# Patient Record
Sex: Male | Born: 1965 | Race: Black or African American | Hispanic: No | Marital: Married | Smoking: Never smoker
Health system: Southern US, Community
[De-identification: ages and names within clinical notes are randomized; demographics above are authoritative.]

## PROBLEM LIST (undated history)

## (undated) DIAGNOSIS — I10 Essential (primary) hypertension: Secondary | ICD-10-CM

## (undated) DIAGNOSIS — E119 Type 2 diabetes mellitus without complications: Secondary | ICD-10-CM

## (undated) DIAGNOSIS — E785 Hyperlipidemia, unspecified: Secondary | ICD-10-CM

## (undated) HISTORY — DX: Hyperlipidemia, unspecified: E78.5

## (undated) HISTORY — DX: Type 2 diabetes mellitus without complications: E11.9

## (undated) HISTORY — DX: Essential (primary) hypertension: I10

---

## 2002-06-03 ENCOUNTER — Emergency Department (HOSPITAL_COMMUNITY): Admission: EM | Admit: 2002-06-03 | Discharge: 2002-06-03 | Payer: Self-pay | Admitting: Emergency Medicine

## 2002-06-14 ENCOUNTER — Emergency Department (HOSPITAL_COMMUNITY): Admission: EM | Admit: 2002-06-14 | Discharge: 2002-06-14 | Payer: Self-pay | Admitting: Emergency Medicine

## 2002-06-14 ENCOUNTER — Encounter: Payer: Self-pay | Admitting: Emergency Medicine

## 2006-06-14 ENCOUNTER — Encounter: Admission: RE | Admit: 2006-06-14 | Discharge: 2006-09-12 | Payer: Self-pay | Admitting: Internal Medicine

## 2006-12-02 ENCOUNTER — Emergency Department (HOSPITAL_COMMUNITY): Admission: EM | Admit: 2006-12-02 | Discharge: 2006-12-02 | Payer: Self-pay | Admitting: Emergency Medicine

## 2009-10-17 ENCOUNTER — Emergency Department (HOSPITAL_COMMUNITY): Admission: EM | Admit: 2009-10-17 | Discharge: 2009-10-17 | Payer: Self-pay | Admitting: Emergency Medicine

## 2011-03-24 LAB — CBC
HCT: 46.5 % (ref 39.0–52.0)
Hemoglobin: 16.3 g/dL (ref 13.0–17.0)
MCHC: 35 g/dL (ref 30.0–36.0)
MCV: 89 fL (ref 78.0–100.0)
Platelets: 223 10*3/uL (ref 150–400)
RBC: 5.23 MIL/uL (ref 4.22–5.81)
RDW: 13.2 % (ref 11.5–15.5)
WBC: 7.9 10*3/uL (ref 4.0–10.5)

## 2011-03-24 LAB — COMPREHENSIVE METABOLIC PANEL
ALT: 24 U/L (ref 0–53)
AST: 25 U/L (ref 0–37)
Albumin: 4.3 g/dL (ref 3.5–5.2)
Alkaline Phosphatase: 66 U/L (ref 39–117)
BUN: 14 mg/dL (ref 6–23)
CO2: 32 mEq/L (ref 19–32)
Calcium: 9.9 mg/dL (ref 8.4–10.5)
Chloride: 91 mEq/L — ABNORMAL LOW (ref 96–112)
Creatinine, Ser: 1.16 mg/dL (ref 0.4–1.5)
GFR calc Af Amer: >60 mL/min (ref >60)
GFR calc non Af Amer: >60 mL/min (ref >60)
Glucose, Bld: 390 mg/dL — ABNORMAL HIGH (ref 70–99)
Potassium: 3.1 mEq/L — ABNORMAL LOW (ref 3.5–5.1)
Sodium: 135 mEq/L (ref 135–145)
Total Bilirubin: 1.1 mg/dL (ref 0.3–1.2)
Total Protein: 8.2 g/dL (ref 6.0–8.3)

## 2011-03-24 LAB — CK TOTAL AND CKMB (NOT AT ARMC)
CK, MB: 1.1 ng/mL (ref 0.3–4.0)
Relative Index: 0.7 (ref 0.0–2.5)
Total CK: 163 U/L (ref 7–232)

## 2011-03-24 LAB — URINALYSIS, ROUTINE W REFLEX MICROSCOPIC
Bilirubin Urine: NEGATIVE
Glucose, UA: >1000 mg/dL — AB
Hgb urine dipstick: NEGATIVE
Ketones, ur: 40 mg/dL — AB
Leukocytes, UA: NEGATIVE
Nitrite: NEGATIVE
Protein, ur: 30 mg/dL — AB
Specific Gravity, Urine: 1.042 — ABNORMAL HIGH (ref 1.005–1.030)
Urobilinogen, UA: 0.2 mg/dL (ref 0.0–1.0)
pH: 5.5 (ref 5.0–8.0)

## 2011-03-24 LAB — HEMOGLOBIN A1C
Hgb A1c MFr Bld: 11.7 % — ABNORMAL HIGH (ref 4.6–6.1)
Mean Plasma Glucose: 289 mg/dL

## 2011-03-24 LAB — LIPID PANEL
Cholesterol: 215 mg/dL — ABNORMAL HIGH (ref 0–200)
HDL: 45 mg/dL (ref >39)
LDL Cholesterol: 127 mg/dL — ABNORMAL HIGH (ref 0–99)
Total CHOL/HDL Ratio: 4.8 RATIO
Triglycerides: 214 mg/dL — ABNORMAL HIGH (ref <150)
VLDL: 43 mg/dL — ABNORMAL HIGH (ref 0–40)

## 2011-03-24 LAB — GLUCOSE, CAPILLARY
Glucose-Capillary: 258 mg/dL — ABNORMAL HIGH (ref 70–99)
Glucose-Capillary: 382 mg/dL — ABNORMAL HIGH (ref 70–99)

## 2011-03-24 LAB — TROPONIN I: Troponin I: 0.04 ng/mL (ref 0.00–0.06)

## 2011-03-24 LAB — URINE MICROSCOPIC-ADD ON

## 2011-03-24 LAB — PROTIME-INR
INR: 0.98 (ref 0.00–1.49)
Prothrombin Time: 12.9 seconds (ref 11.6–15.2)

## 2011-03-24 LAB — APTT: aPTT: 24 seconds (ref 24–37)

## 2015-05-20 ENCOUNTER — Ambulatory Visit (INDEPENDENT_AMBULATORY_CARE_PROVIDER_SITE_OTHER): Payer: Self-pay | Admitting: Family Medicine

## 2015-05-20 VITALS — BP 128/80 | HR 95 | Temp 98.7°F | Resp 17 | Ht 72.5 in | Wt 233.0 lb

## 2015-05-20 DIAGNOSIS — Z0000 Encounter for general adult medical examination without abnormal findings: Secondary | ICD-10-CM

## 2015-05-20 DIAGNOSIS — Z021 Encounter for pre-employment examination: Secondary | ICD-10-CM

## 2015-05-20 NOTE — Progress Notes (Signed)
[facility] [address], [place] [id number] [phone]  Date:  05/20/2015   Name:  Dylan Abbott   DOB:  August 11, 1966   MRN:  [id number]  PCP:  No primary care provider on file.    Chief Complaint: Annual Exam   History of Present Illness:  Dylan Abbott is a 49 y.o. 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  He [place] in [place]- he was last evaluated a couple of months ago per his report.    [facility] [place]. We were not able to find the phone number to call regarding his medications We think he is on metformin and lisinopril but cannot know for sure He does not use insulin He is OW healthy as far as he knows    There are no active problems to display for this patient.   History reviewed. No pertinent past medical history.  History reviewed. No pertinent past surgical history.  History  Substance Use Topics  . Smoking status: Never Smoker   . Smokeless tobacco: Not on file  . Alcohol Use: Not on file    History reviewed. No pertinent family history.  Not on File  Medication list has been reviewed and updated.  No current outpatient prescriptions on file prior to visit.   No current facility-administered medications on file prior to visit.    Review of Systems:  As per HPI- otherwise negative.   Physical Examination: Filed Vitals:   05/20/15 1341  BP: 146/92  Pulse:   Temp:   Resp:    Filed Vitals:   05/20/15 1339  Height: 6\' 8"  (2.032 m)  Weight: 233 lb 12.8 oz (106.051 kg)   Body mass index is 25.68 kg/(m^2). Ideal Body Weight: Weight in (lb) to have BMI = 25: 227.1  GEN: WDWN, NAD, Non-toxic, A & O x 3, looks well HEENT: Atraumatic, Normocephalic. Neck supple. No masses, No LAD. Ears and Nose: No external deformity. CV: RRR, No M/G/R. No JVD. No thrill. No extra  heart sounds. PULM: CTA B, no wheezes, crackles, rhonchi. No retractions. No resp. distress. No accessory muscle use. ABD: S, NT, ND, +BS. No rebound. No HSM. EXTR: No c/c/e NEURO Normal gait. Normal strength and DTR all extremities.   No inguina; hernia PSYCH: Normally interactive. Conversant. Not depressed or anxious appearing.  Calm demeanor.    Assessment and Plan: Physical exam  1 year DOT card due to controlled DM and HTN Encouraged him to find a new PCP [place]  Signed Abbe AmsterdamJessica Aurore Redinger, MD

## 2016-07-15 ENCOUNTER — Encounter: Payer: Self-pay | Admitting: Urgent Care

## 2016-07-15 ENCOUNTER — Ambulatory Visit (INDEPENDENT_AMBULATORY_CARE_PROVIDER_SITE_OTHER): Payer: Self-pay | Admitting: Urgent Care

## 2016-07-15 VITALS — BP 138/88 | HR 84 | Temp 98.3°F | Resp 17 | Ht 72.5 in | Wt 223.0 lb

## 2016-07-15 DIAGNOSIS — Z024 Encounter for examination for driving license: Secondary | ICD-10-CM

## 2016-07-15 DIAGNOSIS — E785 Hyperlipidemia, unspecified: Secondary | ICD-10-CM

## 2016-07-15 DIAGNOSIS — Z021 Encounter for pre-employment examination: Secondary | ICD-10-CM

## 2016-07-15 DIAGNOSIS — E119 Type 2 diabetes mellitus without complications: Secondary | ICD-10-CM

## 2016-07-15 DIAGNOSIS — I10 Essential (primary) hypertension: Secondary | ICD-10-CM

## 2016-07-15 LAB — POCT GLYCOSYLATED HEMOGLOBIN (HGB A1C): Hemoglobin A1C: 6.5

## 2016-07-15 NOTE — Progress Notes (Signed)
Commercial Driver Medical Examination   Dylan Abbott is a 50 y.o. male who presents today for a DOT physical exam. The patient reports HTN managed with enalipril, HL managed with Simvastatin, Diabetes managed with pioglitazone, glipizide. Denies dizziness, chronic headache, blurred vision, chest pain, shortness of breath, heart racing, palpitations, nausea, vomiting, abdominal pain, hematuria, lower leg swelling. Denies smoking cigarettes or drinking alcohol.   The following portions of the patient's history were reviewed and updated as appropriate: allergies, current medications, past family history, past medical history, past social history and past surgical history.  Objective:   BP 138/88 (BP Location: Right Arm, Patient Position: Sitting, Cuff Size: Normal)   Pulse 84   Temp 98.3 F (36.8 C) (Oral)   Resp 17   Ht 6' 0.5" (1.842 m)   Wt 223 lb (101.2 kg)   SpO2 99%   BMI 29.83 kg/m   Vision/hearing:  Visual Acuity Screening   Right eye Left eye Both eyes  Without correction: 20/20 20/20 20/20   With correction:     Hearing Screening Comments: Peripheral Vision: Right eye 85 degrees. Left eye 85 degrees. The patient can distinguish the colors red, amber and green. The patient was able to hear a forced whisper from L=10 R=10 feet.  Patient can recognize and distinguish among traffic control signals and devices showing standard red, green, and amber colors.  Corrective lenses required: No  Monocular Vision?: No  Hearing aid requirement: No  Physical Exam  Constitutional: He is oriented to person, place, and time. He appears well-developed and well-nourished.  HENT:  TM's intact bilaterally, no effusions or erythema. Nasal turbinates pink and moist, nasal passages patent. No sinus tenderness. Oropharynx clear, mucous membranes moist, dentition in good repair.  Eyes: Conjunctivae and EOM are normal. Pupils are equal, round, and reactive to light. Right eye exhibits no  discharge. Left eye exhibits no discharge. No scleral icterus.  Neck: Normal range of motion. Neck supple. No thyromegaly present.  Cardiovascular: Normal rate, regular rhythm and intact distal pulses.  Exam reveals no gallop and no friction rub.   No murmur heard. Pulmonary/Chest: No stridor. No respiratory distress. He has no wheezes. He has no rales.  Abdominal: Soft. Bowel sounds are normal. He exhibits no distension and no mass. There is no tenderness.  Musculoskeletal: Normal range of motion. He exhibits no edema or tenderness.  Lymphadenopathy:    He has no cervical adenopathy.  Neurological: He is alert and oriented to person, place, and time. He has normal reflexes.  Skin: Skin is warm and dry. No rash noted. No erythema. No pallor.  Psychiatric: He has a normal mood and affect.   Labs: Comments: SPGR:1.025,GLU:neg,BLOOD:neg,PRO:neg  Results for orders placed or performed in visit on 07/15/16 (from the past 24 hour(s))  POCT glycosylated hemoglobin (Hb A1C)     Status: None   Collection Time: 07/15/16  6:13 PM  Result Value Ref Range   Hemoglobin A1C 6.5     Assessment:    Healthy male exam.  Meets standards, but periodic monitoring required due to HTN, diabetes, HL.  Driver qualified only for 1 year.    Plan:   Medical examiners certificate completed and printed. Return as needed.

## 2016-07-15 NOTE — Patient Instructions (Addendum)
Keeping you healthy  Get these tests  Blood pressure- Have your blood pressure checked once a year by your healthcare provider.  Normal blood pressure is 120/80  Weight- Have your body mass index (BMI) calculated to screen for obesity.  BMI is a measure of body fat based on height and weight. You can also calculate your own BMI at [email or web address].com/bmi/.  Cholesterol- Have your cholesterol checked every year.  Diabetes- Have your blood sugar checked regularly if you have high blood pressure, high cholesterol, have a family history of diabetes or if you are overweight.  Screening for Colon Cancer- Colonoscopy starting at age 50.  Screening may begin sooner depending on your family history and other health conditions. Follow up colonoscopy as directed by your Gastroenterologist.  Screening for Prostate Cancer- Both blood work (PSA) and a rectal exam help screen for Prostate Cancer.  Screening begins at age 40 with African-American men and at age 50 with Caucasian men.  Screening may begin sooner depending on your family history.  Take these medicines  Aspirin- One aspirin daily can help prevent Heart disease and Stroke.  Flu shot- Every fall.  Tetanus- Every 10 years.  Zostavax- Once after the age of 60 to prevent Shingles.  Pneumonia shot- Once after the age of 65; if you are younger than 65, ask your healthcare provider if you need a Pneumonia shot.  Take these steps  Don't smoke- If you do smoke, talk to your doctor about quitting.  For tips on how to quit, go to www.smokefree.gov or call 1-800-QUIT-NOW.  Be physically active- Exercise 5 days a week for at least 30 minutes.  If you are not already physically active start slow and gradually work up to 30 minutes of moderate physical activity.  Examples of moderate activity include walking briskly, mowing the yard, dancing, swimming, bicycling, etc.  Eat a healthy diet- Eat a variety of healthy food such as fruits, vegetables, low  fat milk, low fat cheese, yogurt, lean meant, poultry, fish, beans, tofu, etc. For more information go to www.thenutritionsource.org  Drink alcohol in moderation- Limit alcohol intake to less than two drinks a day. Never drink and drive.  Dentist- Brush and floss twice daily; visit your dentist twice a year.  Depression- Your emotional health is as important as your physical health. If you're feeling down, or losing interest in things you would normally enjoy please talk to your healthcare provider.  Eye exam- Visit your eye doctor every year.  Safe sex- If you may be exposed to a sexually transmitted infection, use a condom.  Seat belts- Seat belts can save your life; always wear one.  Smoke/Carbon Monoxide detectors- These detectors need to be installed on the appropriate level of your home.  Replace batteries at least once a year.  Skin cancer- When out in the sun, cover up and use sunscreen 15 SPF or higher.  Violence- If anyone is threatening you, please tell your healthcare provider.  Living Will/ Health care power of attorney- Speak with your healthcare provider and family.    IF you received an x-ray today, you will receive an invoice from [facility]. Please contact  Radiology at [phone] with questions or concerns regarding your invoice.   IF you received labwork today, you will receive an invoice from [facility]. Please contact [facility] at [phone] with questions or concerns regarding your invoice.   Our billing staff will not be able to assist you with questions regarding bills from these companies.    You will be contacted with the lab results as soon as they are available. The fastest way to get your results is to activate your My Chart account. Instructions are located on the last page of this paperwork. If you have not heard from us regarding the results in 2 weeks, please contact this office.      

## 2016-07-21 NOTE — Progress Notes (Signed)
Medical screening examination/treatment/procedure(s) were performed by a resident physician or non-physician practitioner and as the supervising physician I was immediately available for consultation/collaboration.  Mariette Cowley, MD  

## 2017-11-13 DIAGNOSIS — E119 Type 2 diabetes mellitus without complications: Secondary | ICD-10-CM | POA: Insufficient documentation

## 2017-11-13 DIAGNOSIS — E785 Hyperlipidemia, unspecified: Secondary | ICD-10-CM | POA: Insufficient documentation

## 2017-11-13 DIAGNOSIS — I10 Essential (primary) hypertension: Secondary | ICD-10-CM | POA: Insufficient documentation

## 2018-08-21 ENCOUNTER — Ambulatory Visit (INDEPENDENT_AMBULATORY_CARE_PROVIDER_SITE_OTHER): Payer: PRIVATE HEALTH INSURANCE | Admitting: Family Medicine

## 2018-08-21 ENCOUNTER — Encounter: Payer: Self-pay | Admitting: Family Medicine

## 2018-08-21 ENCOUNTER — Other Ambulatory Visit: Payer: Self-pay

## 2018-08-21 VITALS — BP 152/83 | HR 68 | Temp 98.0°F | Resp 16 | Ht 72.24 in | Wt 241.0 lb

## 2018-08-21 DIAGNOSIS — E119 Type 2 diabetes mellitus without complications: Secondary | ICD-10-CM

## 2018-08-21 DIAGNOSIS — I10 Essential (primary) hypertension: Secondary | ICD-10-CM

## 2018-08-21 DIAGNOSIS — R7989 Other specified abnormal findings of blood chemistry: Secondary | ICD-10-CM

## 2018-08-21 DIAGNOSIS — E785 Hyperlipidemia, unspecified: Secondary | ICD-10-CM

## 2018-08-21 DIAGNOSIS — R9431 Abnormal electrocardiogram [ECG] [EKG]: Secondary | ICD-10-CM

## 2018-08-21 LAB — POCT GLYCOSYLATED HEMOGLOBIN (HGB A1C): Hemoglobin A1C: 8.5 % — AB (ref 4.0–5.6)

## 2018-08-21 MED ORDER — PIOGLITAZONE HCL 15 MG PO TABS: 15.0000 mg | ORAL_TABLET | Freq: Every day | ORAL | 1 refills | Status: DC

## 2018-08-21 MED ORDER — GLIPIZIDE ER 10 MG PO TB24: 10.0000 mg | ORAL_TABLET | Freq: Every day | ORAL | 1 refills | Status: DC

## 2018-08-21 MED ORDER — METFORMIN HCL 1000 MG PO TABS: ORAL_TABLET | ORAL | 1 refills | Status: DC

## 2018-08-21 MED ORDER — NIFEDIPINE ER OSMOTIC RELEASE 90 MG PO TB24: 90.0000 mg | ORAL_TABLET | Freq: Every day | ORAL | 1 refills | Status: DC

## 2018-08-21 MED ORDER — SIMVASTATIN 40 MG PO TABS: 40.0000 mg | ORAL_TABLET | Freq: Every day | ORAL | 1 refills | Status: DC

## 2018-08-21 MED ORDER — METFORMIN HCL 500 MG PO TABS: ORAL_TABLET | ORAL | 1 refills | Status: DC

## 2018-08-21 MED ORDER — ENALAPRIL MALEATE 10 MG PO TABS: 10.0000 mg | ORAL_TABLET | Freq: Every day | ORAL | 0 refills | Status: DC

## 2018-08-21 NOTE — Progress Notes (Signed)
Subjective:  By signing my name below, I, Dylan Abbott, attest that this documentation has been prepared under the direction and in the presence of Shade Flood, MD Electronically Signed: Charline Bills, ED Scribe 08/21/2018 at 12:35 PM.   Patient ID: Dylan Abbott, male    DOB: 11-23-1966, 52 y.o.   MRN: [id number]  Chief Complaint  Patient presents with  . Establish Care    pt need a pcp to manage his DM  . Diabetes   HPI Dylan Abbott is a 52 y.o. male who presents to Primary Care at [facility] to establish care. H/o DM. Last A1C from [facility] in 2017 7.3, down to 6.5 in 06/2016. H/o hyperlipidemia and HTN. Pt is fasting at this visit. 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   DM Pt recently moved here from [place]. States he last had his blood glucose monitored ~3 months ago. Takes metformin 500 mg bid, glipizide 10 mg qd, Actos 15 mg qd. He is low on meds but has not missed any doses yet. Denies any side-effects. Last saw optho: 07/2017. Denies h/o retinopathy.  HTN Enalapril 10 mg qd and procardia 10 mg tid. - Denies any side-effects.  Hyperlipidemia Takes zocor 10 mg qd.  H/o Low Vit D Unknown reading. Pt has been on 50,000 units/wk x 3 months.  Abnormal EKG At DOT physical on 8/15. Sinus rhythm with first degree block. Elevation with non-specific ST changes V2-V5, appears to be slightly more prominent than in 2018. Denies cp on 8/15, at this time or with doing construction for work, chest tightness, sob, lightheadedness or dizziness.  There are no active problems to display for this patient.  Past Medical History:  Diagnosis Date  . Diabetes mellitus without complication (HCC)    History reviewed. No pertinent surgical history. No Known Allergies Prior to Admission medications   Medication Sig Start Date End Date Taking? Authorizing Provider  enalapril (VASOTEC) 10 MG tablet Take 10 mg by mouth daily.    [provider]  glipiZIDE (GLUCOTROL XL) 10 MG 24 hr tablet Take 10 mg by mouth daily with breakfast.    [provider]  NIFEdipine (PROCARDIA) 10 MG capsule Take 10 mg by mouth 3 (three) times daily.    [provider]  pioglitazone (ACTOS) 15 MG tablet Take 15 mg by mouth daily.    [provider]  simvastatin (ZOCOR) 10 MG tablet Take 10 mg by mouth daily.    [provider]   Social History   Socioeconomic History  . Marital status: Married    Spouse name: Not on file  . Number of children: Not on file  . Years of education: Not on file  . Highest education level: Not on file  Occupational History  . Not on file  Social Needs  . Financial resource strain: Not on file  . Food insecurity:    Worry: Not on file    Inability: Not on file  . Transportation needs:    Medical: Not on file    Non-medical: Not on file  Tobacco Use  . Smoking status: Never Smoker  . Smokeless tobacco: Never Used  Substance and Sexual Activity  . Alcohol use: Not on file  . Drug use: Not on file  . Sexual activity: Not on file  Lifestyle  . Physical activity:    Days per week: Not on file    Minutes per session: Not on file  .  Stress: Not on file  Relationships  . Social connections:    Talks on phone: Not on file    Gets together: Not on file    Attends religious service: Not on file    Active member of club or organization: Not on file    Attends meetings of clubs or organizations: Not on file    Relationship status: Not on file  . Intimate partner violence:    Fear of current or ex partner: Not on file    Emotionally abused: Not on file    Physically abused: Not on file    Forced sexual activity: Not on file  Other Topics Concern  . Not on file  Social History Narrative  . Not on file   Review of Systems  Constitutional: Negative for fatigue and unexpected weight change.  Eyes: Negative for visual disturbance.  Respiratory: Negative for cough, chest  tightness and shortness of breath.   Cardiovascular: Negative for chest pain, palpitations and leg swelling.  Gastrointestinal: Negative for abdominal pain and blood in stool.  Musculoskeletal: Negative for myalgias.  Neurological: Negative for dizziness, light-headedness and headaches.      Objective:   Physical Exam  Constitutional: He is oriented to person, place, and time. He appears well-developed and well-nourished.  HENT:  Head: Normocephalic and atraumatic.  Eyes: Pupils are equal, round, and reactive to light. EOM are normal.  Neck: No JVD present. Carotid bruit is not present.  Cardiovascular: Normal rate, regular rhythm and normal heart sounds.  No murmur heard. Pulmonary/Chest: Effort normal and breath sounds normal. He has no rales.  Musculoskeletal: He exhibits no edema.  Neurological: He is alert and oriented to person, place, and time.  Skin: Skin is warm and dry.  Psychiatric: He has a normal mood and affect.  Vitals reviewed.  Vitals:   08/21/18 1234  BP: (!) 152/83  Pulse: 68  Resp: 16  Temp: 98 F (36.7 C)  TempSrc: Oral  SpO2: 97%  Weight: 241 lb (109.3 kg)  Height: 6' 0.24" (1.835 m)   Results for orders placed or performed in visit on 08/21/18  POCT glycosylated hemoglobin (Hb A1C)  Result Value Ref Range   Hemoglobin A1C 8.5 (A) 4.0 - 5.6 %   HbA1c POC (<> result, manual entry)     HbA1c, POC (prediabetic range)     HbA1c, POC (controlled diabetic range)        Assessment & Plan:   Dylan Abbott is a 52 y.o. male Type 2 diabetes mellitus without complication, without long-term current use of insulin (HCC) - Plan: Comprehensive metabolic panel, Microalbumin/Creatinine Ratio, Urine, glipiZIDE (GLUCOTROL XL) 10 MG 24 hr tablet, Ambulatory referral to Ophthalmology, POCT glycosylated hemoglobin (Hb A1C), metFORMIN (GLUCOPHAGE) 1000 MG tablet, DISCONTINUED: metFORMIN (GLUCOPHAGE) 500 MG tablet  -Uncontrolled, will increase metformin to 1000 mg  twice daily as he reportedly had tolerated that dose previously.  Obtain records from previous provider and follow-up in 1 month.  No change glipizide or actos at this time.   Low vitamin D level - Plan: Vitamin D, 25-hydroxy  -Status post prescription dosing, check level to see if that is needed at this time.  2000 units/day may be sufficient.  Essential hypertension - Plan: enalapril (VASOTEC) 10 MG tablet, NIFEdipine (PROCARDIA XL) 90 MG 24 hr tablet  -Borderline elevated, plan for recheck at next visit.  Continue same regimen for now, previous records request  Hyperlipidemia, unspecified hyperlipidemia type - Plan: Lipid panel, Comprehensive metabolic panel, simvastatin (ZOCOR) 40  MG tablet  -Tolerating statin, check labs, continue same dose  Nonspecific abnormal electrocardiogram (ECG) (EKG) - Plan: Ambulatory referral to Cardiology  -Nonspecific ST changes precordial leads, denies chest pain or symptoms.  Appears similar but possibly more prominent compared to 2018 EKG.  Will refer to cardiology to discuss further work-up if needed given history of diabetes, hyperlipidemia, hypertension.  ER/911 precautions discussed if symptoms.   Meds ordered this encounter  Medications  . enalapril (VASOTEC) 10 MG tablet    Sig: Take 1 tablet (10 mg total) by mouth daily.    Dispense:  90 tablet    Refill:  0  . glipiZIDE (GLUCOTROL XL) 10 MG 24 hr tablet    Sig: Take 1 tablet (10 mg total) by mouth daily with breakfast.    Dispense:  90 tablet    Refill:  1  . DISCONTD: metFORMIN (GLUCOPHAGE) 500 MG tablet    Sig: 1 tablet twice a day by oral route    Dispense:  180 tablet    Refill:  1  . simvastatin (ZOCOR) 40 MG tablet    Sig: Take 1 tablet (40 mg total) by mouth daily at 6 PM.    Dispense:  90 tablet    Refill:  1  . NIFEdipine (PROCARDIA XL) 90 MG 24 hr tablet    Sig: Take 1 tablet (90 mg total) by mouth daily.    Dispense:  90 tablet    Refill:  1  . metFORMIN (GLUCOPHAGE) 1000 MG  tablet    Sig: 1 tablet twice a day by oral route    Dispense:  180 tablet    Refill:  1   Patient Instructions    Thank you for coming in today. We will try to obtain records from prior provider to review at next visit in 1 month. No med changes for now. A1c slightly high - can increase metformin back to 1000mg  twice per day for now   I referred you to eye specialist.   I will refer you to cardiologist to discuss the EKG. If any chest pains - call 911/go to emergency room.   I will check vitamin D level to see if prescription dose is still needed. Ok to take 2000 units vitamin D over the counter daily for now.   If you have lab work done today you will be contacted with your lab results within the next 2 weeks.  If you have not heard from [place] then please contact us. The fastest way to get your results is to register for My Chart.    IF you received an x-ray today, you will receive an invoice from [facility]. Please contact [facility] at [phone] with questions or concerns regarding your invoice.   IF you received labwork today, you will receive an invoice from [facility]. Please contact [facility] at [phone] with questions or concerns regarding your invoice.   Our billing staff will not be able to assist you with questions regarding bills from these companies.  You will be contacted with the lab results as soon as they are available. The fastest way to get your results is to activate your My Chart account. Instructions are located on the last page of this paperwork. If you have not heard from [place] regarding the results in 2 weeks, please contact this office.       I personally performed the services described in this documentation, which was scribed in my presence. The recorded information has been reviewed and considered  for accuracy and completeness, addended by me as needed, and agree with information above.  Signed,   Meredith Staggers, MD Primary Care at  [facility].  08/21/18 1:28 PM

## 2018-08-21 NOTE — Patient Instructions (Addendum)
  Thank you for coming in today. We will try to obtain records from prior provider to review at next visit in 1 month. No med changes for now. A1c slightly high - can increase metformin back to 1000mg  twice per day for now   I referred you to eye specialist.   I will refer you to cardiologist to discuss the EKG. If any chest pains - call 911/go to emergency room.   I will check vitamin D level to see if prescription dose is still needed. Ok to take 2000 units vitamin D over the counter daily for now.   If you have lab work done today you will be contacted with your lab results within the next 2 weeks.  If you have not heard from [place] then please contact us. The fastest way to get your results is to register for My Chart.    IF you received an x-ray today, you will receive an invoice from [facility]. Please contact [facility] at [phone] with questions or concerns regarding your invoice.   IF you received labwork today, you will receive an invoice from [place]. Please contact [facility] at [phone] with questions or concerns regarding your invoice.   Our billing staff will not be able to assist you with questions regarding bills from these companies.  You will be contacted with the lab results as soon as they are available. The fastest way to get your results is to activate your My Chart account. Instructions are located on the last page of this paperwork. If you have not heard from [place] regarding the results in 2 weeks, please contact this office.

## 2018-08-22 LAB — VITAMIN D 25 HYDROXY (VIT D DEFICIENCY, FRACTURES): Vit D, 25-Hydroxy: 44.5 ng/mL (ref 30.0–100.0)

## 2018-08-22 LAB — COMPREHENSIVE METABOLIC PANEL
ALT: 17 IU/L (ref 0–44)
AST: 21 IU/L (ref 0–40)
Albumin/Globulin Ratio: 1.6 (ref 1.2–2.2)
Albumin: 4.9 g/dL (ref 3.5–5.5)
Alkaline Phosphatase: 43 IU/L (ref 39–117)
BUN/Creatinine Ratio: 17 (ref 9–20)
BUN: 23 mg/dL (ref 6–24)
Bilirubin Total: 0.4 mg/dL (ref 0.0–1.2)
CO2: 25 mmol/L (ref 20–29)
Calcium: 10.2 mg/dL (ref 8.7–10.2)
Chloride: 98 mmol/L (ref 96–106)
Creatinine, Ser: 1.35 mg/dL — ABNORMAL HIGH (ref 0.76–1.27)
GFR calc Af Amer: 69 mL/min/{1.73_m2} (ref >59)
GFR calc non Af Amer: 60 mL/min/{1.73_m2} (ref >59)
Globulin, Total: 3.1 g/dL (ref 1.5–4.5)
Glucose: 89 mg/dL (ref 65–99)
Potassium: 3.3 mmol/L — ABNORMAL LOW (ref 3.5–5.2)
Sodium: 141 mmol/L (ref 134–144)
Total Protein: 8 g/dL (ref 6.0–8.5)

## 2018-08-22 LAB — LIPID PANEL
Chol/HDL Ratio: 2.2 ratio (ref 0.0–5.0)
Cholesterol, Total: 155 mg/dL (ref 100–199)
HDL: 72 mg/dL (ref >39)
LDL Calculated: 72 mg/dL (ref 0–99)
Triglycerides: 54 mg/dL (ref 0–149)
VLDL Cholesterol Cal: 11 mg/dL (ref 5–40)

## 2018-08-22 LAB — MICROALBUMIN / CREATININE URINE RATIO
Creatinine, Urine: 203.5 mg/dL
Microalb/Creat Ratio: 29.7 mg/g creat (ref 0.0–30.0)
Microalbumin, Urine: 60.5 ug/mL

## 2018-12-06 NOTE — Progress Notes (Deleted)
Referring-Dylan Neva SeatGreene, MD Reason for referral-abnormal electrocardiogram  HPI: 52 year old male for evaluation of abnormal electrocardiogram at request of Dylan KinnierJeffrey Green, MD. Electrocardiogram described as sinus with first-degree AV block and nonspecific changes in V2 through V5.  Tracing not available.  Current Outpatient Medications  Medication Sig Dispense Refill  . enalapril (VASOTEC) 10 MG tablet Take 1 tablet (10 mg total) by mouth daily. 90 tablet 0  . glipiZIDE (GLUCOTROL XL) 10 MG 24 hr tablet Take 1 tablet (10 mg total) by mouth daily with breakfast. 90 tablet 1  . metFORMIN (GLUCOPHAGE) 1000 MG tablet 1 tablet twice a day by oral route 180 tablet 1  . NIFEdipine (PROCARDIA XL) 90 MG 24 hr tablet Take 1 tablet (90 mg total) by mouth daily. 90 tablet 1  . pioglitazone (ACTOS) 15 MG tablet Take 1 tablet (15 mg total) by mouth daily. 90 tablet 1  . simvastatin (ZOCOR) 40 MG tablet Take 1 tablet (40 mg total) by mouth daily at 6 PM. 90 tablet 1   No current facility-administered medications for this visit.     No Known Allergies  Past Medical History:  Diagnosis Date  . Diabetes mellitus without complication (HCC)   . Hyperlipidemia   . Hypertension     No past surgical history on file.  Social History   Socioeconomic History  . Marital status: Married    Spouse name: Not on file  . Number of children: Not on file  . Years of education: Not on file  . Highest education level: Not on file  Occupational History  . Not on file  Social Needs  . Financial resource strain: Not on file  . Food insecurity:    Worry: Not on file    Inability: Not on file  . Transportation needs:    Medical: Not on file    Non-medical: Not on file  Tobacco Use  . Smoking status: Never Smoker  . Smokeless tobacco: Never Used  Substance and Sexual Activity  . Alcohol use: Not on file  . Drug use: Not on file  . Sexual activity: Not on file  Lifestyle  . Physical activity:   Days per week: Not on file    Minutes per session: Not on file  . Stress: Not on file  Relationships  . Social connections:    Talks on phone: Not on file    Gets together: Not on file    Attends religious service: Not on file    Active member of club or organization: Not on file    Attends meetings of clubs or organizations: Not on file    Relationship status: Not on file  . Intimate partner violence:    Fear of current or ex partner: Not on file    Emotionally abused: Not on file    Physically abused: Not on file    Forced sexual activity: Not on file  Other Topics Concern  . Not on file  Social History Narrative  . Not on file    No family history on file.  ROS: no fevers or chills, productive cough, hemoptysis, dysphasia, odynophagia, melena, hematochezia, dysuria, hematuria, rash, seizure activity, orthopnea, PND, pedal edema, claudication. Remaining systems are negative.  Physical Exam:   There were no vitals taken for this visit.  General:  Well developed/well nourished in NAD Skin warm/dry Patient not depressed No peripheral clubbing Back-normal HEENT-normal/normal eyelids Neck supple/normal carotid upstroke bilaterally; no bruits; no JVD; no thyromegaly chest - CTA/ normal expansion  CV - RRR/normal S1 and S2; no murmurs, rubs or gallops;  PMI nondisplaced Abdomen -NT/ND, no HSM, no mass, + bowel sounds, no bruit 2+ femoral pulses, no bruits Ext-no edema, chords, 2+ DP Neuro-grossly nonfocal  ECG - personally reviewed  A/P  1  Dylan MillersBrian Sherica Paternostro, MD

## 2018-12-18 ENCOUNTER — Ambulatory Visit: Payer: Self-pay | Admitting: Cardiology

## 2018-12-25 ENCOUNTER — Telehealth: Payer: Self-pay | Admitting: Family Medicine

## 2018-12-25 ENCOUNTER — Telehealth: Payer: Self-pay

## 2018-12-25 NOTE — Telephone Encounter (Signed)
Spoke to [place] at [facility] requested all EKG's to be faxed before patient's appointment with Dr.Jordan 12/26/18.

## 2018-12-25 NOTE — Progress Notes (Signed)
Cardiology Office Note   Date:  12/26/2018   ID:  Dylan Latlexander Mole, DOB Oct 16, 1966, MRN [id number]  PCP:  Shade FloodGreene, Jeffrey R, MD  Cardiologist:   Peter SwazilandJordan, MD   Chief Complaint  Patient presents with  . Follow-up    ABN EKG.      History of Present Illness: Dylan Abbott is a 53 y.o. male who is seen at the request of Dr. Neva SeatGreene for evaluation of abnormal Ecg. He has a history of DM type 2, HTN, and HLD. The patient reports he had an Ecg as part of a job physical 1-2 years ago. Recommended follow up with primary care. Saw Dr Neva SeatGreene recently and Ecg was noted to be abnormal with some T wave changes more prominent in the precordial leads. Unfortunately I do not have either of these tracings.  The patient denies any cardiac problems. He specifically denies any dyspnea, chest pain, palpitations, edema, PND, claudication. He has a history of DM, HTN, and hypercholesterolemia. Admits his diet is not good and eats pretty much anything.     Past Medical History:  Diagnosis Date  . Diabetes mellitus without complication (HCC)   . Hyperlipidemia   . Hypertension     History reviewed. No pertinent surgical history.   Current Outpatient Medications  Medication Sig Dispense Refill  . enalapril (VASOTEC) 10 MG tablet Take 1 tablet (10 mg total) by mouth daily. 90 tablet 0  . glipiZIDE (GLUCOTROL XL) 10 MG 24 hr tablet Take 1 tablet (10 mg total) by mouth daily with breakfast. 90 tablet 1  . metFORMIN (GLUCOPHAGE) 1000 MG tablet 1 tablet twice a day by oral route 180 tablet 1  . NIFEdipine (PROCARDIA XL) 90 MG 24 hr tablet Take 1 tablet (90 mg total) by mouth daily. 90 tablet 1  . pioglitazone (ACTOS) 15 MG tablet Take 1 tablet (15 mg total) by mouth daily. 90 tablet 1  . simvastatin (ZOCOR) 40 MG tablet Take 1 tablet (40 mg total) by mouth daily at 6 PM. 90 tablet 1   No current facility-administered medications for this visit.     Allergies:   Patient has no known allergies.     Social History:  The patient  reports that he has never smoked. He has never used smokeless tobacco.   Family History:  The patient's family history is negative for cardiovascular disease.    ROS:  Please see the history of present illness.   Otherwise, review of systems are positive for none.   All other systems are reviewed and negative.    PHYSICAL EXAM: VS:  BP 130/78 (BP Location: Left Arm, Patient Position: Sitting, Cuff Size: Large)   Pulse 68   Ht 6' (1.829 m)   Wt 245 lb (111.1 kg)   BMI 33.23 kg/m  , BMI Body mass index is 33.23 kg/m. GEN: Well nourished, well developed BM, in no acute distress  HEENT: normal  Neck: no JVD, carotid bruits, or masses Cardiac: RRR; no murmurs, rubs, or gallops,no edema  Respiratory:  clear to auscultation bilaterally, normal work of breathing GI: soft, nontender, nondistended, + BS MS: no deformity or atrophy  Skin: warm and dry, no rash Neuro:  Strength and sensation are intact Psych: euthymic mood, full affect   EKG:  EKG is ordered today. The ekg ordered today demonstrates NSR rate 68 with first degree AV block. There is mild ST elevation with some T wave abnormality particularly in leads V2-6. I have personally reviewed and interpreted  this study.    Recent Labs: 08/21/2018: ALT 17; BUN 23; Creatinine, Ser 1.35; Potassium 3.3; Sodium 141    Lipid Panel    Component Value Date/Time   CHOL 155 08/21/2018 1417   TRIG 54 08/21/2018 1417   HDL 72 08/21/2018 1417   CHOLHDL 2.2 08/21/2018 1417   CHOLHDL 4.8 10/17/2009 0639   VLDL 43 (H) 10/17/2009 0639   LDLCALC 72 08/21/2018 1417      Wt Readings from Last 3 Encounters:  12/26/18 245 lb (111.1 kg)  08/21/18 241 lb (109.3 kg)  07/15/16 223 lb (101.2 kg)      Other studies Reviewed: Additional studies/ records that were reviewed today include:none   ASSESSMENT AND PLAN:  1.  Abnormal Ecg. I suspect this is an early repolarization pattern and therefore a normal  variant. Although he has a number of CV risk factors he is completely asymptomatic. For this reason I do not feel that further evaluation is needed. Continue to work on risk factor modification. If he should develop cardiac symptoms such as chest pain or SOB in the future we will need to reevaluate.  2. HTN controlled 3. HLD under good control on statin 4. DM type 2. Last A1c 8.5. needs to do better with dietary modification. Discussed low carb diet. Follow up with primary care. 5. CKD last creatinine 1.35 probably related to DM and HTN. Follow up with primary care.    Current medicines are reviewed at length with the patient today.  The patient does not have concerns regarding medicines.  The following changes have been made:  no change  Labs/ tests ordered today include:  No orders of the defined types were placed in this encounter.    Disposition:   FU PRN  Signed, Peter Swaziland, MD  12/26/2018 5:21 PM    [facility] [address] [place], [place], [address] Phone [phone], Fax [phone]

## 2018-12-25 NOTE — Telephone Encounter (Signed)
Copied from CRM [id number]. Topic: Referral - Medical Records >> Dec 25, 2018  9:53 AM Maia Petties wrote: Reason for CRM: Please send copies of all EKGs as none are in Epic. Dr. Neva Seat referred pt to cardiology. Please fax to # [phone] >> Dec 25, 2018  4:13 PM Tamela Oddi wrote: Elnita Maxwell, from Dr. Elvis Coil office called again requesting copies of patient's EKG.  Please fax to [phone] before patient's appointment tomorrow at 3:30, if possible.

## 2018-12-25 NOTE — Telephone Encounter (Signed)
Pt is scheduled with [facility] for 12/26/2018 but they are requesting pt abnormal ekg which the only ekg that was seen was back in 2010. They may not see pt until they have an ekg to go by..   Please advise

## 2018-12-26 ENCOUNTER — Ambulatory Visit (INDEPENDENT_AMBULATORY_CARE_PROVIDER_SITE_OTHER): Payer: 59 | Admitting: Cardiology

## 2018-12-26 ENCOUNTER — Encounter: Payer: Self-pay | Admitting: Cardiology

## 2018-12-26 VITALS — BP 130/78 | HR 68 | Ht 72.0 in | Wt 245.0 lb

## 2018-12-26 DIAGNOSIS — R9431 Abnormal electrocardiogram [ECG] [EKG]: Secondary | ICD-10-CM | POA: Diagnosis not present

## 2018-12-26 DIAGNOSIS — I10 Essential (primary) hypertension: Secondary | ICD-10-CM | POA: Diagnosis not present

## 2018-12-26 DIAGNOSIS — N182 Chronic kidney disease, stage 2 (mild): Secondary | ICD-10-CM | POA: Diagnosis not present

## 2018-12-26 DIAGNOSIS — E118 Type 2 diabetes mellitus with unspecified complications: Secondary | ICD-10-CM | POA: Diagnosis not present

## 2018-12-26 NOTE — Telephone Encounter (Signed)
Dylan Abbott calling back to check status of EKG fax stating that office needs them faxed before 3:30 today. Cheryl disconnected call while on hold with office.

## 2018-12-26 NOTE — Telephone Encounter (Signed)
I do not see it when reviewing media tab, or care everywhere. Was seen by cardiology today with repeat EKG.

## 2018-12-26 NOTE — Telephone Encounter (Signed)
No EKG in Epic.  Reviewed appt r/t referral.  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  Possibly had paperwork with him showing previous EKG.  Attempted to call pt to clarify.  Number disconnected.  Spoke with Elnita Maxwell at cardiology.  If medical records brought in from previous exams, would have been sent to scan.

## 2019-03-09 ENCOUNTER — Ambulatory Visit: Payer: PRIVATE HEALTH INSURANCE | Admitting: Family Medicine

## 2019-03-09 ENCOUNTER — Other Ambulatory Visit: Payer: Self-pay

## 2019-03-09 ENCOUNTER — Encounter: Payer: Self-pay | Admitting: Family Medicine

## 2019-03-09 VITALS — BP 160/94 | HR 70 | Temp 98.0°F | Resp 18 | Ht 72.0 in | Wt 242.4 lb

## 2019-03-09 DIAGNOSIS — E785 Hyperlipidemia, unspecified: Secondary | ICD-10-CM

## 2019-03-09 DIAGNOSIS — I10 Essential (primary) hypertension: Secondary | ICD-10-CM | POA: Diagnosis not present

## 2019-03-09 DIAGNOSIS — E119 Type 2 diabetes mellitus without complications: Secondary | ICD-10-CM | POA: Diagnosis not present

## 2019-03-09 LAB — POCT GLYCOSYLATED HEMOGLOBIN (HGB A1C): Hemoglobin A1C: 9 % — AB (ref 4.0–5.6)

## 2019-03-09 MED ORDER — PIOGLITAZONE HCL 15 MG PO TABS: 15.0000 mg | ORAL_TABLET | Freq: Every day | ORAL | 1 refills | Status: DC

## 2019-03-09 MED ORDER — EXENATIDE ER 2 MG/0.85ML ~~LOC~~ AUIJ: 2.0000 mg | AUTO-INJECTOR | SUBCUTANEOUS | 3 refills | Status: DC

## 2019-03-09 MED ORDER — METFORMIN HCL 1000 MG PO TABS: ORAL_TABLET | ORAL | 1 refills | Status: DC

## 2019-03-09 MED ORDER — SIMVASTATIN 40 MG PO TABS: 40.0000 mg | ORAL_TABLET | Freq: Every day | ORAL | 1 refills | Status: DC

## 2019-03-09 MED ORDER — ENALAPRIL MALEATE 10 MG PO TABS: 10.0000 mg | ORAL_TABLET | Freq: Every day | ORAL | 1 refills | Status: DC

## 2019-03-09 MED ORDER — GLIPIZIDE ER 10 MG PO TB24: 10.0000 mg | ORAL_TABLET | Freq: Two times a day (BID) | ORAL | 1 refills | Status: DC

## 2019-03-09 MED ORDER — NIFEDIPINE ER OSMOTIC RELEASE 90 MG PO TB24: 90.0000 mg | ORAL_TABLET | Freq: Every day | ORAL | 1 refills | Status: DC

## 2019-03-09 NOTE — Patient Instructions (Addendum)
   Start bydureon once a week as discussed If you start having recurring symptoms of low sugars, then stop glipizide    If you have lab work done today you will be contacted with your lab results within the next 2 weeks.  If you have not heard from [place] then please contact us. The fastest way to get your results is to register for My Chart.   IF you received an x-ray today, you will receive an invoice from [facility]. Please contact [facility] at [phone] with questions or concerns regarding your invoice.   IF you received labwork today, you will receive an invoice from [facility]. Please contact [facility] at [phone] with questions or concerns regarding your invoice.   Our billing staff will not be able to assist you with questions regarding bills from these companies.  You will be contacted with the lab results as soon as they are available. The fastest way to get your results is to activate your My Chart account. Instructions are located on the last page of this paperwork. If you have not heard from [place] regarding the results in 2 weeks, please contact this office.

## 2019-03-09 NOTE — Progress Notes (Signed)
3/21/202011:15 AM  Dylan Abbott [birth year], 53 y.o., male [id number]  Chief Complaint  Patient presents with   Medication Refill    all meds     HPI:   Patient is a 53 y.o. male with past medical history significant for DM2, HLP, HTN who presents today for routine followup  Last OV in Sept with Dr Neva Seat  Does not check cbgs He ran out of all medications yesterday He denies any side effects from medications He denies any low cbgs Does not follow diet nor exercise Denies polydipsia, reports polyuria   Wt Readings from Last 3 Encounters:  03/09/19 242 lb 6.4 oz (110 kg)  12/26/18 245 lb (111.1 kg)  08/21/18 241 lb (109.3 kg)    Lab Results  Component Value Date   HGBA1C 8.5 (A) 08/21/2018   HGBA1C 6.5 07/15/2016   HGBA1C (H) 10/17/2009    11.7 (NOTE) The ADA recommends the following therapeutic goal for glycemic control related to Hgb A1c measurement: Goal of therapy: <6.5 Hgb A1c  Reference: American Diabetes Association: Clinical Practice Recommendations 2010, Diabetes Care, 2010, 33: (Suppl  1).   Lab Results  Component Value Date   LDLCALC 72 08/21/2018   CREATININE 1.35 (H) 08/21/2018  GFR 60  Fall Risk  03/09/2019 08/21/2018  Falls in the past year? 0 No  Follow up Falls evaluation completed -     Depression screen [facility] 2/9 03/09/2019 08/21/2018 05/20/2015  Decreased Interest 0 0 0  Down, Depressed, Hopeless 0 0 0  PHQ - 2 Score 0 0 0    No Known Allergies  Prior to Admission medications   Medication Sig Start Date End Date Taking? Authorizing Provider  enalapril (VASOTEC) 10 MG tablet Take 1 tablet (10 mg total) by mouth daily. 08/21/18  Yes Shade Flood, MD  glipiZIDE (GLUCOTROL XL) 10 MG 24 hr tablet Take 1 tablet (10 mg total) by mouth daily with breakfast. Patient taking differently: Take 10 mg by mouth 2 (two) times daily.  08/21/18  Yes Shade Flood, MD  metFORMIN (GLUCOPHAGE) 1000 MG tablet 1 tablet twice a day by oral route 08/21/18  Yes  Shade Flood, MD  NIFEdipine (PROCARDIA XL) 90 MG 24 hr tablet Take 1 tablet (90 mg total) by mouth daily. 08/21/18  Yes Shade Flood, MD  pioglitazone (ACTOS) 15 MG tablet Take 1 tablet (15 mg total) by mouth daily. 08/21/18  Yes Shade Flood, MD  simvastatin (ZOCOR) 40 MG tablet Take 1 tablet (40 mg total) by mouth daily at 6 PM. 08/21/18  Yes Shade Flood, MD    Past Medical History:  Diagnosis Date   Diabetes mellitus without complication (HCC)    Hyperlipidemia    Hypertension     No past surgical history on file.  Social History   Tobacco Use   Smoking status: Never Smoker   Smokeless tobacco: Never Used  Substance Use Topics   Alcohol use: Not on file    No family history on file.  Review of Systems  Constitutional: Negative for chills and fever.  Respiratory: Negative for cough and shortness of breath.   Cardiovascular: Negative for chest pain, palpitations and leg swelling.  Gastrointestinal: Negative for abdominal pain, nausea and vomiting.     OBJECTIVE:  Today's Vitals   03/09/19 1101  BP: (!) 150/98  Pulse: 70  Resp: 18  Temp: 98 F (36.7 C)  TempSrc: Oral  SpO2: 99%  Weight: 242 lb 6.4 oz (110 kg)  Height: 6' (1.829 m)   Body mass index is 32.88 kg/m.   Physical Exam Vitals signs and nursing note reviewed.  Constitutional:      Appearance: He is well-developed.  HENT:     Head: Normocephalic and atraumatic.  Eyes:     Conjunctiva/sclera: Conjunctivae normal.     Pupils: Pupils are equal, round, and reactive to light.  Neck:     Musculoskeletal: Neck supple.  Cardiovascular:     Rate and Rhythm: Normal rate and regular rhythm.     Heart sounds: No murmur. No friction rub. No gallop.   Pulmonary:     Effort: Pulmonary effort is normal.     Breath sounds: Normal breath sounds. No wheezing or rales.  Skin:    General: Skin is warm and dry.  Neurological:     Mental Status: He is alert and oriented to person, place,  and time.     Results for orders placed or performed in visit on 03/09/19 (from the past 24 hour(s))  POCT A1C     Status: Abnormal   Collection Time: 03/09/19 11:35 AM  Result Value Ref Range   Hemoglobin A1C 9.0 (A) 4.0 - 5.6 %   HbA1c POC (<> result, manual entry)     HbA1c, POC (prediabetic range)     HbA1c, POC (controlled diabetic range)      ASSESSMENT and PLAN  1. Type 2 diabetes mellitus without complication, without long-term current use of insulin (HCC) Uncontrolled. Starting GLP1, reviewed r/se/b. Cont metformin, actos and glipizide. Discussed dc glipizide if starts having hypoglycemia. Discussed importance of low carb diet, regular exercise and healthy weight.  - POCT A1C - TSH - pioglitazone (ACTOS) 15 MG tablet; Take 1 tablet (15 mg total) by mouth daily. - metFORMIN (GLUCOPHAGE) 1000 MG tablet; 1 tablet twice a day by oral route - glipiZIDE (GLUCOTROL XL) 10 MG 24 hr tablet; Take 1 tablet (10 mg total) by mouth 2 (two) times daily.  2. Essential hypertension Uncontrolled in setting of being wo meds. Refilled meds. Recheck at next OV - CBC - Care order/instruction: - NIFEdipine (PROCARDIA XL) 90 MG 24 hr tablet; Take 1 tablet (90 mg total) by mouth daily. - enalapril (VASOTEC) 10 MG tablet; Take 1 tablet (10 mg total) by mouth daily.  3. Hyperlipidemia, unspecified hyperlipidemia type Checking labs today, medications will be adjusted as needed.  - Comprehensive metabolic panel - Lipid panel - simvastatin (ZOCOR) 40 MG tablet; Take 1 tablet (40 mg total) by mouth daily at 6 PM.  Other orders - Exenatide ER (BYDUREON BCISE) 2 MG/0.85ML AUIJ; Inject 2 mg into the skin once a week.  Return in about 3 months (around 06/09/2019) for DM2/HTN.    Myles Lipps, MD Primary Care at [facility] [address] [place], [place] [address] Ph.  [phone] Fax [phone]

## 2019-03-10 LAB — CBC
Hematocrit: 40.5 % (ref 37.5–51.0)
Hemoglobin: 14.1 g/dL (ref 13.0–17.7)
MCH: 30.3 pg (ref 26.6–33.0)
MCHC: 34.8 g/dL (ref 31.5–35.7)
MCV: 87 fL (ref 79–97)
Platelets: 184 x10E3/uL (ref 150–450)
RBC: 4.65 x10E6/uL (ref 4.14–5.80)
RDW: 13.7 % (ref 11.6–15.4)
WBC: 6.7 x10E3/uL (ref 3.4–10.8)

## 2019-03-10 LAB — COMPREHENSIVE METABOLIC PANEL
ALT: 17 IU/L (ref 0–44)
AST: 25 IU/L (ref 0–40)
Albumin/Globulin Ratio: 1.8 (ref 1.2–2.2)
Albumin: 4.5 g/dL (ref 3.8–4.9)
Alkaline Phosphatase: 56 IU/L (ref 39–117)
BUN/Creatinine Ratio: 14 (ref 9–20)
BUN: 18 mg/dL (ref 6–24)
Bilirubin Total: 0.5 mg/dL (ref 0.0–1.2)
CO2: 27 mmol/L (ref 20–29)
Calcium: 9.9 mg/dL (ref 8.7–10.2)
Chloride: 95 mmol/L — ABNORMAL LOW (ref 96–106)
Creatinine, Ser: 1.32 mg/dL — ABNORMAL HIGH (ref 0.76–1.27)
GFR calc Af Amer: 71 mL/min/{1.73_m2} (ref >59)
GFR calc non Af Amer: 61 mL/min/{1.73_m2} (ref >59)
Globulin, Total: 2.5 g/dL (ref 1.5–4.5)
Glucose: 185 mg/dL — ABNORMAL HIGH (ref 65–99)
Potassium: 3.7 mmol/L (ref 3.5–5.2)
Sodium: 138 mmol/L (ref 134–144)
Total Protein: 7 g/dL (ref 6.0–8.5)

## 2019-03-10 LAB — LIPID PANEL
Chol/HDL Ratio: 2.8 ratio (ref 0.0–5.0)
Cholesterol, Total: 159 mg/dL (ref 100–199)
HDL: 57 mg/dL
LDL Calculated: 81 mg/dL (ref 0–99)
Triglycerides: 103 mg/dL (ref 0–149)
VLDL Cholesterol Cal: 21 mg/dL (ref 5–40)

## 2019-03-10 LAB — TSH: TSH: 1.33 u[IU]/mL (ref 0.450–4.500)

## 2019-04-02 ENCOUNTER — Encounter: Payer: Self-pay | Admitting: Radiology

## 2019-06-13 ENCOUNTER — Ambulatory Visit: Payer: PRIVATE HEALTH INSURANCE | Admitting: Family Medicine

## 2019-06-14 ENCOUNTER — Ambulatory Visit: Payer: PRIVATE HEALTH INSURANCE | Admitting: Family Medicine

## 2019-06-14 ENCOUNTER — Encounter: Payer: Self-pay | Admitting: Family Medicine

## 2019-07-25 ENCOUNTER — Other Ambulatory Visit: Payer: Self-pay

## 2019-07-25 ENCOUNTER — Ambulatory Visit (INDEPENDENT_AMBULATORY_CARE_PROVIDER_SITE_OTHER): Payer: PRIVATE HEALTH INSURANCE | Admitting: Family Medicine

## 2019-07-25 ENCOUNTER — Encounter: Payer: Self-pay | Admitting: Family Medicine

## 2019-07-25 VITALS — BP 132/77 | HR 76 | Temp 98.3°F | Resp 16 | Wt 241.2 lb

## 2019-07-25 DIAGNOSIS — E785 Hyperlipidemia, unspecified: Secondary | ICD-10-CM

## 2019-07-25 DIAGNOSIS — E1165 Type 2 diabetes mellitus with hyperglycemia: Secondary | ICD-10-CM | POA: Diagnosis not present

## 2019-07-25 DIAGNOSIS — M25511 Pain in right shoulder: Secondary | ICD-10-CM

## 2019-07-25 DIAGNOSIS — Z23 Encounter for immunization: Secondary | ICD-10-CM | POA: Diagnosis not present

## 2019-07-25 DIAGNOSIS — I10 Essential (primary) hypertension: Secondary | ICD-10-CM | POA: Diagnosis not present

## 2019-07-25 DIAGNOSIS — M62838 Other muscle spasm: Secondary | ICD-10-CM | POA: Diagnosis not present

## 2019-07-25 DIAGNOSIS — Z1211 Encounter for screening for malignant neoplasm of colon: Secondary | ICD-10-CM

## 2019-07-25 MED ORDER — BLOOD GLUCOSE METER KIT: PACK | 0 refills | Status: DC

## 2019-07-25 MED ORDER — CYCLOBENZAPRINE HCL 5 MG PO TABS: 5.0000 mg | ORAL_TABLET | Freq: Every evening | ORAL | 0 refills | Status: DC | PRN

## 2019-07-25 MED ORDER — SIMVASTATIN 40 MG PO TABS: 40.0000 mg | ORAL_TABLET | Freq: Every day | ORAL | 1 refills | Status: DC

## 2019-07-25 MED ORDER — PIOGLITAZONE HCL 15 MG PO TABS: 15.0000 mg | ORAL_TABLET | Freq: Every day | ORAL | 1 refills | Status: DC

## 2019-07-25 MED ORDER — BYDUREON BCISE 2 MG/0.85ML ~~LOC~~ AUIJ: 2.0000 mg | AUTO-INJECTOR | SUBCUTANEOUS | 3 refills | Status: DC

## 2019-07-25 MED ORDER — ENALAPRIL MALEATE 10 MG PO TABS: 10.0000 mg | ORAL_TABLET | Freq: Every day | ORAL | 1 refills | Status: DC

## 2019-07-25 MED ORDER — GLIPIZIDE ER 10 MG PO TB24: 10.0000 mg | ORAL_TABLET | Freq: Two times a day (BID) | ORAL | 1 refills | Status: DC

## 2019-07-25 MED ORDER — NIFEDIPINE ER OSMOTIC RELEASE 90 MG PO TB24: 90.0000 mg | ORAL_TABLET | Freq: Every day | ORAL | 1 refills | Status: DC

## 2019-07-25 MED ORDER — METFORMIN HCL 1000 MG PO TABS: ORAL_TABLET | ORAL | 1 refills | Status: DC

## 2019-07-25 NOTE — Patient Instructions (Addendum)
Bring bydureon into office at follow up to make sure it is being given correctly.   Check blood sugars once per day either fasting or 2 hours after eating and bring record to your next visit.  I referred you to gastroenterology for colonoscopy and eye doctor for diabetic eye screening.  No medication changes today, but can discuss lab work at your follow-up visit.  Neck/shoulder symptoms are likely due to a possible pinched nerve causing muscle spasm.  Can try the muscle relaxant Flexeril at bedtime, watch out for dizziness or sedation with that medication the following day.  Heat or ice to the sore muscles as needed, Tylenol over-the-counter if needed for pain, and recheck in 2 weeks.  Sooner if worse.   Muscle Cramps and Spasms Muscle cramps and spasms are when muscles tighten by themselves. They usually get better within minutes. Muscle cramps are painful. They are usually stronger and last longer than muscle spasms. Muscle spasms may or may not be painful. They can last a few seconds or much longer. Cramps and spasms can affect any muscle, but they occur most often in the calf muscles of the leg. They are usually not caused by a serious problem. In many cases, the cause is not known. Some common causes include:  Doing more physical work or exercise than your body is ready for.  Using the muscles too much (overuse) by repeating certain movements too many times.  Staying in a certain position for a long time.  Playing a sport or doing an activity without preparing properly.  Using bad form or technique while playing a sport or doing an activity.  Not having enough water in your body (dehydration).  Injury.  Side effects of some medicines.  Low levels of the salts and minerals in your blood (electrolytes), such as low potassium or calcium. Follow these instructions at home: Managing pain and stiffness      Massage, stretch, and relax the muscle. Do this for many minutes at a  time.  If told, put heat on tight or tense muscles as often as told by your doctor. Use the heat source that your doctor recommends, such as a moist heat pack or a heating pad. ? Place a towel between your skin and the heat source. ? Leave the heat on for 20-30 minutes. ? Remove the heat if your skin turns bright red. This is very important if you are not able to feel pain, heat, or cold. You may have a greater risk of getting burned.  If told, put ice on the affected area. This may help if you are sore or have pain after a cramp or spasm. ? Put ice in a plastic bag. ? Place a towel between your skin and the bag. ? Leave the ice on for 20 minutes, 2-3 times a day.  Try taking hot showers or baths to help relax tight muscles. Eating and drinking  Drink enough fluid to keep your pee (urine) pale yellow.  Eat a healthy diet to help ensure that your muscles work well. This should include: ? Fruits and vegetables. ? Lean protein. ? Whole grains. ? Low-fat or nonfat dairy products. General instructions  If you are having cramps often, avoid intense exercise for several days.  Take over-the-counter and prescription medicines only as told by your doctor.  Watch for any changes in your symptoms.  Keep all follow-up visits as told by your doctor. This is important. Contact a doctor if:  Your cramps or  spasms get worse or happen more often.  Your cramps or spasms do not get better with time. Summary  Muscle cramps and spasms are when muscles tighten by themselves. They usually get better within minutes.  Cramps and spasms occur most often in the calf muscles of the leg.  Massage, stretch, and relax the muscle. This may help the cramp or spasm go away.  Drink enough fluid to keep your pee (urine) pale yellow. This information is not intended to replace advice given to you by your health care provider. Make sure you discuss any questions you have with your health care  provider. Document Released: 11/17/2008 Document Revised: 04/30/2018 Document Reviewed: 04/30/2018 Elsevier Patient Education  [employer]2020 [employer]      If you have lab work done today you will be contacted with your lab results within the next 2 weeks.  If you have not heard from us then please contact us. The fastest way to get your results is to register for My Chart.   IF you received an x-ray today, you will receive an invoice from [facility]. Please contact [facility] at [phone] with questions or concerns regarding your invoice.   IF you received labwork today, you will receive an invoice from [facility]. Please contact [facility] at [phone] with questions or concerns regarding your invoice.   Our billing staff will not be able to assist you with questions regarding bills from these companies.  You will be contacted with the lab results as soon as they are available. The fastest way to get your results is to activate your My Chart account. Instructions are located on the last page of this paperwork. If you have not heard from us regarding the results in 2 weeks, please contact this office.    \

## 2019-07-25 NOTE — Progress Notes (Signed)
Subjective:    Patient ID: Dylan Abbott, male    DOB: 08-Jan-1966, 53 y.o.   MRN: [id number]  HPI Dylan Abbott is a 53 y.o. male Presents today for: Chief Complaint  Patient presents with   chronic medical condition    Patient is here for his diabetes check up and medication refill   Shoulder Pain    patient stated he is having shoulder pain and neck pain on the right side. Pain been going on for 2 month    Diabetes: I last saw him in September, was seen by Dr. Pamella Pert in March.  No specific diet or exercise at that time, had run out of medications day prior.  Was not checking home blood sugars. Uncontrolled with A1c of 9.0, started GLP-1 -Bydureon BCise 2 mg weekly, continue metformin, Actos, glipizide with hypoglycemic precautions.  Exercise, low-carb diet discussed. Advised 3 month follow up.  Had been using injection - feels like med gets stuck when trying to give injection, so stopped using it 2 months ago.  Still on metfromin, actos, glipizide.  Not checking blood sugars, no known lows.  No meter - rx today.   He is on ACE inhibitor, statin. Microalbumin: Normal ratio September 2019 Optho, foot exam, pneumovax: Due for pneumococcal vaccine, up-to-date on foot exam, due for ophthalmology exam.  Lab Results  Component Value Date   HGBA1C 9.0 (A) 03/09/2019   HGBA1C 8.5 (A) 08/21/2018   HGBA1C 6.5 07/15/2016   Lab Results  Component Value Date   LDLCALC 81 03/09/2019   CREATININE 1.32 (H) 03/09/2019    Hypertension: BP Readings from Last 3 Encounters:  07/25/19 132/77  03/09/19 (!) 160/94  12/26/18 130/78   Lab Results  Component Value Date   CREATININE 1.32 (H) 03/09/2019  Elevated off meds at March visit.  Restarted on Vasotec and Procardia. Taking both BP meds daily - no new side effects.   . Hyperlipidemia:  Lab Results  Component Value Date   CHOL 159 03/09/2019   HDL 57 03/09/2019   LDLCALC 81 03/09/2019   TRIG 103 03/09/2019   CHOLHDL 2.8  03/09/2019   Lab Results  Component Value Date   ALT 17 03/09/2019   AST 25 03/09/2019   ALKPHOS 56 03/09/2019   BILITOT 0.5 03/09/2019  zocor 20m qd.  No mew myalgias.   R shoulder pain: 5 months. NKI.  Notes some soreness into R neck after sleeping at times, and notes stiffness on R side of neck at sme times.  No weakness.  No meds.     Patient Active Problem List   Diagnosis Date Noted   Diabetes mellitus ([facility] 11/13/2017   Hyperlipidemia 11/13/2017   Hypertensive disorder 11/13/2017   Past Medical History:  Diagnosis Date   Diabetes mellitus without complication ([place]    Hyperlipidemia    Hypertension    No past surgical history on file. No Known Allergies Prior to Admission medications   Medication Sig Start Date End Date Taking? Authorizing Provider  enalapril (VASOTEC) 10 MG tablet Take 1 tablet (10 mg total) by mouth daily. 03/09/19  Yes SRutherford Guys MD  Exenatide ER (BYDUREON BCISE) 2 MG/0.85ML AUIJ Inject 2 mg into the skin once a week. 03/09/19  Yes SRutherford Guys MD  glipiZIDE (GLUCOTROL XL) 10 MG 24 hr tablet Take 1 tablet (10 mg total) by mouth 2 (two) times daily. 03/09/19  Yes SRutherford Guys MD  metFORMIN (GLUCOPHAGE) 1000 MG tablet 1 tablet twice a day by  oral route 03/09/19  Yes Rutherford Guys, MD  NIFEdipine (PROCARDIA XL) 90 MG 24 hr tablet Take 1 tablet (90 mg total) by mouth daily. 03/09/19  Yes Rutherford Guys, MD  pioglitazone (ACTOS) 15 MG tablet Take 1 tablet (15 mg total) by mouth daily. 03/09/19  Yes Rutherford Guys, MD  simvastatin (ZOCOR) 40 MG tablet Take 1 tablet (40 mg total) by mouth daily at 6 PM. 03/09/19  Yes Rutherford Guys, MD   Social History   Socioeconomic History   Marital status: Married    Spouse name: Not on file   Number of children: 2   Years of education: Not on file   Highest education level: Not on file  Occupational History   Occupation: environmental  Social Needs   Financial resource  strain: Not on file   Food insecurity    Worry: Not on file    Inability: Not on file   Transportation needs    Medical: Not on file    Non-medical: Not on file  Tobacco Use   Smoking status: Never Smoker   Smokeless tobacco: Never Used  Substance and Sexual Activity   Alcohol use: Not on file   Drug use: Not on file   Sexual activity: Not on file  Lifestyle   Physical activity    Days per week: Not on file    Minutes per session: Not on file   Stress: Not on file  Relationships   Social connections    Talks on phone: Not on file    Gets together: Not on file    Attends religious service: Not on file    Active member of club or organization: Not on file    Attends meetings of clubs or organizations: Not on file    Relationship status: Not on file   Intimate partner violence    Fear of current or ex partner: Not on file    Emotionally abused: Not on file    Physically abused: Not on file    Forced sexual activity: Not on file  Other Topics Concern   Not on file  Social History Narrative   Not on file    Review of Systems  Constitutional: Negative for fatigue and unexpected weight change.  Eyes: Negative for visual disturbance.  Respiratory: Negative for cough, chest tightness and shortness of breath.   Cardiovascular: Negative for chest pain, palpitations and leg swelling.  Gastrointestinal: Negative for abdominal pain and blood in stool.  Neurological: Negative for dizziness, light-headedness and headaches.       Objective:   Physical Exam Vitals signs reviewed.  Constitutional:      Appearance: He is well-developed.  HENT:     Head: Normocephalic and atraumatic.  Eyes:     Pupils: Pupils are equal, round, and reactive to light.  Neck:     Vascular: No carotid bruit or JVD.  Cardiovascular:     Rate and Rhythm: Normal rate and regular rhythm.     Heart sounds: Normal heart sounds. No murmur.  Pulmonary:     Effort: Pulmonary effort is  normal.     Breath sounds: Normal breath sounds. No rales.  Musculoskeletal:     Right shoulder: Normal. He exhibits normal range of motion, no tenderness, no bony tenderness, no swelling and normal strength.     Cervical back: He exhibits decreased range of motion (Slight decreased rotation/lateral flexion/extension, some tightness into the right neck towards shoulder with right lateral flexion.  No  midline bony tenderness) and spasm. He exhibits no bony tenderness.  Skin:    General: Skin is warm and dry.  Neurological:     Mental Status: He is alert and oriented to person, place, and time.     Comments: Difficulty with obtaining upper extremity reflexes, but  equal bilaterally.  Strength was intact throughout upper extremities bilaterally    Vitals:   07/25/19 1617  BP: 132/77  Pulse: 76  Resp: 16  Temp: 98.3 F (36.8 C)  TempSrc: Oral  SpO2: 98%  Weight: 241 lb 3.2 oz (109.4 kg)        Assessment & Plan:    Dylan Abbott is a 53 y.o. male Muscle spasms of neck - Plan: cyclobenzaprine (FLEXERIL) 5 MG tablet Pain in joint of right shoulder - Plan: cyclobenzaprine (FLEXERIL) 5 MG tablet  -Possible cervical radiculopathy symptoms, intermittent, with spasm.  Overall reassuring exam.  Trial of Flexeril, heat or ice and other symptomatic care with recheck in next few weeks.  Essential hypertension - Plan: enalapril (VASOTEC) 10 MG tablet, NIFEdipine (PROCARDIA XL) 90 MG 24 hr tablet, Comprehensive metabolic panel  -Stable, continue same regimen, labs pending.  Type 2 diabetes mellitus with hyperglycemia, without long-term current use of insulin (HCC) - Plan: glipiZIDE (GLUCOTROL XL) 10 MG 24 hr tablet, metFORMIN (GLUCOPHAGE) 1000 MG tablet, pioglitazone (ACTOS) 15 MG tablet, Hemoglobin A1c, Comprehensive metabolic panel, blood glucose meter kit and supplies, Ambulatory referral to Ophthalmology, Exenatide ER (BYDUREON BCISE) 2 MG/0.85ML AUIJ, Pneumococcal polysaccharide vaccine  23-valent greater than or equal to 2yo subcutaneous/IM  -Importance of every 3 month follow-up for now discussed until improved control.  Denies any barriers to care.  -Difficulty with use of Bydureon BCise, plans to bring into the office to review administration at next visit.  Refilled other meds same dose for now and hypoglycemia precautions discussed with any additional medication.  Meter prescribed.  -Refer to ophthalmology  Hyperlipidemia, unspecified hyperlipidemia type - Plan: simvastatin (ZOCOR) 40 MG tablet  -Tolerating simvastatin, continue same dose  Screen for colon cancer - Plan: Ambulatory referral to Gastroenterology  Need for prophylactic vaccination against Streptococcus pneumoniae (pneumococcus) - Plan: Pneumococcal polysaccharide vaccine 23-valent greater than or equal to 2yo subcutaneous/IM   Meds ordered this encounter  Medications   enalapril (VASOTEC) 10 MG tablet    Sig: Take 1 tablet (10 mg total) by mouth daily.    Dispense:  90 tablet    Refill:  1   glipiZIDE (GLUCOTROL XL) 10 MG 24 hr tablet    Sig: Take 1 tablet (10 mg total) by mouth 2 (two) times daily.    Dispense:  180 tablet    Refill:  1   metFORMIN (GLUCOPHAGE) 1000 MG tablet    Sig: 1 tablet twice a day by oral route    Dispense:  180 tablet    Refill:  1   NIFEdipine (PROCARDIA XL) 90 MG 24 hr tablet    Sig: Take 1 tablet (90 mg total) by mouth daily.    Dispense:  90 tablet    Refill:  1   pioglitazone (ACTOS) 15 MG tablet    Sig: Take 1 tablet (15 mg total) by mouth daily.    Dispense:  90 tablet    Refill:  1   simvastatin (ZOCOR) 40 MG tablet    Sig: Take 1 tablet (40 mg total) by mouth daily at 6 PM.    Dispense:  90 tablet    Refill:  1   blood  glucose meter kit and supplies    Sig: Dispense based on patient and insurance preference. Once per day - fasting or 2 hrs after meal  Dx: E11.65.    Dispense:  1 each    Refill:  0    Order Specific Question:   Number of strips      Answer:   100    Order Specific Question:   Number of lancets    Answer:   100   cyclobenzaprine (FLEXERIL) 5 MG tablet    Sig: Take 1 tablet (5 mg total) by mouth at bedtime as needed.    Dispense:  15 tablet    Refill:  0   Exenatide ER (BYDUREON BCISE) 2 MG/0.85ML AUIJ    Sig: Inject 2 mg into the skin once a week.    Dispense:  4 pen    Refill:  3   Patient Instructions    Bring bydureon into office at follow up to make sure it is being given correctly.   Check blood sugars once per day either fasting or 2 hours after eating and bring record to your next visit.  I referred you to gastroenterology for colonoscopy and eye doctor for diabetic eye screening.  No medication changes today, but can discuss lab work at your follow-up visit.  Neck/shoulder symptoms are likely due to a possible pinched nerve causing muscle spasm.  Can try the muscle relaxant Flexeril at bedtime, watch out for dizziness or sedation with that medication the following day.  Heat or ice to the sore muscles as needed, Tylenol over-the-counter if needed for pain, and recheck in 2 weeks.  Sooner if worse.   Muscle Cramps and Spasms Muscle cramps and spasms are when muscles tighten by themselves. They usually get better within minutes. Muscle cramps are painful. They are usually stronger and last longer than muscle spasms. Muscle spasms may or may not be painful. They can last a few seconds or much longer. Cramps and spasms can affect any muscle, but they occur most often in the calf muscles of the leg. They are usually not caused by a serious problem. In many cases, the cause is not known. Some common causes include:  Doing more physical work or exercise than your body is ready for.  Using the muscles too much (overuse) by repeating certain movements too many times.  Staying in a certain position for a long time.  Playing a sport or doing an activity without preparing properly.  Using bad form or  technique while playing a sport or doing an activity.  Not having enough water in your body (dehydration).  Injury.  Side effects of some medicines.  Low levels of the salts and minerals in your blood (electrolytes), such as low potassium or calcium. Follow these instructions at home: Managing pain and stiffness      Massage, stretch, and relax the muscle. Do this for many minutes at a time.  If told, put heat on tight or tense muscles as often as told by your doctor. Use the heat source that your doctor recommends, such as a moist heat pack or a heating pad. ? Place a towel between your skin and the heat source. ? Leave the heat on for 20-30 minutes. ? Remove the heat if your skin turns bright red. This is very important if you are not able to feel pain, heat, or cold. You may have a greater risk of getting burned.  If told, put ice on the affected area.  This may help if you are sore or have pain after a cramp or spasm. ? Put ice in a plastic bag. ? Place a towel between your skin and the bag. ? Leave the ice on for 20 minutes, 2-3 times a day.  Try taking hot showers or baths to help relax tight muscles. Eating and drinking  Drink enough fluid to keep your pee (urine) pale yellow.  Eat a healthy diet to help ensure that your muscles work well. This should include: ? Fruits and vegetables. ? Lean protein. ? Whole grains. ? Low-fat or nonfat dairy products. General instructions  If you are having cramps often, avoid intense exercise for several days.  Take over-the-counter and prescription medicines only as told by your doctor.  Watch for any changes in your symptoms.  Keep all follow-up visits as told by your doctor. This is important. Contact a doctor if:  Your cramps or spasms get worse or happen more often.  Your cramps or spasms do not get better with time. Summary  Muscle cramps and spasms are when muscles tighten by themselves. They usually get better  within minutes.  Cramps and spasms occur most often in the calf muscles of the leg.  Massage, stretch, and relax the muscle. This may help the cramp or spasm go away.  Drink enough fluid to keep your pee (urine) pale yellow. This information is not intended to replace advice given to you by your health care provider. Make sure you discuss any questions you have with your health care provider. Document Released: 11/17/2008 Document Revised: 04/30/2018 Document Reviewed: 04/30/2018 Elsevier Patient Education  [employer].      If you have lab work done today you will be contacted with your lab results within the next 2 weeks.  If you have not heard from [place] then please contact us. The fastest way to get your results is to register for My Chart.   IF you received an x-ray today, you will receive an invoice from [facility]. Please contact [facility] at [phone] with questions or concerns regarding your invoice.   IF you received labwork today, you will receive an invoice from [place]. Please contact [facility] at [phone] with questions or concerns regarding your invoice.   Our billing staff will not be able to assist you with questions regarding bills from these companies.  You will be contacted with the lab results as soon as they are available. The fastest way to get your results is to activate your My Chart account. Instructions are located on the last page of this paperwork. If you have not heard from [place] regarding the results in 2 weeks, please contact this office.    \  Signed,   Merri Ray, MD [facility] [place].  07/25/19 9:33 PM

## 2019-07-26 ENCOUNTER — Encounter: Payer: Self-pay | Admitting: Gastroenterology

## 2019-07-26 LAB — COMPREHENSIVE METABOLIC PANEL
ALT: 23 IU/L (ref 0–44)
AST: 32 IU/L (ref 0–40)
Albumin/Globulin Ratio: 1.9 (ref 1.2–2.2)
Albumin: 4.9 g/dL (ref 3.8–4.9)
Alkaline Phosphatase: 54 IU/L (ref 39–117)
BUN/Creatinine Ratio: 13 (ref 9–20)
BUN: 24 mg/dL (ref 6–24)
Bilirubin Total: 0.5 mg/dL (ref 0.0–1.2)
CO2: 25 mmol/L (ref 20–29)
Calcium: 9.7 mg/dL (ref 8.7–10.2)
Chloride: 97 mmol/L (ref 96–106)
Creatinine, Ser: 1.9 mg/dL — ABNORMAL HIGH (ref 0.76–1.27)
GFR calc Af Amer: 46 mL/min/{1.73_m2} — ABNORMAL LOW (ref >59)
GFR calc non Af Amer: 39 mL/min/{1.73_m2} — ABNORMAL LOW (ref >59)
Globulin, Total: 2.6 g/dL (ref 1.5–4.5)
Glucose: 100 mg/dL — ABNORMAL HIGH (ref 65–99)
Potassium: 3.5 mmol/L (ref 3.5–5.2)
Sodium: 141 mmol/L (ref 134–144)
Total Protein: 7.5 g/dL (ref 6.0–8.5)

## 2019-07-26 LAB — HEMOGLOBIN A1C
Est. average glucose Bld gHb Est-mCnc: 186 mg/dL
Hgb A1c MFr Bld: 8.1 % — ABNORMAL HIGH (ref 4.8–5.6)

## 2019-07-30 ENCOUNTER — Other Ambulatory Visit: Payer: Self-pay | Admitting: Family Medicine

## 2019-07-30 DIAGNOSIS — R7989 Other specified abnormal findings of blood chemistry: Secondary | ICD-10-CM

## 2019-07-30 NOTE — Progress Notes (Signed)
See lab notes

## 2019-07-31 ENCOUNTER — Ambulatory Visit (INDEPENDENT_AMBULATORY_CARE_PROVIDER_SITE_OTHER): Payer: PRIVATE HEALTH INSURANCE | Admitting: Family Medicine

## 2019-07-31 ENCOUNTER — Other Ambulatory Visit: Payer: Self-pay

## 2019-07-31 DIAGNOSIS — R7989 Other specified abnormal findings of blood chemistry: Secondary | ICD-10-CM

## 2019-07-31 NOTE — Progress Notes (Signed)
Lab only visit. Pt not seen/evaluated.

## 2019-08-01 LAB — BASIC METABOLIC PANEL
BUN/Creatinine Ratio: 13 (ref 9–20)
BUN: 31 mg/dL — ABNORMAL HIGH (ref 6–24)
CO2: 25 mmol/L (ref 20–29)
Calcium: 10.1 mg/dL (ref 8.7–10.2)
Chloride: 95 mmol/L — ABNORMAL LOW (ref 96–106)
Creatinine, Ser: 2.46 mg/dL — ABNORMAL HIGH (ref 0.76–1.27)
GFR calc Af Amer: 33 mL/min/{1.73_m2} — ABNORMAL LOW (ref >59)
GFR calc non Af Amer: 29 mL/min/{1.73_m2} — ABNORMAL LOW (ref >59)
Glucose: 80 mg/dL (ref 65–99)
Potassium: 3.4 mmol/L — ABNORMAL LOW (ref 3.5–5.2)
Sodium: 139 mmol/L (ref 134–144)

## 2019-08-05 ENCOUNTER — Ambulatory Visit: Payer: PRIVATE HEALTH INSURANCE | Admitting: Family Medicine

## 2019-08-05 ENCOUNTER — Other Ambulatory Visit: Payer: Self-pay | Admitting: Family Medicine

## 2019-08-05 ENCOUNTER — Other Ambulatory Visit: Payer: Self-pay

## 2019-08-05 DIAGNOSIS — R7989 Other specified abnormal findings of blood chemistry: Secondary | ICD-10-CM

## 2019-08-06 ENCOUNTER — Encounter: Payer: Self-pay | Admitting: Family Medicine

## 2019-08-06 LAB — BASIC METABOLIC PANEL
BUN/Creatinine Ratio: 13 (ref 9–20)
BUN: 35 mg/dL — ABNORMAL HIGH (ref 6–24)
CO2: 25 mmol/L (ref 20–29)
Calcium: 9.8 mg/dL (ref 8.7–10.2)
Chloride: 99 mmol/L (ref 96–106)
Creatinine, Ser: 2.73 mg/dL — ABNORMAL HIGH (ref 0.76–1.27)
GFR calc Af Amer: 29 mL/min/{1.73_m2} — ABNORMAL LOW (ref >59)
GFR calc non Af Amer: 25 mL/min/{1.73_m2} — ABNORMAL LOW (ref >59)
Glucose: 95 mg/dL (ref 65–99)
Potassium: 3.6 mmol/L (ref 3.5–5.2)
Sodium: 140 mmol/L (ref 134–144)

## 2019-08-06 NOTE — Progress Notes (Unsigned)
Called with results of elevated creatinine. Stop metformin and enalapril. Recheck in office next few days for repeat test and U/A  ER precautions given.  Also discussed with nephrology. They will get him in office in next week for eval. Will send referral.

## 2019-08-07 ENCOUNTER — Encounter: Payer: Self-pay | Admitting: Family Medicine

## 2019-08-08 ENCOUNTER — Ambulatory Visit: Payer: PRIVATE HEALTH INSURANCE | Admitting: Family Medicine

## 2019-08-09 ENCOUNTER — Ambulatory Visit (INDEPENDENT_AMBULATORY_CARE_PROVIDER_SITE_OTHER): Payer: PRIVATE HEALTH INSURANCE | Admitting: Family Medicine

## 2019-08-09 ENCOUNTER — Other Ambulatory Visit: Payer: Self-pay

## 2019-08-09 VITALS — BP 149/80 | HR 86 | Temp 98.9°F | Resp 12 | Wt 241.6 lb

## 2019-08-09 DIAGNOSIS — E1165 Type 2 diabetes mellitus with hyperglycemia: Secondary | ICD-10-CM | POA: Diagnosis not present

## 2019-08-09 DIAGNOSIS — R809 Proteinuria, unspecified: Secondary | ICD-10-CM | POA: Diagnosis not present

## 2019-08-09 DIAGNOSIS — R7989 Other specified abnormal findings of blood chemistry: Secondary | ICD-10-CM

## 2019-08-09 LAB — POCT URINALYSIS DIP (MANUAL ENTRY)
Bilirubin, UA: NEGATIVE
Glucose, UA: 100 mg/dL — AB
Ketones, POC UA: NEGATIVE mg/dL
Leukocytes, UA: NEGATIVE
Nitrite, UA: NEGATIVE
Protein Ur, POC: 30 mg/dL — AB
Spec Grav, UA: 1.02 (ref 1.010–1.025)
Urobilinogen, UA: 0.2 E.U./dL
pH, UA: 6 (ref 5.0–8.0)

## 2019-08-09 LAB — GLUCOSE, POCT (MANUAL RESULT ENTRY): POC Glucose: 91 mg/dl (ref 70–99)

## 2019-08-09 MED ORDER — ACCU-CHEK MULTICLIX LANCETS MISC: 12 refills | Status: DC

## 2019-08-09 NOTE — Patient Instructions (Addendum)
   Will hold on new meds for now until seen by kidney doctor - only glipizide and actos for now.   Due to decreased kidney function, take glipizide only 1 pill/day for now.  Monitor your blood pressure outside the office if possible and if readings remain over 140/90, let me know.  You should also be seen by nephrology early next week.  Let me know if you do not receive a phone call from them on Monday.  Return to the clinic or go to the nearest emergency room if any of your symptoms worsen or new symptoms occur.    If you have lab work done today you will be contacted with your lab results within the next 2 weeks.  If you have not heard from [place] then please contact us. The fastest way to get your results is to register for My Chart.   IF you received an x-ray today, you will receive an invoice from [facility]. Please contact [facility] at [phone] with questions or concerns regarding your invoice.   IF you received labwork today, you will receive an invoice from [place]. Please contact [facility] at [phone] with questions or concerns regarding your invoice.   Our billing staff will not be able to assist you with questions regarding bills from these companies.  You will be contacted with the lab results as soon as they are available. The fastest way to get your results is to activate your My Chart account. Instructions are located on the last page of this paperwork. If you have not heard from [place] regarding the results in 2 weeks, please contact this office.

## 2019-08-09 NOTE — Progress Notes (Signed)
Subjective:    Patient ID: Dylan Abbott, male    DOB: 20-Jun-1966, 53 y.o.   MRN: [id number]  HPI Dylan Abbott is a 53 y.o. male Presents today for: Chief Complaint  Patient presents with  . Diabetes    Here for a f/u on diabetes Blood sugar this am was 134. Patient wanted [place] to check our glucose compared to his glucose machine and they were about the same. patient need a new different order for plan lancets the one he have to go with machine will not work for him and did not work for me either.   Diabetes with hyperglycemia. Initially seen August 6. Uncontrolled diabetes at that time, was having difficulty with use of his Bydureon BCise.  Plan for him to bring that into the office to review dosing.  Continued on Glucophage, glipizide, Actos. Lab Results  Component Value Date   HGBA1C 8.1 (H) 07/25/2019   However after visit was noted to have elevated creatinine. Creatinine 1.35-1.32 from September 2019 to March 2020.  On August 6 creatinine was 1.90.  Repeat lab only test on August 12, creatinine 2.46, then again on the 17th 2.73. Discussed with nephrology 3 days ago.  Advised patient to stop his metformin and stop enalapril.  Plan for nephrology follow-up outpatient within 1 week.  Referral was placed.  No n/v abd pain.  No difficulty urinating. Still some foamy urine.  Nnot feeling fatigues at this time - better from few days ago.  Stopped enalapril. Stopped metformin 3 days ago.  Blood sugar 117, 141, 101, 198, 140, 134. 95 today in office. .  - only on glipizide 88m BID.  and actos.  Last ate 5 hrs ago.  Has meter with lancets - just wondered if machine working.  (glucose 95 in office vs 91 when we checked).     Patient Active Problem List   Diagnosis Date Noted  . Diabetes mellitus ([place] 11/13/2017  . Hyperlipidemia 11/13/2017  . Hypertensive disorder 11/13/2017   Past Medical History:  Diagnosis Date  . Diabetes mellitus without complication ([place]   .  Hyperlipidemia   . Hypertension    No past surgical history on file. No Known Allergies Prior to Admission medications   Medication Sig Start Date End Date Taking? Authorizing Provider  blood glucose meter kit and supplies Dispense based on patient and insurance preference. Once per day - fasting or 2 hrs after meal  Dx: E11.65. 07/25/19  Yes GWendie Agreste MD  cyclobenzaprine (FLEXERIL) 5 MG tablet Take 1 tablet (5 mg total) by mouth at bedtime as needed. 07/25/19  Yes GWendie Agreste MD  Exenatide ER (BYDUREON BCISE) 2 MG/0.85ML AUIJ Inject 2 mg into the skin once a week. 07/25/19  Yes GWendie Agreste MD  glipiZIDE (GLUCOTROL XL) 10 MG 24 hr tablet Take 1 tablet (10 mg total) by mouth 2 (two) times daily. 07/25/19  Yes GWendie Agreste MD  metFORMIN (GLUCOPHAGE) 1000 MG tablet 1 tablet twice a day by oral route 07/25/19  Yes GWendie Agreste MD  NIFEdipine (PROCARDIA XL) 90 MG 24 hr tablet Take 1 tablet (90 mg total) by mouth daily. 07/25/19  Yes GWendie Agreste MD  pioglitazone (ACTOS) 15 MG tablet Take 1 tablet (15 mg total) by mouth daily. 07/25/19  Yes GWendie Agreste MD  simvastatin (ZOCOR) 40 MG tablet Take 1 tablet (40 mg total) by mouth daily at 6 PM. 07/25/19  Yes GWendie Agreste MD  enalapril (VASOTEC)  10 MG tablet Take 1 tablet (10 mg total) by mouth daily. Patient not taking: Reported on 08/09/2019 07/25/19   [name], MD   Social History   Socioeconomic History  . Marital status: Married    Spouse name: Not on file  . Number of children: 2  . Years of education: Not on file  . Highest education level: Not on file  Occupational History  . Occupation: environmental  Social Needs  . Financial resource strain: Not on file  . Food insecurity    Worry: Not on file    Inability: Not on file  . Transportation needs    Medical: Not on file    Non-medical: Not on file  Tobacco Use  . Smoking status: Never Smoker  . Smokeless tobacco: Never Used  Substance and  Sexual Activity  . Alcohol use: Not on file  . Drug use: Not on file  . Sexual activity: Not on file  Lifestyle  . Physical activity    Days per week: Not on file    Minutes per session: Not on file  . Stress: Not on file  Relationships  . Social Herbalist on phone: Not on file    Gets together: Not on file    Attends religious service: Not on file    Active member of club or organization: Not on file    Attends meetings of clubs or organizations: Not on file    Relationship status: Not on file  . Intimate partner violence    Fear of current or ex partner: Not on file    Emotionally abused: Not on file    Physically abused: Not on file    Forced sexual activity: Not on file  Other Topics Concern  . Not on file  Social History Narrative  . Not on file    Review of Systems Per HPi.     Objective:   Physical Exam Vitals signs reviewed.  Constitutional:      Appearance: He is well-developed.  HENT:     Head: Normocephalic and atraumatic.  Eyes:     Pupils: Pupils are equal, round, and reactive to light.  Neck:     Vascular: No carotid bruit or JVD.  Cardiovascular:     Rate and Rhythm: Normal rate and regular rhythm.     Heart sounds: Normal heart sounds. No murmur.  Pulmonary:     Effort: Pulmonary effort is normal.     Breath sounds: Normal breath sounds. No rales.  Abdominal:     General: There is no distension.     Tenderness: There is no abdominal tenderness.     Comments: No bruit.   Musculoskeletal:     Right lower leg: No edema.     Left lower leg: No edema.  Skin:    General: Skin is warm and dry.  Neurological:     Mental Status: He is alert and oriented to person, place, and time.    Vitals:   08/09/19 1610  BP: (!) 149/80  Pulse: 86  Resp: 12  Temp: 98.9 F (37.2 C)  TempSrc: Oral  SpO2: 97%  Weight: 241 lb 9.6 oz (109.6 kg)   Initial BP 143 /80.   Results for orders placed or performed in visit on 08/09/19  POCT glucose  (manual entry)  Result Value Ref Range   POC Glucose 91 70 - 99 mg/dl  POCT urinalysis dipstick  Result Value Ref Range   Color, UA  yellow yellow   Clarity, UA clear clear   Glucose, UA =100 (A) negative mg/dL   Bilirubin, UA negative negative   Ketones, POC UA negative negative mg/dL   Spec Grav, UA 1.020 1.010 - 1.025   Blood, UA trace-lysed (A) negative   pH, UA 6.0 5.0 - 8.0   Protein Ur, POC =30 (A) negative mg/dL   Urobilinogen, UA 0.2 0.2 or 1.0 E.U./dL   Nitrite, UA Negative Negative   Leukocytes, UA Negative Negative        Assessment & Plan:   Dylan Abbott is a 53 y.o. male Elevated serum creatinine - Plan: Basic Metabolic Panel, POCT urinalysis dipstick  Type 2 diabetes mellitus with hyperglycemia, without long-term current use of insulin (HCC) - Plan: POCT glucose (manual entry), POCT urinalysis dipstick, Lancets (ACCU-CHEK MULTICLIX) lancets  Proteinuria, unspecified type  Increasing creatinine, repeat testing performed.  Proteinuria noted.  Nephrology eval pending.  Does not appear fluid overloaded, fatigue has improved, otherwise asymptomatic.   - Continue to hold metformin, enalapril.  Glipizide decreased to once per day given borderline low readings and potential need for decreased dosing if low GFR.  Continue to monitor home readings, rtc and ER precautions discussed.   Meds ordered this encounter  Medications  . Lancets (ACCU-CHEK MULTICLIX) lancets    Sig: Use as instructed    Dispense:  100 each    Refill:  12    Lancets as needed for patient's glucometer or new lancet system if needed.   Patient Instructions     Will hold on new meds for now until seen by kidney doctor - only glipizide and actos for now.   Due to decreased kidney function, take glipizide only 1 pill/day for now.  Monitor your blood pressure outside the office if possible and if readings remain over 140/90, let me know.  You should also be seen by nephrology early next week.   Let me know if you do not receive a phone call from them on Monday.  Return to the clinic or go to the nearest emergency room if any of your symptoms worsen or new symptoms occur.    If you have lab work done today you will be contacted with your lab results within the next 2 weeks.  If you have not heard from [place] then please contact us. The fastest way to get your results is to register for My Chart.   IF you received an x-ray today, you will receive an invoice from [facility]. Please contact [facility] at [phone] with questions or concerns regarding your invoice.   IF you received labwork today, you will receive an invoice from [facility]. Please contact [facility] at [phone] with questions or concerns regarding your invoice.   Our billing staff will not be able to assist you with questions regarding bills from these companies.  You will be contacted with the lab results as soon as they are available. The fastest way to get your results is to activate your My Chart account. Instructions are located on the last page of this paperwork. If you have not heard from [place] regarding the results in 2 weeks, please contact this office.       Signed,   Merri Ray, MD [facility] [place].  08/11/19 11:36 AM

## 2019-08-10 LAB — BASIC METABOLIC PANEL
BUN/Creatinine Ratio: 17 (ref 9–20)
BUN: 29 mg/dL — ABNORMAL HIGH (ref 6–24)
CO2: 24 mmol/L (ref 20–29)
Calcium: 9.8 mg/dL (ref 8.7–10.2)
Chloride: 96 mmol/L (ref 96–106)
Creatinine, Ser: 1.69 mg/dL — ABNORMAL HIGH (ref 0.76–1.27)
GFR calc Af Amer: 52 mL/min/{1.73_m2} — ABNORMAL LOW (ref >59)
GFR calc non Af Amer: 45 mL/min/{1.73_m2} — ABNORMAL LOW (ref >59)
Glucose: 93 mg/dL (ref 65–99)
Potassium: 3.2 mmol/L — ABNORMAL LOW (ref 3.5–5.2)
Sodium: 140 mmol/L (ref 134–144)

## 2019-08-11 ENCOUNTER — Encounter: Payer: Self-pay | Admitting: Family Medicine

## 2019-08-13 ENCOUNTER — Telehealth: Payer: Self-pay

## 2019-08-13 NOTE — Telephone Encounter (Signed)
Patient No Showed for PV. I called the patient to reschedule PV. Patient states he is out of town and wont be back for a couple of weeks. Patient requested to cancel his procedure. I instructed the patient to please call us back and reschedule PV and procedure when he returns to town. Patient states that he would. Patient was given our number to reschedule.   Riki Sheer, LPN ( PV )

## 2019-08-14 ENCOUNTER — Encounter: Payer: Self-pay | Admitting: Family Medicine

## 2019-08-27 ENCOUNTER — Encounter: Payer: Self-pay | Admitting: Radiology

## 2019-08-27 ENCOUNTER — Encounter: Payer: 59 | Admitting: Gastroenterology

## 2019-09-02 ENCOUNTER — Other Ambulatory Visit: Payer: Self-pay | Admitting: Nephrology

## 2019-09-02 DIAGNOSIS — N183 Chronic kidney disease, stage 3 unspecified: Secondary | ICD-10-CM

## 2019-09-12 ENCOUNTER — Ambulatory Visit
Admission: RE | Admit: 2019-09-12 | Discharge: 2019-09-12 | Disposition: A | Discharge status: Home / Self Care | Payer: 59 | Source: Ambulatory Visit | Attending: Nephrology | Admitting: Nephrology

## 2019-09-12 DIAGNOSIS — N183 Chronic kidney disease, stage 3 unspecified: Secondary | ICD-10-CM

## 2019-10-04 ENCOUNTER — Other Ambulatory Visit: Payer: Self-pay | Admitting: Family Medicine

## 2019-10-04 DIAGNOSIS — E1165 Type 2 diabetes mellitus with hyperglycemia: Secondary | ICD-10-CM

## 2019-10-25 ENCOUNTER — Other Ambulatory Visit: Payer: Self-pay | Admitting: Family Medicine

## 2019-10-28 ENCOUNTER — Encounter (HOSPITAL_COMMUNITY): Payer: Self-pay | Admitting: Emergency Medicine

## 2019-10-28 ENCOUNTER — Other Ambulatory Visit: Payer: Self-pay

## 2019-10-28 ENCOUNTER — Emergency Department (HOSPITAL_COMMUNITY): Payer: 59

## 2019-10-28 ENCOUNTER — Emergency Department (HOSPITAL_COMMUNITY)
Admission: EM | Admit: 2019-10-28 | Discharge: 2019-10-28 | Disposition: A | Discharge status: Home / Self Care | Payer: 59 | Source: Home / Self Care | Attending: Emergency Medicine | Admitting: Emergency Medicine

## 2019-10-28 DIAGNOSIS — E119 Type 2 diabetes mellitus without complications: Secondary | ICD-10-CM | POA: Insufficient documentation

## 2019-10-28 DIAGNOSIS — Z79899 Other long term (current) drug therapy: Secondary | ICD-10-CM | POA: Insufficient documentation

## 2019-10-28 DIAGNOSIS — Z20822 Contact with and (suspected) exposure to covid-19: Secondary | ICD-10-CM

## 2019-10-28 DIAGNOSIS — Z20828 Contact with and (suspected) exposure to other viral communicable diseases: Secondary | ICD-10-CM

## 2019-10-28 DIAGNOSIS — I10 Essential (primary) hypertension: Secondary | ICD-10-CM | POA: Insufficient documentation

## 2019-10-28 DIAGNOSIS — U071 COVID-19: Secondary | ICD-10-CM | POA: Insufficient documentation

## 2019-10-28 LAB — CBC WITH DIFFERENTIAL/PLATELET
Abs Immature Granulocytes: 0.02 10*3/uL (ref 0.00–0.07)
Basophils Absolute: 0 10*3/uL (ref 0.0–0.1)
Basophils Relative: 0 %
Eosinophils Absolute: 0 10*3/uL (ref 0.0–0.5)
Eosinophils Relative: 0 %
HCT: 37.5 % — ABNORMAL LOW (ref 39.0–52.0)
Hemoglobin: 13.4 g/dL (ref 13.0–17.0)
Immature Granulocytes: 0 %
Lymphocytes Relative: 32 %
Lymphs Abs: 1.5 10*3/uL (ref 0.7–4.0)
MCH: 30.6 pg (ref 26.0–34.0)
MCHC: 35.7 g/dL (ref 30.0–36.0)
MCV: 85.6 fL (ref 80.0–100.0)
Monocytes Absolute: 0.4 10*3/uL (ref 0.1–1.0)
Monocytes Relative: 8 %
Neutro Abs: 2.7 10*3/uL (ref 1.7–7.7)
Neutrophils Relative %: 60 %
Platelets: 123 10*3/uL — ABNORMAL LOW (ref 150–400)
RBC: 4.38 MIL/uL (ref 4.22–5.81)
RDW: 13.2 % (ref 11.5–15.5)
WBC: 4.6 10*3/uL (ref 4.0–10.5)
nRBC: 0 % (ref 0.0–0.2)

## 2019-10-28 LAB — URINALYSIS, ROUTINE W REFLEX MICROSCOPIC
Bilirubin Urine: NEGATIVE
Glucose, UA: >=500 mg/dL — AB
Hgb urine dipstick: NEGATIVE
Ketones, ur: NEGATIVE mg/dL
Leukocytes,Ua: NEGATIVE
Nitrite: NEGATIVE
Protein, ur: 30 mg/dL — AB
Specific Gravity, Urine: 1.018 (ref 1.005–1.030)
pH: 6 (ref 5.0–8.0)

## 2019-10-28 LAB — CBG MONITORING, ED: Glucose-Capillary: 184 mg/dL — ABNORMAL HIGH (ref 70–99)

## 2019-10-28 LAB — COMPREHENSIVE METABOLIC PANEL
ALT: 31 U/L (ref 0–44)
AST: 55 U/L — ABNORMAL HIGH (ref 15–41)
Albumin: 3.8 g/dL (ref 3.5–5.0)
Alkaline Phosphatase: 58 U/L (ref 38–126)
Anion gap: 12 (ref 5–15)
BUN: 15 mg/dL (ref 6–20)
CO2: 26 mmol/L (ref 22–32)
Calcium: 8.8 mg/dL — ABNORMAL LOW (ref 8.9–10.3)
Chloride: 95 mmol/L — ABNORMAL LOW (ref 98–111)
Creatinine, Ser: 1.43 mg/dL — ABNORMAL HIGH (ref 0.61–1.24)
GFR calc Af Amer: >60 mL/min (ref >60)
GFR calc non Af Amer: 55 mL/min — ABNORMAL LOW (ref >60)
Glucose, Bld: 228 mg/dL — ABNORMAL HIGH (ref 70–99)
Potassium: 3 mmol/L — ABNORMAL LOW (ref 3.5–5.1)
Sodium: 133 mmol/L — ABNORMAL LOW (ref 135–145)
Total Bilirubin: 0.7 mg/dL (ref 0.3–1.2)
Total Protein: 7.8 g/dL (ref 6.5–8.1)

## 2019-10-28 LAB — PROTIME-INR
INR: 1 (ref 0.8–1.2)
Prothrombin Time: 12.7 seconds (ref 11.4–15.2)

## 2019-10-28 LAB — LACTIC ACID, PLASMA: Lactic Acid, Venous: 1.8 mmol/L (ref 0.5–1.9)

## 2019-10-28 MED ORDER — SODIUM CHLORIDE 0.9 % IV BOLUS
1000.0000 mL | Freq: Once | INTRAVENOUS | Status: AC
  Administered 2019-10-28: 1000 mL via INTRAVENOUS

## 2019-10-28 MED ORDER — SODIUM CHLORIDE 0.9% FLUSH
3.0000 mL | Freq: Once | INTRAVENOUS | Status: AC
  Administered 2019-10-28: 3 mL via INTRAVENOUS

## 2019-10-28 MED ORDER — AZITHROMYCIN 250 MG PO TABS: 250.0000 mg | ORAL_TABLET | Freq: Every day | ORAL | 0 refills | Status: DC

## 2019-10-28 MED ORDER — POTASSIUM CHLORIDE CRYS ER 20 MEQ PO TBCR
40.0000 meq | EXTENDED_RELEASE_TABLET | Freq: Once | ORAL | Status: AC
  Administered 2019-10-28: 40 meq via ORAL
  Filled 2019-10-28: qty 2

## 2019-10-28 MED ORDER — ONDANSETRON HCL 4 MG PO TABS: 4.0000 mg | ORAL_TABLET | Freq: Four times a day (QID) | ORAL | 0 refills | Status: DC

## 2019-10-28 NOTE — ED Notes (Signed)
MD Messick at bedside 

## 2019-10-28 NOTE — Discharge Instructions (Signed)
Please return for any problem.  Follow-up with your health care provider as instructed.  If you feel worse - especially if you develop shortness of breath or other worsening symptoms --  please return to the ED for evaluation.

## 2019-10-28 NOTE — ED Provider Notes (Signed)
[facility] DEPT Provider Note   CSN: [id number] Arrival date & time: 10/28/19  1426     History   Chief Complaint Chief Complaint  Patient presents with  . Fever  . Chills  . possible covid    HPI Dylan Abbott is a 53 y.o. male.     53 year old male with prior medical history as detailed below presents for evaluation of possible Covid.  Patient reports that his roommate tested positive for Covid.  The patient is reporting approximately 5 to 6 days of fever and chills with myalgias at home.  He denies shortness of breath or cough.  Patient is denying chest pain.    The history is provided by the patient and medical records.  Fever Temp source:  Subjective Severity:  Mild Onset quality:  Gradual Duration:  5 days Timing:  Intermittent Progression:  Waxing and waning Chronicity:  New Relieved by:  Nothing Worsened by:  Nothing   Past Medical History:  Diagnosis Date  . Diabetes mellitus without complication ([place])   . Hyperlipidemia   . Hypertension     Patient Active Problem List   Diagnosis Date Noted  . Diabetes mellitus ([place]) 11/13/2017  . Hyperlipidemia 11/13/2017  . Hypertensive disorder 11/13/2017    History reviewed. No pertinent surgical history.      Home Medications    Prior to Admission medications   Medication Sig Start Date End Date Taking? Authorizing Provider  ACCU-CHEK GUIDE test strip USE TO CHECK BLOOD SUGAR ONCE A DAY 10/25/19   [name], MD  azithromycin (ZITHROMAX) 250 MG tablet Take 1 tablet (250 mg total) by mouth daily. Take first 2 tablets together, then 1 every day until finished. 10/28/19   Valarie Merino, MD  blood glucose meter kit and supplies Dispense based on patient and insurance preference. Once per day - fasting or 2 hrs after meal  Dx: E11.65. 07/25/19   [name], MD  cyclobenzaprine (FLEXERIL) 5 MG tablet Take 1 tablet (5 mg total) by mouth at bedtime as needed.  07/25/19   [name], MD  glipiZIDE (GLUCOTROL XL) 10 MG 24 hr tablet TAKE 1 TABLET(10 MG) BY MOUTH TWICE DAILY 10/04/19   [name], MD  Lancets (ACCU-CHEK MULTICLIX) lancets Use as instructed 08/09/19   [name], MD  NIFEdipine (PROCARDIA XL) 90 MG 24 hr tablet Take 1 tablet (90 mg total) by mouth daily. 07/25/19   [name], MD  ondansetron (ZOFRAN) 4 MG tablet Take 1 tablet (4 mg total) by mouth every 6 (six) hours. 10/28/19   Valarie Merino, MD  pioglitazone (ACTOS) 15 MG tablet TAKE 1 TABLET(15 MG) BY MOUTH DAILY 10/04/19   [name], MD  simvastatin (ZOCOR) 40 MG tablet Take 1 tablet (40 mg total) by mouth daily at 6 PM. 07/25/19   [name], MD    Family History No family history on file.  Social History Social History   Tobacco Use  . Smoking status: Never Smoker  . Smokeless tobacco: Never Used  Substance Use Topics  . Alcohol use: Never    Alcohol/week: 0.0 standard drinks    Frequency: Never  . Drug use: Never     Allergies   Patient has no known allergies.   Review of Systems Review of Systems  Constitutional: Positive for fever.  All other systems reviewed and are negative.    Physical Exam Updated Vital Signs BP 128/65   Pulse 85  Temp 99.1 F (37.3 C) (Oral)   Resp 18   Ht 6' (1.829 m)   Wt 103.3 kg   SpO2 96%   BMI 30.88 kg/m   Physical Exam Vitals signs and nursing note reviewed.  Constitutional:      General: He is not in acute distress.    Appearance: Normal appearance. He is well-developed.  HENT:     Head: Normocephalic and atraumatic.  Eyes:     Conjunctiva/sclera: Conjunctivae normal.     Pupils: Pupils are equal, round, and reactive to light.  Neck:     Musculoskeletal: Normal range of motion and neck supple.  Cardiovascular:     Rate and Rhythm: Normal rate and regular rhythm.     Heart sounds: Normal heart sounds.  Pulmonary:     Effort: Pulmonary effort is normal. No respiratory  distress.     Breath sounds: Normal breath sounds.  Abdominal:     General: There is no distension.     Palpations: Abdomen is soft.     Tenderness: There is no abdominal tenderness.  Musculoskeletal: Normal range of motion.        General: No deformity.  Skin:    General: Skin is warm and dry.  Neurological:     General: No focal deficit present.     Mental Status: He is alert and oriented to person, place, and time. Mental status is at baseline.      ED Treatments / Results  Labs (all labs ordered are listed, but only abnormal results are displayed) Labs Reviewed  COMPREHENSIVE METABOLIC PANEL - Abnormal; Notable for the following components:      Result Value   Sodium 133 (*)    Potassium 3.0 (*)    Chloride 95 (*)    Glucose, Bld 228 (*)    Creatinine, Ser 1.43 (*)    Calcium 8.8 (*)    AST 55 (*)    GFR calc non Af Amer 55 (*)    All other components within normal limits  CBC WITH DIFFERENTIAL/PLATELET - Abnormal; Notable for the following components:   HCT 37.5 (*)    Platelets 123 (*)    All other components within normal limits  URINALYSIS, ROUTINE W REFLEX MICROSCOPIC - Abnormal; Notable for the following components:   Glucose, UA >=500 (*)    Protein, ur 30 (*)    Bacteria, UA RARE (*)    All other components within normal limits  CBG MONITORING, ED - Abnormal; Notable for the following components:   Glucose-Capillary 184 (*)    All other components within normal limits  SARS CORONAVIRUS 2 (TAT 6-24 HRS)  LACTIC ACID, PLASMA  PROTIME-INR  LACTIC ACID, PLASMA    EKG None  Radiology Dg Chest 2 View  Result Date: 10/28/2019 CLINICAL DATA:  Cough fever EXAM: CHEST - 2 VIEW COMPARISON:  Report 06/14/2002 FINDINGS: Mild cardiomegaly. Possible patchy peripheral airspace opacity at the right CP angle. No pneumothorax. IMPRESSION: 1. Possible patchy ground-glass opacity at the peripheral right base as may be seen with pneumonia, including atypical or viral  etiology. 2. Mild cardiomegaly Electronically Signed   By: Donavan Foil M.D.   On: 10/28/2019 15:58    Procedures Procedures (including critical care time)  Medications Ordered in ED Medications  sodium chloride flush (NS) 0.9 % injection 3 mL (3 mLs Intravenous Given 10/28/19 1826)  potassium chloride SA (KLOR-CON) CR tablet 40 mEq (40 mEq Oral Given 10/28/19 1926)  sodium chloride 0.9 % bolus 1,000  mL (1,000 mLs Intravenous New Bag/Given 10/28/19 1926)     Initial Impression / Assessment and Plan / ED Course  I have reviewed the triage vital signs and the nursing notes.  Pertinent labs & imaging results that were available during my care of the patient were reviewed by me and considered in my medical decision making (see chart for details).        MDM  Screen complete  Dylan Abbott was evaluated in Emergency Department on 10/28/2019 for the symptoms described in the history of present illness. He was evaluated in the context of the global COVID-19 pandemic, which necessitated consideration that the patient might be at risk for infection with the SARS-CoV-2 virus that causes COVID-19. Institutional protocols and algorithms that pertain to the evaluation of patients at risk for COVID-19 are in a state of rapid change based on information released by regulatory bodies including the CDC and federal and state organizations. These policies and algorithms were followed during the patient's care in the ED.  Patient is presenting for evaluation of possible Covid infection.  Patient with reported COVID positive roommate.  Patient's presentation is not concerning for Covid pneumonia.  Screening labs are on the whole without significant abnormality.  Patient appears to be appropriate for discharge.   He does feel improved following his ED evaluation.   He does understand the need for close follow-up.  Strict return precautions given and understood.  .  Final Clinical Impressions(s) / ED  Diagnoses   Final diagnoses:  Encounter for screening laboratory testing for COVID-19 virus    ED Discharge Orders         Ordered    ondansetron (ZOFRAN) 4 MG tablet  Every 6 hours     10/28/19 2213    azithromycin (ZITHROMAX) 250 MG tablet  Daily     10/28/19 2213           Valarie Merino, MD 10/28/19 2218

## 2019-10-28 NOTE — ED Triage Notes (Signed)
Patient here from home with complaints of fever and chills x1 week. Denies cough. States that he had a COVID test done today.

## 2019-10-29 LAB — SARS CORONAVIRUS 2 (TAT 6-24 HRS): SARS Coronavirus 2: POSITIVE — AB

## 2019-10-30 ENCOUNTER — Other Ambulatory Visit: Payer: Self-pay

## 2019-10-30 ENCOUNTER — Telehealth (INDEPENDENT_AMBULATORY_CARE_PROVIDER_SITE_OTHER): Payer: PRIVATE HEALTH INSURANCE | Admitting: Family Medicine

## 2019-10-30 ENCOUNTER — Emergency Department (HOSPITAL_COMMUNITY): Payer: 59

## 2019-10-30 ENCOUNTER — Inpatient Hospital Stay (HOSPITAL_COMMUNITY)
Admission: EM | Admit: 2019-10-30 | Discharge: 2019-11-04 | DRG: 177 | Disposition: A | Discharge status: Home / Self Care | Payer: 59 | Attending: Internal Medicine | Admitting: Internal Medicine

## 2019-10-30 DIAGNOSIS — R079 Chest pain, unspecified: Secondary | ICD-10-CM | POA: Diagnosis not present

## 2019-10-30 DIAGNOSIS — E1165 Type 2 diabetes mellitus with hyperglycemia: Secondary | ICD-10-CM

## 2019-10-30 DIAGNOSIS — U071 COVID-19: Principal | ICD-10-CM | POA: Diagnosis present

## 2019-10-30 DIAGNOSIS — R0609 Other forms of dyspnea: Secondary | ICD-10-CM

## 2019-10-30 DIAGNOSIS — J1282 Pneumonia due to coronavirus disease 2019: Secondary | ICD-10-CM

## 2019-10-30 DIAGNOSIS — E876 Hypokalemia: Secondary | ICD-10-CM | POA: Diagnosis present

## 2019-10-30 DIAGNOSIS — E871 Hypo-osmolality and hyponatremia: Secondary | ICD-10-CM | POA: Diagnosis present

## 2019-10-30 DIAGNOSIS — Z79899 Other long term (current) drug therapy: Secondary | ICD-10-CM

## 2019-10-30 DIAGNOSIS — Z7984 Long term (current) use of oral hypoglycemic drugs: Secondary | ICD-10-CM

## 2019-10-30 DIAGNOSIS — R945 Abnormal results of liver function studies: Secondary | ICD-10-CM

## 2019-10-30 DIAGNOSIS — R5383 Other fatigue: Secondary | ICD-10-CM | POA: Diagnosis not present

## 2019-10-30 DIAGNOSIS — E119 Type 2 diabetes mellitus without complications: Secondary | ICD-10-CM | POA: Diagnosis present

## 2019-10-30 DIAGNOSIS — J1289 Other viral pneumonia: Secondary | ICD-10-CM | POA: Diagnosis present

## 2019-10-30 DIAGNOSIS — R0600 Dyspnea, unspecified: Secondary | ICD-10-CM

## 2019-10-30 DIAGNOSIS — I10 Essential (primary) hypertension: Secondary | ICD-10-CM | POA: Diagnosis present

## 2019-10-30 DIAGNOSIS — J9601 Acute respiratory failure with hypoxia: Secondary | ICD-10-CM | POA: Diagnosis present

## 2019-10-30 DIAGNOSIS — E785 Hyperlipidemia, unspecified: Secondary | ICD-10-CM | POA: Diagnosis present

## 2019-10-30 DIAGNOSIS — N289 Disorder of kidney and ureter, unspecified: Secondary | ICD-10-CM | POA: Diagnosis present

## 2019-10-30 LAB — LACTATE DEHYDROGENASE: LDH: 252 U/L — ABNORMAL HIGH (ref 98–192)

## 2019-10-30 LAB — FIBRINOGEN: Fibrinogen: 491 mg/dL — ABNORMAL HIGH (ref 210–475)

## 2019-10-30 LAB — FERRITIN: Ferritin: 469 ng/mL — ABNORMAL HIGH (ref 24–336)

## 2019-10-30 LAB — CBC WITH DIFFERENTIAL/PLATELET
Abs Immature Granulocytes: 0.03 10*3/uL (ref 0.00–0.07)
Basophils Absolute: 0 10*3/uL (ref 0.0–0.1)
Basophils Relative: 0 %
Eosinophils Absolute: 0 10*3/uL (ref 0.0–0.5)
Eosinophils Relative: 0 %
HCT: 35.1 % — ABNORMAL LOW (ref 39.0–52.0)
Hemoglobin: 12.5 g/dL — ABNORMAL LOW (ref 13.0–17.0)
Immature Granulocytes: 1 %
Lymphocytes Relative: 21 %
Lymphs Abs: 1.2 10*3/uL (ref 0.7–4.0)
MCH: 30.1 pg (ref 26.0–34.0)
MCHC: 35.6 g/dL (ref 30.0–36.0)
MCV: 84.6 fL (ref 80.0–100.0)
Monocytes Absolute: 0.3 10*3/uL (ref 0.1–1.0)
Monocytes Relative: 6 %
Neutro Abs: 4.2 10*3/uL (ref 1.7–7.7)
Neutrophils Relative %: 72 %
Platelets: 149 10*3/uL — ABNORMAL LOW (ref 150–400)
RBC: 4.15 MIL/uL — ABNORMAL LOW (ref 4.22–5.81)
RDW: 13.3 % (ref 11.5–15.5)
WBC: 5.8 10*3/uL (ref 4.0–10.5)
nRBC: 0 % (ref 0.0–0.2)

## 2019-10-30 LAB — COMPREHENSIVE METABOLIC PANEL
ALT: 30 U/L (ref 0–44)
AST: 57 U/L — ABNORMAL HIGH (ref 15–41)
Albumin: 3.3 g/dL — ABNORMAL LOW (ref 3.5–5.0)
Alkaline Phosphatase: 61 U/L (ref 38–126)
Anion gap: 13 (ref 5–15)
BUN: 15 mg/dL (ref 6–20)
CO2: 23 mmol/L (ref 22–32)
Calcium: 8.5 mg/dL — ABNORMAL LOW (ref 8.9–10.3)
Chloride: 96 mmol/L — ABNORMAL LOW (ref 98–111)
Creatinine, Ser: 1.4 mg/dL — ABNORMAL HIGH (ref 0.61–1.24)
GFR calc Af Amer: >60 mL/min (ref >60)
GFR calc non Af Amer: 57 mL/min — ABNORMAL LOW (ref >60)
Glucose, Bld: 212 mg/dL — ABNORMAL HIGH (ref 70–99)
Potassium: 2.9 mmol/L — ABNORMAL LOW (ref 3.5–5.1)
Sodium: 132 mmol/L — ABNORMAL LOW (ref 135–145)
Total Bilirubin: 1.1 mg/dL (ref 0.3–1.2)
Total Protein: 7 g/dL (ref 6.5–8.1)

## 2019-10-30 LAB — TRIGLYCERIDES: Triglycerides: 99 mg/dL (ref <150)

## 2019-10-30 LAB — C-REACTIVE PROTEIN: CRP: 8.1 mg/dL — ABNORMAL HIGH (ref <1.0)

## 2019-10-30 LAB — D-DIMER, QUANTITATIVE: D-Dimer, Quant: 0.53 ug/mL-FEU — ABNORMAL HIGH (ref 0.00–0.50)

## 2019-10-30 LAB — LACTIC ACID, PLASMA: Lactic Acid, Venous: 1.1 mmol/L (ref 0.5–1.9)

## 2019-10-30 LAB — NOVEL CORONAVIRUS, NAA: SARS-CoV-2, NAA: DETECTED — AB

## 2019-10-30 MED ORDER — ACETAMINOPHEN 325 MG PO TABS
650.0000 mg | ORAL_TABLET | Freq: Once | ORAL | Status: AC
  Administered 2019-10-30: 23:00:00 650 mg via ORAL
  Filled 2019-10-30: qty 2

## 2019-10-30 MED ORDER — SODIUM CHLORIDE 0.9 % IV SOLN
1000.0000 mL | INTRAVENOUS | Status: DC
  Administered 2019-10-30: 23:00:00 1000 mL via INTRAVENOUS

## 2019-10-30 NOTE — Progress Notes (Signed)
Virtual Visit via Telephone Note  I connected with Dylan Abbott on 10/30/19 at 5:39 PM by telephone and verified that I am speaking with the correct person using two identifiers.   I discussed the limitations, risks, security and privacy concerns of performing an evaluation and management service by telephone and the availability of in person appointments. I also discussed with the patient that there may be a patient responsible charge related to this service. The patient expressed understanding and agreed to proceed, consent obtained  Chief complaint: Covid 19  History of Present Illness: Dylan Abbott is a 53 y.o. male  History of hypertension, hyperlipidemia, diabetes.  IOXBD-53 risk of complications score of 4.  ER note reviewed. Evaluated in the emergency room November 9, reported 5 to 6 days of fever and chills and myalgias. covid positive roommate.  Ultimately had positive Covid test.  Denies shortness of breath or chest pain.   Glucose 228.  Sodium 133, potassium 3.0.  Creatinine 1.43. platelets 123.  Normal lactate.  Normal bicarb.  Treated with IV fluids, 40 mg potassium.  Chest x-ray with possible patchy groundglass opacity at the peripheral right base, mild cardiomegaly.  Discharged home on azithromycin, Zofran.  Since ER visit.  Feeling about the same as in ER. Fever - subjective, chills. Bitter taste - unable to eat. Has been drinking water. Coughing spells with shortness of breath, not at rest. Is short of breath with walking - new since at home. Did not have this in ER. Now having chest pain since last yesterday - comes and goes. No syncope/near syncope.  Has not checked blood sugar today - does not have a machine. Feels very tired - more fatigue than in ER.         Patient Active Problem List   Diagnosis Date Noted  . Diabetes mellitus ([place]) 11/13/2017  . Hyperlipidemia 11/13/2017  . Hypertensive disorder 11/13/2017   Past Medical History:  Diagnosis Date   . Diabetes mellitus without complication ([place])   . Hyperlipidemia   . Hypertension    No past surgical history on file. No Known Allergies Prior to Admission medications   Medication Sig Start Date End Date Taking? Authorizing Provider  ACCU-CHEK GUIDE test strip USE TO CHECK BLOOD SUGAR ONCE A DAY 10/25/19   [name], MD  azithromycin (ZITHROMAX) 250 MG tablet Take 1 tablet (250 mg total) by mouth daily. Take first 2 tablets together, then 1 every day until finished. 10/28/19   Valarie Merino, MD  blood glucose meter kit and supplies Dispense based on patient and insurance preference. Once per day - fasting or 2 hrs after meal  Dx: E11.65. 07/25/19   [name], MD  cyclobenzaprine (FLEXERIL) 5 MG tablet Take 1 tablet (5 mg total) by mouth at bedtime as needed. 07/25/19   [name], MD  glipiZIDE (GLUCOTROL XL) 10 MG 24 hr tablet TAKE 1 TABLET(10 MG) BY MOUTH TWICE DAILY 10/04/19   [name], MD  Lancets (ACCU-CHEK MULTICLIX) lancets Use as instructed 08/09/19   [name], MD  NIFEdipine (PROCARDIA XL) 90 MG 24 hr tablet Take 1 tablet (90 mg total) by mouth daily. 07/25/19   [name], MD  ondansetron (ZOFRAN) 4 MG tablet Take 1 tablet (4 mg total) by mouth every 6 (six) hours. 10/28/19   Valarie Merino, MD  pioglitazone (ACTOS) 15 MG tablet TAKE 1 TABLET(15 MG) BY MOUTH DAILY 10/04/19   [name], MD  simvastatin (  ZOCOR) 40 MG tablet Take 1 tablet (40 mg total) by mouth daily at 6 PM. 07/25/19   [name], MD   Social History   Socioeconomic History  . Marital status: Married    Spouse name: Not on file  . Number of children: 2  . Years of education: Not on file  . Highest education level: Not on file  Occupational History  . Occupation: environmental  Social Needs  . Financial resource strain: Not on file  . Food insecurity    Worry: Not on file    Inability: Not on file  . Transportation needs    Medical: Not on file     Non-medical: Not on file  Tobacco Use  . Smoking status: Never Smoker  . Smokeless tobacco: Never Used  Substance and Sexual Activity  . Alcohol use: Never    Alcohol/week: 0.0 standard drinks    Frequency: Never  . Drug use: Never  . Sexual activity: Yes  Lifestyle  . Physical activity    Days per week: Not on file    Minutes per session: Not on file  . Stress: Not on file  Relationships  . Social Herbalist on phone: Not on file    Gets together: Not on file    Attends religious service: Not on file    Active member of club or organization: Not on file    Attends meetings of clubs or organizations: Not on file    Relationship status: Not on file  . Intimate partner violence    Fear of current or ex partner: Not on file    Emotionally abused: Not on file    Physically abused: Not on file    Forced sexual activity: Not on file  Other Topics Concern  . Not on file  Social History Narrative  . Not on file     Observations/Objective: Responding appropriately, no cough during exam.  Short sentences but no apparent respiratory distress on discussion over phone.  Assessment and Plan: COVID-19  Fatigue, unspecified type  Chest pain, unspecified type  DOE (dyspnea on exertion)   -COVID-19 with progressive/worsening symptoms including chest pain, increased dyspnea on exertion, increasing fatigue.  Has been taking azithromycin.  Possible right base pneumonia/groundglass appearance on chest x-ray 2 days ago.  -With progressive symptoms recommended ER visit tonight to make sure he is not requiring hospitalization or change in therapy.  Recommended EMS transport or other ride to ER, not to drive himself.  Understanding expressed.  Follow Up Instructions: To ER tonight.    I discussed the assessment and treatment plan with the patient. The patient was provided an opportunity to ask questions and all were answered. The patient agreed with the plan and demonstrated an  understanding of the instructions.   The patient was advised to call back or seek an in-person evaluation if the symptoms worsen or if the condition fails to improve as anticipated.  I provided 8 minutes of non-face-to-face time during this encounter.  Signed,   Merri Ray, MD [facility] [place].  10/30/19

## 2019-10-30 NOTE — ED Provider Notes (Addendum)
Dylan Abbott   Dylan Abbott Arrival date & time: 10/30/19  2127     History   Chief Complaint Chief Complaint  Patient presents with  . Shortness of Breath  . covid +    HPI Dylan Abbott is a 53 y.o. male.     HPI Patient presents to the ED for evaluation of cough, fevers and shortness of breath.  Patient started having symptoms about a week ago.  He had an exposure to a roommate that was positive for Covid.  Patient's had fevers and chills and myalgias.  He has not measured his temperature.  He was seen in the ED on November 9.  Patient was tested and that result was positive for COVID-19.  Patient presented to the ED this evening because he is now having difficulty with his breathing and feels short of breath.  Patient has not been able to eat well.  He has been drinking.  Patient is feeling very fatigued.  Patient had a visit with his doctor via telemedicine today.  Because of his worsening symptoms they instructed him to go to the emergency room tonight for evaluation. Past Medical History:  Diagnosis Date  . Diabetes mellitus without complication ([place])   . Hyperlipidemia   . Hypertension     Patient Active Problem List   Diagnosis Date Noted  . COVID-19 virus infection 10/31/2019  . Diabetes mellitus ([place]) 11/13/2017  . Hyperlipidemia 11/13/2017  . Hypertensive disorder 11/13/2017    No past surgical history on file.      Home Medications    Prior to Admission medications   Medication Sig Start Date End Date Taking? Authorizing Provider  ACCU-CHEK GUIDE test strip USE TO CHECK BLOOD SUGAR ONCE A DAY 10/25/19   [name], MD  azithromycin (ZITHROMAX) 250 MG tablet Take 1 tablet (250 mg total) by mouth daily. Take first 2 tablets together, then 1 every day until finished. 10/28/19   Valarie Merino, MD  blood glucose meter kit and supplies Dispense based on patient and insurance preference. Once per day  - fasting or 2 hrs after meal  Dx: E11.65. 07/25/19   [name], MD  cyclobenzaprine (FLEXERIL) 5 MG tablet Take 1 tablet (5 mg total) by mouth at bedtime as needed. 07/25/19   [name], MD  glipiZIDE (GLUCOTROL XL) 10 MG 24 hr tablet TAKE 1 TABLET(10 MG) BY MOUTH TWICE DAILY 10/04/19   [name], MD  Lancets (ACCU-CHEK MULTICLIX) lancets Use as instructed 08/09/19   [name], MD  NIFEdipine (PROCARDIA XL) 90 MG 24 hr tablet Take 1 tablet (90 mg total) by mouth daily. 07/25/19   [name], MD  ondansetron (ZOFRAN) 4 MG tablet Take 1 tablet (4 mg total) by mouth every 6 (six) hours. 10/28/19   Valarie Merino, MD  pioglitazone (ACTOS) 15 MG tablet TAKE 1 TABLET(15 MG) BY MOUTH DAILY 10/04/19   [name], MD  simvastatin (ZOCOR) 40 MG tablet Take 1 tablet (40 mg total) by mouth daily at 6 PM. 07/25/19   [name], MD    Family History No family history on file.  Social History Social History   Tobacco Use  . Smoking status: Never Smoker  . Smokeless tobacco: Never Used  Substance Use Topics  . Alcohol use: Never    Alcohol/week: 0.0 standard drinks    Frequency: Never  . Drug use: Never     Allergies  Patient has no known allergies.   Review of Systems Review of Systems  All other systems reviewed and are negative.    Physical Exam Updated Vital Signs BP 115/68   Pulse 83   Temp (!) 102.9 F (39.4 C) (Oral)   Resp 17   Ht 1.829 m (6')   Wt 103 kg   SpO2 93%   BMI 30.79 kg/m   Physical Exam Vitals signs and nursing Abbott reviewed.  Constitutional:      General: He is not in acute distress.    Appearance: He is well-developed. He is ill-appearing.  HENT:     Head: Normocephalic and atraumatic.     Right Ear: External ear normal.     Left Ear: External ear normal.  Eyes:     General: No scleral icterus.       Right eye: No discharge.        Left eye: No discharge.     Conjunctiva/sclera: Conjunctivae normal.   Neck:     Musculoskeletal: Neck supple.     Trachea: No tracheal deviation.  Cardiovascular:     Rate and Rhythm: Normal rate and regular rhythm.  Pulmonary:     Effort: Pulmonary effort is normal. No respiratory distress.     Breath sounds: Normal breath sounds. No stridor. No wheezing or rales.     Comments: No hypoxia noted at bedside, no accessory muscle use Abdominal:     General: Bowel sounds are normal. There is no distension.     Palpations: Abdomen is soft.     Tenderness: There is no abdominal tenderness. There is no guarding or rebound.  Musculoskeletal:        General: No tenderness.  Skin:    General: Skin is warm and dry.     Findings: No rash.  Neurological:     Mental Status: He is alert.     Cranial Nerves: No cranial nerve deficit (no facial droop, extraocular movements intact, no slurred speech).     Sensory: No sensory deficit.     Motor: No abnormal muscle tone or seizure activity.     Coordination: Coordination normal.      ED Treatments / Results  Labs (all labs ordered are listed, but only abnormal results are displayed) Labs Reviewed  CBC WITH DIFFERENTIAL/PLATELET - Abnormal; Notable for the following components:      Result Value   RBC 4.15 (*)    Hemoglobin 12.5 (*)    HCT 35.1 (*)    Platelets 149 (*)    All other components within normal limits  COMPREHENSIVE METABOLIC PANEL - Abnormal; Notable for the following components:   Sodium 132 (*)    Potassium 2.9 (*)    Chloride 96 (*)    Glucose, Bld 212 (*)    Creatinine, Ser 1.40 (*)    Calcium 8.5 (*)    Albumin 3.3 (*)    AST 57 (*)    GFR calc non Af Amer 57 (*)    All other components within normal limits  D-DIMER, QUANTITATIVE (NOT AT [facility]) - Abnormal; Notable for the following components:   D-Dimer, Quant 0.53 (*)    All other components within normal limits  LACTATE DEHYDROGENASE - Abnormal; Notable for the following components:   LDH 252 (*)    All other components within  normal limits  FERRITIN - Abnormal; Notable for the following components:   Ferritin 469 (*)    All other components within normal limits  FIBRINOGEN -  Abnormal; Notable for the following components:   Fibrinogen 491 (*)    All other components within normal limits  C-REACTIVE PROTEIN - Abnormal; Notable for the following components:   CRP 8.1 (*)    All other components within normal limits  CULTURE, BLOOD (ROUTINE X 2)  CULTURE, BLOOD (ROUTINE X 2)  LACTIC ACID, PLASMA  TRIGLYCERIDES  LACTIC ACID, PLASMA  PROCALCITONIN  HIV ANTIBODY (ROUTINE TESTING W REFLEX)  STREP PNEUMONIAE URINARY ANTIGEN  LEGIONELLA PNEUMOPHILA SEROGP 1 UR AG  ABO/RH    EKG EKG Interpretation  Date/Time:  Thursday October 31 2019 00:37:16 EST Ventricular Rate:  80 PR Interval:    QRS Duration: 101 QT Interval:  391 QTC Calculation: 451 R Axis:   -4 Text Interpretation: Sinus rhythm Prolonged PR interval Left ventricular hypertrophy No significant change since last tracing Confirmed by Dorie Rank [phone]) on 10/31/2019 12:41:56 AM   Radiology Dg Chest Port 1 View  Result Date: 10/30/2019 CLINICAL DATA:  COVID-19 positivity with fever, initial encounter EXAM: PORTABLE CHEST 1 VIEW COMPARISON:  10/28/2019 FINDINGS: Cardiac shadow is enlarged but stable. The lungs are well aerated bilaterally. Increasing bibasilar airspace opacity is noted consistent with the given clinical history. No sizable effusion is noted. No bony abnormality is seen. IMPRESSION: Increasing airspace opacity in the bases right greater than left consistent with the COVID-19 positivity. Electronically Signed   By: Inez Catalina M.D.   On: 10/30/2019 23:05    Procedures .Critical Care Performed by: Dorie Rank, MD Authorized by: Dorie Rank, MD   Critical care provider statement:    Critical care time (minutes):  45   Critical care was time spent personally by me on the following activities:  Discussions with consultants, evaluation  of patient's response to treatment, examination of patient, ordering and performing treatments and interventions, ordering and review of laboratory studies, ordering and review of radiographic studies, pulse oximetry, re-evaluation of patient's condition, obtaining history from patient or surrogate and review of old charts   (including critical care time)  Medications Ordered in ED Medications  0.9 %  sodium chloride infusion (1,000 mLs Intravenous New Bag/Given 10/30/19 2314)  dexamethasone (DECADRON) injection 6 mg (has no administration in time range)  potassium chloride SA (KLOR-CON) CR tablet 40 mEq (has no administration in time range)  enoxaparin (LOVENOX) injection 40 mg (has no administration in time range)  dexamethasone (DECADRON) injection 6 mg (has no administration in time range)  cefTRIAXone (ROCEPHIN) 1 g in sodium chloride 0.9 % 100 mL IVPB (has no administration in time range)  azithromycin (ZITHROMAX) 500 mg in sodium chloride 0.9 % 250 mL IVPB (has no administration in time range)  remdesivir 200 mg in sodium chloride 0.9 % 250 mL IVPB (has no administration in time range)  remdesivir 100 mg in sodium chloride 0.9 % 250 mL IVPB (has no administration in time range)  insulin aspart (novoLOG) injection 0-9 Units (has no administration in time range)  insulin aspart (novoLOG) injection 0-5 Units (has no administration in time range)  acetaminophen (TYLENOL) tablet 650 mg (650 mg Oral Given 10/30/19 2310)     Initial Impression / Assessment and Plan / ED Course  I have reviewed the triage vital signs and the nursing notes.  Pertinent labs & imaging results that were available during my care of the patient were reviewed by me and considered in my medical decision making (see chart for details).   Patient presents with worsening Covid.  Chest x-ray now shows pneumonia.  Oxygen  saturation is on the low 90s at rest.  Patient with hypokalemia and elevated inflammatory markers.   Will start on IV Decadron.  Blood sugar will need to be monitored closely with his diabetes.  Plan admission for further treatment.  Final Clinical Impressions(s) / ED Diagnoses   Final diagnoses:  Pneumonia due to COVID-19 virus      Dorie Rank, MD 10/31/19 [id number]    Dorie Rank, MD 10/31/19 [id number]

## 2019-10-30 NOTE — Progress Notes (Signed)
Pt has tested pos for covid, having major chills, some fever, bitterness in taste, unable to eat and fatigue. Also mentioned some chest pain. He is keeping himself away from others, does not live alone. Was given the zpak and zofran for the sx. Says he is not sure if it is helping at all. Pharmacy has been verified, he is still monitoring his blood sugar during this time. Had not done so today as of yet. Will have reading when pcp calls

## 2019-10-30 NOTE — Patient Instructions (Addendum)
Advised to be seen in ER tonight. Plans to call someone to take him. EMS option discussed.

## 2019-10-31 ENCOUNTER — Other Ambulatory Visit: Payer: Self-pay

## 2019-10-31 ENCOUNTER — Inpatient Hospital Stay (HOSPITAL_COMMUNITY): Payer: 59

## 2019-10-31 ENCOUNTER — Encounter (HOSPITAL_COMMUNITY): Payer: Self-pay

## 2019-10-31 DIAGNOSIS — N289 Disorder of kidney and ureter, unspecified: Secondary | ICD-10-CM | POA: Diagnosis present

## 2019-10-31 DIAGNOSIS — E119 Type 2 diabetes mellitus without complications: Secondary | ICD-10-CM | POA: Diagnosis present

## 2019-10-31 DIAGNOSIS — E871 Hypo-osmolality and hyponatremia: Secondary | ICD-10-CM | POA: Diagnosis present

## 2019-10-31 DIAGNOSIS — U071 COVID-19: Secondary | ICD-10-CM | POA: Diagnosis present

## 2019-10-31 DIAGNOSIS — E785 Hyperlipidemia, unspecified: Secondary | ICD-10-CM | POA: Diagnosis present

## 2019-10-31 DIAGNOSIS — E1165 Type 2 diabetes mellitus with hyperglycemia: Secondary | ICD-10-CM | POA: Diagnosis not present

## 2019-10-31 DIAGNOSIS — Z79899 Other long term (current) drug therapy: Secondary | ICD-10-CM | POA: Diagnosis not present

## 2019-10-31 DIAGNOSIS — R945 Abnormal results of liver function studies: Secondary | ICD-10-CM | POA: Diagnosis present

## 2019-10-31 DIAGNOSIS — E876 Hypokalemia: Secondary | ICD-10-CM | POA: Diagnosis present

## 2019-10-31 DIAGNOSIS — Z7984 Long term (current) use of oral hypoglycemic drugs: Secondary | ICD-10-CM | POA: Diagnosis not present

## 2019-10-31 DIAGNOSIS — J1289 Other viral pneumonia: Secondary | ICD-10-CM | POA: Diagnosis present

## 2019-10-31 DIAGNOSIS — J9601 Acute respiratory failure with hypoxia: Secondary | ICD-10-CM | POA: Diagnosis present

## 2019-10-31 DIAGNOSIS — I10 Essential (primary) hypertension: Secondary | ICD-10-CM | POA: Diagnosis present

## 2019-10-31 LAB — PROCALCITONIN: Procalcitonin: <0.1 ng/mL

## 2019-10-31 LAB — HEPATITIS PANEL, ACUTE
HCV Ab: NONREACTIVE
Hep A IgM: NONREACTIVE
Hep B C IgM: NONREACTIVE
Hepatitis B Surface Ag: NONREACTIVE

## 2019-10-31 LAB — CBG MONITORING, ED
Glucose-Capillary: 180 mg/dL — ABNORMAL HIGH (ref 70–99)
Glucose-Capillary: 206 mg/dL — ABNORMAL HIGH (ref 70–99)
Glucose-Capillary: 287 mg/dL — ABNORMAL HIGH (ref 70–99)
Glucose-Capillary: 298 mg/dL — ABNORMAL HIGH (ref 70–99)

## 2019-10-31 LAB — CK: Total CK: 411 U/L — ABNORMAL HIGH (ref 49–397)

## 2019-10-31 LAB — HIV ANTIBODY (ROUTINE TESTING W REFLEX): HIV Screen 4th Generation wRfx: NONREACTIVE

## 2019-10-31 LAB — ABO/RH: ABO/RH(D): A POS

## 2019-10-31 LAB — STREP PNEUMONIAE URINARY ANTIGEN: Strep Pneumo Urinary Antigen: NEGATIVE

## 2019-10-31 LAB — GLUCOSE, CAPILLARY: Glucose-Capillary: 347 mg/dL — ABNORMAL HIGH (ref 70–99)

## 2019-10-31 MED ORDER — SODIUM CHLORIDE 0.9 % IV SOLN
500.0000 mg | INTRAVENOUS | Status: DC
  Administered 2019-10-31 (×2): 500 mg via INTRAVENOUS
  Filled 2019-10-31 (×2): qty 500

## 2019-10-31 MED ORDER — IOHEXOL 350 MG/ML SOLN
100.0000 mL | Freq: Once | INTRAVENOUS | Status: AC | PRN
  Administered 2019-10-31: 02:00:00 100 mL via INTRAVENOUS

## 2019-10-31 MED ORDER — SODIUM CHLORIDE 0.9 % IV SOLN
200.0000 mg | Freq: Once | INTRAVENOUS | Status: AC
  Administered 2019-10-31: 04:00:00 200 mg via INTRAVENOUS
  Filled 2019-10-31: qty 40

## 2019-10-31 MED ORDER — SIMVASTATIN 20 MG PO TABS
40.0000 mg | ORAL_TABLET | Freq: Every day | ORAL | Status: DC
  Administered 2019-10-31 – 2019-11-03 (×4): 40 mg via ORAL
  Filled 2019-10-31 (×2): qty 2
  Filled 2019-10-31: qty 1
  Filled 2019-10-31 (×2): qty 2

## 2019-10-31 MED ORDER — POTASSIUM CHLORIDE CRYS ER 20 MEQ PO TBCR
40.0000 meq | EXTENDED_RELEASE_TABLET | Freq: Once | ORAL | Status: AC
  Administered 2019-10-31: 01:00:00 40 meq via ORAL
  Filled 2019-10-31: qty 2

## 2019-10-31 MED ORDER — NIFEDIPINE ER OSMOTIC RELEASE 90 MG PO TB24
90.0000 mg | ORAL_TABLET | Freq: Every day | ORAL | Status: DC
  Administered 2019-10-31 – 2019-11-04 (×5): 90 mg via ORAL
  Filled 2019-10-31 (×6): qty 1

## 2019-10-31 MED ORDER — DEXAMETHASONE SODIUM PHOSPHATE 10 MG/ML IJ SOLN
6.0000 mg | Freq: Every day | INTRAMUSCULAR | Status: DC
  Administered 2019-10-31 – 2019-11-02 (×3): 6 mg via INTRAVENOUS
  Filled 2019-10-31 (×3): qty 1

## 2019-10-31 MED ORDER — ZINC SULFATE 220 (50 ZN) MG PO CAPS
220.0000 mg | ORAL_CAPSULE | Freq: Every day | ORAL | Status: DC
  Administered 2019-10-31 – 2019-11-04 (×5): 220 mg via ORAL
  Filled 2019-10-31 (×5): qty 1

## 2019-10-31 MED ORDER — PIOGLITAZONE HCL 15 MG PO TABS
15.0000 mg | ORAL_TABLET | Freq: Every day | ORAL | Status: DC
  Administered 2019-10-31: 14:00:00 15 mg via ORAL
  Filled 2019-10-31 (×2): qty 1

## 2019-10-31 MED ORDER — ENOXAPARIN SODIUM 40 MG/0.4ML ~~LOC~~ SOLN
40.0000 mg | Freq: Every day | SUBCUTANEOUS | Status: DC
  Administered 2019-10-31 – 2019-11-04 (×5): 40 mg via SUBCUTANEOUS
  Filled 2019-10-31 (×5): qty 0.4

## 2019-10-31 MED ORDER — GLIPIZIDE ER 10 MG PO TB24
10.0000 mg | ORAL_TABLET | Freq: Every day | ORAL | Status: DC
  Administered 2019-10-31 – 2019-11-04 (×5): 10 mg via ORAL
  Filled 2019-10-31 (×6): qty 1

## 2019-10-31 MED ORDER — INSULIN ASPART 100 UNIT/ML ~~LOC~~ SOLN
0.0000 [IU] | Freq: Every day | SUBCUTANEOUS | Status: DC
  Administered 2019-10-31: 22:00:00 4 [IU] via SUBCUTANEOUS
  Filled 2019-10-31: qty 0.05

## 2019-10-31 MED ORDER — POTASSIUM CHLORIDE IN NACL 20-0.9 MEQ/L-% IV SOLN
INTRAVENOUS | Status: DC
  Administered 2019-10-31 (×2): via INTRAVENOUS
  Filled 2019-10-31: qty 1000

## 2019-10-31 MED ORDER — VITAMIN C 500 MG PO TABS
500.0000 mg | ORAL_TABLET | Freq: Every day | ORAL | Status: DC
  Administered 2019-10-31 – 2019-11-04 (×5): 500 mg via ORAL
  Filled 2019-10-31 (×5): qty 1

## 2019-10-31 MED ORDER — SODIUM CHLORIDE (PF) 0.9 % IJ SOLN
INTRAMUSCULAR | Status: AC
  Administered 2019-10-31: 02:00:00
  Filled 2019-10-31: qty 50

## 2019-10-31 MED ORDER — SODIUM CHLORIDE 0.9 % IV SOLN
100.0000 mg | INTRAVENOUS | Status: DC
  Administered 2019-10-31: 17:00:00 100 mg via INTRAVENOUS
  Filled 2019-10-31 (×2): qty 20

## 2019-10-31 MED ORDER — INSULIN ASPART 100 UNIT/ML ~~LOC~~ SOLN
0.0000 [IU] | Freq: Three times a day (TID) | SUBCUTANEOUS | Status: DC
  Administered 2019-10-31: 5 [IU] via SUBCUTANEOUS
  Administered 2019-10-31 (×2): 3 [IU] via SUBCUTANEOUS
  Filled 2019-10-31: qty 0.09

## 2019-10-31 MED ORDER — DEXAMETHASONE SODIUM PHOSPHATE 10 MG/ML IJ SOLN
6.0000 mg | Freq: Once | INTRAMUSCULAR | Status: AC
  Administered 2019-10-31: 6 mg via INTRAVENOUS
  Filled 2019-10-31: qty 1

## 2019-10-31 MED ORDER — CYCLOBENZAPRINE HCL 10 MG PO TABS: 5.0000 mg | ORAL_TABLET | Freq: Every evening | ORAL | Status: DC | PRN

## 2019-10-31 MED ORDER — SODIUM CHLORIDE 0.9 % IV SOLN
1.0000 g | Freq: Every day | INTRAVENOUS | Status: DC
  Administered 2019-10-31 (×2): 1 g via INTRAVENOUS
  Filled 2019-10-31 (×2): qty 10

## 2019-10-31 NOTE — ED Notes (Signed)
[facility] called for patient transport to [place]. Attempted report to [facility], but RN not available at this time. Will reattempt.

## 2019-10-31 NOTE — ED Notes (Signed)
Ultrasound at bedside

## 2019-10-31 NOTE — ED Notes (Signed)
Pt refused straight stick for blood. He states "Blood has already been taken, I am not going to be stuck again". Will continue to monitor.

## 2019-10-31 NOTE — ED Notes (Signed)
Patient transported to CT 

## 2019-10-31 NOTE — ED Notes (Signed)
Report attempted at [facility] for a second time. Direct nurse number and front desk number not answering.

## 2019-10-31 NOTE — ED Notes (Signed)
EKG given to EDP,Knapp,J. MD., for review. 

## 2019-10-31 NOTE — Progress Notes (Signed)
Pharmacy Brief Note   O:  ALT: 30 CXR: increasing airspace opacity in the bases R>L SpO2: 93   A/P:  Patient meets requirements for remdesivir therapy Yes.  Will start  remdesivir 200 mg IV x 1  followed by 100 mg IV daily x 4 days.  Monitor ALT   Dorrene German 10/31/2019 12:31 AM

## 2019-10-31 NOTE — H&P (Addendum)
TRH H&P    Patient Demographics:    Dylan Abbott, is a 53 y.o. male  MRN: [id number]  DOB - 18-Jan-1966  Admit Date - 10/30/2019  Referring MD/NP/PA:    Outpatient Primary MD for the patient is [name], MD  Patient coming from:  home  Chief complaint- dyspnea   HPI:    Dylan Abbott  is a 53 y.o. male,w hypertension, hyperlipidemia, Dm2, apparently covid-19 positive c/o dyspnea and sent by PCP to ED. Pt notes fever for the past 2 days along with cough (dry), and dyspnea.  Pt denies cp, palp, n/v, diarrhea, alteration in sense of taste and smell.    In Ed,  T 102.9 P 86, R 24, Bp 133/79 pox 90% on RA Wt 103kg  CXR IMPRESSION: Increasing airspace opacity in the bases right greater than left consistent with the COVID-19 positivity.  Wbc 5.8, Hgb 12.5, Plt 149 Na 132, K 2.9, Bun 15, Creatinne 1.40 Ast 57, Alt 30  D dimer 0.53 LDH 252 Crp 8.1 Ferritin 469  Ekg nsr at 80, nl axis, nl int, no st-t changes c/w ischemia  Pt given dexamethasone 101m iv x1 in ED.   Pt will be admitted for pneumonia, CAP/Covid-19     Review of systems:    In addition to the HPI above,   No Headache, No changes with Vision or hearing, No problems swallowing food or Liquids, No Chest pain,  No Abdominal pain, No Nausea or Vomiting, bowel movements are regular, No Blood in stool or Urine, No dysuria, No new skin rashes or bruises, No new joints pains-aches,  No new weakness, tingling, numbness in any extremity, No recent weight gain or loss, No polyuria, polydypsia or polyphagia, No significant Mental Stressors.  All other systems reviewed and are negative.    Past History of the following :    Past Medical History:  Diagnosis Date  . [place] mellitus without complication [place]   . Hyperlipidemia   . Hypertension       No past surgical history on file. None per patient   Social  History:      Social History   Tobacco Use  . Smoking status: Never Smoker  . Smokeless tobacco: Never Used  Substance Use Topics  . Alcohol use: Never    Alcohol/week: 0.0 standard drinks    Frequency: Never       Family History :    No family history on file.  negative for CAD   Home Medications:   Prior to Admission medications   Medication Sig Start Date End Date Taking? Authorizing Provider  ACCU-CHEK GUIDE test strip USE TO CHECK BLOOD SUGAR ONCE A DAY 10/25/19   GWendie Agreste MD  azithromycin (ZITHROMAX) 250 MG tablet Take 1 tablet (250 mg total) by mouth daily. Take first 2 tablets together, then 1 every day until finished. 10/28/19   MValarie Merino MD  blood glucose meter kit and supplies Dispense based on patient and insurance preference. Once per day - fasting or 2 hrs after  meal  Dx: E11.65. 07/25/19   [name], MD  cyclobenzaprine (FLEXERIL) 5 MG tablet Take 1 tablet (5 mg total) by mouth at bedtime as needed. 07/25/19   [name], MD  glipiZIDE (GLUCOTROL XL) 10 MG 24 hr tablet TAKE 1 TABLET(10 MG) BY MOUTH TWICE DAILY 10/04/19   [name], MD  Lancets (ACCU-CHEK MULTICLIX) lancets Use as instructed 08/09/19   [name], MD  NIFEdipine (PROCARDIA XL) 90 MG 24 hr tablet Take 1 tablet (90 mg total) by mouth daily. 07/25/19   [name], MD  ondansetron (ZOFRAN) 4 MG tablet Take 1 tablet (4 mg total) by mouth every 6 (six) hours. 10/28/19   Valarie Merino, MD  pioglitazone (ACTOS) 15 MG tablet TAKE 1 TABLET(15 MG) BY MOUTH DAILY 10/04/19   [name], MD  simvastatin (ZOCOR) 40 MG tablet Take 1 tablet (40 mg total) by mouth daily at 6 PM. 07/25/19   [name], MD     Allergies:    No Known Allergies   Physical Exam:   Vitals  Blood pressure 115/68, pulse 83, temperature (!) 102.9 F (39.4 C), temperature source Oral, resp. rate 17, height 6' (1.829 m), weight 103 kg, SpO2 93 %.  1.  General: axoxo3  2.  Psychiatric: euthymic  3. Neurologic: cn2-12 intact, reflexes 2+ symmetric, diffuse with no clonus, motor 5/5 in all 4 ext  4. HEENMT:  Anicteric, pupils 1.84m symmetric, direct, consensual, near intact Neck: no jvd  5. Respiratory : Few bibasilar crackles. No wheezing  6. Cardiovascular : rrr s1, s2,   7. Gastrointestinal:  Abd: soft, nt, nd, +bs  8. Skin:  Ext: no c/c/e, no rash  9.Musculoskeletal:  Good ROM    Data Review:    CBC Recent Labs  Lab 10/28/19 1825 10/30/19 2237  WBC 4.6 5.8  HGB 13.4 12.5*  HCT 37.5* 35.1*  PLT 123* 149*  MCV 85.6 84.6  MCH 30.6 30.1  MCHC 35.7 35.6  RDW 13.2 13.3  LYMPHSABS 1.5 1.2  MONOABS 0.4 0.3  EOSABS 0.0 0.0  BASOSABS 0.0 0.0   ------------------------------------------------------------------------------------------------------------------  Results for orders placed or performed during the hospital encounter of 10/30/19 (from the past 48 hour(s))  Lactic acid, plasma     Status: None   Collection Time: 10/30/19 10:37 PM  Result Value Ref Range   Lactic Acid, Venous 1.1 0.5 - 1.9 mmol/L    Comment: Performed at [facility] [address], [place] [address] CBC WITH DIFFERENTIAL     Status: Abnormal   Collection Time: 10/30/19 10:37 PM  Result Value Ref Range   WBC 5.8 4.0 - 10.5 K/uL   RBC 4.15 (L) 4.22 - 5.81 MIL/uL   Hemoglobin 12.5 (L) 13.0 - 17.0 g/dL   HCT 35.1 (L) 39.0 - 52.0 %   MCV 84.6 80.0 - 100.0 fL   MCH 30.1 26.0 - 34.0 pg   MCHC 35.6 30.0 - 36.0 g/dL   RDW 13.3 11.5 - 15.5 %   Platelets 149 (L) 150 - 400 K/uL   nRBC 0.0 0.0 - 0.2 %   Neutrophils Relative % 72 %   Neutro Abs 4.2 1.7 - 7.7 K/uL   Lymphocytes Relative 21 %   Lymphs Abs 1.2 0.7 - 4.0 K/uL   Monocytes Relative 6 %   Monocytes Absolute 0.3 0.1 - 1.0 K/uL   Eosinophils Relative 0 %   Eosinophils Absolute 0.0 0.0 - 0.5  K/uL   Basophils Relative 0 %   Basophils Absolute 0.0 0.0 - 0.1 K/uL   Immature  Granulocytes 1 %   Abs Immature Granulocytes 0.03 0.00 - 0.07 K/uL    Comment: Performed at [facility], [place] [address] [place], [place] [address]  Comprehensive metabolic panel     Status: Abnormal   Collection Time: 10/30/19 10:37 PM  Result Value Ref Range   Sodium 132 (L) 135 - 145 mmol/L   Potassium 2.9 (L) 3.5 - 5.1 mmol/L   Chloride 96 (L) 98 - 111 mmol/L   CO2 23 22 - 32 mmol/L   Glucose, Bld 212 (H) 70 - 99 mg/dL   BUN 15 6 - 20 mg/dL   Creatinine, Ser 1.40 (H) 0.61 - 1.24 mg/dL   Calcium 8.5 (L) 8.9 - 10.3 mg/dL   Total Protein 7.0 6.5 - 8.1 g/dL   Albumin 3.3 (L) 3.5 - 5.0 g/dL   AST 57 (H) 15 - 41 U/L   ALT 30 0 - 44 U/L   Alkaline Phosphatase 61 38 - 126 U/L   Total Bilirubin 1.1 0.3 - 1.2 mg/dL   GFR calc non Af Amer 57 (L) >60 mL/min   GFR calc Af Amer >60 >60 mL/min   Anion gap 13 5 - 15    Comment: Performed at [facility], [place] [address] [place], [place] [address]  D-dimer, quantitative     Status: Abnormal   Collection Time: 10/30/19 10:37 PM  Result Value Ref Range   D-Dimer, Quant 0.53 (H) 0.00 - 0.50 ug/mL-FEU    Comment: (NOTE) At the manufacturer cut-off of 0.50 ug/mL FEU, this assay has been documented to exclude PE with a sensitivity and negative predictive value of 97 to 99%.  At this time, this assay has not been approved by the FDA to exclude DVT/VTE. Results should be correlated with clinical presentation. Performed at [facility], [place] [address], [place], [place] [address]   Lactate dehydrogenase     Status: Abnormal   Collection Time: 10/30/19 10:37 PM  Result Value Ref Range   LDH 252 (H) 98 - 192 U/L    Comment: Performed at [facility], [place] [address], [place], [place] [address]  Fibrinogen     Status: Abnormal   Collection Time: 10/30/19 10:37 PM  Result Value Ref Range   Fibrinogen 491 (H) 210 - 475 mg/dL    Comment: Performed at [facility], [place] [address] [place], [place] [address]  Ferritin     Status: Abnormal   Collection Time: 10/30/19 10:46 PM  Result Value Ref Range   Ferritin 469 (H) 24 - 336 ng/mL    Comment: Performed at [facility], [place] [address], [place], [place] [address]  Triglycerides     Status: None   Collection Time: 10/30/19 10:46 PM  Result Value Ref Range   Triglycerides 99 <150 mg/dL    Comment: Performed at [facility], [place] [address] [place], [place] [address]  C-reactive protein     Status: Abnormal   Collection Time: 10/30/19 10:46 PM  Result Value Ref Range   CRP 8.1 (H) <1.0 mg/dL    Comment: Performed at [facility], [place] [address] [place], [place] [address]    Chemistries  Recent Labs  Lab 10/28/19 1825 10/30/19 2237  NA 133* 132*  K 3.0* 2.9*  CL 95* 96*  CO2 26 23  GLUCOSE 228* 212*  BUN 15 15  CREATININE 1.43* 1.40*  CALCIUM 8.8* 8.5*  AST 55* 57*  ALT 31 30  ALKPHOS 58 61  BILITOT 0.7 1.1   ------------------------------------------------------------------------------------------------------------------  ------------------------------------------------------------------------------------------------------------------ GFR: Estimated Creatinine Clearance: 74.9 mL/min (A) (by C-G formula based on SCr of 1.4 mg/dL (H)). Liver Function Tests: Recent Labs  Lab 10/28/19 1825 10/30/19 2237  AST 55* 57*  ALT 31 30  ALKPHOS 58 61  BILITOT 0.7 1.1  PROT 7.8 7.0  ALBUMIN 3.8 3.3*   No results for input(s): LIPASE, AMYLASE in the last 168 hours. No results for input(s): AMMONIA in the last 168 hours. Coagulation Profile: Recent Labs  Lab 10/28/19 1825  INR 1.0   Cardiac Enzymes: No results for input(s): CKTOTAL, CKMB, CKMBINDEX, TROPONINI in the last 168 hours. BNP (last 3 results) No results for input(s): PROBNP in the last 8760 hours. HbA1C: No results for input(s): HGBA1C in the last 72  hours. CBG: Recent Labs  Lab 10/28/19 2100  GLUCAP 184*   Lipid Profile: Recent Labs    10/30/19 2246  TRIG 99   Thyroid Function Tests: No results for input(s): TSH, T4TOTAL, FREET4, T3FREE, THYROIDAB in the last 72 hours. Anemia Panel: Recent Labs    10/30/19 2246  FERRITIN 469*    --------------------------------------------------------------------------------------------------------------- Urine analysis:    Component Value Date/Time   COLORURINE YELLOW 10/28/2019 1743   APPEARANCEUR CLEAR 10/28/2019 1743   LABSPEC 1.018 10/28/2019 1743   PHURINE 6.0 10/28/2019 1743   GLUCOSEU >=500 (A) 10/28/2019 1743   HGBUR NEGATIVE 10/28/2019 1743   BILIRUBINUR NEGATIVE 10/28/2019 1743   BILIRUBINUR negative 08/09/2019 [place] 10/28/2019 1743   PROTEINUR 30 (A) 10/28/2019 1743   UROBILINOGEN 0.2 08/09/2019 1744   UROBILINOGEN 0.2 10/17/2009 0745   NITRITE NEGATIVE 10/28/2019 1743   LEUKOCYTESUR NEGATIVE 10/28/2019 1743      Imaging Results:    Dg Chest Port 1 View  Result Date: 10/30/2019 CLINICAL DATA:  COVID-19 positivity with fever, initial encounter EXAM: PORTABLE CHEST 1 VIEW COMPARISON:  10/28/2019 FINDINGS: Cardiac shadow is enlarged but stable. The lungs are well aerated bilaterally. Increasing bibasilar airspace opacity is noted consistent with the given clinical history. No sizable effusion is noted. No bony abnormality is seen. IMPRESSION: Increasing airspace opacity in the bases right greater than left consistent with the COVID-19 positivity. Electronically Signed   By: Inez Catalina M.D.   On: 10/30/2019 23:05       Assessment & Plan:    Principal Problem:   COVID-19 virus infection Active Problems:   Diabetes mellitus ([place])   Hyperlipidemia  Dyspnea secondary to CAP/ Covid-19 infection Blood culture x2 Urine strep antigen, urine legionella antigen Rocephin 1gm iv qday, zithromax 532m iv qday Dexamethasone 638miv qday Remdesivir  consult  D dimer elevation Check CTA chest r/o PE  Hypokalemia Replete Check cmp in am  Hyponatremia Hydrate with ns iv Check cmp in am  Abnormal liver function Check Acute hepatitis panel Check RUQ ultrasound Check cmp in am  Dm2 Cont Glucotrol XL Cont Actos 1566mo qday (may need to hold if liver function worsens as actos can cause abnormal liver function) fsbs ac and qhs, ISS  Hyperlipidemia Cont Simvastatin 54m105m qhs Monitor liver function, may need to hold if worsens Check cmp in am  Hypertension Cont Nifedipine 90mg41mqday  Renal insufficiency (mild) Hydrate with ns iv Check cmp in am  DVT Prophylaxis-   Lovenox - SCDs   AM  Labs Ordered, also please review Full Orders  Family Communication: Admission, patients condition and plan of care including tests being ordered have been discussed with the patient who indicate understanding and agree with the plan and Code Status.  Code Status:  FULL CODE per patient  Admission status: Inpatient: Based on patients clinical presentation and evaluation of above clinical data, I have made determination that patient meets Inpatient criteria at this time. Pt will require iv dexamethasone as well as iv abx for covid-19 infection, cap.  Pt has high risk of clinical deterioration.  Pt will need close follow up of multiple lab abnormalities, and will require > 2 nites stay  Time spent in minutes : 70 minutes   Jani Gravel M.D on 10/31/2019 at 12:39 AM

## 2019-10-31 NOTE — Progress Notes (Signed)
-  Patient is a 53 year old African-American male with past medical history significant for hypertension, hyperlipidemia and diabetes mellitus type 2.  Patient reported positive Covid status about 3 days ago.  Patient was admitted with worsening symptoms.  -Plan is to transfer patient to [facility] for further management of Covid.

## 2019-11-01 DIAGNOSIS — E785 Hyperlipidemia, unspecified: Secondary | ICD-10-CM

## 2019-11-01 DIAGNOSIS — J1289 Other viral pneumonia: Secondary | ICD-10-CM

## 2019-11-01 DIAGNOSIS — U071 COVID-19: Principal | ICD-10-CM

## 2019-11-01 DIAGNOSIS — R945 Abnormal results of liver function studies: Secondary | ICD-10-CM

## 2019-11-01 LAB — CBC WITH DIFFERENTIAL/PLATELET
Abs Immature Granulocytes: 0.05 10*3/uL (ref 0.00–0.07)
Basophils Absolute: 0 10*3/uL (ref 0.0–0.1)
Basophils Relative: 0 %
Eosinophils Absolute: 0 10*3/uL (ref 0.0–0.5)
Eosinophils Relative: 0 %
HCT: 36 % — ABNORMAL LOW (ref 39.0–52.0)
Hemoglobin: 12.9 g/dL — ABNORMAL LOW (ref 13.0–17.0)
Immature Granulocytes: 1 %
Lymphocytes Relative: 22 %
Lymphs Abs: 1.3 10*3/uL (ref 0.7–4.0)
MCH: 30.1 pg (ref 26.0–34.0)
MCHC: 35.8 g/dL (ref 30.0–36.0)
MCV: 83.9 fL (ref 80.0–100.0)
Monocytes Absolute: 0.5 10*3/uL (ref 0.1–1.0)
Monocytes Relative: 9 %
Neutro Abs: 4 10*3/uL (ref 1.7–7.7)
Neutrophils Relative %: 68 %
Platelets: 197 10*3/uL (ref 150–400)
RBC: 4.29 MIL/uL (ref 4.22–5.81)
RDW: 13.3 % (ref 11.5–15.5)
WBC: 5.9 10*3/uL (ref 4.0–10.5)
nRBC: 0 % (ref 0.0–0.2)

## 2019-11-01 LAB — COMPREHENSIVE METABOLIC PANEL
ALT: 28 U/L (ref 0–44)
AST: 45 U/L — ABNORMAL HIGH (ref 15–41)
Albumin: 3.1 g/dL — ABNORMAL LOW (ref 3.5–5.0)
Alkaline Phosphatase: 60 U/L (ref 38–126)
Anion gap: 12 (ref 5–15)
BUN: 18 mg/dL (ref 6–20)
CO2: 24 mmol/L (ref 22–32)
Calcium: 8.9 mg/dL (ref 8.9–10.3)
Chloride: 101 mmol/L (ref 98–111)
Creatinine, Ser: 0.96 mg/dL (ref 0.61–1.24)
GFR calc Af Amer: >60 mL/min (ref >60)
GFR calc non Af Amer: >60 mL/min (ref >60)
Glucose, Bld: 251 mg/dL — ABNORMAL HIGH (ref 70–99)
Potassium: 3.3 mmol/L — ABNORMAL LOW (ref 3.5–5.1)
Sodium: 137 mmol/L (ref 135–145)
Total Bilirubin: 0.9 mg/dL (ref 0.3–1.2)
Total Protein: 7.2 g/dL (ref 6.5–8.1)

## 2019-11-01 LAB — GLUCOSE, CAPILLARY
Glucose-Capillary: 216 mg/dL — ABNORMAL HIGH (ref 70–99)
Glucose-Capillary: 242 mg/dL — ABNORMAL HIGH (ref 70–99)
Glucose-Capillary: 329 mg/dL — ABNORMAL HIGH (ref 70–99)

## 2019-11-01 LAB — HEMOGLOBIN A1C
Hgb A1c MFr Bld: 9.7 % — ABNORMAL HIGH (ref 4.8–5.6)
Mean Plasma Glucose: 231.69 mg/dL

## 2019-11-01 LAB — LEGIONELLA PNEUMOPHILA SEROGP 1 UR AG: L. pneumophila Serogp 1 Ur Ag: NEGATIVE

## 2019-11-01 LAB — C-REACTIVE PROTEIN: CRP: 8 mg/dL — ABNORMAL HIGH (ref <1.0)

## 2019-11-01 LAB — FERRITIN: Ferritin: 553 ng/mL — ABNORMAL HIGH (ref 24–336)

## 2019-11-01 LAB — PROCALCITONIN: Procalcitonin: <0.1 ng/mL

## 2019-11-01 LAB — D-DIMER, QUANTITATIVE: D-Dimer, Quant: 0.33 ug/mL-FEU (ref 0.00–0.50)

## 2019-11-01 MED ORDER — INSULIN ASPART 100 UNIT/ML ~~LOC~~ SOLN
4.0000 [IU] | Freq: Three times a day (TID) | SUBCUTANEOUS | Status: DC
  Administered 2019-11-01 – 2019-11-04 (×11): 4 [IU] via SUBCUTANEOUS

## 2019-11-01 MED ORDER — INSULIN ASPART 100 UNIT/ML ~~LOC~~ SOLN
0.0000 [IU] | Freq: Three times a day (TID) | SUBCUTANEOUS | Status: DC
  Administered 2019-11-01: 11 [IU] via SUBCUTANEOUS
  Administered 2019-11-01 – 2019-11-02 (×3): 7 [IU] via SUBCUTANEOUS
  Administered 2019-11-02: 11 [IU] via SUBCUTANEOUS
  Administered 2019-11-02: 4 [IU] via SUBCUTANEOUS
  Administered 2019-11-03: 7 [IU] via SUBCUTANEOUS
  Administered 2019-11-03: 11 [IU] via SUBCUTANEOUS
  Administered 2019-11-03: 4 [IU] via SUBCUTANEOUS
  Administered 2019-11-04: 7 [IU] via SUBCUTANEOUS
  Administered 2019-11-04: 11 [IU] via SUBCUTANEOUS

## 2019-11-01 MED ORDER — SODIUM CHLORIDE 0.9 % IV SOLN: 100.0000 mg | Freq: Every day | INTRAVENOUS | Status: DC

## 2019-11-01 MED ORDER — INSULIN GLARGINE 100 UNIT/ML ~~LOC~~ SOLN
15.0000 [IU] | Freq: Every day | SUBCUTANEOUS | Status: DC
  Administered 2019-11-01 – 2019-11-04 (×4): 15 [IU] via SUBCUTANEOUS
  Filled 2019-11-01 (×4): qty 0.15

## 2019-11-01 NOTE — Progress Notes (Signed)
PROGRESS NOTE                                                                                                                                                                                                             Patient Demographics:    Dylan Abbott, is a 53 y.o. male, DOB - [birth year], WUJ:[id number]  Outpatient Primary MD for the patient is Shade Flood, MD   Admit date - 10/30/2019   LOS - 1  Chief Complaint  Patient presents with  . Shortness of Breath  . covid +       Brief Narrative: Patient is a 53 y.o. male with PMHx of DM-2, dyslipidemia, HTN-diagnosed with Covid 19 on 11/9-presented to the ED with, cough, fever and shortness of breath-upon further evaluation he was found to have pneumonia on chest x-ray-he was also found to have acute hypoxic respiratory failure requiring O2 supplementation.  He was subsequently transferred to the hospitalist service at [facility] for further management.  See below for further details.     Subjective:    [name] today feels better-he was titrated off oxygen this morning.  He still is coughing.  He is not yet back to his usual baseline.   Assessment  & Plan :   Acute Hypoxic Resp Failure due to Covid 19 Viral pneumonia: Remains stable-minimal to no oxygen requirements-continue steroids and remdesivir.  Fever: afebrile  O2 requirements: On RA this morning.  COVID-19 Labs: Recent Labs    10/30/19 2237 10/30/19 2246 11/01/19 0108  DDIMER 0.53*  --  0.33  FERRITIN  --  469* 553*  LDH 252*  --   --   CRP  --  8.1* 8.0*    Lab Results  Component Value Date   SARSCOV2NAA POSITIVE (A) 10/28/2019   SARSCOV2NAA Detected (A) 10/28/2019     COVID-19 Medications: Steroids: 11/11>> Remdesivir: 11/11>> Actemra: Not indicated given mild hypoxemia. Convalescent Plasma: Not given  Research Studies:N/A  Other medications:  Diuretics:Euvolemic-no need for lasix Antibiotics: Stop Rocephin/Zithromax on 11/13-no evidence of bacterial infection  Prone/Incentive Spirometry: encouraged  incentive spirometry use 3-4/hour.  DVT Prophylaxis  :  Lovenox   DM-2 (A1c pending): CBGs uncontrolled-start Lantus 15 units daily, NovoLog 4 units with meals-change SSI to resistant scale.  Follow and adjust.  A1c pending.  CBG (last 3)  Recent Labs    10/31/19 1418 10/31/19 1655 10/31/19 2041  GLUCAP 287* 298* 347*    HTN: Controlled-continue nifedipine  Dyslipidemia: Continue statin  Hypokalemia: Replete and recheck  Transaminitis: Mild- Likely secondary to COVID-19.  Follow.  Obesity: Estimated body mass index is 31.1 kg/m as calculated from the following:   Height as of this encounter: 6' (1.829 m).   Weight as of this encounter: 104 kg.   ABG: No results found for: PHART, PCO2ART, PO2ART, HCO3, TCO2, ACIDBASEDEF, O2SAT  Vent Settings: N/A    Condition - Stable  Family Communication  : None at bedside  Code Status :  Full Code  Diet :  Diet Order            Diet Heart Room service appropriate? Yes; Fluid consistency: Thin  Diet effective now               Disposition Plan  :  Remain hospitalized  Barriers to discharge: Complete 5 days of IV Remdesivir  Consults  :  None  Procedures  :  None  Antibiotics  :    Anti-infectives (From admission, onward)   Start     Dose/Rate Route Frequency Ordered Stop   10/31/19 1600  remdesivir 100 mg in sodium chloride 0.9 % 250 mL IVPB     100 mg 500 mL/hr over 30 Minutes Intravenous Every 24 hours 10/31/19 0034 11/04/19 1559   10/31/19 0100  remdesivir 200 mg in sodium chloride 0.9 % 250 mL IVPB     200 mg 500 mL/hr over 30 Minutes Intravenous Once 10/31/19 0031 10/31/19 0555   10/31/19 0030  cefTRIAXone (ROCEPHIN) 1 g in sodium chloride 0.9 % 100 mL IVPB     1 g 200 mL/hr over 30 Minutes Intravenous Daily at bedtime 10/31/19 0018     10/31/19  0030  azithromycin (ZITHROMAX) 500 mg in sodium chloride 0.9 % 250 mL IVPB     500 mg 250 mL/hr over 60 Minutes Intravenous Every 24 hours 10/31/19 0018        Inpatient Medications  Scheduled Meds: . dexamethasone (DECADRON) injection  6 mg Intravenous Daily  . enoxaparin (LOVENOX) injection  40 mg Subcutaneous Daily  . glipiZIDE  10 mg Oral Q breakfast  . insulin aspart  0-5 Units Subcutaneous QHS  . insulin aspart  0-9 Units Subcutaneous TID WC  . NIFEdipine  90 mg Oral Daily  . pioglitazone  15 mg Oral Daily  . simvastatin  40 mg Oral q1800  . vitamin C  500 mg Oral Daily  . zinc sulfate  220 mg Oral Daily   Continuous Infusions: . azithromycin 500 mg (10/31/19 2331)  . cefTRIAXone (ROCEPHIN)  IV 1 g (10/31/19 2152)  . remdesivir 100 mg in NS 250 mL 100 mg (10/31/19 1650)   PRN Meds:.cyclobenzaprine   Time Spent in minutes  25  See all Orders from today for further details   Oren Binet M.D on 11/01/2019 at 7:32 AM  To page go to [email or web address] - use universal password  [facility] -  Office  [phone]    Objective:   Vitals:   10/31/19 1700 10/31/19 1800 10/31/19 2018 11/01/19 0355  BP: 104/71 133/84 129/87 129/85  Pulse: 80 77 76 71  Resp: 14 12 18    Temp: 98.9 F (37.2 C) 98.8 F (37.1 C) 98.4 F (36.9 C) 98.3 F (36.8 C)  TempSrc: Oral Oral Oral Oral  SpO2: 95% 97% 97% 99%  Weight:  104 kg  Height:  6' (1.829 m)      Wt Readings from Last 3 Encounters:  10/31/19 104 kg  10/28/19 103.3 kg  08/09/19 109.6 kg     Intake/Output Summary (Last 24 hours) at 11/01/2019 0732 Last data filed at 11/01/2019 0645 Gross per 24 hour  Intake 840.04 ml  Output 3725 ml  Net -2884.96 ml     Physical Exam Gen Exam:Alert awake-not in any distress HEENT:atraumatic, normocephalic Chest: B/L clear to auscultation anteriorly CVS:S1S2 regular Abdomen:soft non tender, non distended Extremities:no edema Neurology: Non focal Skin: no rash    Data Review:    CBC Recent Labs  Lab 10/28/19 1825 10/30/19 2237 11/01/19 0108  WBC 4.6 5.8 5.9  HGB 13.4 12.5* 12.9*  HCT 37.5* 35.1* 36.0*  PLT 123* 149* 197  MCV 85.6 84.6 83.9  MCH 30.6 30.1 30.1  MCHC 35.7 35.6 35.8  RDW 13.2 13.3 13.3  LYMPHSABS 1.5 1.2 1.3  MONOABS 0.4 0.3 0.5  EOSABS 0.0 0.0 0.0  BASOSABS 0.0 0.0 0.0    Chemistries  Recent Labs  Lab 10/28/19 1825 10/30/19 2237 11/01/19 0108  NA 133* 132* 137  K 3.0* 2.9* 3.3*  CL 95* 96* 101  CO2 26 23 24   GLUCOSE 228* 212* 251*  BUN 15 15 18   CREATININE 1.43* 1.40* 0.96  CALCIUM 8.8* 8.5* 8.9  AST 55* 57* 45*  ALT 31 30 28   ALKPHOS 58 61 60  BILITOT 0.7 1.1 0.9   ------------------------------------------------------------------------------------------------------------------ Recent Labs    10/30/19 2246  TRIG 99    Lab Results  Component Value Date   HGBA1C 8.1 (H) 07/25/2019   ------------------------------------------------------------------------------------------------------------------ No results for input(s): TSH, T4TOTAL, T3FREE, THYROIDAB in the last 72 hours.  Invalid input(s): FREET3 ------------------------------------------------------------------------------------------------------------------ Recent Labs    10/30/19 2246 11/01/19 0108  FERRITIN 469* 553*    Coagulation profile Recent Labs  Lab 10/28/19 1825  INR 1.0    Recent Labs    10/30/19 2237 11/01/19 0108  DDIMER 0.53* 0.33    Cardiac Enzymes No results for input(s): CKMB, TROPONINI, MYOGLOBIN in the last 168 hours.  Invalid input(s): CK ------------------------------------------------------------------------------------------------------------------ No results found for: BNP  Micro Results Recent Results (from the past 240 hour(s))  Novel Coronavirus, NAA ([facility])     Status: Abnormal   Collection Time: 10/28/19 12:00 AM   Specimen: Nasopharyngeal(NP) swabs in vial transport medium    NASOPHARYNGE  TESTING  Result Value Ref Abbott Status   SARS-CoV-2, NAA Detected (A) Not Detected Final    Comment: This nucleic acid amplification test was developed and its performance characteristics determined by 2238. Nucleic acid amplification tests include PCR and TMA. This test has not been FDA cleared or approved. This test has been authorized by FDA under an Emergency Use Authorization (EUA). This test is only authorized for the duration of time the declaration that circumstances exist justifying the authorization of the emergency use of in vitro diagnostic tests for detection of SARS-CoV-2 virus and/or diagnosis of COVID-19 infection under section 564(b)(1) of the Act, 21 U.S.C. 11/03/19) (1), unless the authorization is terminated or revoked sooner. When diagnostic testing is negative, the possibility of a false negative result should be considered in the context of a patient's recent exposures and the presence of clinical signs and symptoms consistent with COVID-19. An individual without symptoms of COVID-19 and who is not shedding SARS-CoV-2 virus would  expect to have a negative (not detected) result in this assay.   SARS CORONAVIRUS 2 (TAT 6-24 HRS)  Nasopharyngeal Nasopharyngeal Swab     Status: Abnormal   Collection Time: 10/28/19  6:25 PM   Specimen: Nasopharyngeal Swab  Result Value Ref Abbott Status   SARS Coronavirus 2 POSITIVE (A) NEGATIVE Final    Comment: EMAILED LORI BERDIK @0138  ON 11.10.2020 BY TCALDWELL MT. (NOTE) SARS-CoV-2 target nucleic acids are DETECTED. The SARS-CoV-2 RNA is generally detectable in upper and lower respiratory specimens during the acute phase of infection. Positive results are indicative of active infection with SARS-CoV-2. Clinical  correlation with patient history and other diagnostic information is necessary to determine patient infection status. Positive results do  not rule out bacterial infection or  co-infection with other viruses. The expected result is Negative. Fact Sheet for Patients: HairSlick.nohttps://www.fda.gov/media/138098/download Fact Sheet for Healthcare Providers: quierodirigir.comhttps://www.fda.gov/media/138095/download This test is not yet approved or cleared by the [place] FDA and  has been authorized for detection and/or diagnosis of SARS-CoV-2 by FDA under an Emergency Use Authorization (EUA). This EUA will remain  in effect (meaning this test can be used) for the duration of the COVID-19 d eclaration under Section 564(b)(1) of the Act, 21 U.S.C. section 360bbb-3(b)(1), unless the authorization is terminated or revoked sooner. Performed at [facility], [address] [place], [place] [address]     Radiology Reports Dg Chest 2 View  Result Date: 10/28/2019 CLINICAL DATA:  Cough fever EXAM: CHEST - 2 VIEW COMPARISON:  Report 06/14/2002 FINDINGS: Mild cardiomegaly. Possible patchy peripheral airspace opacity at the right CP angle. No pneumothorax. IMPRESSION: 1. Possible patchy ground-glass opacity at the peripheral right base as may be seen with pneumonia, including atypical or viral etiology. 2. Mild cardiomegaly Electronically Signed   By: Jasmine PangKim  Fujinaga M.D.   On: 10/28/2019 15:58   Ct Angio Chest Pe W Or Wo Contrast  Result Date: 10/31/2019 CLINICAL DATA:  Covid positive, dyspnea EXAM: CT ANGIOGRAPHY CHEST WITH CONTRAST TECHNIQUE: Multidetector CT imaging of the chest was performed using the standard protocol during bolus administration of intravenous contrast. Multiplanar CT image reconstructions and MIPs were obtained to evaluate the vascular anatomy. CONTRAST:  100mL OMNIPAQUE IOHEXOL 350 MG/ML SOLN COMPARISON:  None. FINDINGS: Cardiovascular: There is a optimal opacification of the pulmonary arteries. There is no central,segmental, or subsegmental filling defects within the pulmonary arteries. There is mild cardiomegaly. No pericardial effusion. No evidence of right  ventricular heart strain. There is normal three-vessel brachiocephalic anatomy without proximal stenosis. The thoracic aorta is normal in appearance. Mediastinum/Nodes: Scattered small pretracheal, prevascular and right hilar lymph nodes are seen. The largest within the right hilar region measuring 1.3 cm. Thyroid gland, trachea, and esophagus demonstrate no significant findings. Lungs/Pleura: There is multifocal peripherally based rounded patchy airspace opacities seen throughout both lungs, predominantly within the right lower lung. There are air bronchograms seen in the posterior right lower lung. No pleural effusion is seen. Upper Abdomen: No acute abnormalities present in the visualized portions of the upper abdomen. Musculoskeletal: No chest wall abnormality. No acute or significant osseous findings. Review of the MIP images confirms the above findings. IMPRESSION: No central, segmental, or subsegmental pulmonary embolism. Multifocal patchy rounded airspace opacity seen throughout both lungs, predominantly within the right lower lobe, which is likely consistent with multifocal atypical pneumonia. Mild cardiomegaly Reactive mediastinal and right hilar adenopathy. Electronically Signed   By: Jonna ClarkBindu  Avutu M.D.   On: 10/31/2019 02:39   Dg Chest Port 1 View  Result Date: 10/30/2019 CLINICAL DATA:  COVID-19 positivity with fever, initial encounter EXAM: PORTABLE CHEST 1 VIEW  COMPARISON:  10/28/2019 FINDINGS: Cardiac shadow is enlarged but stable. The lungs are well aerated bilaterally. Increasing bibasilar airspace opacity is noted consistent with the given clinical history. No sizable effusion is noted. No bony abnormality is seen. IMPRESSION: Increasing airspace opacity in the bases right greater than left consistent with the COVID-19 positivity. Electronically Signed   By: Alcide Clever M.D.   On: 10/30/2019 23:05   US Abdomen Limited Ruq  Result Date: 10/31/2019 CLINICAL DATA:  Abnormal liver function  tests, covid positive EXAM: ULTRASOUND ABDOMEN LIMITED RIGHT UPPER QUADRANT COMPARISON:  None. FINDINGS: Gallbladder: The gallbladder is contracted. No gallbladder stones. The gallbladder wall thickness is 2.5 mm. No sonographic Murphy sign noted by sonographer. Common bile duct: Diameter: 3 mm Liver: No focal lesion identified. Within normal limits in parenchymal echogenicity. Portal vein is patent on color Doppler imaging with normal direction of blood flow towards the liver. Other: None. IMPRESSION: Normal examination. Electronically Signed   By: Jonna Clark M.D.   On: 10/31/2019 02:40

## 2019-11-02 LAB — CBC WITH DIFFERENTIAL/PLATELET
Abs Immature Granulocytes: 0.08 10*3/uL — ABNORMAL HIGH (ref 0.00–0.07)
Basophils Absolute: 0 10*3/uL (ref 0.0–0.1)
Basophils Relative: 0 %
Eosinophils Absolute: 0 10*3/uL (ref 0.0–0.5)
Eosinophils Relative: 0 %
HCT: 36.6 % — ABNORMAL LOW (ref 39.0–52.0)
Hemoglobin: 13 g/dL (ref 13.0–17.0)
Immature Granulocytes: 1 %
Lymphocytes Relative: 19 %
Lymphs Abs: 1.4 10*3/uL (ref 0.7–4.0)
MCH: 29.8 pg (ref 26.0–34.0)
MCHC: 35.5 g/dL (ref 30.0–36.0)
MCV: 83.9 fL (ref 80.0–100.0)
Monocytes Absolute: 0.5 10*3/uL (ref 0.1–1.0)
Monocytes Relative: 6 %
Neutro Abs: 5.4 10*3/uL (ref 1.7–7.7)
Neutrophils Relative %: 74 %
Platelets: 228 10*3/uL (ref 150–400)
RBC: 4.36 MIL/uL (ref 4.22–5.81)
RDW: 13.2 % (ref 11.5–15.5)
WBC: 7.4 10*3/uL (ref 4.0–10.5)
nRBC: 0 % (ref 0.0–0.2)

## 2019-11-02 LAB — D-DIMER, QUANTITATIVE: D-Dimer, Quant: 0.35 ug/mL-FEU (ref 0.00–0.50)

## 2019-11-02 LAB — COMPREHENSIVE METABOLIC PANEL
ALT: 27 U/L (ref 0–44)
AST: 36 U/L (ref 15–41)
Albumin: 3.2 g/dL — ABNORMAL LOW (ref 3.5–5.0)
Alkaline Phosphatase: 58 U/L (ref 38–126)
Anion gap: 10 (ref 5–15)
BUN: 21 mg/dL — ABNORMAL HIGH (ref 6–20)
CO2: 25 mmol/L (ref 22–32)
Calcium: 9.1 mg/dL (ref 8.9–10.3)
Chloride: 101 mmol/L (ref 98–111)
Creatinine, Ser: 1.03 mg/dL (ref 0.61–1.24)
GFR calc Af Amer: >60 mL/min (ref >60)
GFR calc non Af Amer: >60 mL/min (ref >60)
Glucose, Bld: 255 mg/dL — ABNORMAL HIGH (ref 70–99)
Potassium: 3 mmol/L — ABNORMAL LOW (ref 3.5–5.1)
Sodium: 136 mmol/L (ref 135–145)
Total Bilirubin: 0.6 mg/dL (ref 0.3–1.2)
Total Protein: 7.1 g/dL (ref 6.5–8.1)

## 2019-11-02 LAB — GLUCOSE, CAPILLARY
Glucose-Capillary: 191 mg/dL — ABNORMAL HIGH (ref 70–99)
Glucose-Capillary: 291 mg/dL — ABNORMAL HIGH (ref 70–99)
Glucose-Capillary: 242 mg/dL — ABNORMAL HIGH (ref 70–99)
Glucose-Capillary: 230 mg/dL — ABNORMAL HIGH (ref 70–99)

## 2019-11-02 LAB — FERRITIN: Ferritin: 551 ng/mL — ABNORMAL HIGH (ref 24–336)

## 2019-11-02 LAB — C-REACTIVE PROTEIN: CRP: 4.7 mg/dL — ABNORMAL HIGH (ref <1.0)

## 2019-11-02 MED ORDER — POTASSIUM CHLORIDE CRYS ER 20 MEQ PO TBCR
40.0000 meq | EXTENDED_RELEASE_TABLET | Freq: Once | ORAL | Status: AC
  Administered 2019-11-02: 08:00:00 40 meq via ORAL
  Filled 2019-11-02: qty 2

## 2019-11-02 MED ORDER — SODIUM CHLORIDE 0.9 % IV SOLN
100.0000 mg | Freq: Every day | INTRAVENOUS | Status: AC
  Administered 2019-11-02 – 2019-11-04 (×3): 100 mg via INTRAVENOUS
  Filled 2019-11-02: qty 100
  Filled 2019-11-02: qty 20
  Filled 2019-11-02: qty 100

## 2019-11-02 MED ORDER — DEXAMETHASONE SODIUM PHOSPHATE 4 MG/ML IJ SOLN
4.0000 mg | Freq: Every day | INTRAMUSCULAR | Status: DC
  Administered 2019-11-03 – 2019-11-04 (×2): 4 mg via INTRAVENOUS
  Filled 2019-11-02 (×2): qty 1

## 2019-11-02 NOTE — Progress Notes (Signed)
Patient updating family on his condition.  No need to contact them.  Earleen Reaper RN

## 2019-11-02 NOTE — Progress Notes (Signed)
PROGRESS NOTE                                                                                                                                                                                                             Patient Demographics:    Dylan Abbott, is a 53 y.o. male, DOB - [birth year], JYN:[id number]  Outpatient Primary MD for the patient is Shade FloodGreene, Jeffrey R, MD   Admit date - 10/30/2019   LOS - 2  Chief Complaint  Patient presents with  . Shortness of Breath  . covid       Brief Narrative: Patient is a 53 y.o. male with PMHx of DM-2, dyslipidemia, HTN-diagnosed with Covid 19 on 11/9-presented to the ED with, cough, fever and shortness of breath-upon further evaluation he was found to have pneumonia on chest x-ray-he was also found to have acute hypoxic respiratory failure requiring O2 supplementation.  He was subsequently [facility] hospital[facility] for [facility].  See below for further details.     Subjective:   Continues to complain of some mild cough-says that he gets mildly short of breath when he ambulates.  Lying comfortably in bed with no other complaints.   Assessment  & Plan :   Acute Hypoxic Resp Failure due to Covid 19 Viral pneumonia: Stable on room air-no continue steroids and remdesivir.  Will start tapering Decadron.  Once patient completes day 5 of remdesivir-he should be stable for discharge.  Fever: afebrile  O2 requirements: Room air.  COVID-19 Labs: Recent Labs    10/30/19 2237 10/30/19 2246 11/01/19 0108 11/02/19 0149  DDIMER 0.53*  --  0.33 0.35  FERRITIN  --  469* 553* 551*  LDH 252*  --   --   --   CRP  --  8.1* 8.0* 4.7*    Lab Results  Component Value Date   SARSCOV2NAA POSITIVE (A) 10/28/2019   SARSCOV2NAA Detected (A) 10/28/2019     COVID-19 Medications: Steroids: 11/11>> Remdesivir: 11/11>> Actemra: Not indicated  given mild hypoxemia. Convalescent Plasma: Not given  Research Studies:N/A  Other medications: Diuretics:Euvolemic-no need for lasix Antibiotics: Stop Rocephin/Zithromax on 11/13-no evidence of bacterial infection  Prone/Incentive Spirometry: encouraged  incentive spirometry use 3-4/hour.  DVT Prophylaxis  :  Lovenox   DM-2 (A1c 9.7): CBGs better controlled-steroids being tapered down-for  now continue with Lantus 15 units daily, NovoLog 4 units with meals and resistant SSI.  Follow and adjust.    CBG (last 3)  Recent Labs    11/01/19 2014 11/02/19 0728 11/02/19 1141  GLUCAP 329* 191* 291*    HTN: Controlled-continue nifedipine  Dyslipidemia: Continue statin  Hypokalemia: Replete and recheck.  Transaminitis: Mild- Likely secondary to COVID-19.  LFTs have normalized.  Obesity: Estimated body mass index is 31.1 kg/m as calculated from the following:   Height as of this encounter: 6' (1.829 m).   Weight as of this encounter: 104 kg.   ABG: No results found for: PHART, PCO2ART, PO2ART, HCO3, TCO2, ACIDBASEDEF, O2SAT  Vent Settings: N/A    Condition - Stable  Family Communication  : None at bedside  Code Status :  Full Code  Diet :  Diet Order            Diet heart healthy/carb modified Room service appropriate? Yes; Fluid consistency: Thin  Diet effective now               Disposition Plan  :  Remain hospitalized  Barriers to discharge: Complete 5 days of IV Remdesivir  Consults  :  None  Procedures  :  None  Antibiotics  :    Anti-infectives (From admission, onward)   Start     Dose/Rate Route Frequency Ordered Stop   11/02/19 1600  remdesivir 100 mg in sodium chloride 0.9 % 250 mL IVPB  Status:  Discontinued     100 mg 500 mL/hr over 30 Minutes Intravenous Daily 11/01/19 1157 11/02/19 0253   11/02/19 0400  remdesivir 100 mg in sodium chloride 0.9 % 250 mL IVPB     100 mg 500 mL/hr over 30 Minutes Intravenous Daily 11/02/19 0253 11/05/19 0959    10/31/19 1600  remdesivir 100 mg in sodium chloride 0.9 % 250 mL IVPB  Status:  Discontinued     100 mg 500 mL/hr over 30 Minutes Intravenous Every 24 hours 10/31/19 0034 11/01/19 1157   10/31/19 0100  remdesivir 200 mg in sodium chloride 0.9 % 250 mL IVPB     200 mg 500 mL/hr over 30 Minutes Intravenous Once 10/31/19 0031 10/31/19 0555   10/31/19 0030  cefTRIAXone (ROCEPHIN) 1 g in sodium chloride 0.9 % 100 mL IVPB  Status:  Discontinued     1 g 200 mL/hr over 30 Minutes Intravenous Daily at bedtime 10/31/19 0018 11/01/19 0736   10/31/19 0030  azithromycin (ZITHROMAX) 500 mg in sodium chloride 0.9 % 250 mL IVPB  Status:  Discontinued     500 mg 250 mL/hr over 60 Minutes Intravenous Every 24 hours 10/31/19 0018 11/01/19 0736      Inpatient Medications  Scheduled Meds: . dexamethasone (DECADRON) injection  6 mg Intravenous Daily  . enoxaparin (LOVENOX) injection  40 mg Subcutaneous Daily  . glipiZIDE  10 mg Oral Q breakfast  . insulin aspart  0-20 Units Subcutaneous TID WC  . insulin aspart  4 Units Subcutaneous TID WC  . insulin glargine  15 Units Subcutaneous Daily  . NIFEdipine  90 mg Oral Daily  . simvastatin  40 mg Oral q1800  . vitamin C  500 mg Oral Daily  . zinc sulfate  220 mg Oral Daily   Continuous Infusions: . remdesivir 100 mg in NS 250 mL 100 mg (11/02/19 0620)   PRN Meds:.cyclobenzaprine   Time Spent in minutes  25  See all Orders from today for further details  Jeoffrey Massed M.D on 11/02/2019 at 1:17 PM  To page go to [email or web address] - use universal password  [facility] -  Office  [phone]    Objective:   Vitals:   11/02/19 0500 11/02/19 0555 11/02/19 0600 11/02/19 0807  BP:  137/85  129/79  Pulse: 71 73 77 84  Resp:  18    Temp:  98.3 F (36.8 C)  98.7 F (37.1 C)  TempSrc:  Oral  Oral  SpO2: 92% 94% 93% 94%  Weight:      Height:        Wt Readings from Last 3 Encounters:  10/31/19 104 kg  10/28/19 103.3 kg  08/09/19 109.6  kg     Intake/Output Summary (Last 24 hours) at 11/02/2019 1317 Last data filed at 11/02/2019 0602 Gross per 24 hour  Intake 1520 ml  Output 2550 ml  Net -1030 ml     Physical Exam Gen Exam:Alert awake-not in any distress HEENT:atraumatic, normocephalic Chest: B/L clear to auscultation anteriorly CVS:S1S2 regular Abdomen:soft non tender, non distended Extremities:no edema Neurology: Non focal Skin: no rash   Data Review:    CBC Recent Labs  Lab 10/28/19 1825 10/30/19 2237 11/01/19 0108 11/02/19 0149  WBC 4.6 5.8 5.9 7.4  HGB 13.4 12.5* 12.9* 13.0  HCT 37.5* 35.1* 36.0* 36.6*  PLT 123* 149* 197 228  MCV 85.6 84.6 83.9 83.9  MCH 30.6 30.1 30.1 29.8  MCHC 35.7 35.6 35.8 35.5  RDW 13.2 13.3 13.3 13.2  LYMPHSABS 1.5 1.2 1.3 1.4  MONOABS 0.4 0.3 0.5 0.5  EOSABS 0.0 0.0 0.0 0.0  BASOSABS 0.0 0.0 0.0 0.0    Chemistries  Recent Labs  Lab 10/28/19 1825 10/30/19 2237 11/01/19 0108 11/02/19 0149  NA 133* 132* 137 136  K 3.0* 2.9* 3.3* 3.0*  CL 95* 96* 101 101  CO2 GLUCOSE 228* 212* 251* 255*  BUN 21*  CREATININE 1.43* 1.40* 0.96 1.03  CALCIUM 8.8* 8.5* 8.9 9.1  AST 55* 57* 45* 36  ALT ALKPHOS 58 61 60 58  BILITOT 0.7 1.1 0.9 0.6   ------------------------------------------------------------------------------------------------------------------ Recent Labs    10/30/19 2246  TRIG 99    Lab Results  Component Value Date   HGBA1C 9.7 (H) 11/01/2019   ------------------------------------------------------------------------------------------------------------------ No results for input(s): TSH, T4TOTAL, T3FREE, THYROIDAB in the last 72 hours.  Invalid input(s): FREET3 ------------------------------------------------------------------------------------------------------------------ Recent Labs    11/01/19 0108 11/02/19 0149  FERRITIN 553* 551*    Coagulation profile Recent Labs  Lab 10/28/19 1825  INR 1.0     Recent Labs    11/01/19 0108 11/02/19 0149  DDIMER 0.33 0.35    Cardiac Enzymes No results for input(s): CKMB, TROPONINI, MYOGLOBIN in the last 168 hours.  Invalid input(s): CK ------------------------------------------------------------------------------------------------------------------ No results found for: BNP  Micro Results Recent Results (from the past 240 hour(s))  Novel Coronavirus, NAA ([facility])     Status: Abnormal   Collection Time: 10/28/19 12:00 AM   Specimen: Nasopharyngeal(NP) swabs in vial transport medium   NASOPHARYNGE  TESTING  Result Value Ref Range Status   SARS-CoV-2, NAA Detected (A) Not Detected Final    Comment: This nucleic acid amplification test was developed and its performance characteristics determined by [employer]. Nucleic acid amplification tests include PCR and TMA. This test has not been FDA cleared or approved. This test has been authorized by FDA under an Emergency Use Authorization (EUA). This test is only authorized  for the duration of time the declaration that circumstances exist justifying the authorization of the emergency use of in vitro diagnostic tests for detection of SARS-CoV-2 virus and/or diagnosis of COVID-19 infection under section 564(b)(1) of the Act, 21 U.S.C. 998PJA-2(N) (1), unless the authorization is terminated or revoked sooner. When diagnostic testing is negative, the possibility of a false negative result should be considered in the context of a patient's recent exposures and the presence of clinical signs and symptoms consistent with COVID-19. An individual without symptoms of COVID-19 and who is not shedding SARS-CoV-2 virus would  expect to have a negative (not detected) result in this assay.   SARS CORONAVIRUS 2 (TAT 6-24 HRS) Nasopharyngeal Nasopharyngeal Swab     Status: Abnormal   Collection Time: 10/28/19  6:25 PM   Specimen: Nasopharyngeal Swab  Result Value Ref Range Status   SARS  Coronavirus 2 POSITIVE (A) NEGATIVE Final    Comment: EMAILED LORI BERDIK @0138  ON 11.10.2020 BY TCALDWELL MT. (NOTE) SARS-CoV-2 target nucleic acids are DETECTED. The SARS-CoV-2 RNA is generally detectable in upper and lower respiratory specimens during the acute phase of infection. Positive results are indicative of active infection with SARS-CoV-2. Clinical  correlation with patient history and other diagnostic information is necessary to determine patient infection status. Positive results do  not rule out bacterial infection or co-infection with other viruses. The expected result is Negative. Fact Sheet for Patients: 13.10.2020 Fact Sheet for Healthcare Providers: HairSlick.no This test is not yet approved or cleared by the quierodirigir.com FDA and  has been authorized for detection and/or diagnosis of SARS-CoV-2 by FDA under an Emergency Use Authorization (EUA). This EUA will remain  in effect (meaning this test can be used) for the duration of the COVID-19 d eclaration under Section 564(b)(1) of the Act, 21 U.S.C. section 360bbb-3(b)(1), unless the authorization is terminated or revoked sooner. Performed at [facility], [address], [place], [place]   Blood Culture (routine x 2)     Status: None (Preliminary result)   Collection Time: 10/30/19 10:37 PM   Specimen: BLOOD  Result Value Ref Range Status   Specimen Description   Final    BLOOD RIGHT ANTECUBITAL Performed at [facility], [address], [place], [place]    Special Requests   Final    BOTTLES DRAWN AEROBIC AND ANAEROBIC Blood Culture results may not be optimal due to an excessive volume of blood received in culture bottles Performed at [facility], [address] [place], [place]    Culture   Final    NO GROWTH 1 DAY Performed at [facility], [address],  [place], [place]    Report Status PENDING  Incomplete  Blood Culture (routine x 2)     Status: None (Preliminary result)   Collection Time: 10/30/19 11:32 PM   Specimen: BLOOD LEFT HAND  Result Value Ref Range Status   Specimen Description   Final    BLOOD LEFT HAND Performed at [facility], [address] [place], [place]    Special Requests   Final    BOTTLES DRAWN AEROBIC AND ANAEROBIC Blood Culture results may not be optimal due to an inadequate volume of blood received in culture bottles Performed at [facility], [address] [place], [place]    Culture   Final    NO GROWTH 1 DAY Performed at [facility], [address], [place],  [address]    Report Status PENDING  Incomplete    Radiology Reports Dg Chest 2 View  Result Date: 10/28/2019 CLINICAL DATA:  Cough fever EXAM: CHEST - 2 VIEW COMPARISON:  Report 06/14/2002 FINDINGS: Mild cardiomegaly. Possible patchy peripheral airspace opacity at the right CP angle. No pneumothorax. IMPRESSION: 1. Possible patchy ground-glass opacity at the peripheral right base as may be seen with pneumonia, including atypical or viral etiology. 2. Mild cardiomegaly Electronically Signed   By: Donavan Foil M.D.   On: 10/28/2019 15:58   Ct Angio Chest Pe W Or Wo Contrast  Result Date: 10/31/2019 CLINICAL DATA:  Covid positive, dyspnea EXAM: CT ANGIOGRAPHY CHEST WITH CONTRAST TECHNIQUE: Multidetector CT imaging of the chest was performed using the standard protocol during bolus administration of intravenous contrast. Multiplanar CT image reconstructions and MIPs were obtained to evaluate the vascular anatomy. CONTRAST:  138mL OMNIPAQUE IOHEXOL 350 MG/ML SOLN COMPARISON:  None. FINDINGS: Cardiovascular: There is a optimal opacification of the pulmonary arteries. There is no central,segmental, or subsegmental filling defects within the pulmonary arteries. There is mild  cardiomegaly. No pericardial effusion. No evidence of right ventricular heart strain. There is normal three-vessel brachiocephalic anatomy without proximal stenosis. The thoracic aorta is normal in appearance. Mediastinum/Nodes: Scattered small pretracheal, prevascular and right hilar lymph nodes are seen. The largest within the right hilar region measuring 1.3 cm. Thyroid gland, trachea, and esophagus demonstrate no significant findings. Lungs/Pleura: There is multifocal peripherally based rounded patchy airspace opacities seen throughout both lungs, predominantly within the right lower lung. There are air bronchograms seen in the posterior right lower lung. No pleural effusion is seen. Upper Abdomen: No acute abnormalities present in the visualized portions of the upper abdomen. Musculoskeletal: No chest wall abnormality. No acute or significant osseous findings. Review of the MIP images confirms the above findings. IMPRESSION: No central, segmental, or subsegmental pulmonary embolism. Multifocal patchy rounded airspace opacity seen throughout both lungs, predominantly within the right lower lobe, which is likely consistent with multifocal atypical pneumonia. Mild cardiomegaly Reactive mediastinal and right hilar adenopathy. Electronically Signed   By: Prudencio Pair M.D.   On: 10/31/2019 02:39   Dg Chest Port 1 View  Result Date: 10/30/2019 CLINICAL DATA:  COVID-19 positivity with fever, initial encounter EXAM: PORTABLE CHEST 1 VIEW COMPARISON:  10/28/2019 FINDINGS: Cardiac shadow is enlarged but stable. The lungs are well aerated bilaterally. Increasing bibasilar airspace opacity is noted consistent with the given clinical history. No sizable effusion is noted. No bony abnormality is seen. IMPRESSION: Increasing airspace opacity in the bases right greater than left consistent with the COVID-19 positivity. Electronically Signed   By: Inez Catalina M.D.   On: 10/30/2019 23:05   US Abdomen Limited Ruq  Result  Date: 10/31/2019 CLINICAL DATA:  Abnormal liver function tests, covid positive EXAM: ULTRASOUND ABDOMEN LIMITED RIGHT UPPER QUADRANT COMPARISON:  None. FINDINGS: Gallbladder: The gallbladder is contracted. No gallbladder stones. The gallbladder wall thickness is 2.5 mm. No sonographic Murphy sign noted by sonographer. Common bile duct: Diameter: 3 mm Liver: No focal lesion identified. Within normal limits in parenchymal echogenicity. Portal vein is patent on color Doppler imaging with normal direction of blood flow towards the liver. Other: None. IMPRESSION: Normal examination. Electronically Signed   By: Prudencio Pair M.D.   On: 10/31/2019 02:40

## 2019-11-02 NOTE — TOC Initial Note (Signed)
Transition of Care [facility]) - Initial/Assessment Note    Patient Details  Name: Dylan Abbott MRN: [id number] Date of Birth: 13-Nov-1966  [facility] CM/SW Contact:    Ninfa Meeker, RN Phone Number: 11/02/2019, 11:12 AM  Clinical Narrative:  Patient is a 53 y.o. male with PMHx of DM-2, dyslipidemia, HTN-diagnosed with Covid 19 on 11/9-presented to the ED with, cough, fever and shortness of breath-upon further evaluation he was found to have pneumonia on chest x-ray-he was also found to have acute hypoxic respiratory failure requiring O2 supplementation.  He was  transferred to [facility] for further management. On Room air today, Receiving Remdesivir and IV steroids. Case Manager will continue to monitor for needs. .                       Patient Goals and CMS Choice        Expected Discharge Plan and Services                                                Prior Living Arrangements/Services                       Activities of Daily Living Home Assistive Devices/Equipment: CBG Meter ADL Screening (condition at time of admission) Patient's cognitive ability adequate to safely complete daily activities?: Yes Is the patient deaf or have difficulty hearing?: No Does the patient have difficulty seeing, even when wearing glasses/contacts?: No Does the patient have difficulty concentrating, remembering, or making decisions?: No Patient able to express need for assistance with ADLs?: Yes Does the patient have difficulty dressing or bathing?: No Independently performs ADLs?: Yes (appropriate for developmental age) Does the patient have difficulty walking or climbing stairs?: Yes(secondary to weakness) Weakness of Legs: Both Weakness of Arms/Hands: None  Permission Sought/Granted                  Emotional Assessment              Admission diagnosis:  Abnormal liver function [R94.5] Pneumonia due to COVID-19 virus  [U07.1, J12.89] Patient Active Problem List   Diagnosis Date Noted  . COVID-19 virus infection 10/31/2019  . Abnormal liver function 10/31/2019  . Hypokalemia 10/31/2019  . Hyponatremia 10/31/2019  . Diabetes mellitus ([place]) 11/13/2017  . Hyperlipidemia 11/13/2017  . Hypertensive disorder 11/13/2017   PCP:  [name], MD Pharmacy:   [facility] [place], [address] [place] [address] Phone: [phone] Fax: [phone]     Social Determinants of Health (SDOH) Interventions    Readmission Risk Interventions No flowsheet data found.

## 2019-11-03 LAB — COMPREHENSIVE METABOLIC PANEL
ALT: 25 U/L (ref 0–44)
AST: 35 U/L (ref 15–41)
Albumin: 3.2 g/dL — ABNORMAL LOW (ref 3.5–5.0)
Alkaline Phosphatase: 58 U/L (ref 38–126)
Anion gap: 12 (ref 5–15)
BUN: 16 mg/dL (ref 6–20)
CO2: 25 mmol/L (ref 22–32)
Calcium: 9.1 mg/dL (ref 8.9–10.3)
Chloride: 102 mmol/L (ref 98–111)
Creatinine, Ser: 0.93 mg/dL (ref 0.61–1.24)
GFR calc Af Amer: >60 mL/min (ref >60)
GFR calc non Af Amer: >60 mL/min (ref >60)
Glucose, Bld: 197 mg/dL — ABNORMAL HIGH (ref 70–99)
Potassium: 3.1 mmol/L — ABNORMAL LOW (ref 3.5–5.1)
Sodium: 139 mmol/L (ref 135–145)
Total Bilirubin: 0.8 mg/dL (ref 0.3–1.2)
Total Protein: 7.1 g/dL (ref 6.5–8.1)

## 2019-11-03 LAB — CBC WITH DIFFERENTIAL/PLATELET
Abs Immature Granulocytes: 0.16 10*3/uL — ABNORMAL HIGH (ref 0.00–0.07)
Basophils Absolute: 0 10*3/uL (ref 0.0–0.1)
Basophils Relative: 1 %
Eosinophils Absolute: 0 10*3/uL (ref 0.0–0.5)
Eosinophils Relative: 0 %
HCT: 36.6 % — ABNORMAL LOW (ref 39.0–52.0)
Hemoglobin: 13 g/dL (ref 13.0–17.0)
Immature Granulocytes: 3 %
Lymphocytes Relative: 27 %
Lymphs Abs: 1.7 10*3/uL (ref 0.7–4.0)
MCH: 30 pg (ref 26.0–34.0)
MCHC: 35.5 g/dL (ref 30.0–36.0)
MCV: 84.3 fL (ref 80.0–100.0)
Monocytes Absolute: 0.6 10*3/uL (ref 0.1–1.0)
Monocytes Relative: 9 %
Neutro Abs: 3.8 10*3/uL (ref 1.7–7.7)
Neutrophils Relative %: 60 %
Platelets: 267 10*3/uL (ref 150–400)
RBC: 4.34 MIL/uL (ref 4.22–5.81)
RDW: 13.2 % (ref 11.5–15.5)
WBC: 6.4 10*3/uL (ref 4.0–10.5)
nRBC: 0 % (ref 0.0–0.2)

## 2019-11-03 LAB — C-REACTIVE PROTEIN: CRP: 4.5 mg/dL — ABNORMAL HIGH (ref <1.0)

## 2019-11-03 LAB — GLUCOSE, CAPILLARY
Glucose-Capillary: 191 mg/dL — ABNORMAL HIGH (ref 70–99)
Glucose-Capillary: 280 mg/dL — ABNORMAL HIGH (ref 70–99)
Glucose-Capillary: 247 mg/dL — ABNORMAL HIGH (ref 70–99)
Glucose-Capillary: 219 mg/dL — ABNORMAL HIGH (ref 70–99)

## 2019-11-03 LAB — D-DIMER, QUANTITATIVE: D-Dimer, Quant: 0.31 ug/mL-FEU (ref 0.00–0.50)

## 2019-11-03 LAB — FERRITIN: Ferritin: 569 ng/mL — ABNORMAL HIGH (ref 24–336)

## 2019-11-03 MED ORDER — POTASSIUM CHLORIDE CRYS ER 20 MEQ PO TBCR
40.0000 meq | EXTENDED_RELEASE_TABLET | Freq: Four times a day (QID) | ORAL | Status: AC
  Administered 2019-11-03 (×2): 40 meq via ORAL
  Filled 2019-11-03 (×2): qty 2

## 2019-11-03 NOTE — Progress Notes (Signed)
PROGRESS NOTE                                                                                                                                                                                                             Patient Demographics:    Dylan Abbott, is a 53 y.o. male, DOB - [birth year], UJW:[id number]  Outpatient Primary MD for the patient is Shade Flood, MD   Admit date - 10/30/2019   LOS - 3  Chief Complaint  Patient presents with  . Shortness of Breath  . covid       Brief Narrative: Patient is a 53 y.o. male with PMHx of DM-2, dyslipidemia, HTN-diagnosed with Covid 19 on 11/9-presented to the ED with, cough, fever and shortness of breath-upon further evaluation he was found to have pneumonia on chest x-ray-he was also found to have acute hypoxic respiratory failure requiring O2 supplementation.  He was subsequently transferred to the hospitalist service at [facility] for further management.  See below for further details.     Subjective:   Continues to  Improve- some cough-on room air.   Assessment  & Plan :   Acute Hypoxic Resp Failure due to Covid 19 Viral pneumonia: Remains stable on room air.Spoke with pharmacy-patient missed a day of remdesivir when he was transferred here, will finish up a 5 day course tomorrow.Continue tapering decadron.   Fever: afebrile  O2 requirements: Room air.  COVID-19 Labs: Recent Labs    11/01/19 0108 11/02/19 0149 11/03/19 0245  DDIMER 0.33 0.35 0.31  FERRITIN 553* 551* 569*  CRP 8.0* 4.7* 4.5*    Lab Results  Component Value Date   SARSCOV2NAA POSITIVE (A) 10/28/2019   SARSCOV2NAA Detected (A) 10/28/2019     COVID-19 Medications: Steroids: 11/11>> Remdesivir: 11/11>> Actemra: Not indicated . Convalescent Plasma: Not given  Research Studies:N/A  Other medications: Diuretics:Euvolemic-no need for lasix Antibiotics: StopPED  Rocephin/Zithromax on 11/13-no evidence of bacterial infection  Prone/Incentive Spirometry: encouraged  incentive spirometry use 3-4/hour.  DVT Prophylaxis  :  Lovenox   DM-2 (A1c 9.7): CBGs better controlled-steroids being tapered down-for now continue with Lantus 15 units daily, NovoLog 4 units with meals and resistant SSI.  Follow and adjust.    CBG (last 3)  Recent Labs    11/02/19 1659 11/02/19 2125 11/03/19 0748  GLUCAP 242* 230*  191*    HTN: Controlled-continue nifedipine  Dyslipidemia: Continue statin  Hypokalemia: Replete and recheck  Transaminitis: Mild- Likely secondary to COVID-19.  LFT's have resolved  Obesity: Estimated body mass index is 31.1 kg/m as calculated from the following:   Height as of this encounter: 6' (1.829 m).   Weight as of this encounter: 104 kg.   ABG: No results found for: PHART, PCO2ART, PO2ART, HCO3, TCO2, ACIDBASEDEF, O2SAT  Vent Settings: N/A    Condition - Stable  Family Communication  : None at bedside  Code Status :  Full Code  Diet :  Diet Order            Diet heart healthy/carb modified Room service appropriate? Yes; Fluid consistency: Thin  Diet effective now               Disposition Plan  :  Remain hospitalized  Barriers to discharge: Complete 5 days of IV Remdesivir  Consults  :  None  Procedures  :  None  Antibiotics  :    Anti-infectives (From admission, onward)   Start     Dose/Rate Route Frequency Ordered Stop   11/02/19 1600  remdesivir 100 mg in sodium chloride 0.9 % 250 mL IVPB  Status:  Discontinued     100 mg 500 mL/hr over 30 Minutes Intravenous Daily 11/01/19 1157 11/02/19 0253   11/02/19 0400  remdesivir 100 mg in sodium chloride 0.9 % 250 mL IVPB     100 mg 500 mL/hr over 30 Minutes Intravenous Daily 11/02/19 0253 11/05/19 0959   10/31/19 1600  remdesivir 100 mg in sodium chloride 0.9 % 250 mL IVPB  Status:  Discontinued     100 mg 500 mL/hr over 30 Minutes Intravenous Every 24 hours  10/31/19 0034 11/01/19 1157   10/31/19 0100  remdesivir 200 mg in sodium chloride 0.9 % 250 mL IVPB     200 mg 500 mL/hr over 30 Minutes Intravenous Once 10/31/19 0031 10/31/19 0555   10/31/19 0030  cefTRIAXone (ROCEPHIN) 1 g in sodium chloride 0.9 % 100 mL IVPB  Status:  Discontinued     1 g 200 mL/hr over 30 Minutes Intravenous Daily at bedtime 10/31/19 0018 11/01/19 0736   10/31/19 0030  azithromycin (ZITHROMAX) 500 mg in sodium chloride 0.9 % 250 mL IVPB  Status:  Discontinued     500 mg 250 mL/hr over 60 Minutes Intravenous Every 24 hours 10/31/19 0018 11/01/19 0736      Inpatient Medications  Scheduled Meds: . dexamethasone (DECADRON) injection  4 mg Intravenous Daily  . enoxaparin (LOVENOX) injection  40 mg Subcutaneous Daily  . glipiZIDE  10 mg Oral Q breakfast  . insulin aspart  0-20 Units Subcutaneous TID WC  . insulin aspart  4 Units Subcutaneous TID WC  . insulin glargine  15 Units Subcutaneous Daily  . NIFEdipine  90 mg Oral Daily  . simvastatin  40 mg Oral q1800  . vitamin C  500 mg Oral Daily  . zinc sulfate  220 mg Oral Daily   Continuous Infusions: . remdesivir 100 mg in NS 250 mL 100 mg (11/03/19 0843)   PRN Meds:.cyclobenzaprine   Time Spent in minutes  25  See all Orders from today for further details   Jeoffrey Massed M.D on 11/03/2019 at 10:09 AM  To page go to [email or web address] - use universal password  [facility] -  Office  [phone]    Objective:   Vitals:   11/02/19 1700 11/02/19 2005 11/03/19  7847 11/03/19 0917  BP: 137/85  137/90   Pulse: 85  88 82  Resp: 17 18 16 18   Temp: 98.4 F (36.9 C) 98.3 F (36.8 C) 98.7 F (37.1 C) 98.8 F (37.1 C)  TempSrc: Oral Oral Oral Oral  SpO2: 95%  98% 97%  Weight:      Height:        Wt Readings from Last 3 Encounters:  10/31/19 104 kg  10/28/19 103.3 kg  08/09/19 109.6 kg     Intake/Output Summary (Last 24 hours) at 11/03/2019 1009 Last data filed at 11/03/2019 0900 Gross per  24 hour  Intake 850 ml  Output 550 ml  Net 300 ml     Physical Exam Gen Exam:Alert awake-not in any distress HEENT:atraumatic, normocephalic Chest: B/L clear to auscultation anteriorly CVS:S1S2 regular Abdomen:soft non tender, non distended Extremities:no edema Neurology: Non focal Skin: no rash   Data Review:    CBC Recent Labs  Lab 10/28/19 1825 10/30/19 2237 11/01/19 0108 11/02/19 0149 11/03/19 0245  WBC 4.6 5.8 5.9 7.4 6.4  HGB 13.4 12.5* 12.9* 13.0 13.0  HCT 37.5* 35.1* 36.0* 36.6* 36.6*  PLT 123* 149* 197 228 267  MCV 85.6 84.6 83.9 83.9 84.3  MCH 30.6 30.1 30.1 29.8 30.0  MCHC 35.7 35.6 35.8 35.5 35.5  RDW 13.2 13.3 13.3 13.2 13.2  LYMPHSABS 1.5 1.2 1.3 1.4 1.7  MONOABS 0.4 0.3 0.5 0.5 0.6  EOSABS 0.0 0.0 0.0 0.0 0.0  BASOSABS 0.0 0.0 0.0 0.0 0.0    Chemistries  Recent Labs  Lab 10/28/19 1825 10/30/19 2237 11/01/19 0108 11/02/19 0149 11/03/19 0245  NA 133* 132* 137 136 139  K 3.0* 2.9* 3.3* 3.0* 3.1*  CL 95* 96* 101 101 102  CO2 26 23 24 25 25   GLUCOSE 228* 212* 251* 255* 197*  BUN 15 15 18  21* 16  CREATININE 1.43* 1.40* 0.96 1.03 0.93  CALCIUM 8.8* 8.5* 8.9 9.1 9.1  AST 55* 57* 45* 36 35  ALT 31 30 28 27 25   ALKPHOS 58 61 60 58 58  BILITOT 0.7 1.1 0.9 0.6 0.8   ------------------------------------------------------------------------------------------------------------------ No results for input(s): CHOL, HDL, LDLCALC, TRIG, CHOLHDL, LDLDIRECT in the last 72 hours.  Lab Results  Component Value Date   HGBA1C 9.7 (H) 11/01/2019   ------------------------------------------------------------------------------------------------------------------ No results for input(s): TSH, T4TOTAL, T3FREE, THYROIDAB in the last 72 hours.  Invalid input(s): FREET3 ------------------------------------------------------------------------------------------------------------------ Recent Labs    11/02/19 0149 11/03/19 0245  FERRITIN 551* 569*     Coagulation profile Recent Labs  Lab 10/28/19 1825  INR 1.0    Recent Labs    11/02/19 0149 11/03/19 0245  DDIMER 0.35 0.31    Cardiac Enzymes No results for input(s): CKMB, TROPONINI, MYOGLOBIN in the last 168 hours.  Invalid input(s): CK ------------------------------------------------------------------------------------------------------------------ No results found for: BNP  Micro Results Recent Results (from the past 240 hour(s))  Novel Coronavirus, NAA ([facility])     Status: Abnormal   Collection Time: 10/28/19 12:00 AM   Specimen: Nasopharyngeal(NP) swabs in vial transport medium   NASOPHARYNGE  TESTING  Result Value Ref Range Status   SARS-CoV-2, NAA Detected (A) Not Detected Final    Comment: This nucleic acid amplification test was developed and its performance characteristics determined by 13/09/20. Nucleic acid amplification tests include PCR and TMA. This test has not been FDA cleared or approved. This test has been authorized by FDA under an Emergency Use Authorization (EUA). This test is only authorized for the duration  of time the declaration that circumstances exist justifying the authorization of the emergency use of in vitro diagnostic tests for detection of SARS-CoV-2 virus and/or diagnosis of COVID-19 infection under section 564(b)(1) of the Act, 21 U.S.C. 098JXB-1(Y360bbb-3(b) (1), unless the authorization is terminated or revoked sooner. When diagnostic testing is negative, the possibility of a false negative result should be considered in the context of a patient's recent exposures and the presence of clinical signs and symptoms consistent with COVID-19. An individual without symptoms of COVID-19 and who is not shedding SARS-CoV-2 virus would  expect to have a negative (not detected) result in this assay.   SARS CORONAVIRUS 2 (TAT 6-24 HRS) Nasopharyngeal Nasopharyngeal Swab     Status: Abnormal   Collection Time: 10/28/19  6:25 PM    Specimen: Nasopharyngeal Swab  Result Value Ref Range Status   SARS Coronavirus 2 POSITIVE (A) NEGATIVE Final    Comment: EMAILED LORI BERDIK @0138  ON 11.10.2020 BY TCALDWELL MT. (NOTE) SARS-CoV-2 target nucleic acids are DETECTED. The SARS-CoV-2 RNA is generally detectable in upper and lower respiratory specimens during the acute phase of infection. Positive results are indicative of active infection with SARS-CoV-2. Clinical  correlation with patient history and other diagnostic information is necessary to determine patient infection status. Positive results do  not rule out bacterial infection or co-infection with other viruses. The expected result is Negative. Fact Sheet for Patients: HairSlick.nohttps://www.fda.gov/media/138098/download Fact Sheet for Healthcare Providers: quierodirigir.comhttps://www.fda.gov/media/138095/download This test is not yet approved or cleared by the [place] FDA and  has been authorized for detection and/or diagnosis of SARS-CoV-2 by FDA under an Emergency Use Authorization (EUA). This EUA will remain  in effect (meaning this test can be used) for the duration of the COVID-19 d eclaration under Section 564(b)(1) of the Act, 21 U.S.C. section 360bbb-3(b)(1), unless the authorization is terminated or revoked sooner. Performed at [facility], [address] [place], [place] [address]   Blood Culture (routine x 2)     Status: None (Preliminary result)   Collection Time: 10/30/19 10:37 PM   Specimen: BLOOD  Result Value Ref Range Status   Specimen Description   Final    BLOOD RIGHT ANTECUBITAL Performed at [facility], [address] [place], [place] [address]    Special Requests   Final    BOTTLES DRAWN AEROBIC AND ANAEROBIC Blood Culture results may not be optimal due to an excessive volume of blood received in culture bottles Performed at [facility], [address] [place], [place] [address]    Culture   Final    NO  GROWTH 2 DAYS Performed at [facility], [address] [place], [place] [address]    Report Status PENDING  Incomplete  Blood Culture (routine x 2)     Status: None (Preliminary result)   Collection Time: 10/30/19 11:32 PM   Specimen: BLOOD LEFT HAND  Result Value Ref Range Status   Specimen Description   Final    BLOOD LEFT HAND Performed at [facility], [address] [place], [place] [address]    Special Requests   Final    BOTTLES DRAWN AEROBIC AND ANAEROBIC Blood Culture results may not be optimal due to an inadequate volume of blood received in culture bottles Performed at [facility], [address] [place], [place] [address]    Culture   Final    NO GROWTH 2 DAYS Performed at [facility], [address] [place], [place] [address]    Report Status PENDING  Incomplete    Radiology Reports Dg Chest 2 View  Result Date: 10/28/2019 CLINICAL DATA:  Cough fever EXAM: CHEST - 2 VIEW COMPARISON:  Report 06/14/2002 FINDINGS: Mild cardiomegaly. Possible patchy peripheral airspace opacity at the right CP angle. No pneumothorax. IMPRESSION: 1. Possible patchy ground-glass opacity at the peripheral right base as may be seen with pneumonia, including atypical or viral etiology. 2. Mild cardiomegaly Electronically Signed   By: Donavan Foil M.D.   On: 10/28/2019 15:58   Ct Angio Chest Pe W Or Wo Contrast  Result Date: 10/31/2019 CLINICAL DATA:  Covid positive, dyspnea EXAM: CT ANGIOGRAPHY CHEST WITH CONTRAST TECHNIQUE: Multidetector CT imaging of the chest was performed using the standard protocol during bolus administration of intravenous contrast. Multiplanar CT image reconstructions and MIPs were obtained to evaluate the vascular anatomy. CONTRAST:  136mL OMNIPAQUE IOHEXOL 350 MG/ML SOLN COMPARISON:  None. FINDINGS: Cardiovascular: There is a optimal opacification of the pulmonary arteries. There is no central,segmental, or  subsegmental filling defects within the pulmonary arteries. There is mild cardiomegaly. No pericardial effusion. No evidence of right ventricular heart strain. There is normal three-vessel brachiocephalic anatomy without proximal stenosis. The thoracic aorta is normal in appearance. Mediastinum/Nodes: Scattered small pretracheal, prevascular and right hilar lymph nodes are seen. The largest within the right hilar region measuring 1.3 cm. Thyroid gland, trachea, and esophagus demonstrate no significant findings. Lungs/Pleura: There is multifocal peripherally based rounded patchy airspace opacities seen throughout both lungs, predominantly within the right lower lung. There are air bronchograms seen in the posterior right lower lung. No pleural effusion is seen. Upper Abdomen: No acute abnormalities present in the visualized portions of the upper abdomen. Musculoskeletal: No chest wall abnormality. No acute or significant osseous findings. Review of the MIP images confirms the above findings. IMPRESSION: No central, segmental, or subsegmental pulmonary embolism. Multifocal patchy rounded airspace opacity seen throughout both lungs, predominantly within the right lower lobe, which is likely consistent with multifocal atypical pneumonia. Mild cardiomegaly Reactive mediastinal and right hilar adenopathy. Electronically Signed   By: Prudencio Pair M.D.   On: 10/31/2019 02:39   Dg Chest Port 1 View  Result Date: 10/30/2019 CLINICAL DATA:  COVID-19 positivity with fever, initial encounter EXAM: PORTABLE CHEST 1 VIEW COMPARISON:  10/28/2019 FINDINGS: Cardiac shadow is enlarged but stable. The lungs are well aerated bilaterally. Increasing bibasilar airspace opacity is noted consistent with the given clinical history. No sizable effusion is noted. No bony abnormality is seen. IMPRESSION: Increasing airspace opacity in the bases right greater than left consistent with the COVID-19 positivity. Electronically Signed   By:  Inez Catalina M.D.   On: 10/30/2019 23:05   US Abdomen Limited Ruq  Result Date: 10/31/2019 CLINICAL DATA:  Abnormal liver function tests, covid positive EXAM: ULTRASOUND ABDOMEN LIMITED RIGHT UPPER QUADRANT COMPARISON:  None. FINDINGS: Gallbladder: The gallbladder is contracted. No gallbladder stones. The gallbladder wall thickness is 2.5 mm. No sonographic Murphy sign noted by sonographer. Common bile duct: Diameter: 3 mm Liver: No focal lesion identified. Within normal limits in parenchymal echogenicity. Portal vein is patent on color Doppler imaging with normal direction of blood flow towards the liver. Other: None. IMPRESSION: Normal examination. Electronically Signed   By: Prudencio Pair M.D.   On: 10/31/2019 02:40

## 2019-11-03 NOTE — Plan of Care (Signed)
  Problem: Clinical Measurements: Goal: Respiratory complications will improve Outcome: Progressing   Problem: Activity: Goal: Risk for activity intolerance will decrease Outcome: Progressing   

## 2019-11-04 DIAGNOSIS — E1165 Type 2 diabetes mellitus with hyperglycemia: Secondary | ICD-10-CM

## 2019-11-04 LAB — CBC WITH DIFFERENTIAL/PLATELET
Abs Immature Granulocytes: 0.34 10*3/uL — ABNORMAL HIGH (ref 0.00–0.07)
Basophils Absolute: 0.1 10*3/uL (ref 0.0–0.1)
Basophils Relative: 1 %
Eosinophils Absolute: 0.1 10*3/uL (ref 0.0–0.5)
Eosinophils Relative: 2 %
HCT: 39.3 % (ref 39.0–52.0)
Hemoglobin: 14 g/dL (ref 13.0–17.0)
Immature Granulocytes: 4 %
Lymphocytes Relative: 29 %
Lymphs Abs: 2.3 10*3/uL (ref 0.7–4.0)
MCH: 30.2 pg (ref 26.0–34.0)
MCHC: 35.6 g/dL (ref 30.0–36.0)
MCV: 84.7 fL (ref 80.0–100.0)
Monocytes Absolute: 0.7 10*3/uL (ref 0.1–1.0)
Monocytes Relative: 9 %
Neutro Abs: 4.6 10*3/uL (ref 1.7–7.7)
Neutrophils Relative %: 55 %
Platelets: 309 10*3/uL (ref 150–400)
RBC: 4.64 MIL/uL (ref 4.22–5.81)
RDW: 13.3 % (ref 11.5–15.5)
WBC: 8.1 10*3/uL (ref 4.0–10.5)
nRBC: 0.4 % — ABNORMAL HIGH (ref 0.0–0.2)

## 2019-11-04 LAB — COMPREHENSIVE METABOLIC PANEL
ALT: 30 U/L (ref 0–44)
AST: 40 U/L (ref 15–41)
Albumin: 3.1 g/dL — ABNORMAL LOW (ref 3.5–5.0)
Alkaline Phosphatase: 59 U/L (ref 38–126)
Anion gap: 11 (ref 5–15)
BUN: 17 mg/dL (ref 6–20)
CO2: 25 mmol/L (ref 22–32)
Calcium: 9 mg/dL (ref 8.9–10.3)
Chloride: 103 mmol/L (ref 98–111)
Creatinine, Ser: 0.89 mg/dL (ref 0.61–1.24)
GFR calc Af Amer: >60 mL/min (ref >60)
GFR calc non Af Amer: >60 mL/min (ref >60)
Glucose, Bld: 141 mg/dL — ABNORMAL HIGH (ref 70–99)
Potassium: 3.5 mmol/L (ref 3.5–5.1)
Sodium: 139 mmol/L (ref 135–145)
Total Bilirubin: 0.7 mg/dL (ref 0.3–1.2)
Total Protein: 7 g/dL (ref 6.5–8.1)

## 2019-11-04 LAB — GLUCOSE, CAPILLARY
Glucose-Capillary: 202 mg/dL — ABNORMAL HIGH (ref 70–99)
Glucose-Capillary: 279 mg/dL — ABNORMAL HIGH (ref 70–99)

## 2019-11-04 LAB — D-DIMER, QUANTITATIVE: D-Dimer, Quant: <0.27 ug/mL-FEU (ref 0.00–0.50)

## 2019-11-04 LAB — FERRITIN: Ferritin: 600 ng/mL — ABNORMAL HIGH (ref 24–336)

## 2019-11-04 LAB — MAGNESIUM: Magnesium: 1.6 mg/dL — ABNORMAL LOW (ref 1.7–2.4)

## 2019-11-04 LAB — C-REACTIVE PROTEIN: CRP: 2.1 mg/dL — ABNORMAL HIGH (ref <1.0)

## 2019-11-04 MED ORDER — DEXAMETHASONE 2 MG PO TABS: 2.0000 mg | ORAL_TABLET | Freq: Every day | ORAL | 0 refills | Status: AC

## 2019-11-04 NOTE — Discharge Instructions (Signed)
Person Under Monitoring Name: Dylan Abbott  Location: [address] [place] [address]   Infection Prevention Recommendations for Individuals Confirmed to have, or Being Evaluated for, 2019 Novel Coronavirus (COVID-19) Infection Who Receive Care at Home  Individuals who are confirmed to have, or are being evaluated for, COVID-19 should follow the prevention steps below until a healthcare provider or local or state health department says they can return to normal activities.  Stay home except to get medical care You should restrict activities outside your home, except for getting medical care. Do not go to work, school, or public areas, and do not use public transportation or taxis.  Call ahead before visiting your doctor Before your medical appointment, call the healthcare provider and tell them that you have, or are being evaluated for, COVID-19 infection. This will help the healthcare providers office take steps to keep other people from getting infected. Ask your healthcare provider to call the local or state health department.  Monitor your symptoms Seek prompt medical attention if your illness is worsening (e.g., difficulty breathing). Before going to your medical appointment, call the healthcare provider and tell them that you have, or are being evaluated for, COVID-19 infection. Ask your healthcare provider to call the local or state health department.  Wear a facemask You should wear a facemask that covers your nose and mouth when you are in the same room with other people and when you visit a healthcare provider. People who live with or visit you should also wear a facemask while they are in the same room with you.  Separate yourself from other people in your home As much as possible, you should stay in a different room from other people in your home. Also, you should use a separate bathroom, if available.  Avoid sharing household items You should not  share dishes, drinking glasses, cups, eating utensils, towels, bedding, or other items with other people in your home. After using these items, you should wash them thoroughly with soap and water.  Cover your coughs and sneezes Cover your mouth and nose with a tissue when you cough or sneeze, or you can cough or sneeze into your sleeve. Throw used tissues in a lined trash can, and immediately wash your hands with soap and water for at least 20 seconds or use an alcohol-based hand rub.  Wash your [facility] your hands often and thoroughly with soap and water for at least 20 seconds. You can use an alcohol-based hand sanitizer if soap and water are not available and if your hands are not visibly dirty. Avoid touching your eyes, nose, and mouth with unwashed hands.   Prevention Steps for Caregivers and Household Members of Individuals Confirmed to have, or Being Evaluated for, COVID-19 Infection Being Cared for in the Home  If you live with, or provide care at home for, a person confirmed to have, or being evaluated for, COVID-19 infection please follow these guidelines to prevent infection:  Follow healthcare providers instructions Make sure that you understand and can help the patient follow any healthcare provider instructions for all care.  Provide for the patients basic needs You should help the patient with basic needs in the home and provide support for getting groceries, prescriptions, and other personal needs.  Monitor the patients symptoms If they are getting sicker, call his or her medical provider and tell them that the patient has, or is being evaluated for, COVID-19 infection. This will help the healthcare providers office  take steps to keep other people from getting infected. Ask the healthcare provider to call the local or state health department.  Limit the number of people who have contact with the patient  If possible, have only one caregiver for the  patient.  Other household members should stay in another home or place of residence. If this is not possible, they should stay  in another room, or be separated from the patient as much as possible. Use a separate bathroom, if available.  Restrict visitors who do not have an essential need to be in the home.  Keep older adults, very young children, and other sick people away from the patient Keep older adults, very young children, and those who have compromised immune systems or chronic health conditions away from the patient. This includes people with chronic heart, lung, or kidney conditions, diabetes, and cancer.  Ensure good ventilation Make sure that shared spaces in the home have good air flow, such as from an air conditioner or an opened window, weather permitting.  Wash your hands often  Wash your hands often and thoroughly with soap and water for at least 20 seconds. You can use an alcohol based hand sanitizer if soap and water are not available and if your hands are not visibly dirty.  Avoid touching your eyes, nose, and mouth with unwashed hands.  Use disposable paper towels to dry your hands. If not available, use dedicated cloth towels and replace them when they become wet.  Wear a facemask and gloves  Wear a disposable facemask at all times in the room and gloves when you touch or have contact with the patients blood, body fluids, and/or secretions or excretions, such as sweat, saliva, sputum, nasal mucus, vomit, urine, or feces.  Ensure the mask fits over your nose and mouth tightly, and do not touch it during use.  Throw out disposable facemasks and gloves after using them. Do not reuse.  Wash your hands immediately after removing your facemask and gloves.  If your personal clothing becomes contaminated, carefully remove clothing and launder. Wash your hands after handling contaminated clothing.  Place all used disposable facemasks, gloves, and other waste in a lined  container before disposing them with other household waste.  Remove gloves and wash your hands immediately after handling these items.  Do not share dishes, glasses, or other household items with the patient  Avoid sharing household items. You should not share dishes, drinking glasses, cups, eating utensils, towels, bedding, or other items with a patient who is confirmed to have, or being evaluated for, COVID-19 infection.  After the person uses these items, you should wash them thoroughly with soap and water.  Wash laundry thoroughly  Immediately remove and wash clothes or bedding that have blood, body fluids, and/or secretions or excretions, such as sweat, saliva, sputum, nasal mucus, vomit, urine, or feces, on them.  Wear gloves when handling laundry from the patient.  Read and follow directions on labels of laundry or clothing items and detergent. In general, wash and dry with the warmest temperatures recommended on the label.  Clean all areas the individual has used often  Clean all touchable surfaces, such as counters, tabletops, doorknobs, bathroom fixtures, toilets, phones, keyboards, tablets, and bedside tables, every day. Also, clean any surfaces that may have blood, body fluids, and/or secretions or excretions on them.  Wear gloves when cleaning surfaces the patient has come in contact with.  Use a diluted bleach solution (e.g., dilute bleach with 1 part  bleach and 10 parts water) or a household disinfectant with a label that says EPA-registered for coronaviruses. To make a bleach solution at home, add 1 tablespoon of bleach to 1 quart (4 cups) of water. For a larger supply, add  cup of bleach to 1 gallon (16 cups) of water.  Read labels of cleaning products and follow recommendations provided on product labels. Labels contain instructions for safe and effective use of the cleaning product including precautions you should take when applying the product, such as wearing gloves or  eye protection and making sure you have good ventilation during use of the product.  Remove gloves and wash hands immediately after cleaning.  Monitor yourself for signs and symptoms of illness Caregivers and household members are considered close contacts, should monitor their health, and will be asked to limit movement outside of the home to the extent possible. Follow the monitoring steps for close contacts listed on the symptom monitoring form.   ? If you have additional questions, contact your local health department or call the epidemiologist on call at [phone] (available 24/7). ? This guidance is subject to change. For the most up-to-date guidance from [facility], please refer to their website: [email or web address]

## 2019-11-04 NOTE — Plan of Care (Signed)
  Problem: Respiratory: Goal: Complications related to the disease process, condition or treatment will be avoided or minimized Outcome: Progressing   

## 2019-11-04 NOTE — Discharge Summary (Addendum)
PATIENT DETAILS Name: Dylan Abbott Age: 53 y.o. Sex: male Date of Birth: 04/28/1966 MRN: [id number]. Admitting Physician: Jani Gravel, MD OYD:XAJOIN, Ranell Patrick, MD  Admit Date: 10/30/2019 Discharge date: 11/04/2019  Recommendations for Outpatient Follow-up:  1. Follow up with PCP in 1-2 weeks 2. Please obtain CMP/CBC in one week 3. Repeat Chest Xray in 4-6 week  Admitted From:  Home  Disposition: [place]: Yes  Equipment/Devices: None  Discharge Condition: Stable  CODE STATUS: FULL CODE  Diet recommendation:  Diet Order            Diet - low sodium heart healthy        Diet Carb Modified        Diet heart healthy/carb modified Room service appropriate? Yes; Fluid consistency: Thin  Diet effective now               Brief Summary: See H&P, Labs, Consult and Test reports for all details in brief,Patient is a 53 y.o. male with PMHx of DM-2, dyslipidemia, HTN-diagnosed with Covid 19 on 11/9-presented to the ED with, cough, fever and shortness of breath-upon further evaluation he was found to have pneumonia on chest x-ray-he was also found to have acute hypoxic respiratory failure requiring O2 supplementation.  He was subsequently transferred to the hospitalist service at [facility] for further management.  See below for further details.    Brief Hospital Course: Acute Hypoxic Resp Failure due to Covid 19 Viral pneumonia: Significantly improved-Remains stable on room air for the past few days. Treated with Remdesivir and steroids, will complete remdesivir today, will be discharged on tapering steroids for a few more days. Patient aware that he need to be isolated for 3 weeks from 11/9  COVID-19 Labs:  Recent Labs    11/02/19 0149 11/03/19 0245 11/04/19 0455  DDIMER 0.35 0.31 <0.27  FERRITIN 551* 569* 600*  CRP 4.7* 4.5* 2.1*    Lab Results  Component Value Date   SARSCOV2NAA POSITIVE (A) 10/28/2019   SARSCOV2NAA Detected (A)  10/28/2019     COVID-19 Medications: Steroids: 11/11>> Remdesivir: 11/11>>11/16 Actemra: Not indicated . Convalescent Plasma: Not given  Research Studies:N/A   DM-2 (A1c 9.7): CBGs better controlled-steroids being tapered down-managed with Lantus/SSI-will switch back to usual home regimen on discharge. Follow with PCP for further optimization  CBG (last 3)  Recent Labs    11/03/19 1711 11/03/19 2108 11/04/19 0804  GLUCAP 247* 219* 202*   HTN: Controlled-continue nifedipine  Dyslipidemia: Continue statin  Hypokalemia: Repleted and recheck with PCP at next follow up  Transaminitis: Mild- Likely secondary to COVID-19.  LFT's have resolved  Obesity: Estimated body mass index is 31.1 kg/m as calculated from the following:   Height as of this encounter: 6' (1.829 m).   Weight as of this encounter: 104 kg.   Obesity: Estimated body mass index is 31.1 kg/m as calculated from the following:   Height as of this encounter: 6' (1.829 m).   Weight as of this encounter: 104 kg.    Procedures/Studies: None  Discharge Diagnoses:  Principal Problem:   COVID-19 virus infection Active Problems:   Diabetes mellitus ([place])   Hyperlipidemia   Abnormal liver function   Hypokalemia   Hyponatremia   Discharge Instructions:    Person Under Monitoring Name: Masayuki Sakai  Location: [place] [address]   Infection Prevention Recommendations for Individuals Confirmed to have, or Being Evaluated for, 2019 Novel Coronavirus (COVID-19) Infection Who Receive  Care at Home  Individuals who are confirmed to have, or are being evaluated for, COVID-19 should follow the prevention steps below until a healthcare provider or local or state health department says they can return to normal activities.  Stay home except to get medical care You should restrict activities outside your home, except for getting medical care. Do not go to work, school, or  public areas, and do not use public transportation or taxis.  Call ahead before visiting your doctor Before your medical appointment, call the healthcare provider and tell them that you have, or are being evaluated for, COVID-19 infection. This will help the healthcare providers office take steps to keep other people from getting infected. Ask your healthcare provider to call the local or state health department.  Monitor your symptoms Seek prompt medical attention if your illness is worsening (e.g., difficulty breathing). Before going to your medical appointment, call the healthcare provider and tell them that you have, or are being evaluated for, COVID-19 infection. Ask your healthcare provider to call the local or state health department.  Wear a facemask You should wear a facemask that covers your nose and mouth when you are in the same room with other people and when you visit a healthcare provider. People who live with or visit you should also wear a facemask while they are in the same room with you.  Separate yourself from other people in your home As much as possible, you should stay in a different room from other people in your home. Also, you should use a separate bathroom, if available.  Avoid sharing household items You should not share dishes, drinking glasses, cups, eating utensils, towels, bedding, or other items with other people in your home. After using these items, you should wash them thoroughly with soap and water.  Cover your coughs and sneezes Cover your mouth and nose with a tissue when you cough or sneeze, or you can cough or sneeze into your sleeve. Throw used tissues in a lined trash can, and immediately wash your hands with soap and water for at least 20 seconds or use an alcohol-based hand rub.  Wash your [facility] your hands often and thoroughly with soap and water for at least 20 seconds. You can use an alcohol-based hand sanitizer if soap and water  are not available and if your hands are not visibly dirty. Avoid touching your eyes, nose, and mouth with unwashed hands.   Prevention Steps for Caregivers and Household Members of Individuals Confirmed to have, or Being Evaluated for, COVID-19 Infection Being Cared for in the Home  If you live with, or provide care at home for, a person confirmed to have, or being evaluated for, COVID-19 infection please follow these guidelines to prevent infection:  Follow healthcare providers instructions Make sure that you understand and can help the patient follow any healthcare provider instructions for all care.  Provide for the patients basic needs You should help the patient with basic needs in the home and provide support for getting groceries, prescriptions, and other personal needs.  Monitor the patients symptoms If they are getting sicker, call his or her medical provider and tell them that the patient has, or is being evaluated for, COVID-19 infection. This will help the healthcare providers office take steps to keep other people from getting infected. Ask the healthcare provider to call the local or state health department.  Limit the number of people who have contact with the patient  If possible, have  only one caregiver for the patient.  Other household members should stay in another home or place of residence. If this is not possible, they should stay  in another room, or be separated from the patient as much as possible. Use a separate bathroom, if available.  Restrict visitors who do not have an essential need to be in the home.  Keep older adults, very young children, and other sick people away from the patient Keep older adults, very young children, and those who have compromised immune systems or chronic health conditions away from the patient. This includes people with chronic heart, lung, or kidney conditions, diabetes, and cancer.  Ensure good ventilation Make sure  that shared spaces in the home have good air flow, such as from an air conditioner or an opened window, weather permitting.  Wash your hands often  Wash your hands often and thoroughly with soap and water for at least 20 seconds. You can use an alcohol based hand sanitizer if soap and water are not available and if your hands are not visibly dirty.  Avoid touching your eyes, nose, and mouth with unwashed hands.  Use disposable paper towels to dry your hands. If not available, use dedicated cloth towels and replace them when they become wet.  Wear a facemask and gloves  Wear a disposable facemask at all times in the room and gloves when you touch or have contact with the patients blood, body fluids, and/or secretions or excretions, such as sweat, saliva, sputum, nasal mucus, vomit, urine, or feces.  Ensure the mask fits over your nose and mouth tightly, and do not touch it during use.  Throw out disposable facemasks and gloves after using them. Do not reuse.  Wash your hands immediately after removing your facemask and gloves.  If your personal clothing becomes contaminated, carefully remove clothing and launder. Wash your hands after handling contaminated clothing.  Place all used disposable facemasks, gloves, and other waste in a lined container before disposing them with other household waste.  Remove gloves and wash your hands immediately after handling these items.  Do not share dishes, glasses, or other household items with the patient  Avoid sharing household items. You should not share dishes, drinking glasses, cups, eating utensils, towels, bedding, or other items with a patient who is confirmed to have, or being evaluated for, COVID-19 infection.  After the person uses these items, you should wash them thoroughly with soap and water.  Wash laundry thoroughly  Immediately remove and wash clothes or bedding that have blood, body fluids, and/or secretions or excretions, such  as sweat, saliva, sputum, nasal mucus, vomit, urine, or feces, on them.  Wear gloves when handling laundry from the patient.  Read and follow directions on labels of laundry or clothing items and detergent. In general, wash and dry with the warmest temperatures recommended on the label.  Clean all areas the individual has used often  Clean all touchable surfaces, such as counters, tabletops, doorknobs, bathroom fixtures, toilets, phones, keyboards, tablets, and bedside tables, every day. Also, clean any surfaces that may have blood, body fluids, and/or secretions or excretions on them.  Wear gloves when cleaning surfaces the patient has come in contact with.  Use a diluted bleach solution (e.g., dilute bleach with 1 part bleach and 10 parts water) or a household disinfectant with a label that says EPA-registered for coronaviruses. To make a bleach solution at home, add 1 tablespoon of bleach to 1 quart (4 cups) of water. For  a larger supply, add  cup of bleach to 1 gallon (16 cups) of water.  Read labels of cleaning products and follow recommendations provided on product labels. Labels contain instructions for safe and effective use of the cleaning product including precautions you should take when applying the product, such as wearing gloves or eye protection and making sure you have good ventilation during use of the product.  Remove gloves and wash hands immediately after cleaning.  Monitor yourself for signs and symptoms of illness Caregivers and household members are considered close contacts, should monitor their health, and will be asked to limit movement outside of the home to the extent possible. Follow the monitoring steps for close contacts listed on the symptom monitoring form.   ? If you have additional questions, contact your local health department or call the epidemiologist on call at [phone] (available 24/7). ? This guidance is subject to change. For the most  up-to-date guidance from CDC, please refer to their website: YouBlogs.pl    Activity:  As tolerated   Discharge Instructions    Call MD for:  difficulty breathing, headache or visual disturbances   Complete by: As directed    Call MD for:  extreme fatigue   Complete by: As directed    Call MD for:  persistant nausea and vomiting   Complete by: As directed    Call MD for:  temperature >100.4   Complete by: As directed    Diet - low sodium heart healthy   Complete by: As directed    Diet Carb Modified   Complete by: As directed    Discharge instructions   Complete by: As directed    Follow with Primary MD  [name], MD in 1-2 weeks  Please get a complete blood count and chemistry panel checked by your Primary MD at your next visit, and again as instructed by your Primary MD.  Get Medicines reviewed and adjusted: Please take all your medications with you for your next visit with your Primary MD  Laboratory/radiological data: Please request your Primary MD to go over all hospital tests and procedure/radiological results at the follow up, please ask your Primary MD to get all Hospital records sent to his/her office.  In some cases, they will be blood work, cultures and biopsy results pending at the time of your discharge. Please request that your primary care M.D. follows up on these results.  Also Note the following: If you experience worsening of your admission symptoms, develop shortness of breath, life threatening emergency, suicidal or homicidal thoughts you must seek medical attention immediately by calling 911 or calling your MD immediately  if symptoms less severe.  You must read complete instructions/literature along with all the possible adverse reactions/side effects for all the Medicines you take and that have been prescribed to you. Take any new Medicines after you have completely understood and  accpet all the possible adverse reactions/side effects.   Do not drive when taking Pain medications or sleeping medications (Benzodaizepines)  Do not take more than prescribed Pain, Sleep and Anxiety Medications. It is not advisable to combine anxiety,sleep and pain medications without talking with your primary care practitioner  Special Instructions: If you have smoked or chewed Tobacco  in the last 2 yrs please stop smoking, stop any regular Alcohol  and or any Recreational drug use.  Wear Seat belts while driving.  Please note: You were cared for by a hospitalist during your hospital stay. Once you are discharged, your  primary care physician will handle any further medical issues. Please note that NO REFILLS for any discharge medications will be authorized once you are discharged, as it is imperative that you return to your primary care physician (or establish a relationship with a primary care physician if you do not have one) for your post hospital discharge needs so that they can reassess your need for medications and monitor your lab values.   Increase activity slowly   Complete by: As directed      Allergies as of 11/04/2019   No Known Allergies     Medication List    STOP taking these medications   azithromycin 250 MG tablet Commonly known as: ZITHROMAX   cyclobenzaprine 5 MG tablet Commonly known as: FLEXERIL     TAKE these medications   Accu-Chek Guide test strip Generic drug: glucose blood USE TO CHECK BLOOD SUGAR ONCE A DAY   accu-chek multiclix lancets Use as instructed   blood glucose meter kit and supplies Dispense based on patient and insurance preference. Once per day - fasting or 2 hrs after meal  Dx: E11.65.   dexamethasone 2 MG tablet Commonly known as: DECADRON Take 1 tablet (2 mg total) by mouth daily for 3 days.   glipiZIDE 10 MG 24 hr tablet Commonly known as: GLUCOTROL XL TAKE 1 TABLET(10 MG) BY MOUTH TWICE DAILY What changed: See the new  instructions.   NIFEdipine 90 MG 24 hr tablet Commonly known as: Procardia XL Take 1 tablet (90 mg total) by mouth daily.   ondansetron 4 MG tablet Commonly known as: ZOFRAN Take 1 tablet (4 mg total) by mouth every 6 (six) hours. What changed:   when to take this  reasons to take this   pioglitazone 15 MG tablet Commonly known as: ACTOS TAKE 1 TABLET(15 MG) BY MOUTH DAILY What changed: See the new instructions.   simvastatin 40 MG tablet Commonly known as: ZOCOR Take 1 tablet (40 mg total) by mouth daily at 6 PM.       No Known Allergies   Consultations:   None  Other Procedures/Studies: Dg Chest 2 View  Result Date: 10/28/2019 CLINICAL DATA:  Cough fever EXAM: CHEST - 2 VIEW COMPARISON:  Report 06/14/2002 FINDINGS: Mild cardiomegaly. Possible patchy peripheral airspace opacity at the right CP angle. No pneumothorax. IMPRESSION: 1. Possible patchy ground-glass opacity at the peripheral right base as may be seen with pneumonia, including atypical or viral etiology. 2. Mild cardiomegaly Electronically Signed   By: Donavan Foil M.D.   On: 10/28/2019 15:58   Ct Angio Chest Pe W Or Wo Contrast  Result Date: 10/31/2019 CLINICAL DATA:  Covid positive, dyspnea EXAM: CT ANGIOGRAPHY CHEST WITH CONTRAST TECHNIQUE: Multidetector CT imaging of the chest was performed using the standard protocol during bolus administration of intravenous contrast. Multiplanar CT image reconstructions and MIPs were obtained to evaluate the vascular anatomy. CONTRAST:  157m OMNIPAQUE IOHEXOL 350 MG/ML SOLN COMPARISON:  None. FINDINGS: Cardiovascular: There is a optimal opacification of the pulmonary arteries. There is no central,segmental, or subsegmental filling defects within the pulmonary arteries. There is mild cardiomegaly. No pericardial effusion. No evidence of right ventricular heart strain. There is normal three-vessel brachiocephalic anatomy without proximal stenosis. The thoracic aorta is  normal in appearance. Mediastinum/Nodes: Scattered small pretracheal, prevascular and right hilar lymph nodes are seen. The largest within the right hilar region measuring 1.3 cm. Thyroid gland, trachea, and esophagus demonstrate no significant findings. Lungs/Pleura: There is multifocal peripherally based rounded patchy airspace  opacities seen throughout both lungs, predominantly within the right lower lung. There are air bronchograms seen in the posterior right lower lung. No pleural effusion is seen. Upper Abdomen: No acute abnormalities present in the visualized portions of the upper abdomen. Musculoskeletal: No chest wall abnormality. No acute or significant osseous findings. Review of the MIP images confirms the above findings. IMPRESSION: No central, segmental, or subsegmental pulmonary embolism. Multifocal patchy rounded airspace opacity seen throughout both lungs, predominantly within the right lower lobe, which is likely consistent with multifocal atypical pneumonia. Mild cardiomegaly Reactive mediastinal and right hilar adenopathy. Electronically Signed   By: Prudencio Pair M.D.   On: 10/31/2019 02:39   Dg Chest Port 1 View  Result Date: 10/30/2019 CLINICAL DATA:  COVID-19 positivity with fever, initial encounter EXAM: PORTABLE CHEST 1 VIEW COMPARISON:  10/28/2019 FINDINGS: Cardiac shadow is enlarged but stable. The lungs are well aerated bilaterally. Increasing bibasilar airspace opacity is noted consistent with the given clinical history. No sizable effusion is noted. No bony abnormality is seen. IMPRESSION: Increasing airspace opacity in the bases right greater than left consistent with the COVID-19 positivity. Electronically Signed   By: Inez Catalina M.D.   On: 10/30/2019 23:05   US Abdomen Limited Ruq  Result Date: 10/31/2019 CLINICAL DATA:  Abnormal liver function tests, covid positive EXAM: ULTRASOUND ABDOMEN LIMITED RIGHT UPPER QUADRANT COMPARISON:  None. FINDINGS: Gallbladder: The  gallbladder is contracted. No gallbladder stones. The gallbladder wall thickness is 2.5 mm. No sonographic Murphy sign noted by sonographer. Common bile duct: Diameter: 3 mm Liver: No focal lesion identified. Within normal limits in parenchymal echogenicity. Portal vein is patent on color Doppler imaging with normal direction of blood flow towards the liver. Other: None. IMPRESSION: Normal examination. Electronically Signed   By: Prudencio Pair M.D.   On: 10/31/2019 02:40     TODAY-DAY OF DISCHARGE:  Subjective:   [name] today has no headache,no chest abdominal pain,no new weakness tingling or numbness, feels much better wants to go home today.   Objective:   Blood pressure 132/90, pulse 72, temperature 98.6 F (37 C), temperature source Oral, resp. rate 18, height 6' (1.829 m), weight 104 kg, SpO2 99 %.  Intake/Output Summary (Last 24 hours) at 11/04/2019 0955 Last data filed at 11/04/2019 0600 Gross per 24 hour  Intake 960 ml  Output 1350 ml  Net -390 ml   Filed Weights   10/30/19 2201 10/31/19 1800  Weight: 103 kg 104 kg    Exam: Awake Alert, Oriented *3, No new F.N deficits, Normal affect [place].AT,PERRAL Supple Neck,No JVD, No cervical lymphadenopathy appriciated.  Symmetrical Chest wall movement, Good air movement bilaterally, CTAB RRR,No Gallops,Rubs or new Murmurs, No Parasternal Heave +ve B.Sounds, Abd Soft, Non tender, No organomegaly appriciated, No rebound -guarding or rigidity. No Cyanosis, Clubbing or edema, No new Rash or bruise   PERTINENT RADIOLOGIC STUDIES: Dg Chest 2 View  Result Date: 10/28/2019 CLINICAL DATA:  Cough fever EXAM: CHEST - 2 VIEW COMPARISON:  Report 06/14/2002 FINDINGS: Mild cardiomegaly. Possible patchy peripheral airspace opacity at the right CP angle. No pneumothorax. IMPRESSION: 1. Possible patchy ground-glass opacity at the peripheral right base as may be seen with pneumonia, including atypical or viral etiology. 2. Mild cardiomegaly  Electronically Signed   By: Donavan Foil M.D.   On: 10/28/2019 15:58   Ct Angio Chest Pe W Or Wo Contrast  Result Date: 10/31/2019 CLINICAL DATA:  Covid positive, dyspnea EXAM: CT ANGIOGRAPHY CHEST WITH CONTRAST TECHNIQUE: Multidetector CT imaging  of the chest was performed using the standard protocol during bolus administration of intravenous contrast. Multiplanar CT image reconstructions and MIPs were obtained to evaluate the vascular anatomy. CONTRAST:  111m OMNIPAQUE IOHEXOL 350 MG/ML SOLN COMPARISON:  None. FINDINGS: Cardiovascular: There is a optimal opacification of the pulmonary arteries. There is no central,segmental, or subsegmental filling defects within the pulmonary arteries. There is mild cardiomegaly. No pericardial effusion. No evidence of right ventricular heart strain. There is normal three-vessel brachiocephalic anatomy without proximal stenosis. The thoracic aorta is normal in appearance. Mediastinum/Nodes: Scattered small pretracheal, prevascular and right hilar lymph nodes are seen. The largest within the right hilar region measuring 1.3 cm. Thyroid gland, trachea, and esophagus demonstrate no significant findings. Lungs/Pleura: There is multifocal peripherally based rounded patchy airspace opacities seen throughout both lungs, predominantly within the right lower lung. There are air bronchograms seen in the posterior right lower lung. No pleural effusion is seen. Upper Abdomen: No acute abnormalities present in the visualized portions of the upper abdomen. Musculoskeletal: No chest wall abnormality. No acute or significant osseous findings. Review of the MIP images confirms the above findings. IMPRESSION: No central, segmental, or subsegmental pulmonary embolism. Multifocal patchy rounded airspace opacity seen throughout both lungs, predominantly within the right lower lobe, which is likely consistent with multifocal atypical pneumonia. Mild cardiomegaly Reactive mediastinal and right  hilar adenopathy. Electronically Signed   By: BPrudencio PairM.D.   On: 10/31/2019 02:39   Dg Chest Port 1 View  Result Date: 10/30/2019 CLINICAL DATA:  COVID-19 positivity with fever, initial encounter EXAM: PORTABLE CHEST 1 VIEW COMPARISON:  10/28/2019 FINDINGS: Cardiac shadow is enlarged but stable. The lungs are well aerated bilaterally. Increasing bibasilar airspace opacity is noted consistent with the given clinical history. No sizable effusion is noted. No bony abnormality is seen. IMPRESSION: Increasing airspace opacity in the bases right greater than left consistent with the COVID-19 positivity. Electronically Signed   By: MInez CatalinaM.D.   On: 10/30/2019 23:05   [place]domen Limited Ruq  Result Date: 10/31/2019 CLINICAL DATA:  Abnormal liver function tests, covid positive EXAM: ULTRASOUND ABDOMEN LIMITED RIGHT UPPER QUADRANT COMPARISON:  None. FINDINGS: Gallbladder: The gallbladder is contracted. No gallbladder stones. The gallbladder wall thickness is 2.5 mm. No sonographic Murphy sign noted by sonographer. Common bile duct: Diameter: 3 mm Liver: No focal lesion identified. Within normal limits in parenchymal echogenicity. Portal vein is patent on color Doppler imaging with normal direction of blood flow towards the liver. Other: None. IMPRESSION: Normal examination. Electronically Signed   By: BPrudencio PairM.D.   On: 10/31/2019 02:40     PERTINENT LAB RESULTS: CBC: Recent Labs    11/03/19 0245 11/04/19 0455  WBC 6.4 8.1  HGB 13.0 14.0  HCT 36.6* 39.3  PLT 267 309   CMET CMP     Component Value Date/Time   NA 139 11/04/2019 0455   NA 140 08/09/2019 1723   K 3.5 11/04/2019 0455   CL 103 11/04/2019 0455   CO2 25 11/04/2019 0455   GLUCOSE 141 (H) 11/04/2019 0455   BUN 17 11/04/2019 0455   BUN 29 (H) 08/09/2019 1723   CREATININE 0.89 11/04/2019 0455   CALCIUM 9.0 11/04/2019 0455   PROT 7.0 11/04/2019 0455   PROT 7.5 07/25/2019 1726   ALBUMIN 3.1 (L) 11/04/2019 0455    ALBUMIN 4.9 07/25/2019 1726   AST 40 11/04/2019 0455   ALT 30 11/04/2019 0455   ALKPHOS 59 11/04/2019 0455   BILITOT 0.7 11/04/2019  0455   BILITOT 0.5 07/25/2019 1726   GFRNONAA >60 11/04/2019 0455   GFRAA >60 11/04/2019 0455    GFR Estimated Creatinine Clearance: 118.4 mL/min (by C-G formula based on SCr of 0.89 mg/dL). No results for input(s): LIPASE, AMYLASE in the last 72 hours. No results for input(s): CKTOTAL, CKMB, CKMBINDEX, TROPONINI in the last 72 hours. Invalid input(s): POCBNP Recent Labs    11/03/19 0245 11/04/19 0455  DDIMER 0.31 <0.27   No results for input(s): HGBA1C in the last 72 hours. No results for input(s): CHOL, HDL, LDLCALC, TRIG, CHOLHDL, LDLDIRECT in the last 72 hours. No results for input(s): TSH, T4TOTAL, T3FREE, THYROIDAB in the last 72 hours.  Invalid input(s): FREET3 Recent Labs    11/03/19 0245 11/04/19 0455  FERRITIN 569* 600*   Coags: No results for input(s): INR in the last 72 hours.  Invalid input(s): PT Microbiology: Recent Results (from the past 240 hour(s))  Novel Coronavirus, NAA ([facility])     Status: Abnormal   Collection Time: 10/28/19 12:00 AM   Specimen: Nasopharyngeal(NP) swabs in vial transport medium   NASOPHARYNGE  TESTING  Result Value Ref Range Status   SARS-CoV-2, NAA Detected (A) Not Detected Final    Comment: This nucleic acid amplification test was developed and its performance characteristics determined by [employer]. Nucleic acid amplification tests include PCR and TMA. This test has not been FDA cleared or approved. This test has been authorized by FDA under an Emergency Use Authorization (EUA). This test is only authorized for the duration of time the declaration that circumstances exist justifying the authorization of the emergency use of in vitro diagnostic tests for detection of SARS-CoV-2 virus and/or diagnosis of COVID-19 infection under section 564(b)(1) of the Act, 21 U.S.C. 376EGB-1(D)  (1), unless the authorization is terminated or revoked sooner. When diagnostic testing is negative, the possibility of a false negative result should be considered in the context of a patient's recent exposures and the presence of clinical signs and symptoms consistent with COVID-19. An individual without symptoms of COVID-19 and who is not shedding SARS-CoV-2 virus would  expect to have a negative (not detected) result in this assay.   SARS CORONAVIRUS 2 (TAT 6-24 HRS) Nasopharyngeal Nasopharyngeal Swab     Status: Abnormal   Collection Time: 10/28/19  6:25 PM   Specimen: Nasopharyngeal Swab  Result Value Ref Range Status   SARS Coronavirus 2 POSITIVE (A) NEGATIVE Final    Comment: EMAILED LORI BERDIK '@[id number]'$  ON 11.10.2020 BY TCALDWELL MT. (NOTE) SARS-CoV-2 target nucleic acids are DETECTED. The SARS-CoV-2 RNA is generally detectable in upper and lower respiratory specimens during the acute phase of infection. Positive results are indicative of active infection with SARS-CoV-2. Clinical  correlation with patient history and other diagnostic information is necessary to determine patient infection status. Positive results do  not rule out bacterial infection or co-infection with other viruses. The expected result is Negative. Fact Sheet for Patients: SugarRoll.be Fact Sheet for Healthcare Providers: https://www.woods-mathews.com/ This test is not yet approved or cleared by the [place] FDA and  has been authorized for detection and/or diagnosis of SARS-CoV-2 by FDA under an Emergency Use Authorization (EUA). This EUA will remain  in effect (meaning this test can be used) for the duration of the COVID-19 d eclaration under Section 564(b)(1) of the Act, 21 U.S.C. section 360bbb-3(b)(1), unless the authorization is terminated or revoked sooner. Performed at [facility], [place] [address] [place], [place] [address]   Blood Culture  (routine  x 2)     Status: None (Preliminary result)   Collection Time: 10/30/19 10:37 PM   Specimen: BLOOD  Result Value Ref Range Status   Specimen Description   Final    BLOOD RIGHT ANTECUBITAL Performed at [facility] [address] [place], [place] [address]    Special Requests   Final    BOTTLES DRAWN AEROBIC AND ANAEROBIC Blood Culture results may not be optimal due to an excessive volume of blood received in culture bottles Performed at [place] [address] [place], [place] [address]    Culture   Final    NO GROWTH 3 DAYS Performed at [facility], [place] [address], [place], [place] [address]    Report Status PENDING  Incomplete  Blood Culture (routine x 2)     Status: None (Preliminary result)   Collection Time: 10/30/19 11:32 PM   Specimen: BLOOD LEFT HAND  Result Value Ref Range Status   Specimen Description   Final    BLOOD LEFT HAND Performed at [facility] [address] [place], [place] [address]    Special Requests   Final    BOTTLES DRAWN AEROBIC AND ANAEROBIC Blood Culture results may not be optimal due to an inadequate volume of blood received in culture bottles Performed at [facility] [address] [place], [place] [address]    Culture   Final    NO GROWTH 3 DAYS Performed at [facility], [place] [address] [place], [place] [address]    Report Status PENDING  Incomplete    FURTHER DISCHARGE INSTRUCTIONS:  Get Medicines reviewed and adjusted: Please take all your medications with you for your next visit with your Primary MD  Laboratory/radiological data: Please request your Primary MD to go over all hospital tests and procedure/radiological results at the follow up, please ask your Primary MD to get all Hospital records sent to his/her office.  In some cases, they will be blood work, cultures and biopsy results pending at the time of your discharge.  Please request that your primary care M.D. goes through all the records of your hospital data and follows up on these results.  Also Note the following: If you experience worsening of your admission symptoms, develop shortness of breath, life threatening emergency, suicidal or homicidal thoughts you must seek medical attention immediately by calling 911 or calling your MD immediately  if symptoms less severe.  You must read complete instructions/literature along with all the possible adverse reactions/side effects for all the Medicines you take and that have been prescribed to you. Take any new Medicines after you have completely understood and accpet all the possible adverse reactions/side effects.   Do not drive when taking Pain medications or sleeping medications (Benzodaizepines)  Do not take more than prescribed Pain, Sleep and Anxiety Medications. It is not advisable to combine anxiety,sleep and pain medications without talking with your primary care practitioner  Special Instructions: If you have smoked or chewed Tobacco  in the last 2 yrs please stop smoking, stop any regular Alcohol  and or any Recreational drug use.  Wear Seat belts while driving.  Please note: You were cared for by a hospitalist during your hospital stay. Once you are discharged, your primary care physician will handle any further medical issues. Please note that NO REFILLS for any discharge medications will be authorized once you are discharged, as it is imperative that you return to your primary  care physician (or establish a relationship with a primary care physician if you do not have one) for your post hospital discharge needs so that they can reassess your need for medications and monitor your lab values.  Total Time spent coordinating discharge including counseling, education and face to face time equals 35 minutes.  SignedOren Binet 11/04/2019 9:55 AM

## 2019-11-04 NOTE — TOC Progression Note (Signed)
Transition of Care [facility]) - Progression Note    Patient Details  Name: Dylan Abbott MRN: [id number] Date of Birth: 09-06-1966  Transition of [facility]) CM/SW Contact  Joaquin Courts, RN Phone Number: 11/04/2019, 1:55 PM  Clinical Narrative:    PTAR transportation home arranged.         Expected Discharge Plan and Services           Expected Discharge Date: 11/04/19                                     Social Determinants of Health (SDOH) Interventions    Readmission Risk Interventions No flowsheet data found.

## 2019-11-05 ENCOUNTER — Other Ambulatory Visit: Payer: Self-pay | Admitting: Family Medicine

## 2019-11-05 DIAGNOSIS — E1165 Type 2 diabetes mellitus with hyperglycemia: Secondary | ICD-10-CM

## 2019-11-05 LAB — CULTURE, BLOOD (ROUTINE X 2)
Culture: NO GROWTH
Culture: NO GROWTH

## 2019-11-07 ENCOUNTER — Telehealth: Payer: Self-pay | Admitting: Family Medicine

## 2019-11-07 NOTE — Telephone Encounter (Signed)
Reason for CRM: Nunzio Cory, rn case manager, called to set up a hospital f/u for this pat I called her and she wanted me to call pt directly . but ,wanted to let Dr.Greene know that she had concerns  about pt welfare.  Maybe someone coulld reach out to her  At       [phone] ext [phone]

## 2019-11-08 ENCOUNTER — Telehealth (INDEPENDENT_AMBULATORY_CARE_PROVIDER_SITE_OTHER): Payer: PRIVATE HEALTH INSURANCE | Admitting: Family Medicine

## 2019-11-08 ENCOUNTER — Other Ambulatory Visit: Payer: Self-pay

## 2019-11-08 DIAGNOSIS — J1289 Other viral pneumonia: Secondary | ICD-10-CM

## 2019-11-08 DIAGNOSIS — U071 COVID-19: Secondary | ICD-10-CM | POA: Diagnosis not present

## 2019-11-08 DIAGNOSIS — E1165 Type 2 diabetes mellitus with hyperglycemia: Secondary | ICD-10-CM

## 2019-11-08 DIAGNOSIS — J1282 Pneumonia due to coronavirus disease 2019: Secondary | ICD-10-CM

## 2019-11-08 NOTE — Patient Instructions (Addendum)
Check blood sugars twice per day and call me with those readings in 3 to 4 days.  Follow-up in office December 1.  If any worsening shortness of breath, fevers, or other worsening symptoms prior to that time, be seen in the emergency room.

## 2019-11-08 NOTE — Progress Notes (Signed)
Virtual Visit via Telephone Note  I connected with Dylan Abbott on 11/08/19 at 5:40 PM by telephone and verified that I am speaking with the correct person using two identifiers.   I discussed the limitations, risks, security and privacy concerns of performing an evaluation and management service by telephone and the availability of in person appointments. I also discussed with the patient that there may be a patient responsible charge related to this service. The patient expressed understanding and agreed to proceed, consent obtained  Chief complaint: COVID-19 pneumonia  History of Present Illness: Dylan Abbott is a 53 y.o. male Virtual visit for hospital follow-up for COVID-19 pneumonia.  Follow-up recommendations of PCP follow-up 1 to 2 weeks, CMP, CBC in 1 week, repeat chest x-ray in 4 to 6 weeks.  COVID-19 pneumonia Admitted November 11, discharged November 16. Initial diagnosis of November 9.  Increasing cough, fever, shortness of breath, acute hypoxic respiratory failure requiring O2 supplementation.  Transfer to [facility] for further management.  Treated with remdesivir -completed inpatient, steroids, discharged on tapering steroids for a few days.  History of uncontrolled diabetes with A1c 9.7, Lantus/sliding scale insulin, switch back to home regimen on discharge.  Noted to have transaminitis, mild likely secondary to COVID-19 with resolution of LFTs.  Discussed 3 weeks of total isolation from onset of symptoms given severity of illness.  Feeling better since discharged. Shortness of breath better.still some cough, but not as much as in the hospital.  No fevers. Drinking fluids ok.  Has not checked blood sugars in past few days. Taking actos and glipizide. Finished dexamethasone yesterday.       Patient Active Problem List   Diagnosis Date Noted  . COVID-19 virus infection 10/31/2019  . Abnormal liver function 10/31/2019  . Hypokalemia 10/31/2019  .  Hyponatremia 10/31/2019  . Diabetes mellitus ([place]) 11/13/2017  . Hyperlipidemia 11/13/2017  . Hypertensive disorder 11/13/2017   Past Medical History:  Diagnosis Date  . Diabetes mellitus without complication ([place])   . Hyperlipidemia   . Hypertension    No past surgical history on file. No Known Allergies Prior to Admission medications   Medication Sig Start Date End Date Taking? Authorizing Provider  Accu-Chek FastClix Lancets MISC USE TO CHECK BLOOD SUGAR ONCE PER DAY 11/05/19  Yes [name], MD  ACCU-CHEK GUIDE test strip USE TO CHECK BLOOD SUGAR ONCE A DAY 10/25/19  Yes [name], MD  blood glucose meter kit and supplies Dispense based on patient and insurance preference. Once per day - fasting or 2 hrs after meal  Dx: E11.65. 07/25/19  Yes [name], MD  glipiZIDE (GLUCOTROL XL) 10 MG 24 hr tablet TAKE 1 TABLET(10 MG) BY MOUTH TWICE DAILY Patient taking differently: Take 10 mg by mouth 2 (two) times daily.  10/04/19  Yes [name], MD  NIFEdipine (PROCARDIA XL) 90 MG 24 hr tablet Take 1 tablet (90 mg total) by mouth daily. 07/25/19  Yes [name], MD  ondansetron (ZOFRAN) 4 MG tablet Take 1 tablet (4 mg total) by mouth every 6 (six) hours. Patient taking differently: Take 4 mg by mouth every 6 (six) hours as needed for nausea or vomiting.  10/28/19  Yes Messick, Wallis Bamberg, MD  pioglitazone (ACTOS) 15 MG tablet TAKE 1 TABLET(15 MG) BY MOUTH DAILY Patient taking differently: Take 15 mg by mouth daily.  10/04/19  Yes [name], MD  simvastatin (ZOCOR) 40 MG tablet Take 1 tablet (40 mg total) by  mouth daily at 6 PM. 07/25/19  Yes [name], MD   Social History   Socioeconomic History  . Marital status: Married    Spouse name: Not on file  . Number of children: 2  . Years of education: Not on file  . Highest education level: Not on file  Occupational History  . Occupation: environmental  Social Needs  . Financial resource strain:  Not on file  . Food insecurity    Worry: Not on file    Inability: Not on file  . Transportation needs    Medical: Not on file    Non-medical: Not on file  Tobacco Use  . Smoking status: Never Smoker  . Smokeless tobacco: Never Used  Substance and Sexual Activity  . Alcohol use: Never    Alcohol/week: 0.0 standard drinks    Frequency: Never  . Drug use: Never  . Sexual activity: Yes  Lifestyle  . Physical activity    Days per week: Not on file    Minutes per session: Not on file  . Stress: Not on file  Relationships  . Social Herbalist on phone: Not on file    Gets together: Not on file    Attends religious service: Not on file    Active member of club or organization: Not on file    Attends meetings of clubs or organizations: Not on file    Relationship status: Not on file  . Intimate partner violence    Fear of current or ex partner: Not on file    Emotionally abused: Not on file    Physically abused: Not on file    Forced sexual activity: Not on file  Other Topics Concern  . Not on file  Social History Narrative  . Not on file     Observations/Objective: No respiratory distress, speaking in complete sentences, appropriate responses.  All questions answered.  Assessment and Plan: Pneumonia due to COVID-19 virus  Type 2 diabetes mellitus with hyperglycemia, without long-term current use of insulin (HCC)  Improving from COVID-19 infection with secondary pneumonia and acute hypoxic respiratory failure.  On room air and symptomatically improving.  Unknown diabetes control but anticipate improvement in readings coming off dexamethasone.    -Advised him to check twice per day and call with those readings in 3 to 4 days.  Continue same regimen for now.   -In office eval December 1 and can repeat blood work at that time, recheck LFTs to make sure they have remained stable and can evaluate diabetic control at that time as well.  Plan on repeat chest x-ray in  approximately 4 weeks.  Follow Up Instructions:  12/2 in office. ER precautions if worse.    I discussed the assessment and treatment plan with the patient. The patient was provided an opportunity to ask questions and all were answered. The patient agreed with the plan and demonstrated an understanding of the instructions.   The patient was advised to call back or seek an in-person evaluation if the symptoms worsen or if the condition fails to improve as anticipated.  I provided 9 minutes of non-face-to-face time during this encounter.  Signed,   Merri Ray, MD [facility].  11/08/19

## 2019-11-08 NOTE — Progress Notes (Signed)
CC- hospital f/u- Patient wants to know if he still need to be on the rx dexamethasone cause he was only given 3 tab. Coughing, no fever, no chest pain, little body aches. Patient stated he feel a little better but not at 100%

## 2019-11-12 ENCOUNTER — Telehealth: Payer: Self-pay | Admitting: Family Medicine

## 2019-11-12 NOTE — Telephone Encounter (Signed)
Nunzio Cory has faxed over [place] for pt to get help. He has a lot of medical concerns and she is very worried about him. She feels he needs extra help at home. Please advise Nunzio Cory  at [phone] if any questions.

## 2019-11-20 ENCOUNTER — Other Ambulatory Visit: Payer: Self-pay

## 2019-11-20 ENCOUNTER — Encounter: Payer: Self-pay | Admitting: Family Medicine

## 2019-11-20 ENCOUNTER — Ambulatory Visit (INDEPENDENT_AMBULATORY_CARE_PROVIDER_SITE_OTHER): Payer: PRIVATE HEALTH INSURANCE | Admitting: Family Medicine

## 2019-11-20 VITALS — BP 136/87 | HR 83 | Temp 98.8°F | Resp 12 | Wt 219.0 lb

## 2019-11-20 DIAGNOSIS — U071 COVID-19: Secondary | ICD-10-CM | POA: Diagnosis not present

## 2019-11-20 DIAGNOSIS — J1282 Pneumonia due to coronavirus disease 2019: Secondary | ICD-10-CM

## 2019-11-20 DIAGNOSIS — E1165 Type 2 diabetes mellitus with hyperglycemia: Secondary | ICD-10-CM | POA: Diagnosis not present

## 2019-11-20 DIAGNOSIS — J1289 Other viral pneumonia: Secondary | ICD-10-CM

## 2019-11-20 DIAGNOSIS — R7989 Other specified abnormal findings of blood chemistry: Secondary | ICD-10-CM | POA: Diagnosis not present

## 2019-11-20 DIAGNOSIS — E785 Hyperlipidemia, unspecified: Secondary | ICD-10-CM

## 2019-11-20 DIAGNOSIS — I10 Essential (primary) hypertension: Secondary | ICD-10-CM

## 2019-11-20 MED ORDER — NIFEDIPINE ER OSMOTIC RELEASE 90 MG PO TB24: 90.0000 mg | ORAL_TABLET | Freq: Every day | ORAL | 1 refills | Status: DC

## 2019-11-20 MED ORDER — PIOGLITAZONE HCL 15 MG PO TABS: ORAL_TABLET | ORAL | 1 refills | Status: DC

## 2019-11-20 MED ORDER — GLIPIZIDE ER 10 MG PO TB24: ORAL_TABLET | ORAL | 1 refills | Status: DC

## 2019-11-20 MED ORDER — METFORMIN HCL 500 MG PO TABS: 500.0000 mg | ORAL_TABLET | Freq: Every day | ORAL | 0 refills | Status: DC

## 2019-11-20 NOTE — Progress Notes (Signed)
Subjective:  Patient ID: Dylan Abbott, male    DOB: May 07, 1966  Age: 53 y.o. MRN: [id number]  CC:  Chief Complaint  Patient presents with  . Follow-up    Pt stated feeling much better form the COVID 10/28/19--positive  . Diabetes    HPI Dylan Abbott presents for   COVID-19 with associated pneumonia: Initial diagnosis COVID-19 November 9, admitted November 11-16 with pneumonia.  Hospital follow-up November 20, significant improved at that time. Some fatigue with exertion, overall getting better, but slowly improving. Energy level slowly improving. Occasional heartburn. Occasional pain in chest - similar as in hospital but less now. Shortness of breath also getting better.  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  Needs note.    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   Nephrology eval in August.  ACE inhibitor held.  Possibly hypotensive with elevated creatinine previously.  Plan for resumption of ACE at lower dose if labs stable.  Renal ultrasound within normal limits September 25.   A1c 8.1 in August.  Metformin held due to elevated creatinine.  At that time his glipizide was decreased to once per day given borderline low readings and potential decreased dosing for low GFR. Recent creatinine normal.  Elevated A1c at recent hospitalization as below.   Currently is taking to control XL 10 mg BID, Actos 15 mg daily. Recent recent creatinine normal at 0.89 on November 16 Treated with dexamethasone for COVID-19 pneumonia as above.  Microalbumin: overdue.  normal rate September 2019 Optho, foot exam, pneumovax:  Due for foot exam.  As above.  Home readings: 160 fasting today.  No symptomatic lows.   Lab Results  Component Value Date   HGBA1C 9.7 (H) 11/01/2019   HGBA1C 8.1 (H) 07/25/2019   HGBA1C 9.0 (A) 03/09/2019   Lab Results  Component Value Date   LDLCALC 81 03/09/2019   CREATININE  0.89 11/04/2019    Hypertension: On procardia 16m qd. Home readings: none  BP Readings from Last 3 Encounters:  11/20/19 136/87  11/04/19 135/87  10/28/19 (!) 143/73   Lab Results  Component Value Date   CREATININE 0.89 11/04/2019    Hyperlipidemia: Simvastatin once per day. No new myalgias.  Lab Results  Component Value Date   CHOL 159 03/09/2019   HDL 57 03/09/2019   LDLCALC 81 03/09/2019   TRIG 99 10/30/2019   CHOLHDL 2.8 03/09/2019   Lab Results  Component Value Date   ALT 30 11/04/2019   AST 40 11/04/2019   ALKPHOS 59 11/04/2019   BILITOT 0.7 11/04/2019    History Patient Active Problem List   Diagnosis Date Noted  . COVID-19 virus infection 10/31/2019  . Abnormal liver function 10/31/2019  . Hypokalemia 10/31/2019  . Hyponatremia 10/31/2019  . [place] 11/13/[place]  . Hyperlipidemia 11/13/2017  . Hypertensive disorder 11/13/2017   Past Medical History:  Diagnosis Date  . Diabetes mellitus without complication ([place]   . Hyperlipidemia   . Hypertension    No past surgical history on file. No Known Allergies Prior to Admission medications   Medication Sig Start Date End Date Taking? Authorizing Provider  Accu-Chek FastClix Lancets MISC USE TO CHECK BLOOD SUGAR ONCE PER DAY 11/05/19  Yes GWendie Agreste MD  ACCU-CHEK GUIDE test strip USE TO CHECK BLOOD SUGAR ONCE A DAY 10/25/19  Yes GWendie Agreste MD  blood glucose meter kit and supplies Dispense based on patient and insurance preference. Once per  day - fasting or 2 hrs after meal  Dx: E11.65. 07/25/19  Yes [name], MD  glipiZIDE (GLUCOTROL XL) 10 MG 24 hr tablet TAKE 1 TABLET(10 MG) BY MOUTH TWICE DAILY Patient taking differently: Take 10 mg by mouth 2 (two) times daily.  10/04/19  Yes [name], MD  NIFEdipine (PROCARDIA XL) 90 MG 24 hr tablet Take 1 tablet (90 mg total) by mouth daily. 07/25/19  Yes [name], MD  ondansetron (ZOFRAN) 4 MG tablet Take 1  tablet (4 mg total) by mouth every 6 (six) hours. Patient taking differently: Take 4 mg by mouth every 6 (six) hours as needed for nausea or vomiting.  10/28/19  Yes Messick, Wallis Bamberg, MD  pioglitazone (ACTOS) 15 MG tablet TAKE 1 TABLET(15 MG) BY MOUTH DAILY Patient taking differently: Take 15 mg by mouth daily.  10/04/19  Yes [name], MD  simvastatin (ZOCOR) 40 MG tablet Take 1 tablet (40 mg total) by mouth daily at 6 PM. 07/25/19  Yes [name], MD   Social History   Socioeconomic History  . Marital status: Married    Spouse name: Not on file  . Number of children: 2  . Years of education: Not on file  . Highest education level: Not on file  Occupational History  . Occupation: environmental  Social Needs  . Financial resource strain: Not on file  . Food insecurity    Worry: Not on file    Inability: Not on file  . Transportation needs    Medical: Not on file    Non-medical: Not on file  Tobacco Use  . Smoking status: Never Smoker  . Smokeless tobacco: Never Used  Substance and Sexual Activity  . Alcohol use: Never    Alcohol/week: 0.0 standard drinks    Frequency: Never  . Drug use: Never  . Sexual activity: Yes  Lifestyle  . Physical activity    Days per week: Not on file    Minutes per session: Not on file  . Stress: Not on file  Relationships  . Social Herbalist on phone: Not on file    Gets together: Not on file    Attends religious service: Not on file    Active member of club or organization: Not on file    Attends meetings of clubs or organizations: Not on file    Relationship status: Not on file  . Intimate partner violence    Fear of current or ex partner: Not on file    Emotionally abused: Not on file    Physically abused: Not on file    Forced sexual activity: Not on file  Other Topics Concern  . Not on file  Social History Narrative  . Not on file    Review of Systems Per HPI.   Objective:   Vitals:   11/20/19 1653   BP: 136/87  Pulse: 83  Resp: 12  Temp: 98.8 F (37.1 C)  SpO2: 98%  Weight: 219 lb (99.3 kg)     Physical Exam Vitals signs reviewed.  Constitutional:      Appearance: He is well-developed.  HENT:     Head: Normocephalic and atraumatic.  Eyes:     Pupils: Pupils are equal, round, and reactive to light.  Neck:     Vascular: No carotid bruit or JVD.  Cardiovascular:     Rate and Rhythm: Normal rate and regular rhythm.     Heart sounds: Normal heart  sounds. No murmur.  Pulmonary:     Effort: Pulmonary effort is normal. No respiratory distress.     Breath sounds: Normal breath sounds. No wheezing, rhonchi or rales.  Skin:    General: Skin is warm and dry.  Neurological:     Mental Status: He is alert and oriented to person, place, and time.        Assessment & Plan:  Dylan Abbott is a 53 y.o. male . Pneumonia due to COVID-19 virus  -Clinically improving.  Afebrile, no fevers at home.  Fatigue, dyspnea exertion slowly improving.  Previous chest symptoms in the hospital also improving.  Denies new or worsening symptoms at present time.  Lungs clear on exam.  -Continue symptomatic care, slow resumption of activities as tolerated.  Given the physical nature of his work, will hold out for 2 additional weeks with repeat evaluation.  Essential hypertension - Plan: NIFEdipine (PROCARDIA XL) 90 MG 24 hr tablet  -Stable on nifedipine.  Consider restart of ACE inhibitor depending on renal function, but recently was normal.  Plan on labs next visit  Type 2 diabetes mellitus with hyperglycemia, without long-term current use of insulin (HCC) - Plan: pioglitazone (ACTOS) 15 MG tablet, glipiZIDE (GLUCOTROL XL) 10 MG 24 hr tablet, metFORMIN (GLUCOPHAGE) 500 MG tablet  -Elevated A1C in hospital in part due to adjustment of meds during elevated creatinine as well as recent dexamethasone.  Restart Metformin low-dose given normal creatinine recently, recheck in 2 weeks with lab work.   Continue Actos, Glucotrol same dose for now.  Elevated serum creatinine  -Normalized recently.  Monitor for changes at next visit.  Maintenance of hydration discussed.  Hyperlipidemia, unspecified hyperlipidemia type  -Tolerating statin at current dose, plan on labs at follow-up in 2 weeks  Meds ordered this encounter  Medications  . NIFEdipine (PROCARDIA XL) 90 MG 24 hr tablet    Sig: Take 1 tablet (90 mg total) by mouth daily.    Dispense:  90 tablet    Refill:  1  . pioglitazone (ACTOS) 15 MG tablet    Sig: TAKE 1 TABLET(15 MG) BY MOUTH DAILY    Dispense:  90 tablet    Refill:  1  . glipiZIDE (GLUCOTROL XL) 10 MG 24 hr tablet    Sig: TAKE 1 TABLET(10 MG) BY MOUTH TWICE DAILY    Dispense:  180 tablet    Refill:  1  . metFORMIN (GLUCOPHAGE) 500 MG tablet    Sig: Take 1 tablet (500 mg total) by mouth daily with breakfast.    Dispense:  90 tablet    Refill:  0   Patient Instructions     Start metformin once per day.  Recheck in 2 weeks and we will do lab work at that time.  No other med changes for now.  Fatigue and shortness of breath should continue to improve.  We can recheck a chest x-ray at next visit.  If any worsening symptoms including any new or worsening chest pain, be seen in the emergency room.    If you have lab work done today you will be contacted with your lab results within the next 2 weeks.  If you have not heard from [place] then please contact us. The fastest way to get your results is to register for My Chart.   IF you received an x-ray today, you will receive an invoice from [facility]. Please contact [facility] at [phone] with questions or concerns regarding your invoice.   IF you  received labwork today, you will receive an invoice from [place]. Please contact [facility] at [phone] with questions or concerns regarding your invoice.   Our billing staff will not be able to assist you with questions regarding bills from these  companies.  You will be contacted with the lab results as soon as they are available. The fastest way to get your results is to activate your My Chart account. Instructions are located on the last page of this paperwork. If you have not heard from [place] regarding the results in 2 weeks, please contact this office.          Signed, Merri Ray, MD [facility]

## 2019-11-20 NOTE — Patient Instructions (Signed)
   Start metformin once per day.  Recheck in 2 weeks and we will do lab work at that time.  No other med changes for now.  Fatigue and shortness of breath should continue to improve.  We can recheck a chest x-ray at next visit.  If any worsening symptoms including any new or worsening chest pain, be seen in the emergency room.    If you have lab work done today you will be contacted with your lab results within the next 2 weeks.  If you have not heard from [place] then please contact us. The fastest way to get your results is to register for My Chart.   IF you received an x-ray today, you will receive an invoice from [facility]. Please contact [facility] at [phone] with questions or concerns regarding your invoice.   IF you received labwork today, you will receive an invoice from [place]. Please contact [facility] at [phone] with questions or concerns regarding your invoice.   Our billing staff will not be able to assist you with questions regarding bills from these companies.  You will be contacted with the lab results as soon as they are available. The fastest way to get your results is to activate your My Chart account. Instructions are located on the last page of this paperwork. If you have not heard from [place] regarding the results in 2 weeks, please contact this office.

## 2019-11-29 NOTE — Telephone Encounter (Signed)
I do not have paperwork..  See last visit, did not discuss home health at that time.  May need to schedule for visit with me to discuss specific concerns so we can get that ordered.  Please check into specific concerns from [place].

## 2019-11-29 NOTE — Telephone Encounter (Signed)
Dr. Carlota Raspberry do you have the paperwork for home health for this patient?  I have checked your box at the nurses station and in the physician's lounge and no paperwork there.  Please advise.

## 2019-12-04 ENCOUNTER — Ambulatory Visit: Payer: PRIVATE HEALTH INSURANCE | Admitting: Family Medicine

## 2019-12-04 NOTE — Telephone Encounter (Signed)
Patient is scheduled today 12/16 for appointment

## 2019-12-05 ENCOUNTER — Encounter: Payer: Self-pay | Admitting: Family Medicine

## 2020-01-24 ENCOUNTER — Other Ambulatory Visit: Payer: Self-pay | Admitting: Family Medicine

## 2020-01-24 DIAGNOSIS — E1165 Type 2 diabetes mellitus with hyperglycemia: Secondary | ICD-10-CM

## 2020-02-25 ENCOUNTER — Other Ambulatory Visit: Payer: Self-pay | Admitting: Family Medicine

## 2020-02-25 DIAGNOSIS — E1165 Type 2 diabetes mellitus with hyperglycemia: Secondary | ICD-10-CM

## 2020-02-25 NOTE — Telephone Encounter (Signed)
Requested Prescriptions  Pending Prescriptions Disp Refills  . metFORMIN (GLUCOPHAGE) 500 MG tablet [Pharmacy Med Name: METFORMIN 500MG TABLETS] 90 tablet 0    Sig: TAKE 1 TABLET(500 MG) BY MOUTH DAILY WITH BREAKFAST     Endocrinology:  Diabetes - Biguanides Failed - 02/25/2020  8:39 AM      Failed - HBA1C is between 0 and 7.9 and within 180 days    Hgb A1c MFr Bld  Date Value Ref Range Status  11/01/2019 9.7 (H) 4.8 - 5.6 % Final    Comment:    (NOTE) Pre diabetes:          5.7%-6.4% Diabetes:              >6.4% Glycemic control for   <7.0% adults with diabetes          Passed - Cr in normal range and within 360 days    Creatinine, Ser  Date Value Ref Range Status  11/04/2019 0.89 0.61 - 1.24 mg/dL Final         Passed - eGFR in normal range and within 360 days    GFR calc Af Amer  Date Value Ref Range Status  11/04/2019 >60 >60 mL/min Final   GFR calc non Af Amer  Date Value Ref Range Status  11/04/2019 >60 >60 mL/min Final         Passed - Valid encounter within last 6 months    Recent Outpatient Visits          3 months ago Pneumonia due to COVID-19 virus   [facility], Ranell Patrick, MD   3 months ago Pneumonia due to COVID-19 virus   [facility], Ranell Patrick, MD   3 months ago COVID-19   [facility], Ranell Patrick, MD   6 months ago Elevated serum creatinine   [facility], Ranell Patrick, MD   6 months ago Elevated serum creatinine   [facility], Ranell Patrick, MD

## 2020-04-24 ENCOUNTER — Other Ambulatory Visit: Payer: Self-pay

## 2020-04-24 ENCOUNTER — Ambulatory Visit: Payer: PRIVATE HEALTH INSURANCE | Admitting: Family Medicine

## 2020-04-24 ENCOUNTER — Encounter: Payer: Self-pay | Admitting: Family Medicine

## 2020-04-24 VITALS — BP 157/88 | HR 70 | Temp 98.1°F | Ht 72.0 in | Wt 234.8 lb

## 2020-04-24 DIAGNOSIS — E1165 Type 2 diabetes mellitus with hyperglycemia: Secondary | ICD-10-CM

## 2020-04-24 DIAGNOSIS — E785 Hyperlipidemia, unspecified: Secondary | ICD-10-CM

## 2020-04-24 DIAGNOSIS — I10 Essential (primary) hypertension: Secondary | ICD-10-CM | POA: Diagnosis not present

## 2020-04-24 DIAGNOSIS — Z1211 Encounter for screening for malignant neoplasm of colon: Secondary | ICD-10-CM | POA: Diagnosis not present

## 2020-04-24 DIAGNOSIS — J309 Allergic rhinitis, unspecified: Secondary | ICD-10-CM

## 2020-04-24 LAB — POCT GLYCOSYLATED HEMOGLOBIN (HGB A1C): Hemoglobin A1C: 9 % — AB (ref 4.0–5.6)

## 2020-04-24 LAB — GLUCOSE, POCT (MANUAL RESULT ENTRY): POC Glucose: 119 mg/dl — AB (ref 70–99)

## 2020-04-24 MED ORDER — PIOGLITAZONE HCL 15 MG PO TABS: ORAL_TABLET | ORAL | 1 refills | Status: DC

## 2020-04-24 MED ORDER — METFORMIN HCL 850 MG PO TABS: ORAL_TABLET | ORAL | 1 refills | Status: DC

## 2020-04-24 MED ORDER — NIFEDIPINE ER OSMOTIC RELEASE 90 MG PO TB24: 90.0000 mg | ORAL_TABLET | Freq: Every day | ORAL | 1 refills | Status: DC

## 2020-04-24 MED ORDER — GLIPIZIDE ER 10 MG PO TB24: ORAL_TABLET | ORAL | 1 refills | Status: DC

## 2020-04-24 MED ORDER — FLUTICASONE PROPIONATE 50 MCG/ACT NA SUSP: 2.0000 | Freq: Every day | NASAL | 6 refills | Status: DC

## 2020-04-24 MED ORDER — SIMVASTATIN 40 MG PO TABS: 40.0000 mg | ORAL_TABLET | Freq: Every day | ORAL | 1 refills | Status: DC

## 2020-04-24 NOTE — Progress Notes (Signed)
Subjective:  Patient ID: Dylan Abbott, male    DOB: [birth year]  Age: 55 y.o. MRN: [id number]  CC:  Chief Complaint  Patient presents with  . Diabetes    follow up and refills on med.     HPI Dylan Abbott presents for   Diabetes: Uncontrolled. With hyperglycemia, CKD, followed by nephrology.  Last discussed in December.  Had been hospitalized for pneumonia and COVID-19.[name]  Elevated A1c hospital likely due to adjust medications when elevated creatinine as well as dexamethasone.  Metformin was restarted low-dose given normal creatinine at that time with plan for recheck in 2 weeks with lab work.  Continue Actos and Glucotrol at 50 mg, 10 mg respectively.  Unfortunately has not returned for lab work since that time. Taking metformin 530m BID, Glipizide 183mBID, actos 15 mg qd.  Out of actos past 2 weeks, taking other meds.  Home readings - none.  No new side effects.  Microalbumin: normal ratio in 2019. On statin.   Not on ace-I, some concern in past with creatinine.  Optho, foot exam, pneumovax:  Agrees to referral to optho.  Colon ca screening - requests caologuard after discussion- aware of potential colonoscopy need if positive and repeat testing interval.   Lab Results  Component Value Date   HGBA1C 9.0 (A) 04/24/2020   HGBA1C 9.7 (H) 11/01/2019   HGBA1C 8.1 (H) 07/25/2019   Lab Results  Component Value Date   LDLCALC 148 (H) 04/24/2020   CREATININE 1.26 04/24/2020    Hyperlipidemia: simvastatin 4063md. No new side effects.  Lab Results  Component Value Date   CHOL 222 (H) 04/24/2020   HDL 50 04/24/2020   LDLCALC 148 (H) 04/24/2020   TRIG 132 04/24/2020   CHOLHDL 4.4 04/24/2020   Lab Results  Component Value Date   ALT 16 04/24/2020   AST 25 04/24/2020   ALKPHOS 60 04/24/2020   BILITOT 0.5 04/24/2020    Hypertension: Procardia XL 73m2mut of meds for 2 weeks.  Home readings:none.  BP Readings from Last 3 Encounters:  04/24/20 (!) 157/88    11/20/19 136/87  11/04/19 135/87   Lab Results  Component Value Date   CREATININE 1.26 04/24/2020   Allergies - no current meds.  Has tried antihistamines over-the-counter past without much relief.  Not currently on nasal spray.  Flare in symptoms past few weeks.  Breathing okay.  History Patient Active Problem List   Diagnosis Date Noted  . COVID-19 virus infection 10/31/2019  . Abnormal liver function 10/31/2019  . Hypokalemia 10/31/2019  . Hyponatremia 10/31/2019  . Diabetes mellitus (HCC)[place]/26/2018  . Hyperlipidemia 11/13/2017  . Hypertensive disorder 11/13/2017   Past Medical History:  Diagnosis Date  . Diabetes mellitus without complication (HCC)[place]. Hyperlipidemia   . Hypertension    No past surgical history on file. No Known Allergies Prior to Admission medications   Medication Sig Start Date End Date Taking? Authorizing Provider  Accu-Chek FastClix Lancets MISC USE TO CHECK BLOOD SUGAR EVERY DAY 01/24/20  Yes GreeWendie Agreste  ACCU-CHEK GUIDE test strip USE TO CHECK BLOOD SUGAR ONCE A DAY 10/25/19  Yes GreeWendie Agreste  blood glucose meter kit and supplies Dispense based on patient and insurance preference. Once per day - fasting or 2 hrs after meal  Dx: E11.65. 07/25/19  Yes GreeWendie Agreste  glipiZIDE (GLUCOTROL XL) 10 MG 24 hr tablet TAKE 1 TABLET(10 MG) BY MOUTH TWICE DAILY 11/20/19  Yes GreeCarlota Raspberry  Ranell Patrick, MD  metFORMIN (GLUCOPHAGE) 500 MG tablet TAKE 1 TABLET(500 MG) BY MOUTH DAILY WITH BREAKFAST 02/25/20  Yes [name], MD  NIFEdipine (PROCARDIA XL) 90 MG 24 hr tablet Take 1 tablet (90 mg total) by mouth daily. 11/20/19  Yes [name], MD  ondansetron (ZOFRAN) 4 MG tablet Take 1 tablet (4 mg total) by mouth every 6 (six) hours. Patient taking differently: Take 4 mg by mouth every 6 (six) hours as needed for nausea or vomiting.  10/28/19  Yes Valarie Merino, MD  pioglitazone (ACTOS) 15 MG tablet TAKE 1 TABLET(15 MG) BY MOUTH DAILY 11/20/19   Yes [name], MD  simvastatin (ZOCOR) 40 MG tablet Take 1 tablet (40 mg total) by mouth daily at 6 PM. 07/25/19  Yes [name], MD   Social History   Socioeconomic History  . Marital status: Married    Spouse name: Not on file  . Number of children: 2  . Years of education: Not on file  . Highest education level: Not on file  Occupational History  . Occupation: environmental  Tobacco Use  . Smoking status: Never Smoker  . Smokeless tobacco: Never Used  Substance and Sexual Activity  . Alcohol use: Never    Alcohol/week: 0.0 standard drinks  . Drug use: Never  . Sexual activity: Yes  Other Topics Concern  . Not on file  Social History Narrative  . Not on file   Social Determinants of Health   Financial Resource Strain:   . Difficulty of Paying Living Expenses:   Food Insecurity:   . Worried About Charity fundraiser in the Last Year:   . Arboriculturist in the Last Year:   Transportation Needs:   . Film/video editor (Medical):   [name] Lack of Transportation (Non-Medical):   Physical Activity:   . Days of Exercise per Week:   . Minutes of Exercise per Session:   Stress:   . Feeling of Stress :   Social Connections:   . Frequency of Communication with Friends and Family:   . Frequency of Social Gatherings with Friends and Family:   . Attends Religious Services:   . Active Member of Clubs or Organizations:   . Attends Archivist Meetings:   [name] Marital Status:   Intimate Partner Violence:   . Fear of Current or Ex-Partner:   . Emotionally Abused:   [name] Physically Abused:   . Sexually Abused:     Review of Systems  Constitutional: Negative for fatigue and unexpected weight change.  Eyes: Negative for visual disturbance.  Respiratory: Negative for cough, chest tightness and shortness of breath.   Cardiovascular: Negative for chest pain, palpitations and leg swelling.  Gastrointestinal: Negative for abdominal pain and blood in stool.   Neurological: Negative for dizziness, light-headedness and headaches.     Objective:   Vitals:   04/24/20 1336 04/24/20 1345  BP: (!) 160/95 (!) 157/88  Pulse: 70   Temp: 98.1 F (36.7 C)   TempSrc: Temporal   SpO2: 98%   Weight: 234 lb 12.8 oz (106.5 kg)   Height: 6' (1.829 m)      Physical Exam Vitals reviewed.  Constitutional:      Appearance: He is well-developed.  HENT:     Head: Normocephalic and atraumatic.  Eyes:     Pupils: Pupils are equal, round, and reactive to light.  Neck:     Vascular: No carotid bruit or JVD.  Cardiovascular:     Rate and Rhythm: Normal rate and regular rhythm.     Heart sounds: Normal heart sounds. No murmur.  Pulmonary:     Effort: Pulmonary effort is normal.     Breath sounds: Normal breath sounds. No rales.  Skin:    General: Skin is warm and dry.  Neurological:     Mental Status: He is alert and oriented to person, place, and time.      Assessment & Plan:  Dylan Abbott is a 54 y.o. male . Type 2 diabetes mellitus with hyperglycemia, without long-term current use of insulin (HCC) - Plan: HM Diabetes Foot Exam, POCT glycosylated hemoglobin (Hb A1C), POCT glucose (manual entry), Microalbumin/Creatinine Ratio, Urine, glipiZIDE (GLUCOTROL XL) 10 MG 24 hr tablet, metFORMIN (GLUCOPHAGE) 850 MG tablet, pioglitazone (ACTOS) 15 MG tablet, Ambulatory referral to Ophthalmology, CANCELED: Microalbumin / creatinine urine ratio  - check labs, has been out of Actos past few weeks, still elevated A1c. Increase metformin to 867m BID, no other changes for now.   Essential hypertension - Plan: NIFEdipine (PROCARDIA XL) 90 MG 24 hr tablet  - restart procardia,  Hyperlipidemia, unspecified hyperlipidemia type - Plan: Comprehensive metabolic panel, Lipid Panel, simvastatin (ZOCOR) 40 MG tablet  Screen for colon cancer - Plan: Cologuard  Screening options with colonoscopy versus Cologuard discussed. Discussed timing of repeat testing intervals  if normal, as well as potential need for diagnostic Colonoscopy if positive Cologuard. Understanding expressed, and chose Cologuard.   Allergic rhinitis, unspecified seasonality, unspecified trigger - Plan: fluticasone (FLONASE) 50 MCG/ACT nasal spray  - start flonase, option of otc antihistamine.   Meds ordered this encounter  Medications  . glipiZIDE (GLUCOTROL XL) 10 MG 24 hr tablet    Sig: TAKE 1 TABLET(10 MG) BY MOUTH TWICE DAILY    Dispense:  180 tablet    Refill:  1  . metFORMIN (GLUCOPHAGE) 850 MG tablet    Sig: TAKE 1 TABLET(500 MG) BY MOUTH DAILY WITH BREAKFAST    Dispense:  180 tablet    Refill:  1  . NIFEdipine (PROCARDIA XL) 90 MG 24 hr tablet    Sig: Take 1 tablet (90 mg total) by mouth daily.    Dispense:  90 tablet    Refill:  1  . pioglitazone (ACTOS) 15 MG tablet    Sig: TAKE 1 TABLET(15 MG) BY MOUTH DAILY    Dispense:  90 tablet    Refill:  1  . simvastatin (ZOCOR) 40 MG tablet    Sig: Take 1 tablet (40 mg total) by mouth daily at 6 PM.    Dispense:  90 tablet    Refill:  1  . fluticasone (FLONASE) 50 MCG/ACT nasal spray    Sig: Place 2 sprays into both nostrils daily.    Dispense:  16 g    Refill:  6   Patient Instructions     Increase Metformin to 850 mg twice per day.  Continue other medications same dose for now.  Follow-up in the next 3 weeks to recheck blood pressure on medication.  Depending on labs today may need to add another medicine.  Check your blood sugars once per day and bring those records to next visit as well.  Fluticasone nasal spray for allergies, can also take Allegra or Claritin over-the-counter without medicine if needed.  Cologuard was ordered for colon cancer screening.    If you have lab work done today you will be contacted with your lab results within the next 2 weeks.  If you have not heard from [place] then please contact us. The fastest way to get your results is to register for My Chart.   IF you received an x-ray today,  you will receive an invoice from [facility] Radiology. Please contact [facility] Radiology at [phone] with questions or concerns regarding your invoice.   IF you received labwork today, you will receive an invoice from [place]. Please contact [facility] at [phone] with questions or concerns regarding your invoice.   Our billing staff will not be able to assist you with questions regarding bills from these companies.  You will be contacted with the lab results as soon as they are available. The fastest way to get your results is to activate your My Chart account. Instructions are located on the last page of this paperwork. If you have not heard from [place] regarding the results in 2 weeks, please contact this office.          Signed, Merri Ray, MD [facility]

## 2020-04-24 NOTE — Patient Instructions (Addendum)
   Increase Metformin to 850 mg twice per day.  Continue other medications same dose for now.  Follow-up in the next 3 weeks to recheck blood pressure on medication.  Depending on labs today may need to add another medicine.  Check your blood sugars once per day and bring those records to next visit as well.  Fluticasone nasal spray for allergies, can also take Allegra or Claritin over-the-counter without medicine if needed.  Cologuard was ordered for colon cancer screening.    If you have lab work done today you will be contacted with your lab results within the next 2 weeks.  If you have not heard from [place] then please contact us. The fastest way to get your results is to register for My Chart.   IF you received an x-ray today, you will receive an invoice from [facility]. Please contact [facility] at [phone] with questions or concerns regarding your invoice.   IF you received labwork today, you will receive an invoice from [facility]. Please contact [facility] at [phone] with questions or concerns regarding your invoice.   Our billing staff will not be able to assist you with questions regarding bills from these companies.  You will be contacted with the lab results as soon as they are available. The fastest way to get your results is to activate your My Chart account. Instructions are located on the last page of this paperwork. If you have not heard from [place] regarding the results in 2 weeks, please contact this office.

## 2020-04-25 LAB — COMPREHENSIVE METABOLIC PANEL
ALT: 16 IU/L (ref 0–44)
AST: 25 IU/L (ref 0–40)
Albumin/Globulin Ratio: 1.7 (ref 1.2–2.2)
Albumin: 4.5 g/dL (ref 3.8–4.9)
Alkaline Phosphatase: 60 IU/L (ref 39–117)
BUN/Creatinine Ratio: 17 (ref 9–20)
BUN: 21 mg/dL (ref 6–24)
Bilirubin Total: 0.5 mg/dL (ref 0.0–1.2)
CO2: 26 mmol/L (ref 20–29)
Calcium: 9.4 mg/dL (ref 8.7–10.2)
Chloride: 101 mmol/L (ref 96–106)
Creatinine, Ser: 1.26 mg/dL (ref 0.76–1.27)
GFR calc Af Amer: 74 mL/min/{1.73_m2} (ref >59)
GFR calc non Af Amer: 64 mL/min/{1.73_m2} (ref >59)
Globulin, Total: 2.7 g/dL (ref 1.5–4.5)
Glucose: 135 mg/dL — ABNORMAL HIGH (ref 65–99)
Potassium: 3.4 mmol/L — ABNORMAL LOW (ref 3.5–5.2)
Sodium: 142 mmol/L (ref 134–144)
Total Protein: 7.2 g/dL (ref 6.0–8.5)

## 2020-04-25 LAB — LIPID PANEL
Chol/HDL Ratio: 4.4 ratio (ref 0.0–5.0)
Cholesterol, Total: 222 mg/dL — ABNORMAL HIGH (ref 100–199)
HDL: 50 mg/dL (ref >39)
LDL Chol Calc (NIH): 148 mg/dL — ABNORMAL HIGH (ref 0–99)
Triglycerides: 132 mg/dL (ref 0–149)
VLDL Cholesterol Cal: 24 mg/dL (ref 5–40)

## 2020-04-25 LAB — MICROALBUMIN / CREATININE URINE RATIO
Creatinine, Urine: 357.9 mg/dL
Microalb/Creat Ratio: 15 mg/g creat (ref 0–29)
Microalbumin, Urine: 54 ug/mL

## 2020-04-26 ENCOUNTER — Encounter: Payer: Self-pay | Admitting: Family Medicine

## 2020-05-05 ENCOUNTER — Encounter: Payer: Self-pay | Admitting: Radiology

## 2020-05-15 ENCOUNTER — Ambulatory Visit: Payer: PRIVATE HEALTH INSURANCE | Admitting: Family Medicine

## 2020-05-21 ENCOUNTER — Ambulatory Visit: Payer: PRIVATE HEALTH INSURANCE | Admitting: Family Medicine

## 2020-05-28 ENCOUNTER — Ambulatory Visit (INDEPENDENT_AMBULATORY_CARE_PROVIDER_SITE_OTHER): Payer: PRIVATE HEALTH INSURANCE | Admitting: Family Medicine

## 2020-05-28 ENCOUNTER — Other Ambulatory Visit: Payer: Self-pay

## 2020-05-28 ENCOUNTER — Encounter: Payer: Self-pay | Admitting: Family Medicine

## 2020-05-28 VITALS — BP 110/65 | HR 63 | Temp 98.0°F | Ht 73.0 in | Wt 238.8 lb

## 2020-05-28 DIAGNOSIS — E876 Hypokalemia: Secondary | ICD-10-CM | POA: Diagnosis not present

## 2020-05-28 DIAGNOSIS — I10 Essential (primary) hypertension: Secondary | ICD-10-CM | POA: Diagnosis not present

## 2020-05-28 DIAGNOSIS — E785 Hyperlipidemia, unspecified: Secondary | ICD-10-CM

## 2020-05-28 DIAGNOSIS — M25512 Pain in left shoulder: Secondary | ICD-10-CM

## 2020-05-28 DIAGNOSIS — E1165 Type 2 diabetes mellitus with hyperglycemia: Secondary | ICD-10-CM

## 2020-05-28 MED ORDER — METFORMIN HCL 850 MG PO TABS: 850.0000 mg | ORAL_TABLET | Freq: Two times a day (BID) | ORAL | 1 refills | Status: DC

## 2020-05-28 MED ORDER — SIMVASTATIN 80 MG PO TABS: 80.0000 mg | ORAL_TABLET | Freq: Every day | ORAL | 1 refills | Status: DC

## 2020-05-28 NOTE — Patient Instructions (Addendum)
If you have lab work done today you will be contacted with your lab results within the next 2 weeks.  If you have not heard from [place] then please contact us. The fastest way to get your results is to register for My Chart.   IF you received an x-ray today, you will receive an invoice from [facility]. Please contact [facility] at [phone] with questions or concerns regarding your invoice.   IF you received labwork today, you will receive an invoice from [place]. Please contact [facility] at [phone] with questions or concerns regarding your invoice.   Our billing staff will not be able to assist you with questions regarding bills from these companies.  You will be contacted with the lab results as soon as they are available. The fastest way to get your results is to activate your My Chart account. Instructions are located on the last page of this paperwork. If you have not heard from [place] regarding the results in 2 weeks, please contact this office.     Increase simvastatin to 80mg ,  Metformin 850mg  - take twice per day.  I will recheck potassium level.  Recheck in 6 weeks. Tylenol if needed for shoulder, try to avoid heavy or overhead lifting on that side next few weeks. recheck if not improving.  Return to the clinic or go to the nearest emergency room if any of your symptoms worsen or new symptoms occur.   Rotator Cuff Tendinitis  Rotator cuff tendinitis is inflammation of the tough, cord-like bands that connect muscle to bone (tendons) in the rotator cuff. The rotator cuff includes all of the muscles and tendons that connect the arm to the shoulder. The rotator cuff holds the head of the upper arm bone (humerus) in the cup (fossa) of the shoulder blade (scapula). This condition can lead to a long-lasting (chronic) tear. The tear may be partial or complete. What are the causes? This condition is usually caused by overusing the rotator cuff. What increases  the risk? This condition is more likely to develop in athletes and workers who frequently use their shoulder or reach over their heads. This can include activities such as:  Tennis.  Baseball or softball.  Swimming.  Construction work.  Painting. What are the signs or symptoms? Symptoms of this condition include:  Pain spreading (radiating) from the shoulder to the upper arm.  Swelling and tenderness in front of the shoulder.  Pain when reaching, pulling, or lifting the arm above the head.  Pain when lowering the arm from above the head.  Minor pain in the shoulder when resting.  Increased pain in the shoulder at night.  Difficulty placing the arm behind the back. How is this diagnosed? This condition is diagnosed with a medical history and physical exam. Tests may also be done, including:  X-rays.  MRI.  Ultrasounds.  CT or MR arthrogram. During this test, a contrast material is injected and then images are taken. How is this treated? Treatment for this condition depends on the severity of the condition. In less severe cases, treatment may include:  Rest. This may be done with a sling that holds the shoulder still (immobilization). Your health care provider may also recommend avoiding activities that involve lifting your arm over your head.  Icing the shoulder.  Anti-inflammatory medicines, such as aspirin or ibuprofen. In more severe cases, treatment may include:  Physical therapy.  Steroid injections.  Surgery. Follow these instructions at home: If you have a  sling:  Wear the sling as told by your health care provider. Remove it only as told by your health care provider.  Loosen the sling if your fingers tingle, become numb, or turn cold and blue.  Keep the sling clean.  If the sling is not waterproof, do not let it get wet. Remove it, if allowed, or cover it with a watertight covering when you take a bath or shower. Managing pain, stiffness, and  swelling  If directed, put ice on the injured area. ? If you have a removable sling, remove it as told by your health care provider. ? Put ice in a plastic bag. ? Place a towel between your skin and the bag. ? Leave the ice on for 20 minutes, 2-3 times a day.  Move your fingers often to avoid stiffness and to lessen swelling.  Raise (elevate) the injured area above the level of your heart while you are lying down.  Find a comfortable sleeping position or sleep on a recliner, if available. Driving  Do not drive or use heavy machinery while taking prescription pain medicine.  Ask your health care provider when it is safe to drive if you have a sling on your arm. Activity  Rest your shoulder as told by your health care provider.  Return to your normal activities as told by your health care provider. Ask your health care provider what activities are safe for you.  Do any exercises or stretches as told by your health care provider.  If you do repetitive overhead tasks, take small breaks in between and include stretching exercises as told by your health care provider. General instructions  Do not use any products that contain nicotine or tobacco, such as cigarettes and e-cigarettes. These can delay healing. If you need help quitting, ask your health care provider.  Take over-the-counter and prescription medicines only as told by your health care provider.  Keep all follow-up visits as told by your health care provider. This is important. Contact a health care provider if:  Your pain gets worse.  You have new pain in your arm, hands, or fingers.  Your pain is not relieved with medicine or does not get better after 6 weeks of treatment.  You have cracking sensations when moving your shoulder in certain directions.  You hear a snapping sound after using your shoulder, followed by severe pain and weakness. Get help right away if:  Your arm, hand, or fingers are numb or  tingling.  Your arm, hand, or fingers are swollen or painful or they turn white or blue. Summary  Rotator cuff tendinitis is inflammation of the tough, cord-like bands that connect muscle to bone (tendons) in the rotator cuff.  This condition is usually caused by overusing the rotator cuff, which includes all of the muscles and tendons that connect the arm to the shoulder.  This condition is more likely to develop in athletes and workers who frequently use their shoulder or reach over their heads.  Treatment generally includes rest, anti-inflammatory medicines, and icing. In some cases, physical therapy and steroid injections may be needed. In severe cases, surgery may be needed. This information is not intended to replace advice given to you by your health care provider. Make sure you discuss any questions you have with your health care provider. Document Revised: 03/29/2019 Document Reviewed: 11/21/2016 Elsevier Patient Education  [place].  Shoulder Pain Many things can cause shoulder pain, including:  An injury to the shoulder.  Overuse of  the shoulder.  Arthritis. The source of the pain can be:  Inflammation.  An injury to the shoulder joint.  An injury to a tendon, ligament, or bone. Follow these instructions at home: Pay attention to changes in your symptoms. Let your health care provider know about them. Follow these instructions to relieve your pain. If you have a sling:  Wear the sling as told by your health care provider. Remove it only as told by your health care provider.  Loosen the sling if your fingers tingle, become numb, or turn cold and blue.  Keep the sling clean.  If the sling is not waterproof: ? Do not let it get wet. Remove it to shower or bathe.  Move your arm as little as possible, but keep your hand moving to prevent swelling. Managing pain, stiffness, and swelling   If directed, put ice on the painful area: ? Put ice in a plastic  bag. ? Place a towel between your skin and the bag. ? Leave the ice on for 20 minutes, 2-3 times per day. Stop applying ice if it does not help with the pain.  Squeeze a soft ball or a foam pad as much as possible. This helps to keep the shoulder from swelling. It also helps to strengthen the arm. General instructions  Take over-the-counter and prescription medicines only as told by your health care provider.  Keep all follow-up visits as told by your health care provider. This is important. Contact a health care provider if:  Your pain gets worse.  Your pain is not relieved with medicines.  New pain develops in your arm, hand, or fingers. Get help right away if:  Your arm, hand, or fingers: ? Tingle. ? Become numb. ? Become swollen. ? Become painful. ? Turn white or blue. Summary  Shoulder pain can be caused by an injury, overuse, or arthritis.  Pay attention to changes in your symptoms. Let your health care provider know about them.  This condition may be treated with a sling, ice, and pain medicines.  Contact your health care provider if the pain gets worse or new pain develops. Get help right away if your arm, hand, or fingers tingle or become numb, swollen, or painful.  Keep all follow-up visits as told by your health care provider. This is important. This information is not intended to replace advice given to you by your health care provider. Make sure you discuss any questions you have with your health care provider. Document Revised: 06/19/2018 Document Reviewed: 06/19/2018 Elsevier Patient Education  2020 [employer].

## 2020-05-28 NOTE — Progress Notes (Signed)
Subjective:  Patient ID: Dylan Abbott, male    DOB: 1966/08/19  Age: 54 y.o. MRN: [id number]  CC:  Chief Complaint  Patient presents with  . Medical Management of Chronic Issues    f/u on hypertension and diabetes. Patient stated he only been taking metformin once a day as the bottle stated. Patient was informed he was supposed to be taking twice a day.  . Shoulder Pain    left shoulder pain x 1 month    HPI Dylan Abbott presents for   Chronic medication follow-up as well as acute concern of shoulder pain as above  Diabetes:  Uncontrolled with hyperglycemia.  Advised to increase Metformin to 850 mg twice per day at May 7 visit.  Previously off Actos for 2 weeks, was continuing to take glipizide 10 mg twice daily.  Was only on Metformin 500 mg twice daily prior. Taking actos daily.  Glipizide 23m qd.  Metformin - only taking 8578mQD (dosing on rx said once per day) Home readings: last week - 140.  No symptomatic lows.  Microalbumin: Normal ratio 04/24/2020  Lab Results  Component Value Date   HGBA1C 9.0 (A) 04/24/2020   HGBA1C 9.7 (H) 11/01/2019   HGBA1C 8.1 (H) 07/25/2019   Lab Results  Component Value Date   LDLCALC 148 (H) 04/24/2020   CREATININE 1.26 04/24/2020   Hypokalemia: Noted on most recent labs.  Potassium rich foods recommended with repeat today.no new foods.   Lab Results  Component Value Date   NA 142 04/24/2020   K 3.4 (L) 04/24/2020   CL 101 04/24/2020   CO2 26 04/24/2020    Hyperlipidemia: Uncontrolled at May office visit.  Should be on Zocor 40 mg daily. Was not missing any doses.  Lab Results  Component Value Date   CHOL 222 (H) 04/24/2020   HDL 50 04/24/2020   LDLCALC 148 (H) 04/24/2020   TRIG 132 04/24/2020   CHOLHDL 4.4 04/24/2020   Lab Results  Component Value Date   ALT 16 04/24/2020   AST 25 04/24/2020   ALKPHOS 60 04/24/2020   BILITOT 0.5 04/24/2020   Left shoulder pain No fall, no injury.  Top of shoulder. No  limitations in movement.  R hand dominant. No prior injury.  Pain past month. Sore to sleep on that area. Same.  No overhead work, but sometimes has to grab heavy items at work.  Tx: none.    Hypertension: Back on nifedipine.  Home readings:none.  No new side effects or lightheadedness.  BP Readings from Last 3 Encounters:  05/28/20 110/65  04/24/20 (!) 157/88  11/20/19 136/87   Lab Results  Component Value Date   CREATININE 1.26 04/24/2020     History Patient Active Problem List   Diagnosis Date Noted  . COVID-19 virus infection 10/31/2019  . Abnormal liver function 10/31/2019  . Hypokalemia 10/31/2019  . Hyponatremia 10/31/2019  . Diabetes [place] ([place]  . Hyperlipidemia 11/13/2017  . Hypertensive disorder 11/13/2017   Past Medical History:  Diagnosis Date  . Diabetes mellitus without complication ([facility]  . Hyperlipidemia   . Hypertension    No past surgical history on file. No Known Allergies Prior to Admission medications   Medication Sig Start Date End Date Taking? Authorizing Provider  Accu-Chek FastClix Lancets MISC USE TO CHECK BLOOD SUGAR EVERY DAY 01/24/20  Yes GrWendie AgresteMD  ACCU-CHEK GUIDE test strip USE TO CHECK BLOOD SUGAR ONCE A DAY 10/25/19  Yes GrWendie AgresteMD  blood glucose meter kit and supplies Dispense based on patient and insurance preference. Once per day - fasting or 2 hrs after meal  Dx: E11.65. 07/25/19  Yes [name], MD  glipiZIDE (GLUCOTROL XL) 10 MG 24 hr tablet TAKE 1 TABLET(10 MG) BY MOUTH TWICE DAILY 04/24/20  Yes [name], MD  metFORMIN (GLUCOPHAGE) 850 MG tablet TAKE 1 TABLET(500 MG) BY MOUTH DAILY WITH BREAKFAST 04/24/20  Yes [name], MD  NIFEdipine (PROCARDIA XL) 90 MG 24 hr tablet Take 1 tablet (90 mg total) by mouth daily. 04/24/20  Yes [name], MD  pioglitazone (ACTOS) 15 MG tablet TAKE 1 TABLET(15 MG) BY MOUTH DAILY 04/24/20  Yes [name], MD  simvastatin (ZOCOR) 40  MG tablet Take 1 tablet (40 mg total) by mouth daily at 6 PM. 04/24/20  Yes [name], MD   Social History   Socioeconomic History  . Marital status: Married    Spouse name: Not on file  . Number of children: 2  . Years of education: Not on file  . Highest education level: Not on file  Occupational History  . Occupation: environmental  Tobacco Use  . Smoking status: Never Smoker  . Smokeless tobacco: Never Used  Substance and Sexual Activity  . Alcohol use: Never    Alcohol/week: 0.0 standard drinks  . Drug use: Never  . Sexual activity: Yes  Other Topics Concern  . Not on file  Social History Narrative  . Not on file   Social Determinants of Health   Financial Resource Strain:   . Difficulty of Paying Living Expenses:   Food Insecurity:   . Worried About Charity fundraiser in the Last Year:   . Arboriculturist in the Last Year:   Transportation Needs:   . Film/video editor (Medical):   [name] Lack of Transportation (Non-Medical):   Physical Activity:   . Days of Exercise per Week:   . Minutes of Exercise per Session:   Stress:   . Feeling of Stress :   Social Connections:   . Frequency of Communication with Friends and Family:   . Frequency of Social Gatherings with Friends and Family:   . Attends Religious Services:   . Active Member of Clubs or Organizations:   . Attends Archivist Meetings:   [name] Marital Status:   Intimate Partner Violence:   . Fear of Current or Ex-Partner:   . Emotionally Abused:   [name] Physically Abused:   . Sexually Abused:     Review of Systems  Constitutional: Negative for fatigue and unexpected weight change.  Eyes: Negative for visual disturbance.  Respiratory: Negative for cough, chest tightness and shortness of breath.   Cardiovascular: Negative for chest pain, palpitations and leg swelling.  Gastrointestinal: Negative for abdominal pain and blood in stool.  Neurological: Negative for dizziness, light-headedness  and headaches.    Objective:   Vitals:   05/28/20 1632  BP: 110/65  Pulse: 63  Temp: 98 F (36.7 C)  TempSrc: Temporal  SpO2: 97%  Weight: 238 lb 12.8 oz (108.3 kg)  Height: 6' 1"  (1.854 m)     Physical Exam Vitals reviewed.  Constitutional:      Appearance: He is well-developed.  HENT:     Head: Normocephalic and atraumatic.  Eyes:     Pupils: Pupils are equal, round, and reactive to light.  Neck:     Vascular: No carotid bruit or JVD.  Cardiovascular:  Rate and Rhythm: Normal rate and regular rhythm.     Heart sounds: Normal heart sounds. No murmur heard.   Pulmonary:     Effort: Pulmonary effort is normal.     Breath sounds: Normal breath sounds. No rales.  Musculoskeletal:     Comments: C-spine pain-free range of motion, does not reproduce shoulder pain.  Left shoulder [place]/AC/clavicle nontender, no focal bony tenderness.  Biceps tendon nontender.  Full range of motion.  Skin intact.  Slight discomfort with empty can without weakness, otherwise rotator cuff testing full strength pain-free.  Negative Neer, [place], negative crossover  Skin:    General: Skin is warm and dry.  Neurological:     Mental Status: He is alert and oriented to person, place, and time.        Assessment & Plan:  Dylan Abbott is a 54 y.o. male . Hypokalemia - Plan: Basic metabolic panel  - repeat testing. Potassium rich foods discussed.   Type 2 diabetes mellitus with hyperglycemia, without long-term current use of insulin (HCC) - Plan: metFORMIN (GLUCOPHAGE) 850 MG tablet  - clarified dosing of metformin at 859m BID. Continue actos and glipizide same doses and recheck in 6 weeks.   Hyperlipidemia, unspecified hyperlipidemia type - Plan: simvastatin (ZOCOR) 80 MG tablet  - decreased control, will incr zocor to 855mqd, recheck labs in 6 weeks.   Essential hypertension  - improved control back on procardia.   Acute pain of left shoulder  - suspect component of supraspinatus  tendinosis. Activity modification discussed. Tylenol otc, RTC precautions. Consider imaging and ortho eval if persistent - XR deferred without injury.   Meds ordered this encounter  Medications  . metFORMIN (GLUCOPHAGE) 850 MG tablet    Sig: Take 1 tablet (850 mg total) by mouth 2 (two) times daily with a meal. TAKE 1 TABLET(500 MG) BY MOUTH DAILY WITH BREAKFAST    Dispense:  180 tablet    Refill:  1  . simvastatin (ZOCOR) 80 MG tablet    Sig: Take 1 tablet (80 mg total) by mouth daily at 6 PM.    Dispense:  90 tablet    Refill:  1   Patient Instructions       If you have lab work done today you will be contacted with your lab results within the next 2 weeks.  If you have not heard from us[place] please contact usKoreaThe fastest way to get your results is to register for My Chart.   IF you received an x-ray today, you will receive an invoice from [facility]. Please contact [facility] at [phone] questions or concerns regarding your invoice.   IF you received labwork today, you will receive an invoice from [facility] at [phone] questions or concerns regarding your invoice.   Our billing staff will not be able to assist you with questions regarding bills from these companies.  You will be contacted with the lab results as soon as they are available. The fastest way to get your results is to activate your My Chart account. Instructions are located on the last page of this paperwork. If you have not heard from usKoreaegarding the results in 2 weeks, please contact this office.     Increase simvastatin to 8081m Metformin 850m50mtake twice per day.  I will recheck potassium level.  Recheck in 6 weeks. Tylenol if needed for shoulder, try to avoid heavy or overhead lifting on that side next few  weeks. recheck if not improving.  Return to the clinic or go to the nearest emergency room if any of your symptoms worsen or new symptoms  occur.   Rotator Cuff Tendinitis  Rotator cuff tendinitis is inflammation of the tough, cord-like bands that connect muscle to bone (tendons) in the rotator cuff. The rotator cuff includes all of the muscles and tendons that connect the arm to the shoulder. The rotator cuff holds the head of the upper arm bone (humerus) in the cup (fossa) of the shoulder blade (scapula). This condition can lead to a long-lasting (chronic) tear. The tear may be partial or complete. What are the causes? This condition is usually caused by overusing the rotator cuff. What increases the risk? This condition is more likely to develop in athletes and workers who frequently use their shoulder or reach over their heads. This can include activities such as:  Tennis.  Baseball or softball.  Swimming.  Construction work.  Painting. What are the signs or symptoms? Symptoms of this condition include:  Pain spreading (radiating) from the shoulder to the upper arm.  Swelling and tenderness in front of the shoulder.  Pain when reaching, pulling, or lifting the arm above the head.  Pain when lowering the arm from above the head.  Minor pain in the shoulder when resting.  Increased pain in the shoulder at night.  Difficulty placing the arm behind the back. How is this diagnosed? This condition is diagnosed with a medical history and physical exam. Tests may also be done, including:  X-rays.  MRI.  Ultrasounds.  CT or MR arthrogram. During this test, a contrast material is injected and then images are taken. How is this treated? Treatment for this condition depends on the severity of the condition. In less severe cases, treatment may include:  Rest. This may be done with a sling that holds the shoulder still (immobilization). Your health care provider may also recommend avoiding activities that involve lifting your arm over your head.  Icing the shoulder.  Anti-inflammatory medicines, such as  aspirin or ibuprofen. In more severe cases, treatment may include:  Physical therapy.  Steroid injections.  Surgery. Follow these instructions at home: If you have a sling:  Wear the sling as told by your health care provider. Remove it only as told by your health care provider.  Loosen the sling if your fingers tingle, become numb, or turn cold and blue.  Keep the sling clean.  If the sling is not waterproof, do not let it get wet. Remove it, if allowed, or cover it with a watertight covering when you take a bath or shower. Managing pain, stiffness, and swelling  If directed, put ice on the injured area. ? If you have a removable sling, remove it as told by your health care provider. ? Put ice in a plastic bag. ? Place a towel between your skin and the bag. ? Leave the ice on for 20 minutes, 2-3 times a day.  Move your fingers often to avoid stiffness and to lessen swelling.  Raise (elevate) the injured area above the level of your heart while you are lying down.  Find a comfortable sleeping position or sleep on a recliner, if available. Driving  Do not drive or use heavy machinery while taking prescription pain medicine.  Ask your health care provider when it is safe to drive if you have a sling on your arm. Activity  Rest your shoulder as told by your health care provider.  Return to your normal activities as told by your health care provider. Ask your health care provider what activities are safe for you.  Do any exercises or stretches as told by your health care provider.  If you do repetitive overhead tasks, take small breaks in between and include stretching exercises as told by your health care provider. General instructions  Do not use any products that contain nicotine or tobacco, such as cigarettes and e-cigarettes. These can delay healing. If you need help quitting, ask your health care provider.  Take over-the-counter and prescription medicines only as  told by your health care provider.  Keep all follow-up visits as told by your health care provider. This is important. Contact a health care provider if:  Your pain gets worse.  You have new pain in your arm, hands, or fingers.  Your pain is not relieved with medicine or does not get better after 6 weeks of treatment.  You have cracking sensations when moving your shoulder in certain directions.  You hear a snapping sound after using your shoulder, followed by severe pain and weakness. Get help right away if:  Your arm, hand, or fingers are numb or tingling.  Your arm, hand, or fingers are swollen or painful or they turn white or blue. Summary  Rotator cuff tendinitis is inflammation of the tough, cord-like bands that connect muscle to bone (tendons) in the rotator cuff.  This condition is usually caused by overusing the rotator cuff, which includes all of the muscles and tendons that connect the arm to the shoulder.  This condition is more likely to develop in athletes and workers who frequently use their shoulder or reach over their heads.  Treatment generally includes rest, anti-inflammatory medicines, and icing. In some cases, physical therapy and steroid injections may be needed. In severe cases, surgery may be needed. This information is not intended to replace advice given to you by your health care provider. Make sure you discuss any questions you have with your health care provider. Document Revised: 03/29/2019 Document Reviewed: 11/21/2016 Elsevier Patient Education  [place].  Shoulder Pain Many things can cause shoulder pain, including:  An injury to the shoulder.  Overuse of the shoulder.  Arthritis. The source of the pain can be:  Inflammation.  An injury to the shoulder joint.  An injury to a tendon, ligament, or bone. Follow these instructions at home: Pay attention to changes in your symptoms. Let your health care provider know about them.  Follow these instructions to relieve your pain. If you have a sling:  Wear the sling as told by your health care provider. Remove it only as told by your health care provider.  Loosen the sling if your fingers tingle, become numb, or turn cold and blue.  Keep the sling clean.  If the sling is not waterproof: ? Do not let it get wet. Remove it to shower or bathe.  Move your arm as little as possible, but keep your hand moving to prevent swelling. Managing pain, stiffness, and swelling   If directed, put ice on the painful area: ? Put ice in a plastic bag. ? Place a towel between your skin and the bag. ? Leave the ice on for 20 minutes, 2-3 times per day. Stop applying ice if it does not help with the pain.  Squeeze a soft ball or a foam pad as much as possible. This helps to keep the shoulder from swelling. It also helps to strengthen the arm.  General instructions  Take over-the-counter and prescription medicines only as told by your health care provider.  Keep all follow-up visits as told by your health care provider. This is important. Contact a health care provider if:  Your pain gets worse.  Your pain is not relieved with medicines.  New pain develops in your arm, hand, or fingers. Get help right away if:  Your arm, hand, or fingers: ? Tingle. ? Become numb. ? Become swollen. ? Become painful. ? Turn white or blue. Summary  Shoulder pain can be caused by an injury, overuse, or arthritis.  Pay attention to changes in your symptoms. Let your health care provider know about them.  This condition may be treated with a sling, ice, and pain medicines.  Contact your health care provider if the pain gets worse or new pain develops. Get help right away if your arm, hand, or fingers tingle or become numb, swollen, or painful.  Keep all follow-up visits as told by your health care provider. This is important. This information is not intended to replace advice given to you  by your health care provider. Make sure you discuss any questions you have with your health care provider. Document Revised: 06/19/2018 Document Reviewed: 06/19/2018 Elsevier Patient Education  2020 [employer].      Signed, Merri Ray, MD [facility]

## 2020-05-29 LAB — BASIC METABOLIC PANEL WITH GFR
BUN/Creatinine Ratio: 17 (ref 9–20)
BUN: 31 mg/dL — ABNORMAL HIGH (ref 6–24)
CO2: 24 mmol/L (ref 20–29)
Calcium: 9.4 mg/dL (ref 8.7–10.2)
Chloride: 98 mmol/L (ref 96–106)
Creatinine, Ser: 1.84 mg/dL — ABNORMAL HIGH (ref 0.76–1.27)
GFR calc Af Amer: 47 mL/min/{1.73_m2} — ABNORMAL LOW
GFR calc non Af Amer: 41 mL/min/{1.73_m2} — ABNORMAL LOW
Glucose: 176 mg/dL — ABNORMAL HIGH (ref 65–99)
Potassium: 3.2 mmol/L — ABNORMAL LOW (ref 3.5–5.2)
Sodium: 140 mmol/L (ref 134–144)

## 2020-06-02 ENCOUNTER — Other Ambulatory Visit: Payer: Self-pay | Admitting: Family Medicine

## 2020-06-02 DIAGNOSIS — E876 Hypokalemia: Secondary | ICD-10-CM

## 2020-06-02 MED ORDER — POTASSIUM CHLORIDE CRYS ER 20 MEQ PO TBCR: 20.0000 meq | EXTENDED_RELEASE_TABLET | Freq: Every day | ORAL | 1 refills | Status: DC

## 2020-06-02 NOTE — Progress Notes (Signed)
See lab for results -  hypokalemia, plan for repeat testing in 1 week, start potassium chloride 20 mEq daily for now.

## 2020-06-29 ENCOUNTER — Encounter: Payer: Self-pay | Admitting: Family Medicine

## 2020-06-29 NOTE — Progress Notes (Signed)
Sent letter to pt regarding their cologurd kit.

## 2020-07-09 ENCOUNTER — Other Ambulatory Visit: Payer: Self-pay

## 2020-07-09 ENCOUNTER — Ambulatory Visit (INDEPENDENT_AMBULATORY_CARE_PROVIDER_SITE_OTHER): Payer: PRIVATE HEALTH INSURANCE | Admitting: Family Medicine

## 2020-07-09 ENCOUNTER — Ambulatory Visit (INDEPENDENT_AMBULATORY_CARE_PROVIDER_SITE_OTHER): Payer: PRIVATE HEALTH INSURANCE

## 2020-07-09 ENCOUNTER — Encounter: Payer: Self-pay | Admitting: Family Medicine

## 2020-07-09 VITALS — BP 130/78 | HR 79 | Temp 98.3°F | Ht 73.0 in | Wt 239.0 lb

## 2020-07-09 DIAGNOSIS — E1165 Type 2 diabetes mellitus with hyperglycemia: Secondary | ICD-10-CM | POA: Diagnosis not present

## 2020-07-09 DIAGNOSIS — E785 Hyperlipidemia, unspecified: Secondary | ICD-10-CM | POA: Diagnosis not present

## 2020-07-09 DIAGNOSIS — E876 Hypokalemia: Secondary | ICD-10-CM | POA: Diagnosis not present

## 2020-07-09 DIAGNOSIS — M25512 Pain in left shoulder: Secondary | ICD-10-CM | POA: Diagnosis not present

## 2020-07-09 MED ORDER — METFORMIN HCL 850 MG PO TABS: 850.0000 mg | ORAL_TABLET | Freq: Two times a day (BID) | ORAL | 1 refills | Status: DC

## 2020-07-09 MED ORDER — POTASSIUM CHLORIDE CRYS ER 20 MEQ PO TBCR: 20.0000 meq | EXTENDED_RELEASE_TABLET | Freq: Every day | ORAL | 1 refills | Status: DC

## 2020-07-09 NOTE — Progress Notes (Signed)
Subjective:  Patient ID: Dylan Abbott, male    DOB: 01/04/1966  Age: 54 y.o. MRN: [id number]  CC:  Chief Complaint  Patient presents with  . Follow-up    on diabetes and hyperlipidemia. Pt reports no issues with his diabetes since last OV.  pt dosen't check his BS. PT states he has been watching his diet and trying to avoid sweets. pt reports no physical symptoms of these conditions. pt reports he takes his medication as prescribed with n side effects.    HPI Dylan Abbott presents for   Diabetes: Complicated by hyperglycemia.  Uncontrolled with CKD, followed by nephrology. Elevated A1c late last year likely due to adjusted medications with elevated creatinine As well as dexamethasone during treatment of COVID-19 pneumonia. Metformin increased to 850 mg twice daily at May 7 visit.  Had been off Actos for 2 weeks at that time, continued on glipizide.  Was on Actos daily at June 10 visit, glipizide 10 mg daily.  Unfortunately some confusion regarding the Metformin dosing instructions and was taking it once per day at 850 mg.  Increased to twice per day at June 10 visit. Still on once per day metformin, and actos, glipizide both once per day.  No home readings.  Microalbumin: Normal ratio 04/24/2020 optho -ordered in May. Has not seen yet. No known call.  foot exam, pneumovax: Up-to-date Simvastatin increased to 80 mg daily at June 10 visit, plan for repeat testing today.fasting - last po 8-9 hours. No new myalgias.   Lab Results  Component Value Date   HGBA1C 9.0 (A) 04/24/2020   HGBA1C 9.7 (H) 11/01/2019   HGBA1C 8.1 (H) 07/25/2019   Lab Results  Component Value Date   LDLCALC 93 07/09/2020   CREATININE 2.23 (H) 07/09/2020   Hypokalemia Lab Results  Component Value Date   K 3.2 (L) 07/09/2020   Started on potassium June 15, potassium chloride 20 mEq daily.  Previously recommended potassium rich foods. He does not have potassium pill - no change in diet,  No  palpitations, fatigue or muscle cramps.   Elevated creatinine Creatinine increased form 1.26 on 04/24/20 to 1.84 in 05/28/20.  No nsaids. Drinking water during the day. No difficulty urinating. No recent nephrology appt.   Left shoulder pain: Still same left shoulder pain. No new meds. Possible supraspinatus tendonitis last visit.  Sore to sleep on shoulder, or internal rotation, moving arm up back.  No limitation in activity or work.  Has not tried tylenol.   History Patient Active Problem List   Diagnosis Date Noted  . COVID-19 virus infection 10/31/2019  . Abnormal liver function 10/31/2019  . Hypokalemia 10/31/2019  . Hyponatremia 10/31/2019  . Diabetes mellitus ([place]) 11/13/2017  . Hyperlipidemia 11/13/2017  . Hypertensive disorder 11/13/2017   Past Medical History:  Diagnosis Date  . Diabetes mellitus without complication ([place])   . Hyperlipidemia   . Hypertension    No past surgical history on file. No Known Allergies Prior to Admission medications   Medication Sig Start Date End Date Taking? Authorizing Provider  Accu-Chek FastClix Lancets MISC USE TO CHECK BLOOD SUGAR EVERY DAY 01/24/20  Yes [name], MD  glipiZIDE (GLUCOTROL XL) 10 MG 24 hr tablet TAKE 1 TABLET(10 MG) BY MOUTH TWICE DAILY 04/24/20  Yes [name], MD  metFORMIN (GLUCOPHAGE) 850 MG tablet Take 1 tablet (850 mg total) by mouth 2 (two) times daily with a meal. TAKE 1 TABLET(500 MG) BY MOUTH DAILY WITH BREAKFAST 05/28/20  Yes  [name], MD  NIFEdipine (PROCARDIA XL) 90 MG 24 hr tablet Take 1 tablet (90 mg total) by mouth daily. 04/24/20  Yes [name], MD  pioglitazone (ACTOS) 15 MG tablet TAKE 1 TABLET(15 MG) BY MOUTH DAILY 04/24/20  Yes [name], MD  potassium chloride SA (KLOR-CON) 20 MEQ tablet Take 1 tablet (20 mEq total) by mouth daily. 06/02/20  Yes [name], MD  simvastatin (ZOCOR) 80 MG tablet Take 1 tablet (80 mg total) by mouth daily at 6 PM. 05/28/20  Yes  [name], MD  ACCU-CHEK GUIDE test strip USE TO CHECK BLOOD SUGAR ONCE A DAY Patient not taking: Reported on 07/09/2020 10/25/19   [name], MD  blood glucose meter kit and supplies Dispense based on patient and insurance preference. Once per day - fasting or 2 hrs after meal  Dx: E11.65. Patient not taking: Reported on 07/09/2020 07/25/19   [name], MD   Social History   Socioeconomic History  . Marital status: Married    Spouse name: Not on file  . Number of children: 2  . Years of education: Not on file  . Highest education level: Not on file  Occupational History  . Occupation: environmental  Tobacco Use  . Smoking status: Never Smoker  . Smokeless tobacco: Never Used  Substance and Sexual Activity  . Alcohol use: Never    Alcohol/week: 0.0 standard drinks  . Drug use: Never  . Sexual activity: Yes  Other Topics Concern  . Not on file  Social History Narrative  . Not on file   Social Determinants of Health   Financial Resource Strain:   . Difficulty of Paying Living Expenses:   Food Insecurity:   . Worried About Charity fundraiser in the Last Year:   . Arboriculturist in the Last Year:   Transportation Needs:   . Film/video editor (Medical):   [name] Lack of Transportation (Non-Medical):   Physical Activity:   . Days of Exercise per Week:   . Minutes of Exercise per Session:   Stress:   . Feeling of Stress :   Social Connections:   . Frequency of Communication with Friends and Family:   . Frequency of Social Gatherings with Friends and Family:   . Attends Religious Services:   . Active Member of Clubs or Organizations:   . Attends Archivist Meetings:   [name] Marital Status:   Intimate Partner Violence:   . Fear of Current or Ex-Partner:   . Emotionally Abused:   [name] Physically Abused:   . Sexually Abused:     Review of Systems  Constitutional: Negative for fatigue and unexpected weight change.  Eyes: Negative for visual  disturbance.  Respiratory: Negative for cough, chest tightness and shortness of breath.   Cardiovascular: Negative for chest pain, palpitations and leg swelling.  Gastrointestinal: Negative for abdominal pain and blood in stool.  Musculoskeletal: Positive for arthralgias (left shoulder. ). Negative for myalgias (no cramps ).  Neurological: Negative for dizziness, light-headedness and headaches.     Objective:   Vitals:   07/09/20 1634  BP: (!) 130/78  Pulse: 79  Temp: 98.3 F (36.8 C)  TempSrc: Temporal  SpO2: 96%  Weight: (!) 239 lb (108.4 kg)  Height: 6' 1"  (1.854 m)     Physical Exam Vitals reviewed.  Constitutional:      Appearance: He is well-developed.  HENT:     Head: Normocephalic and  atraumatic.  Eyes:     Pupils: Pupils are equal, round, and reactive to light.  Neck:     Vascular: No carotid bruit or JVD.  Cardiovascular:     Rate and Rhythm: Normal rate and regular rhythm.     Heart sounds: Normal heart sounds. No murmur heard.   Pulmonary:     Effort: Pulmonary effort is normal.     Breath sounds: Normal breath sounds. No rales.  Musculoskeletal:     Comments: Left shoulder full range of motion, negative Neer, Hawkins, negative empty can, no rotator cuff weakness.  Some discomfort inside the shoulder with internal rotation.  C-spine nontender, no pain into shoulder arm with C-spine range of motion  [place], AC, clavicle nontender  Skin:    General: Skin is warm and dry.  Neurological:     Mental Status: He is alert and oriented to person, place, and time.   DG Shoulder Left  Result Date: 07/09/2020 CLINICAL DATA:  Left shoulder pain for the past 6 months. EXAM: LEFT SHOULDER - 2+ VIEW COMPARISON:  None. FINDINGS: No acute fracture or dislocation. Mild acromioclavicular joint space narrowing with marginal osteophytes. Bone mineralization is normal. Soft tissues are unremarkable. IMPRESSION: 1. Mild acromioclavicular osteoarthritis. Electronically Signed    By: Titus Dubin M.D.   On: 07/09/2020 17:46     Assessment & Plan:  Heyward Douthit is a 54 y.o. male . Type 2 diabetes mellitus with hyperglycemia, without long-term current use of insulin (HCC) - Plan: Comprehensive metabolic panel, metFORMIN (GLUCOPHAGE) 850 MG tablet  -Clarify dosing instructions again, continue Metformin, check labs.  Understanding expressed of plan.  Hypokalemia - Plan: Comprehensive metabolic panel, potassium chloride SA (KLOR-CON) 20 MEQ tablet  -Check labs, continue potassium supplement.  Hyperlipidemia, unspecified hyperlipidemia type - Plan: Lipid panel  -Tolerating higher dose of statin, check lipids  Left shoulder pain, unspecified chronicity - Plan: DG Shoulder Left, Ambulatory referral to Orthopedic Surgery  -Possible component of rotator cuff syndrome, but seems to be more internal symptoms as above.  AC joint nontender. refer to orthopedics.  Meds ordered this encounter  Medications  . potassium chloride SA (KLOR-CON) 20 MEQ tablet    Sig: Take 1 tablet (20 mEq total) by mouth daily.    Dispense:  30 tablet    Refill:  1  . metFORMIN (GLUCOPHAGE) 850 MG tablet    Sig: Take 1 tablet (850 mg total) by mouth 2 (two) times daily with a meal.    Dispense:  180 tablet    Refill:  1   Patient Instructions       If you have lab work done today you will be contacted with your lab results within the next 2 weeks.  If you have not heard from [place] then please contact us. The fastest way to get your results is to register for My Chart.   IF you received an x-ray today, you will receive an invoice from [facility]. Please contact [facility] at [phone] with questions or concerns regarding your invoice.   IF you received labwork today, you will receive an invoice from [facility]. Please contact [facility] at [phone] with questions or concerns regarding your invoice.   Our billing staff will not be able to assist you with questions  regarding bills from these companies.  You will be contacted with the lab results as soon as they are available. The fastest way to get your results is to activate your My Chart account. Instructions are located on  the last page of this paperwork. If you have not heard from [place] regarding the results in 2 weeks, please contact this office.         Signed, Merri Ray, MD [facility]

## 2020-07-09 NOTE — Patient Instructions (Signed)
° ° ° °  If you have lab work done today you will be contacted with your lab results within the next 2 weeks.  If you have not heard from us then please contact us. The fastest way to get your results is to register for My Chart. ° ° °IF you received an x-ray today, you will receive an invoice from [facility]. Please contact [facility] at [phone] with questions or concerns regarding your invoice.  ° °IF you received labwork today, you will receive an invoice from [facility]. Please contact [facility] at [phone] with questions or concerns regarding your invoice.  ° °Our billing staff will not be able to assist you with questions regarding bills from these companies. ° °You will be contacted with the lab results as soon as they are available. The fastest way to get your results is to activate your My Chart account. Instructions are located on the last page of this paperwork. If you have not heard from us regarding the results in 2 weeks, please contact this office. °  ° ° ° °

## 2020-07-10 LAB — COMPREHENSIVE METABOLIC PANEL
ALT: 12 IU/L (ref 0–44)
AST: 25 IU/L (ref 0–40)
Albumin/Globulin Ratio: 1.6 (ref 1.2–2.2)
Albumin: 4.5 g/dL (ref 3.8–4.9)
Alkaline Phosphatase: 56 IU/L (ref 48–121)
BUN/Creatinine Ratio: 15 (ref 9–20)
BUN: 33 mg/dL — ABNORMAL HIGH (ref 6–24)
Bilirubin Total: 0.4 mg/dL (ref 0.0–1.2)
CO2: 27 mmol/L (ref 20–29)
Calcium: 9.9 mg/dL (ref 8.7–10.2)
Chloride: 97 mmol/L (ref 96–106)
Creatinine, Ser: 2.23 mg/dL — ABNORMAL HIGH (ref 0.76–1.27)
GFR calc Af Amer: 37 mL/min/{1.73_m2} — ABNORMAL LOW (ref >59)
GFR calc non Af Amer: 32 mL/min/{1.73_m2} — ABNORMAL LOW (ref >59)
Globulin, Total: 2.9 g/dL (ref 1.5–4.5)
Glucose: 99 mg/dL (ref 65–99)
Potassium: 3.2 mmol/L — ABNORMAL LOW (ref 3.5–5.2)
Sodium: 139 mmol/L (ref 134–144)
Total Protein: 7.4 g/dL (ref 6.0–8.5)

## 2020-07-10 LAB — LIPID PANEL
Chol/HDL Ratio: 3.1 ratio (ref 0.0–5.0)
Cholesterol, Total: 159 mg/dL (ref 100–199)
HDL: 52 mg/dL (ref >39)
LDL Chol Calc (NIH): 93 mg/dL (ref 0–99)
Triglycerides: 75 mg/dL (ref 0–149)
VLDL Cholesterol Cal: 14 mg/dL (ref 5–40)

## 2020-07-14 ENCOUNTER — Encounter: Payer: Self-pay | Admitting: Family Medicine

## 2020-07-14 ENCOUNTER — Other Ambulatory Visit: Payer: Self-pay

## 2020-07-14 ENCOUNTER — Other Ambulatory Visit: Payer: Self-pay | Admitting: Family Medicine

## 2020-07-14 DIAGNOSIS — E876 Hypokalemia: Secondary | ICD-10-CM

## 2020-07-14 DIAGNOSIS — R7989 Other specified abnormal findings of blood chemistry: Secondary | ICD-10-CM

## 2020-07-17 ENCOUNTER — Other Ambulatory Visit: Payer: Self-pay

## 2020-07-17 ENCOUNTER — Ambulatory Visit: Payer: PRIVATE HEALTH INSURANCE

## 2020-07-17 DIAGNOSIS — R7989 Other specified abnormal findings of blood chemistry: Secondary | ICD-10-CM

## 2020-07-17 DIAGNOSIS — E876 Hypokalemia: Secondary | ICD-10-CM

## 2020-07-18 LAB — BASIC METABOLIC PANEL
BUN/Creatinine Ratio: 18 (ref 9–20)
BUN: 27 mg/dL — ABNORMAL HIGH (ref 6–24)
CO2: 26 mmol/L (ref 20–29)
Calcium: 9.8 mg/dL (ref 8.7–10.2)
Chloride: 99 mmol/L (ref 96–106)
Creatinine, Ser: 1.51 mg/dL — ABNORMAL HIGH (ref 0.76–1.27)
GFR calc Af Amer: 60 mL/min/{1.73_m2} (ref >59)
GFR calc non Af Amer: 52 mL/min/{1.73_m2} — ABNORMAL LOW (ref >59)
Glucose: 112 mg/dL — ABNORMAL HIGH (ref 65–99)
Potassium: 3.4 mmol/L — ABNORMAL LOW (ref 3.5–5.2)
Sodium: 139 mmol/L (ref 134–144)

## 2020-07-22 ENCOUNTER — Other Ambulatory Visit: Payer: Self-pay

## 2020-07-22 DIAGNOSIS — E785 Hyperlipidemia, unspecified: Secondary | ICD-10-CM

## 2020-07-22 DIAGNOSIS — I10 Essential (primary) hypertension: Secondary | ICD-10-CM

## 2020-07-30 ENCOUNTER — Ambulatory Visit (INDEPENDENT_AMBULATORY_CARE_PROVIDER_SITE_OTHER): Payer: 59 | Admitting: Orthopaedic Surgery

## 2020-07-30 ENCOUNTER — Encounter: Payer: Self-pay | Admitting: Orthopaedic Surgery

## 2020-07-30 ENCOUNTER — Other Ambulatory Visit: Payer: Self-pay

## 2020-07-30 DIAGNOSIS — M7542 Impingement syndrome of left shoulder: Secondary | ICD-10-CM

## 2020-07-30 DIAGNOSIS — Z96612 Presence of left artificial shoulder joint: Secondary | ICD-10-CM

## 2020-07-30 DIAGNOSIS — E1165 Type 2 diabetes mellitus with hyperglycemia: Secondary | ICD-10-CM

## 2020-07-30 NOTE — Progress Notes (Signed)
Office Visit Note   Patient: Dylan Abbott           Date of Birth: 1966-04-23           MRN: [id number] Visit Date: 07/30/2020              Requested by: [name], MD [address] [place],  [place] [address] PCP: [name], MD   Assessment & Plan: Visit Diagnoses: Impingement syndrome left shoulder  R/O rotator cuff pathology/tear   Plan:  #1: At this time I had suggested about corticosteroid injections and after discussing this with him and especially with his problems with his diabetes he would like not to have that performed.  Therefore my plan would be then to do an MRI scan to rule out the pathology.  He will return after his MRI scan for further delineation of treatment  Follow-Up Instructions: No follow-ups on file.   Orders:  Orders Placed This Encounter  Procedures  . MR Shoulder Left w/o contrast   No orders of the defined types were placed in this encounter.     Procedures: No procedures performed   Clinical Data: No additional findings.   Subjective: Chief Complaint  Patient presents with  . Left Shoulder - Pain   HPI Patient presents today for left shoulder pain. He said that his shoulder has hurt for about a year. No known injury. His pain is located more superiorly. Range of motion is good. No numbness, tingling, or weakness. He is right hand dominant. He does not take anything for pain. No previous shoulder surgery. His pain is worse if he has been sleeping on that side at night. Lifting anything with that arm causes more pain as well. He had x-rays of his shoulder on 07/09/2020, but no previous treatment per patient.   Review of Systems  Constitutional: Negative for fatigue.  HENT: Negative for ear pain.   Eyes: Negative for pain.  Respiratory: Negative for shortness of breath.   Cardiovascular: Negative for leg swelling.  Gastrointestinal: Negative for constipation and diarrhea.  Endocrine: Negative for cold intolerance and  heat intolerance.  Genitourinary: Negative for difficulty urinating.  Musculoskeletal: Negative for joint swelling.  Skin: Negative for rash.  Allergic/Immunologic: Negative for food allergies.  Neurological: Negative for weakness.  Hematological: Does not bruise/bleed easily.  Psychiatric/Behavioral: Negative for sleep disturbance.     Objective: Vital Signs: Ht 6' (1.829 m)   Wt 230 lb (104.3 kg)   BMI 31.19 kg/m   Physical Exam Constitutional:      Appearance: Normal appearance. He is well-developed and normal weight.  HENT:     Head: Normocephalic and atraumatic.  Eyes:     Pupils: Pupils are equal, round, and reactive to light.  Pulmonary:     Effort: Pulmonary effort is normal.  Skin:    General: Skin is warm and dry.  Neurological:     Mental Status: He is alert and oriented to person, place, and time.  Psychiatric:        Mood and Affect: Mood normal.        Behavior: Behavior normal.        Thought Content: Thought content normal.        Judgment: Judgment normal.     Ortho Exam  Exam today reveals little bit of tenderness over the [facility] joint.  He can AB duct 160 degrees forward flex 170 degrees.  External rotation at 90 degrees of abduction is 80 degrees internal rotation  he cannot put his thumb to his spine.  He does have mild positive empty can test.  Not really particular painful over the [facility] joint.  Specialty Comments:  No specialty comments available.  Imaging: No acute fracture or dislocation. Mild acromioclavicular joint space narrowing with marginal osteophytes. Bone mineralization is normal. Soft tissues are unremarkable  07/09/2020  PMFS History: Current Outpatient Medications  Medication Sig Dispense Refill  . Accu-Chek FastClix Lancets MISC USE TO CHECK BLOOD SUGAR EVERY DAY 102 each 0  . ACCU-CHEK GUIDE test strip USE TO CHECK BLOOD SUGAR ONCE A DAY 100 strip 0  . blood glucose meter kit and supplies Dispense based on patient and insurance  preference. Once per day - fasting or 2 hrs after meal  Dx: E11.65. 1 each 0  . glipiZIDE (GLUCOTROL XL) 10 MG 24 hr tablet TAKE 1 TABLET(10 MG) BY MOUTH TWICE DAILY 180 tablet 1  . metFORMIN (GLUCOPHAGE) 850 MG tablet Take 1 tablet (850 mg total) by mouth 2 (two) times daily with a meal. 180 tablet 1  . NIFEdipine (PROCARDIA XL) 90 MG 24 hr tablet Take 1 tablet (90 mg total) by mouth daily. 90 tablet 1  . pioglitazone (ACTOS) 15 MG tablet TAKE 1 TABLET(15 MG) BY MOUTH DAILY 90 tablet 1  . potassium chloride SA (KLOR-CON) 20 MEQ tablet Take 1 tablet (20 mEq total) by mouth daily. 30 tablet 1  . simvastatin (ZOCOR) 80 MG tablet Take 1 tablet (80 mg total) by mouth daily at 6 PM. 90 tablet 1   No current facility-administered medications for this visit.    Patient Active Problem List   Diagnosis Date Noted  . Impingement syndrome of left shoulder 07/30/2020  . COVID-19 virus infection 10/31/2019  . Abnormal liver function 10/31/2019  . Hypokalemia 10/31/2019  . Hyponatremia 10/31/2019  . Diabetes mellitus ([place]) 11/13/2017  . Hyperlipidemia 11/13/2017  . Hypertensive disorder 11/13/2017   Past Medical History:  Diagnosis Date  . Diabetes mellitus without complication ([place])   . Hyperlipidemia   . Hypertension     History reviewed. No pertinent family history.  History reviewed. No pertinent surgical history. Social History   Occupational History  . Occupation: environmental  Tobacco Use  . Smoking status: Never Smoker  . Smokeless tobacco: Never Used  Substance and Sexual Activity  . Alcohol use: Never    Alcohol/week: 0.0 standard drinks  . Drug use: Never  . Sexual activity: Yes

## 2020-08-14 ENCOUNTER — Ambulatory Visit (INDEPENDENT_AMBULATORY_CARE_PROVIDER_SITE_OTHER): Payer: PRIVATE HEALTH INSURANCE | Admitting: Family Medicine

## 2020-08-14 ENCOUNTER — Other Ambulatory Visit: Payer: Self-pay

## 2020-08-14 ENCOUNTER — Encounter: Payer: Self-pay | Admitting: Family Medicine

## 2020-08-14 VITALS — BP 119/64 | HR 75 | Temp 98.4°F | Ht 72.0 in | Wt 240.0 lb

## 2020-08-14 DIAGNOSIS — E876 Hypokalemia: Secondary | ICD-10-CM

## 2020-08-14 DIAGNOSIS — Z1211 Encounter for screening for malignant neoplasm of colon: Secondary | ICD-10-CM

## 2020-08-14 DIAGNOSIS — E1165 Type 2 diabetes mellitus with hyperglycemia: Secondary | ICD-10-CM | POA: Diagnosis not present

## 2020-08-14 MED ORDER — POTASSIUM CHLORIDE ER 10 MEQ PO TBCR: 20.0000 meq | EXTENDED_RELEASE_TABLET | Freq: Every day | ORAL | 1 refills | Status: DC

## 2020-08-14 NOTE — Progress Notes (Signed)
Subjective:  Patient ID: Dylan Abbott, male    DOB: 27-Oct-1966  Age: 54 y.o. MRN: [id number]  CC:  Chief Complaint  Patient presents with  . Follow-up    on diabetes and low potasium.  Pt had labs done out side of our office and brought the results with him today for provider to review. pt's employer is requesting an A1C be drawn and the results faxed to them.Pt reports he disn't check his BS at home.    HPI Dylan Abbott presents for   Diabetes: Uncontrolled, complicated by hyperglycemia, CKD, followed by nephrology.  Previously had elevations due to dexamethasone during COVID-19 treatment.  Metformin increased to 850 mg twice daily in May.  Has been off Actos for 2 weeks at that time, was continued on glipizide.  Some confusion previously regarding Metformin dosing instructions and was taking 850 mg daily.  Increase to twice per day June 10 visit.  Unfortunately still on once per day metformin at last visit in July.  Was continuing Actos and glipizide daily.  Clarify dosing instructions with Metformin.  Microalbumin: Normal ratio Apr 24, 2020.   Optho, foot exam, pneumovax:  Is on statin.  Increased dose in June, LDL 93 recently.  Not checking readings. Taking metformin BID now, still on actos and glipizide. No known symptomatic lows.   Lab Results  Component Value Date   HGBA1C 9.0 (A) 04/24/2020   HGBA1C 9.7 (H) 11/01/2019   HGBA1C 8.1 (H) 07/25/2019   Lab Results  Component Value Date   LDLCALC 93 07/09/2020   CREATININE 1.51 (H) 07/17/2020    Anemia: HGB 12.8 on work physical 07/14/20.  No dark stools or blood in stools.  Reported HGB 12.9 in 2020 on lab form from work in 2020 Has not had colonoscopy - agrees to referral.  Lab Results  Component Value Date   WBC 8.1 11/04/2019   HGB 14.0 11/04/2019   HCT 39.3 11/04/2019   MCV 84.7 11/04/2019   PLT 309 11/04/2019   Hypokalemia: Taking potassium 46mq QD, started on 7/23.  Outside labs on 7/27 for job physical  - potassium 3.4.  3.4 on 7/30.   Abnormal EKG: No chest pains or dyspnea.  Had EKG through work physical on 07/30/2020 with sinus rhythm, rate 61, PR 252, first-degree AV block, reported intra-atrial conduction delay.  Horizontal axis.  Compared to EKG on 10/31/19, also with prolonged PR interval at that time, sinus rhythm.  No apparent acute changes or significant changes from that EKG.     History Patient Active Problem List   Diagnosis Date Noted  . Impingement syndrome of left shoulder 07/30/2020  . COVID-19 virus infection 10/31/2019  . Abnormal liver function 10/31/2019  . Hypokalemia 10/31/2019  . Hyponatremia 10/31/2019  . Diabetes [place] 11/13/2017  . Hyperlipidemia 11/13/2017  . Hypertensive disorder 11/13/2017   Past Medical History:  Diagnosis Date  . [place]   . Hyperlipidemia   . Hypertension    No past surgical history on file. No Known Allergies Prior to Admission medications   Medication Sig Start Date End Date Taking? Authorizing Provider  Accu-Chek FastClix Lancets MISC USE TO CHECK BLOOD SUGAR EVERY DAY 01/24/20  Yes GWendie Agreste MD  ACCU-CHEK GUIDE test strip USE TO CHECK BLOOD SUGAR ONCE A DAY 10/25/19  Yes GWendie Agreste MD  blood glucose meter kit and supplies Dispense based on patient and insurance preference. Once per day - fasting or 2 hrs after meal  Dx: E11.65. 07/25/19  Yes [name], MD  glipiZIDE (GLUCOTROL XL) 10 MG 24 hr tablet TAKE 1 TABLET(10 MG) BY MOUTH TWICE DAILY 04/24/20  Yes [name], MD  metFORMIN (GLUCOPHAGE) 850 MG tablet Take 1 tablet (850 mg total) by mouth 2 (two) times daily with a meal. 07/09/20  Yes [name], MD  NIFEdipine (PROCARDIA XL) 90 MG 24 hr tablet Take 1 tablet (90 mg total) by mouth daily. 04/24/20  Yes [name], MD  pioglitazone (ACTOS) 15 MG tablet TAKE 1 TABLET(15 MG) BY MOUTH DAILY 04/24/20  Yes [name], MD  potassium  chloride SA (KLOR-CON) 20 MEQ tablet Take 1 tablet (20 mEq total) by mouth daily. 07/09/20  Yes [name], MD  simvastatin (ZOCOR) 80 MG tablet Take 1 tablet (80 mg total) by mouth daily at 6 PM. 05/28/20  Yes [name], MD   Social History   Socioeconomic History  . Marital status: Married    Spouse name: Not on file  . Number of children: 2  . Years of education: Not on file  . Highest education level: Not on file  Occupational History  . Occupation: environmental  Tobacco Use  . Smoking status: Never Smoker  . Smokeless tobacco: Never Used  Substance and Sexual Activity  . Alcohol use: Never    Alcohol/week: 0.0 standard drinks  . Drug use: Never  . Sexual activity: Yes  Other Topics Concern  . Not on file  Social History Narrative  . Not on file   Social Determinants of Health   Financial Resource Strain:   . Difficulty of Paying Living Expenses: Not on file  Food Insecurity:   . Worried About Charity fundraiser in the Last Year: Not on file  . Ran Out of Food in the Last Year: Not on file  Transportation Needs:   . Lack of Transportation (Medical): Not on file  . Lack of Transportation (Non-Medical): Not on file  Physical Activity:   . Days of Exercise per Week: Not on file  . Minutes of Exercise per Session: Not on file  Stress:   . Feeling of Stress : Not on file  Social Connections:   . Frequency of Communication with Friends and Family: Not on file  . Frequency of Social Gatherings with Friends and Family: Not on file  . Attends Religious Services: Not on file  . Active Member of Clubs or Organizations: Not on file  . Attends Archivist Meetings: Not on file  . Marital Status: Not on file  Intimate Partner Violence:   . Fear of Current or Ex-Partner: Not on file  . Emotionally Abused: Not on file  . Physically Abused: Not on file  . Sexually Abused: Not on file    Review of Systems  Constitutional: Negative for fatigue and  unexpected weight change.  Eyes: Negative for visual disturbance.  Respiratory: Negative for cough, chest tightness and shortness of breath.   Cardiovascular: Negative for chest pain, palpitations and leg swelling.  Gastrointestinal: Negative for abdominal pain, anal bleeding and blood in stool.  Neurological: Negative for dizziness, light-headedness and headaches.     Objective:   Vitals:   08/14/20 1632  BP: 119/64  Pulse: 75  Temp: 98.4 F (36.9 C)  TempSrc: Temporal  SpO2: 96%  Weight: 240 lb (108.9 kg)  Height: 6' (1.829 m)     Physical Exam Vitals reviewed.  Constitutional:      Appearance:  He is well-developed.  HENT:     Head: Normocephalic and atraumatic.  Eyes:     Pupils: Pupils are equal, round, and reactive to light.  Neck:     Vascular: No carotid bruit or JVD.  Cardiovascular:     Rate and Rhythm: Normal rate and regular rhythm.     Heart sounds: Normal heart sounds. No murmur heard.   Pulmonary:     Effort: Pulmonary effort is normal.     Breath sounds: Normal breath sounds. No rales.  Skin:    General: Skin is warm and dry.  Neurological:     Mental Status: He is alert and oriented to person, place, and time.  Psychiatric:        Mood and Affect: Mood normal.        Behavior: Behavior normal.    34 minutes spent during visit, greater than 50% counseling and assimilation of information, chart review, and discussion of plan.    Assessment & Plan:  Dylan Abbott is a 54 y.o. male . Type 2 diabetes mellitus with hyperglycemia, without long-term current use of insulin (HCC) - Plan: Hemoglobin A1c  - check A1c, continue same regimen for now.  Will need A1c faxed to his work from recent physical.  Special screening for malignant neoplasms, colon - Plan: Ambulatory referral to Gastroenterology  -Borderline anemia at recent work physical.  Due for colon cancer screening, colonoscopy referral placed  Hypokalemia - Plan: potassium chloride  (KLOR-CON) 10 MEQ tablet, Basic metabolic panel  -Change to 10 mEq for smaller pills.  Continue 20 mEq dosing per day.  No orders of the defined types were placed in this encounter.  Patient Instructions    EKG looks similar to previous ones.  I will recheck your potassium level today.  Potassium was changed to a smaller pill but take 2/day.  No med changes for now otherwise until I see your hemoglobin A1c.  Recheck in 3 months.  I will refer you to gastroenterology for colonoscopy.   If you have lab work done today you will be contacted with your lab results within the next 2 weeks.  If you have not heard from [place] then please contact us. The fastest way to get your results is to register for My Chart.   IF you received an x-ray today, you will receive an invoice from [facility]. Please contact [facility] at [phone] with questions or concerns regarding your invoice.   IF you received labwork today, you will receive an invoice from [place]. Please contact [facility] at [phone] with questions or concerns regarding your invoice.   Our billing staff will not be able to assist you with questions regarding bills from these companies.  You will be contacted with the lab results as soon as they are available. The fastest way to get your results is to activate your My Chart account. Instructions are located on the last page of this paperwork. If you have not heard from [place] regarding the results in 2 weeks, please contact this office.         Signed, Merri Ray, MD [facility]

## 2020-08-14 NOTE — Patient Instructions (Addendum)
  EKG looks similar to previous ones.  I will recheck your potassium level today.  Potassium was changed to a smaller pill but take 2/day.  No med changes for now otherwise until I see your hemoglobin A1c.  Recheck in 3 months.  I will refer you to gastroenterology for colonoscopy.   If you have lab work done today you will be contacted with your lab results within the next 2 weeks.  If you have not heard from [place] then please contact us. The fastest way to get your results is to register for My Chart.   IF you received an x-ray today, you will receive an invoice from [facility]. Please contact [facility] at [phone] with questions or concerns regarding your invoice.   IF you received labwork today, you will receive an invoice from [place]. Please contact [facility] at [phone] with questions or concerns regarding your invoice.   Our billing staff will not be able to assist you with questions regarding bills from these companies.  You will be contacted with the lab results as soon as they are available. The fastest way to get your results is to activate your My Chart account. Instructions are located on the last page of this paperwork. If you have not heard from [place] regarding the results in 2 weeks, please contact this office.

## 2020-08-15 LAB — HEMOGLOBIN A1C
Est. average glucose Bld gHb Est-mCnc: 174 mg/dL
Hgb A1c MFr Bld: 7.7 % — ABNORMAL HIGH (ref 4.8–5.6)

## 2020-08-15 LAB — BASIC METABOLIC PANEL
BUN/Creatinine Ratio: 17 (ref 9–20)
BUN: 31 mg/dL — ABNORMAL HIGH (ref 6–24)
CO2: 24 mmol/L (ref 20–29)
Calcium: 9.3 mg/dL (ref 8.7–10.2)
Chloride: 99 mmol/L (ref 96–106)
Creatinine, Ser: 1.84 mg/dL — ABNORMAL HIGH (ref 0.76–1.27)
GFR calc Af Amer: 47 mL/min/{1.73_m2} — ABNORMAL LOW (ref >59)
GFR calc non Af Amer: 41 mL/min/{1.73_m2} — ABNORMAL LOW (ref >59)
Glucose: 88 mg/dL (ref 65–99)
Potassium: 3.6 mmol/L (ref 3.5–5.2)
Sodium: 138 mmol/L (ref 134–144)

## 2020-08-25 ENCOUNTER — Encounter: Payer: Self-pay | Admitting: Radiology

## 2020-10-22 ENCOUNTER — Telehealth: Payer: Self-pay

## 2020-10-22 NOTE — Telephone Encounter (Signed)
Attempted to reach patient-unable to leave a VM- no show letter being sent to patient; pre visit and procedure appts cancelled as patient was not reached; patient will need to call back to the office to be rescheduled for pre visit and procedure;

## 2020-10-22 NOTE — Telephone Encounter (Signed)
Attempted to reach patient concerning pre visit as patient was a no show today; RN unable to leave a VM as mailbox is full; will attempt to reach patient at a later time;

## 2020-11-05 ENCOUNTER — Encounter: Payer: 59 | Admitting: Gastroenterology

## 2020-11-08 ENCOUNTER — Other Ambulatory Visit: Payer: Self-pay | Admitting: Family Medicine

## 2020-11-08 DIAGNOSIS — E785 Hyperlipidemia, unspecified: Secondary | ICD-10-CM

## 2020-11-08 DIAGNOSIS — E1165 Type 2 diabetes mellitus with hyperglycemia: Secondary | ICD-10-CM

## 2020-11-08 DIAGNOSIS — I10 Essential (primary) hypertension: Secondary | ICD-10-CM

## 2020-11-16 ENCOUNTER — Ambulatory Visit: Payer: PRIVATE HEALTH INSURANCE | Admitting: Family Medicine

## 2020-11-27 ENCOUNTER — Telehealth: Payer: Self-pay | Admitting: Family Medicine

## 2020-11-27 DIAGNOSIS — E785 Hyperlipidemia, unspecified: Secondary | ICD-10-CM

## 2020-11-27 MED ORDER — SIMVASTATIN 80 MG PO TABS: 80.0000 mg | ORAL_TABLET | Freq: Every day | ORAL | 0 refills | Status: DC

## 2020-11-27 NOTE — Telephone Encounter (Signed)
What is the name of the medication? glipiZIDE (GLUCOTROL XL) 10 MG 24 hr tablet [[id number]], NIFEdipine (PROCARDIA XL/NIFEDICAL-XL) 90 MG 24 hr,  pioglitazone (ACTOS) 15 MG tablet [[id number]], metFORMIN (GLUCOPHAGE) 850 MG tablet [[id number]] and simvastatin (ZOCOR) 80 MG tablet [[id number]]   Have you contacted your pharmacy to request a refill? Yes, pt needs a refill on these prescriptions. He has an upcoming app with Neva Seat on 12/17/20. I tried to move his app up, but he has a hard time getting off work.    Which pharmacy would you like this sent to? Pharmacy  [id number] [place], [place] - [phone] AT [facility] OF [facility] [address]  [address] [place], [place] [address]  Phone:  [phone] Fax:  [phone]  DEA #:  [id number]      Patient notified that their request is being sent to the clinical staff for review and that they should receive a call once it is complete. If they do not receive a call within 72 hours they can check with their pharmacy or our office.

## 2020-11-27 NOTE — Telephone Encounter (Signed)
A curtesy refill for the pt's simvastatin has been sent in. Other medications show the pt should have enough supply of these medication to get the pt to his appt.

## 2020-12-07 ENCOUNTER — Other Ambulatory Visit: Payer: Self-pay | Admitting: Family Medicine

## 2020-12-07 DIAGNOSIS — I10 Essential (primary) hypertension: Secondary | ICD-10-CM

## 2020-12-07 DIAGNOSIS — E785 Hyperlipidemia, unspecified: Secondary | ICD-10-CM

## 2020-12-07 DIAGNOSIS — E1165 Type 2 diabetes mellitus with hyperglycemia: Secondary | ICD-10-CM

## 2020-12-07 NOTE — Telephone Encounter (Signed)
Medication Refill - Medication: Glipizide, nifedipine, pioglitazone, metformin, simvastatin   Has the patient contacted their pharmacy? Yes.   Pt states that he is completely out of these medications and the pharmacy states that he has no refills. Please advise.  (Agent: If no, request that the patient contact the pharmacy for the refill.) (Agent: If yes, when and what did the pharmacy advise?)  Preferred Pharmacy (with phone number or street name):  [id number] [place], [place] - [address] AT [facility] OF [facility] & [address]  [address] [place] [address]  Phone: [phone] Fax: [phone]  Hours: Not open 24 hours     Agent: Please be advised that RX refills may take up to 3 business days. We ask that you follow-up with your pharmacy.

## 2020-12-07 NOTE — Telephone Encounter (Signed)
Requested medication (s) are due for refill today:   No.  All of his refills are enough to get him to his appt with Dr. Carlota Raspberry on 12/17/2020.  Not sure why he is out of his medications.   He received 30 day courtesy supply of Zocor on 11/27/2020 #30, 0 refills.  Requested medication (s) are on the active medication list:   Yes  Future visit scheduled:   Yes 12/17/2020   Last ordered: Returned because he should have enough medication to last until his appt. Also there is a note that he will have to keep his appt for any more refills.   Requested Prescriptions  Pending Prescriptions Disp Refills   simvastatin (ZOCOR) 80 MG tablet 30 tablet 0    Sig: Take 1 tablet (80 mg total) by mouth daily at 6 PM. Pt will need to keep his appt for future refills      Cardiovascular:  Antilipid - Statins Failed - 12/07/2020  1:07 PM      Failed - LDL in normal range and within 360 days    LDL Chol Calc (NIH)  Date Value Ref Range Status  07/09/2020 93 0 - 99 mg/dL Final          Passed - Total Cholesterol in normal range and within 360 days    Cholesterol, Total  Date Value Ref Range Status  07/09/2020 159 100 - 199 mg/dL Final          Passed - HDL in normal range and within 360 days    HDL  Date Value Ref Range Status  07/09/2020 52 >39 mg/dL Final          Passed - Triglycerides in normal range and within 360 days    Triglycerides  Date Value Ref Range Status  07/09/2020 75 0 - 149 mg/dL Final          Passed - Patient is not pregnant      Passed - Valid encounter within last 12 months    Recent Outpatient Visits           3 months ago Type 2 diabetes mellitus with hyperglycemia, without long-term current use of insulin ([place])   [facility], Ranell Patrick, MD   5 months ago Type 2 diabetes mellitus with hyperglycemia, without long-term current use of insulin [facility])   [facility], Ranell Patrick, MD   6 months ago Hypokalemia   [facility], Ranell Patrick, MD   7 months ago Type 2 diabetes mellitus with hyperglycemia, without long-term current use of insulin [facility])   [facility], Ranell Patrick, MD   1 year ago Pneumonia due to COVID-19 virus   [facility], Ranell Patrick, MD       Future Appointments             In 1 week [name], MD [facility] [place], [facility]               NIFEdipine (PROCARDIA XL/NIFEDICAL-XL) 90 MG 24 hr tablet 90 tablet 1    Sig: TAKE 1 TABLET(90 MG) BY MOUTH DAILY      Cardiovascular:  Calcium Channel Blockers Passed - 12/07/2020  1:07 PM      Passed - Last BP in normal range    BP Readings from Last 1 Encounters:  08/14/20 119/64          Passed - Valid encounter  within last 6 months    Recent Outpatient Visits           3 months ago Type 2 diabetes mellitus with hyperglycemia, without long-term current use of insulin (HCC)   [facility], Jeffrey R, MD   5 months ago Type 2 diabetes mellitus with hyperglycemia, without long-term current use of insulin (HCC)   [facility], Jeffrey R, MD   6 months ago Hypokalemia   [facility], Jeffrey R, MD   7 months ago Type 2 diabetes mellitus with hyperglycemia, without long-term current use of insulin (HCC)   [facility], Jeffrey R, MD   1 year ago Pneumonia due to COVID-19 virus   [facility], Jeffrey R, MD       Future Appointments             In 1 week [facility], Jeffrey R, MD [facility] [place], [facility]               pioglitazone (ACTOS) 15 MG tablet 90 tablet 1    Sig: TAKE 1 TABLET(15 MG) BY MOUTH DAILY      Endocrinology:  Diabetes - Glitazones - pioglitazone Passed - 12/07/2020  1:07 PM      Passed - HBA1C is between 0 and 7.9 and within 180 days    Hgb A1c MFr Bld  Date Value Ref Range Status  08/14/2020 7.7 (H) 4.8 - 5.6 % Final    Comment:             Prediabetes: 5.7 - 6.4           Diabetes: >6.4          Glycemic control for adults with diabetes: <7.0           Passed - Valid encounter within last 6 months    Recent Outpatient Visits           3 months ago Type 2 diabetes mellitus with hyperglycemia, without long-term current use of insulin (HCC)   [facility], Jeffrey R, MD   5 months ago Type 2 diabetes mellitus with hyperglycemia, without long-term current use of insulin (HCC)   [facility] [place] [facility], Jeffrey R, MD   6 months ago Hypokalemia   [facility] [place] [facility], Jeffrey R, MD   7 months ago Type 2 diabetes mellitus with hyperglycemia, without long-term current use of insulin (HCC)   [facility] [place] [facility], Jeffrey R, MD   1 year ago Pneumonia due to COVID-19 virus   [facility] [place] [facility], Jeffrey R, MD       Future Appointments             In 1 week Greene, Jeffrey R, MD [facility] [place], [facility]               metFORMIN (GLUCOPHAGE) 850 MG tablet 180 tablet 1    Sig: Take 1 tablet (850 mg total) by mouth 2 (two) times daily with a meal.      Endocrinology:  Diabetes - Biguanides Failed - 12/07/2020  1:07 PM      Failed - Cr in normal range and within 360 days    Creatinine, Ser  Date Value Ref Range Status  08/14/2020 1.84 (H) 0.76 - 1.27 mg/dL Final          Failed - AA eGFR in normal range   and within 360 days    GFR calc Af Amer  Date Value Ref Range Status  08/14/2020 47 (L) >59 mL/min/1.73 Final    Comment:    **[facility] currently reports eGFR in compliance with the current**   recommendations of the National Kidney Foundation. [facility] will   update reporting as new guidelines are published from the NKF-ASN   Task force.    GFR calc non Af Amer  Date Value Ref Range Status  08/14/2020 41 (L) >59 mL/min/1.73 Final          Passed - HBA1C is between 0 and 7.9 and within 180 days    Hgb A1c MFr Bld  Date Value Ref Range Status  08/14/2020 7.7 (H) 4.8 - 5.6 % Final     Comment:             Prediabetes: 5.7 - 6.4          Diabetes: >6.4          Glycemic control for adults with diabetes: <7.0           Passed - Valid encounter within last 6 months    Recent Outpatient Visits           3 months ago Type 2 diabetes mellitus with hyperglycemia, without long-term current use of insulin (HCC)   [facility] [place] [facility], Jeffrey R, MD   5 months ago Type 2 diabetes mellitus with hyperglycemia, without long-term current use of insulin (HCC)   [facility] [place] [facility], Jeffrey R, MD   6 months ago Hypokalemia   [facility] [place] [facility], Jeffrey R, MD   7 months ago Type 2 diabetes mellitus with hyperglycemia, without long-term current use of insulin (HCC)   [facility] [place] [facility], Jeffrey R, MD   1 year ago Pneumonia due to COVID-19 virus   [facility] [place] [facility], Jeffrey R, MD       Future Appointments             In 1 week Greene, Jeffrey R, MD [facility] [place], [facility]               glipiZIDE (GLUCOTROL XL) 10 MG 24 hr tablet 180 tablet 1    Sig: TAKE 1 TABLET(10 MG) BY MOUTH TWICE DAILY      Endocrinology:  Diabetes - Sulfonylureas Passed - 12/07/2020  1:07 PM      Passed - HBA1C is between 0 and 7.9 and within 180 days    Hgb A1c MFr Bld  Date Value Ref Range Status  08/14/2020 7.7 (H) 4.8 - 5.6 % Final    Comment:             Prediabetes: 5.7 - 6.4          Diabetes: >6.4          Glycemic control for adults with diabetes: <7.0           Passed - Valid encounter within last 6 months    Recent Outpatient Visits           3 months ago Type 2 diabetes mellitus with hyperglycemia, without long-term current use of insulin (HCC)   [facility] [place] [facility], Jeffrey R, MD   5 months ago Type 2 diabetes mellitus with hyperglycemia, without long-term current use of insulin (HCC)   [facility] [place] [facility], Jeffrey R, MD   6 months ago Hypokalemia   [facility]   [place] [facility], Jeffrey R, MD    7 months ago Type 2 diabetes mellitus with hyperglycemia, without long-term current use of insulin (HCC)   [facility] [place] [facility], Jeffrey R, MD   1 year ago Pneumonia due to COVID-19 virus   [facility] [place] [facility], Jeffrey R, MD       Future Appointments             In 1 week Greene, Jeffrey R, MD [facility]               

## 2020-12-08 MED ORDER — SIMVASTATIN 80 MG PO TABS: 80.0000 mg | ORAL_TABLET | Freq: Every day | ORAL | 0 refills | Status: DC

## 2020-12-08 MED ORDER — NIFEDIPINE ER OSMOTIC RELEASE 90 MG PO TB24: ORAL_TABLET | ORAL | 1 refills | Status: DC

## 2020-12-08 MED ORDER — GLIPIZIDE ER 10 MG PO TB24: ORAL_TABLET | ORAL | 1 refills | Status: DC

## 2020-12-08 MED ORDER — PIOGLITAZONE HCL 15 MG PO TABS: ORAL_TABLET | ORAL | 1 refills | Status: DC

## 2020-12-08 MED ORDER — METFORMIN HCL 850 MG PO TABS: 850.0000 mg | ORAL_TABLET | Freq: Two times a day (BID) | ORAL | 1 refills | Status: DC

## 2020-12-10 ENCOUNTER — Ambulatory Visit: Payer: PRIVATE HEALTH INSURANCE | Admitting: Family Medicine

## 2020-12-17 ENCOUNTER — Other Ambulatory Visit: Payer: Self-pay

## 2020-12-17 ENCOUNTER — Telehealth (INDEPENDENT_AMBULATORY_CARE_PROVIDER_SITE_OTHER): Payer: PRIVATE HEALTH INSURANCE | Admitting: Family Medicine

## 2020-12-17 ENCOUNTER — Encounter: Payer: Self-pay | Admitting: Family Medicine

## 2020-12-17 VITALS — Ht 72.0 in

## 2020-12-17 DIAGNOSIS — E1165 Type 2 diabetes mellitus with hyperglycemia: Secondary | ICD-10-CM | POA: Diagnosis not present

## 2020-12-17 DIAGNOSIS — R7989 Other specified abnormal findings of blood chemistry: Secondary | ICD-10-CM | POA: Diagnosis not present

## 2020-12-17 DIAGNOSIS — I10 Essential (primary) hypertension: Secondary | ICD-10-CM

## 2020-12-17 DIAGNOSIS — Z113 Encounter for screening for infections with a predominantly sexual mode of transmission: Secondary | ICD-10-CM

## 2020-12-17 DIAGNOSIS — E876 Hypokalemia: Secondary | ICD-10-CM | POA: Diagnosis not present

## 2020-12-17 NOTE — Patient Instructions (Signed)
° ° ° °  If you have lab work done today you will be contacted with your lab results within the next 2 weeks.  If you have not heard from us then please contact us. The fastest way to get your results is to register for My Chart. ° ° °IF you received an x-ray today, you will receive an invoice from [facility]. Please contact [place] [facility] at [phone] with questions or concerns regarding your invoice.  ° °IF you received labwork today, you will receive an invoice from [facility]. Please contact [facility] at [phone] with questions or concerns regarding your invoice.  ° °Our billing staff will not be able to assist you with questions regarding bills from these companies. ° °You will be contacted with the lab results as soon as they are available. The fastest way to get your results is to activate your My Chart account. Instructions are located on the last page of this paperwork. If you have not heard from us regarding the results in 2 weeks, please contact this office. °  ° ° ° °

## 2020-12-17 NOTE — Progress Notes (Signed)
Virtual Visit via audio Note  I connected with Dylan Abbott on 12/17/20 at 4:36 PM by audio - unable to connect by video.  and verified that I am speaking with the correct person using two identifiers.  Patient location:home My location: home   I discussed the limitations, risks, security and privacy concerns of performing an evaluation and management service by telephone and the availability of in person appointments. I also discussed with the patient that there may be a patient responsible charge related to this service. The patient expressed understanding and agreed to proceed, consent obtained  Chief complaint:  Chief Complaint  Patient presents with  . Hypertension    Pt reports no issue with this condition since last OV.  . Diabetes    Pt reports no issues with this condition. Pt states he has had issues getting hie medication this month. After reviewing medication with the pt we sent in most of his medication on 12/08/2020 no sure why pt was having issues.I advised the pt to call his pharmacy and see if they have his prescriptions and to get back to [place] if their is an issue on our end.   . STD testing    Pt wants to be tested. Pt has started a new relation ship and just wants to be tested.    History of Present Illness: Dylan Abbott is a 54 y.o. male  Diabetes: Complicated by hyperglycemia, CKD, and followed by nephrology.  Some confusion in the past regarding Metformin dosing, was taking Metformin 850 mg twice daily at his August visit, remained on Actos and glipizide.  Recommended office visit in August to discuss other options given his elevated creatinine.  He has remained on Metformin 850 mg twice daily, Actos 15 mg daily, and glipizide XL 10 mg BID.  Out of medicine in December? Problem with refills.  Most recent EGFR 47 on August 27. He is on statin with Zocor 80 mg daily - has been off past month.  A1c was improved at 7.7 in August compared to 9.0 in May, off some  meds at that time.  Home readings: none.  Microalbumin: Normal ratio in May Optho, foot exam, pneumovax:   Lab Results  Component Value Date   HGBA1C 7.7 (H) 08/14/2020   HGBA1C 9.0 (A) 04/24/2020   HGBA1C 9.7 (H) 11/01/2019   Lab Results  Component Value Date   LDLCALC 93 07/09/2020   CREATININE 1.84 (H) 08/14/2020   Hypertension with hypokalemia, Nifedipine 90 mg daily. Potassium improved at August visit on supplementation.  Treated with Klor-Con 10 mEq - 2 daily.  No recent potassium - last took few months ago.   Constitutional: Negative for fatigue and unexpected weight change.  Eyes: Negative for visual disturbance.  Respiratory: Negative for cough, chest tightness and shortness of breath.   Cardiovascular: Negative for chest pain, palpitations and leg swelling.  Gastrointestinal: Negative for abdominal pain and blood in stool.  Neurological: Negative for dizziness, light-headedness and headaches.     Lab Results  Component Value Date   CREATININE 1.84 (H) 08/14/2020   Lab Results  Component Value Date   K 3.6 08/14/2020    STI screening: New partner, no symptoms. Wants to get tested for new relationship. No penile d/c or rash.    Patient Active Problem List   Diagnosis Date Noted  . Impingement syndrome of left shoulder 07/30/2020  . COVID-19 virus infection 10/31/2019  . Abnormal liver function 10/31/2019  . Hypokalemia 10/31/2019  . Hyponatremia 10/31/2019  .  Diabetes mellitus ([place]) 11/13/2017  . Hyperlipidemia 11/13/2017  . Hypertensive disorder 11/13/2017   Past Medical History:  Diagnosis Date  . Diabetes mellitus without complication ([place])   . Hyperlipidemia   . Hypertension    No past surgical history on file. No Known Allergies Prior to Admission medications   Medication Sig Start Date End Date Taking? Authorizing Provider  Accu-Chek FastClix Lancets MISC USE TO CHECK BLOOD SUGAR EVERY DAY 01/24/20  Yes [name], MD  ACCU-CHEK GUIDE  test strip USE TO CHECK BLOOD SUGAR ONCE A DAY 10/25/19  Yes [name], MD  blood glucose meter kit and supplies Dispense based on patient and insurance preference. Once per day - fasting or 2 hrs after meal  Dx: E11.65. 07/25/19  Yes [name], MD  glipiZIDE (GLUCOTROL XL) 10 MG 24 hr tablet TAKE 1 TABLET(10 MG) BY MOUTH TWICE DAILY 12/08/20  Yes [name], MD  metFORMIN (GLUCOPHAGE) 850 MG tablet Take 1 tablet (850 mg total) by mouth 2 (two) times daily with a meal. 12/08/20  Yes [name], MD  NIFEdipine (PROCARDIA XL/NIFEDICAL-XL) 90 MG 24 hr tablet TAKE 1 TABLET(90 MG) BY MOUTH DAILY 12/08/20  Yes [name], MD  pioglitazone (ACTOS) 15 MG tablet TAKE 1 TABLET(15 MG) BY MOUTH DAILY 12/08/20  Yes [name], MD  potassium chloride (KLOR-CON) 10 MEQ tablet Take 2 tablets (20 mEq total) by mouth daily. 08/14/20  Yes [name], MD  potassium chloride SA (KLOR-CON) 20 MEQ tablet Take 1 tablet (20 mEq total) by mouth daily. 07/09/20  Yes [name], MD  simvastatin (ZOCOR) 80 MG tablet Take 1 tablet (80 mg total) by mouth daily at 6 PM. Pt will need to keep his appt for future refills 12/08/20  Yes [name], MD   Social History   Socioeconomic History  . Marital status: Married    Spouse name: Not on file  . Number of children: 2  . Years of education: Not on file  . Highest education level: Not on file  Occupational History  . Occupation: environmental  Tobacco Use  . Smoking status: Never Smoker  . Smokeless tobacco: Never Used  Substance and Sexual Activity  . Alcohol use: Never    Alcohol/week: 0.0 standard drinks  . Drug use: Never  . Sexual activity: Yes  Other Topics Concern  . Not on file  Social History Narrative  . Not on file   Social Determinants of Health   Financial Resource Strain: Not on file  Food Insecurity: Not on file  Transportation Needs: Not on file  Physical Activity: Not on file  Stress:  Not on file  Social Connections: Not on file  Intimate Partner Violence: Not on file    Observations/Objective: Vitals:   12/17/20 1348  Height: 6' (1.829 m)   No distress on phone, speaking in full sentences, appropriate responses.  All questions were answered with understanding of plan expressed.  Assessment and Plan: Type 2 diabetes mellitus with hyperglycemia, without long-term current use of insulin ([place]) - Plan: Basic metabolic panel  -Unfortunately recent med nonadherence, possible difficulty with refills at pharmacy.  Unsure of most recent creatinine and potential need to change from Metformin.  Check a basic metabolic panel next week, then if stable can continue Metformin with repeat labs at office visit in 6 weeks, fructosamine likely at that time plus or minus A1c.  No med changes for now.  Elevated serum creatinine -  Plan: Basic metabolic panel  -Check BMP next week as above  Essential hypertension - Plan: Basic metabolic panel  -Tolerating current regimen of nifedipine.  In office eval in 6 weeks.  Labs as above  Hypokalemia - Plan: Basic metabolic panel  -Off supplementation, check labs next week  Routine screening for STI (sexually transmitted infection) - Plan: GC/Chlamydia probe amp ([place])not at [facility], RPR, HIV Antibody (routine testing w rflx)  -Lab only visit next week  Follow Up Instructions: Lab visit next week, 6-week follow-up with me.   I discussed the assessment and treatment plan with the patient. The patient was provided an opportunity to ask questions and all were answered. The patient agreed with the plan and demonstrated an understanding of the instructions.   The patient was advised to call back or seek an in-person evaluation if the symptoms worsen or if the condition fails to improve as anticipated.  I provided 14 minutes of non-face-to-face time during this encounter.   [name], MD

## 2020-12-17 NOTE — Addendum Note (Signed)
Addended by: Meredith Staggers R on: 12/17/2020 04:39 PM   Modules accepted: Orders

## 2020-12-25 ENCOUNTER — Other Ambulatory Visit (HOSPITAL_COMMUNITY)
Admission: RE | Admit: 2020-12-25 | Discharge: 2020-12-25 | Disposition: A | Discharge status: Home / Self Care | Payer: 59 | Source: Ambulatory Visit | Attending: Family Medicine | Admitting: Family Medicine

## 2020-12-25 ENCOUNTER — Ambulatory Visit (INDEPENDENT_AMBULATORY_CARE_PROVIDER_SITE_OTHER): Payer: PRIVATE HEALTH INSURANCE | Admitting: Family Medicine

## 2020-12-25 ENCOUNTER — Other Ambulatory Visit: Payer: Self-pay

## 2020-12-25 DIAGNOSIS — E876 Hypokalemia: Secondary | ICD-10-CM

## 2020-12-25 DIAGNOSIS — Z113 Encounter for screening for infections with a predominantly sexual mode of transmission: Secondary | ICD-10-CM | POA: Diagnosis present

## 2020-12-25 DIAGNOSIS — R7989 Other specified abnormal findings of blood chemistry: Secondary | ICD-10-CM

## 2020-12-25 DIAGNOSIS — I10 Essential (primary) hypertension: Secondary | ICD-10-CM

## 2020-12-25 DIAGNOSIS — E1165 Type 2 diabetes mellitus with hyperglycemia: Secondary | ICD-10-CM

## 2020-12-25 NOTE — Addendum Note (Signed)
Addended by: Argentina Ponder on: 12/25/2020 04:46 PM   Modules accepted: Orders

## 2020-12-29 LAB — BASIC METABOLIC PANEL
BUN/Creatinine Ratio: 14 (ref 9–20)
BUN: 28 mg/dL — ABNORMAL HIGH (ref 6–24)
CO2: 28 mmol/L (ref 20–29)
Calcium: 9.6 mg/dL (ref 8.7–10.2)
Chloride: 94 mmol/L — ABNORMAL LOW (ref 96–106)
Creatinine, Ser: 2.01 mg/dL — ABNORMAL HIGH (ref 0.76–1.27)
GFR calc Af Amer: 42 mL/min/{1.73_m2} — ABNORMAL LOW (ref >59)
GFR calc non Af Amer: 36 mL/min/{1.73_m2} — ABNORMAL LOW (ref >59)
Glucose: 118 mg/dL — ABNORMAL HIGH (ref 65–99)
Potassium: 3.2 mmol/L — ABNORMAL LOW (ref 3.5–5.2)
Sodium: 139 mmol/L (ref 134–144)

## 2020-12-29 LAB — GC/CHLAMYDIA PROBE AMP (~~LOC~~) NOT AT ARMC
Chlamydia: NEGATIVE
Comment: NEGATIVE
Comment: NORMAL
Neisseria Gonorrhea: NEGATIVE

## 2020-12-29 LAB — HIV ANTIBODY (ROUTINE TESTING W REFLEX): HIV Screen 4th Generation wRfx: NONREACTIVE

## 2020-12-29 LAB — RPR: RPR Ser Ql: NONREACTIVE

## 2020-12-29 LAB — HSV-2 IGG SUPPLEMENTAL TEST: HSV-2 IgG Supplemental Test: POSITIVE — AB

## 2020-12-29 LAB — HSV(HERPES SIMPLEX VRS) I + II AB-IGG
HSV 1 Glycoprotein G Ab, IgG: 48 index — ABNORMAL HIGH (ref 0.00–0.90)
HSV 2 IgG, Type Spec: 1.17 index — ABNORMAL HIGH (ref 0.00–0.90)

## 2021-01-08 ENCOUNTER — Other Ambulatory Visit: Payer: Self-pay | Admitting: Family Medicine

## 2021-01-08 DIAGNOSIS — E785 Hyperlipidemia, unspecified: Secondary | ICD-10-CM

## 2021-02-01 ENCOUNTER — Ambulatory Visit: Payer: PRIVATE HEALTH INSURANCE | Admitting: Family Medicine

## 2021-02-01 ENCOUNTER — Other Ambulatory Visit: Payer: Self-pay

## 2021-02-01 ENCOUNTER — Encounter: Payer: Self-pay | Admitting: Family Medicine

## 2021-02-01 VITALS — BP 139/69 | HR 79 | Temp 98.3°F | Ht 72.0 in | Wt 233.0 lb

## 2021-02-01 DIAGNOSIS — E876 Hypokalemia: Secondary | ICD-10-CM | POA: Diagnosis not present

## 2021-02-01 DIAGNOSIS — R768 Other specified abnormal immunological findings in serum: Secondary | ICD-10-CM | POA: Diagnosis not present

## 2021-02-01 DIAGNOSIS — E1165 Type 2 diabetes mellitus with hyperglycemia: Secondary | ICD-10-CM | POA: Diagnosis not present

## 2021-02-01 DIAGNOSIS — E785 Hyperlipidemia, unspecified: Secondary | ICD-10-CM | POA: Diagnosis not present

## 2021-02-01 MED ORDER — BLOOD GLUCOSE METER KIT: PACK | 0 refills | Status: DC

## 2021-02-01 MED ORDER — SIMVASTATIN 80 MG PO TABS: ORAL_TABLET | ORAL | 1 refills | Status: DC

## 2021-02-01 MED ORDER — POTASSIUM CHLORIDE ER 10 MEQ PO TBCR: 10.0000 meq | EXTENDED_RELEASE_TABLET | Freq: Every day | ORAL | 1 refills | Status: DC

## 2021-02-01 NOTE — Patient Instructions (Addendum)
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 If any new genital rash - be seen right away and we can check different test. I do recommend condom every time.   Restart potassium once per day. No other changes.   If there are problems filling your medicines, let us know right away so we can try to help fix any issues.   I will refer you to eye doctor for diabetes eye test.    Check blood sugar once per day. Either fasting OR 2 hours after meals. See info below for diabetes.  Depending on kidney test, we may need to stop metformin or discuss with kidney specialist. You appear to be due for follow up with kidney specialist - call for appointment:   [facility] Address: [address], [place], [place] [address] Phone: [phone]  If any return of dizziness - be seen here or other medical provider right away.    Type 2 Diabetes Mellitus, Self-Care, Adult Caring for yourself after you have been diagnosed with type 2 diabetes (type 2 diabetes mellitus) means keeping your blood sugar (glucose) under control with a balance of:  Nutrition.  Exercise.  Lifestyle changes.  Medicines or insulin, if needed.  Support from your team of health care providers and others. The following information explains what you need to know to manage your diabetes at home. What are the risks? Having diabetes can put you at risk for other long-term (chronic) conditions, such as heart disease and kidney disease. Your health care provider may prescribe medicines to help prevent complications from diabetes. How to monitor blood glucose  Check your blood glucose every day, as often as told by your health care provider.  Have your A1C (hemoglobin A1C) level checked two or more times a year, or as often as told by your health care provider.  Your health care provider will set personalized treatment goals for you. Generally, the goal of treatment is to maintain the following blood glucose levels: ? Before  meals: 80-130 mg/dL (4.4-7.2 mmol/L). ? After meals: below 180 mg/dL (10 mmol/L). ? A1C level: less than 7%.   How to manage hyperglycemia and hypoglycemia Hyperglycemia symptoms Hyperglycemia, also called high blood glucose, occurs when blood glucose is too high. Make sure you know the early signs of hyperglycemia, such as:  Increased thirst.  Hunger.  Feeling very tired.  Needing to urinate more often than usual.  Blurry vision. Hypoglycemia symptoms Hypoglycemia, also called low blood glucose, occurs with a blood glucose level at or below 70 mg/dL (3.9 mmol/L). Diabetes medicines lower your blood glucose and can cause hypoglycemia. The risk for hypoglycemia increases during or after exercise, during sleep, during illness, and when skipping meals or not eating for a long time (fasting). It is important to know the symptoms of hypoglycemia and treat it right away. Always have a 15-gram rapid-acting carbohydrate snack with you to treat low blood glucose. Family members and close friends should also know the symptoms and understand how to treat hypoglycemia, in case you are not able to treat yourself. Symptoms may include:  Hunger.  Anxiety.  Sweating and feeling clammy.  Dizziness or feeling light-headed.  Sleepiness.  Increased heart rate.  Irritability.  Tingling or numbness around the mouth, lips, or tongue.  Restless sleep. Severe hypoglycemia is when your blood glucose level is at or below 54 mg/dL (3 mmol/L). Severe hypoglycemia is an emergency. Do not wait to see if the symptoms will go away. Get medical help right away.   Call your local emergency services (911 in the [place] Do not drive yourself to the hospital. If you have severe hypoglycemia and you cannot eat or drink, you may need glucagon. A family member or close friend should learn how to check your blood glucose and how to give you glucagon. Ask your health care provider if you need to have an emergency glucagon  kit available. Follow these instructions at home: Medicines  Take diabetes medicines as told by your health care provider. If your health care provider prescribed insulin or diabetes medicines, take them every day.  Do not run out of insulin or other diabetes medicines. Plan ahead so you always have these available.  If you use insulin, adjust your dosage based on your physical activity and what foods you eat. Your health care provider will tell you how to adjust your dosage.  Take over-the-counter and prescription medicines only as told by your health care provider. Eating and drinking What you eat and drink affects your blood glucose and your insulin dosage. Making good choices helps to control your diabetes and prevent other health problems. A healthy meal plan includes eating lean proteins, complex carbohydrates, fresh fruits and vegetables, low-fat dairy products, and healthy fats. Make an appointment to see a registered dietitian to help you create an eating plan that is right for you. Make sure that you:  Follow instructions from your health care provider about eating or drinking restrictions.  Drink enough fluid to keep your urine pale yellow.  Keep a record of the carbohydrates that you eat. Do this by reading food labels and learning the standard serving sizes of foods.  Follow your sick-day plan whenever you cannot eat or drink as usual. Make this plan in advance with your health care provider.   Activity  Stay active. Exercise regularly, as told by your health care provider. This may include: ? Stretching and doing strength exercises, such as yoga or weight lifting, 2 or more times a week. ? Doing 150 minutes or more of moderate-intensity or vigorous-intensity exercise each week. This could be brisk walking, biking, or water aerobics.  Spread out your activity over 3 or more days of the week.  Do not go more than 2 days in a row without doing some kind of physical  activity.  When you start a new exercise or activity, work with your health care provider to adjust your insulin, medicines, or food intake as needed. Lifestyle  Do not use any products that contain nicotine or tobacco, such as cigarettes, e-cigarettes, and chewing tobacco. If you need help quitting, ask your health care provider.  If your health care provider says that alcohol is safe for you, limit how much you use to no more than 1 drink a day for women who are not pregnant and 2 drinks a day for men. In the [place] one drink equals one 12 oz bottle of beer (355 mL), one 5 oz glass of wine (148 mL), or one 1 oz glass of hard liquor (44 mL).  Learn to manage stress. If you need help with this, ask your health care provider. Take care of your body  Keep your immunizations up to date. In addition to getting vaccinations as told by your health care provider, it is recommended that you get vaccinated against the following illnesses: ? The flu (influenza). Get a flu shot every year. ? Pneumonia. ? Hepatitis B.  Schedule an eye exam soon after your diagnosis, and then one time every year   after that.  Check your skin and feet every day for cuts, bruises, redness, blisters, or sores. Schedule a foot exam with your health care provider once every year.  Brush your teeth and gums two times a day, and floss one or more times a day. Visit your dentist one or more times every 6 months.  Maintain a healthy weight.   General instructions  Share your diabetes management plan with people in your workplace, school, and household.  Carry a medical alert card or wear medical alert jewelry.  Keep all follow-up visits as told by your health care provider. This is important. Questions to ask your health care provider  Should I meet with a certified diabetes care and education specialist?  Where can I find a support group for people with diabetes? Where to find more information  American Diabetes  Association (ADA): www.diabetes.org  American Association of Diabetes Care and Education Specialists (ADCES): www.diabeteseducator.org  International Diabetes Federation (IDF): www.idf.org Summary  Caring for yourself after you have been diagnosed with type 2 diabetes (type 2 diabetes mellitus) means keeping your blood sugar (glucose) under control with a balance of nutrition, exercise, lifestyle changes, and medicine.  Check your blood glucose every day, as often as told by your health care provider.  Having diabetes can put you at risk for other long-term (chronic) conditions, such as heart disease and kidney disease. Your health care provider may prescribe medicines to help prevent complications from diabetes.  Share your diabetes management plan with people in your workplace, school, and household.  Keep all follow-up visits as told by your health care provider. This is important. This information is not intended to replace advice given to you by your health care provider. Make sure you discuss any questions you have with your health care provider. Document Revised: 01/13/2020 Document Reviewed: 01/14/2020 Elsevier Patient Education  2021 [employer]     If you have lab work done today you will be contacted with your lab results within the next 2 weeks.  If you have not heard from us then please contact us. The fastest way to get your results is to register for My Chart.   IF you received an x-ray today, you will receive an invoice from [facility]. Please contact [facility] at [phone] with questions or concerns regarding your invoice.   IF you received labwork today, you will receive an invoice from [facility]. Please contact [facility] at [phone] with questions or concerns regarding your invoice.   Our billing staff will not be able to assist you with questions regarding bills from these companies.  You will be contacted with the lab results as soon  as they are available. The fastest way to get your results is to activate your My Chart account. Instructions are located on the last page of this paperwork. If you have not heard from us regarding the results in 2 weeks, please contact this office.     

## 2021-02-01 NOTE — Progress Notes (Signed)
Subjective:  Patient ID: Dylan Abbott, male    DOB: [birth year]  Age: 55 y.o. MRN: [id number]  CC:  Chief Complaint  Patient presents with  . Follow-up    On abnormal STI labs from last OV. PT reports no symptoms of his positive labs since last OV. PT isn't currently fasting.    HPI Clearance [place] presents for   Follow-up from January 7 visit with STI screening.  HIV, RPR, chlamydia, gonorrhea testing nonreactive/negative.  HSV testing included antibodies to both type I HSV as well as type II. No hx of cold sores, cracks in corners, or mouth ulcers.  No known hx of genital rash, blisters, raw area or fissures. Never been diagnosed with herpes. New partner past 4-5 months. Condoms every time.   Hypokalemia: Potassium 3.2 on January 7.  Recommended restarting supplement 20 mEq daily with recheck few weeks. Has not taken any potassium supplements - does not have any.  No CP/palpitations.   Diabetes: Discussed at video visit in December. Complicated by hyperglycemia, CKD. Some med nonadherence in December with question about refills at pharmacy. Creatinine has slightly increased from 1.84 in August to 2.01 January 7. EGFR 42. Current regimen glipizide 10 mg twice daily, Actos 15 mg daily , metformin 850 mg twice daily - compliant with this regimen. Initially stated he is taking meds, then later in visit - not taking Actos - out of med past month, not at pharmacy? No meter - not checking blood sugars.  He is on statin Microalbumin: Normal ratio 05/14/2020 Optho, foot exam, pneumovax: due for optho - referral placed.   Lab Results  Component Value Date   HGBA1C 7.7 (H) 08/14/2020   HGBA1C 9.0 (A) 04/24/2020   HGBA1C 9.7 (H) 11/01/2019   Lab Results  Component Value Date   LDLCALC 93 07/09/2020   CREATININE 2.01 (H) 12/25/2020   Hypertension:  Home readings: BP Readings from Last 3 Encounters:  02/01/21 139/69  08/14/20 119/64  07/09/20 (!) 130/78   Lab Results   Component Value Date   CREATININE 2.01 (H) 12/25/2020      History Patient Active Problem List   Diagnosis Date Noted  . Impingement syndrome of left shoulder 07/30/2020  . COVID-19 virus infection 10/31/2019  . Abnormal liver function 10/31/2019  . Hypokalemia 10/31/2019  . Hyponatremia 10/31/2019  . Diabetes mellitus ([place]) 11/13/2017  . Hyperlipidemia 11/13/2017  . Hypertensive disorder 11/13/2017   Past Medical History:  Diagnosis Date  . Diabetes mellitus without complication ([place])   . Hyperlipidemia   . Hypertension    No past surgical history on file. No Known Allergies Prior to Admission medications   Medication Sig Start Date End Date Taking? Authorizing Provider  Accu-Chek FastClix Lancets MISC USE TO CHECK BLOOD SUGAR EVERY DAY 01/24/20  Yes [name], MD  ACCU-CHEK GUIDE test strip USE TO CHECK BLOOD SUGAR ONCE A DAY 10/25/19  Yes [name], MD  blood glucose meter kit and supplies Dispense based on patient and insurance preference. Once per day - fasting or 2 hrs after meal  Dx: E11.65. 07/25/19  Yes [name], MD  glipiZIDE (GLUCOTROL XL) 10 MG 24 hr tablet TAKE 1 TABLET(10 MG) BY MOUTH TWICE DAILY 12/08/20  Yes [name], MD  metFORMIN (GLUCOPHAGE) 850 MG tablet Take 1 tablet (850 mg total) by mouth 2 (two) times daily with a meal. 12/08/20  Yes [name], MD  NIFEdipine (PROCARDIA XL/NIFEDICAL-XL) 90 MG 24 hr tablet TAKE 1  TABLET(90 MG) BY MOUTH DAILY 12/08/20  Yes [name], MD  pioglitazone (ACTOS) 15 MG tablet TAKE 1 TABLET(15 MG) BY MOUTH DAILY 12/08/20  Yes [name], MD  potassium chloride (KLOR-CON) 10 MEQ tablet Take 2 tablets (20 mEq total) by mouth daily. 08/14/20  Yes [name], MD  potassium chloride SA (KLOR-CON) 20 MEQ tablet Take 1 tablet (20 mEq total) by mouth daily. 07/09/20  Yes [name], MD  simvastatin (ZOCOR) 80 MG tablet TAKE 1 TABLET(80 MG) BY MOUTH DAILY AT 6 PM 01/08/21   Yes [name], MD   Social History   Socioeconomic History  . Marital status: Married    Spouse name: Not on file  . Number of children: 2  . Years of education: Not on file  . Highest education level: Not on file  Occupational History  . Occupation: environmental  Tobacco Use  . Smoking status: Never Smoker  . Smokeless tobacco: Never Used  Substance and Sexual Activity  . Alcohol use: Never    Alcohol/week: 0.0 standard drinks  . Drug use: Never  . Sexual activity: Yes  Other Topics Concern  . Not on file  Social History Narrative  . Not on file   Social Determinants of Health   Financial Resource Strain: Not on file  Food Insecurity: Not on file  Transportation Needs: Not on file  Physical Activity: Not on file  Stress: Not on file  Social Connections: Not on file  Intimate Partner Violence: Not on file    Review of Systems  Constitutional: Negative for fatigue and unexpected weight change.  Eyes: Negative for visual disturbance.  Respiratory: Negative for cough, chest tightness and shortness of breath.   Cardiovascular: Negative for chest pain, palpitations and leg swelling.  Gastrointestinal: Negative for abdominal pain and blood in stool.  Neurological: Negative for dizziness (rare. not current. ), light-headedness and headaches.     Objective:   Vitals:   02/01/21 1548  BP: 139/69  Pulse: 79  Temp: 98.3 F (36.8 C)  TempSrc: Temporal  SpO2: 98%  Weight: 233 lb (105.7 kg)  Height: 6' (1.829 m)     Physical Exam Vitals reviewed.  Constitutional:      Appearance: He is well-developed and well-nourished.  HENT:     Head: Normocephalic and atraumatic.  Eyes:     Extraocular Movements: EOM normal.     Pupils: Pupils are equal, round, and reactive to light.  Neck:     Vascular: No carotid bruit or JVD.  Cardiovascular:     Rate and Rhythm: Normal rate and regular rhythm.     Heart sounds: Normal heart sounds. No murmur  heard.   Pulmonary:     Effort: Pulmonary effort is normal.     Breath sounds: Normal breath sounds. No rales.  Musculoskeletal:        General: No edema.  Skin:    General: Skin is warm and dry.  Neurological:     Mental Status: He is alert and oriented to person, place, and time.  Psychiatric:        Mood and Affect: Mood and affect normal.    48 minutes spent during visit, greater than 50% counseling and assimilation of information, chart review, and discussion of plan.    Assessment & Plan:  Cartier Washko is a 55 y.o. male . Hypokalemia - Plan: potassium chloride (KLOR-CON) 10 MEQ tablet, CANCELED: Basic metabolic panel  Asymptomatic,Restart potassium initially 10 mEq daily, repeat  electrolytes on CMP  Seropositive for herpes simplex 2 infection  -Asymptomatic, denies previous rash.  Discussed potential of positive test after previous exposure without current or future symptoms as well as potential asymptomatic shedding.  Recommended continued strict condom use.  RTC precautions if any new rash for viral swab.  Type 2 diabetes mellitus with hyperglycemia, without long-term current use of insulin (HCC) - Plan: Comprehensive metabolic panel, Lipid panel, Hemoglobin A1c, Ambulatory referral to Ophthalmology, blood glucose meter kit and supplies  -Updated A1c obtained.  Reportedly has been off Actos the past month.  My assistant did clarify refills with the pharmacy and should be available at this time.  Patient plans to pick up medication.  Monitor renal function with metformin - may need to d/c. Refer for retinopathy screen and nephrology follow up recommended.  Hyperlipidemia, unspecified hyperlipidemia type - Plan: simvastatin (ZOCOR) 80 MG tablet  - check labs. Continue same dose statin for now.   Meds ordered this encounter  Medications  . potassium chloride (KLOR-CON) 10 MEQ tablet    Sig: Take 1 tablet (10 mEq total) by mouth daily.    Dispense:  90 tablet    Refill:   1  . blood glucose meter kit and supplies    Sig: Check once per day.    Dispense:  1 each    Refill:  0    Glucometer of choice per insurance coverage. Dx. E11.65    Order Specific Question:   Number of strips    Answer:   100    Order Specific Question:   Number of lancets    Answer:   100  . simvastatin (ZOCOR) 80 MG tablet    Sig: TAKE 1 TABLET(80 MG) BY MOUTH DAILY AT 6 PM    Dispense:  90 tablet    Refill:  1   Patient Instructions    Test for HSV (herpes) antibody was positive. If any new genital rash - be seen right away and we can check different test. I do recommend condom every time.   Restart potassium once per day. No other changes.   If there are problems filling your medicines, let us know right away so we can try to help fix any issues.   I will refer you to eye doctor for diabetes eye test.    Check blood sugar once per day. Either fasting OR 2 hours after meals. See info below for diabetes.  Depending on kidney test, we may need to stop metformin or discuss with kidney specialist. You appear to be due for follow up with kidney specialist - call for appointment:    [facility] Address: [address] [place], [place] [address] Phone: [phone]  If any return of dizziness - be seen here or other medical provider right away.    Type 2 Diabetes Mellitus, Self-Care, Adult Caring for yourself after you have been diagnosed with type 2 diabetes (type 2 diabetes mellitus) means keeping your blood sugar (glucose) under control with a balance of:  Nutrition.  Exercise.  Lifestyle changes.  Medicines or insulin, if needed.  Support from your team of health care providers and others. The following information explains what you need to know to manage your diabetes at home. What are the risks? Having diabetes can put you at risk for other long-term (chronic) conditions, such as heart disease and kidney disease. Your health care provider may prescribe  medicines to help prevent complications from diabetes. How to monitor blood glucose  Check  your blood glucose every day, as often as told by your health care provider.  Have your A1C (hemoglobin A1C) level checked two or more times a year, or as often as told by your health care provider.  Your health care provider will set personalized treatment goals for you. Generally, the goal of treatment is to maintain the following blood glucose levels: ? Before meals: 80-130 mg/dL (4.4-7.2 mmol/L). ? After meals: below 180 mg/dL (10 mmol/L). ? A1C level: less than 7%.   How to manage hyperglycemia and hypoglycemia Hyperglycemia symptoms Hyperglycemia, also called high blood glucose, occurs when blood glucose is too high. Make sure you know the early signs of hyperglycemia, such as:  Increased thirst.  Hunger.  Feeling very tired.  Needing to urinate more often than usual.  Blurry vision. Hypoglycemia symptoms Hypoglycemia, also called low blood glucose, occurs with a blood glucose level at or below 70 mg/dL (3.9 mmol/L). Diabetes medicines lower your blood glucose and can cause hypoglycemia. The risk for hypoglycemia increases during or after exercise, during sleep, during illness, and when skipping meals or not eating for a long time (fasting). It is important to know the symptoms of hypoglycemia and treat it right away. Always have a 15-gram rapid-acting carbohydrate snack with you to treat low blood glucose. Family members and close friends should also know the symptoms and understand how to treat hypoglycemia, in case you are not able to treat yourself. Symptoms may include:  Hunger.  Anxiety.  Sweating and feeling clammy.  Dizziness or feeling light-headed.  Sleepiness.  Increased heart rate.  Irritability.  Tingling or numbness around the mouth, lips, or tongue.  Restless sleep. Severe hypoglycemia is when your blood glucose level is at or below 54 mg/dL (3 mmol/L). Severe  hypoglycemia is an emergency. Do not wait to see if the symptoms will go away. Get medical help right away. Call your local emergency services (911 in the [place] Do not drive yourself to the hospital. If you have severe hypoglycemia and you cannot eat or drink, you may need glucagon. A family member or close friend should learn how to check your blood glucose and how to give you glucagon. Ask your health care provider if you need to have an emergency glucagon kit available. Follow these instructions at home: Medicines  Take diabetes medicines as told by your health care provider. If your health care provider prescribed insulin or diabetes medicines, take them every day.  Do not run out of insulin or other diabetes medicines. Plan ahead so you always have these available.  If you use insulin, adjust your dosage based on your physical activity and what foods you eat. Your health care provider will tell you how to adjust your dosage.  Take over-the-counter and prescription medicines only as told by your health care provider. Eating and drinking What you eat and drink affects your blood glucose and your insulin dosage. Making good choices helps to control your diabetes and prevent other health problems. A healthy meal plan includes eating lean proteins, complex carbohydrates, fresh fruits and vegetables, low-fat dairy products, and healthy fats. Make an appointment to see a registered dietitian to help you create an eating plan that is right for you. Make sure that you:  Follow instructions from your health care provider about eating or drinking restrictions.  Drink enough fluid to keep your urine pale yellow.  Keep a record of the carbohydrates that you eat. Do this by reading food labels and learning the standard serving  sizes of foods.  Follow your sick-day plan whenever you cannot eat or drink as usual. Make this plan in advance with your health care provider.   Activity  Stay active.  Exercise regularly, as told by your health care provider. This may include: ? Stretching and doing strength exercises, such as yoga or weight lifting, 2 or more times a week. ? Doing 150 minutes or more of moderate-intensity or vigorous-intensity exercise each week. This could be brisk walking, biking, or water aerobics.  Spread out your activity over 3 or more days of the week.  Do not go more than 2 days in a row without doing some kind of physical activity.  When you start a new exercise or activity, work with your health care provider to adjust your insulin, medicines, or food intake as needed. Lifestyle  Do not use any products that contain nicotine or tobacco, such as cigarettes, e-cigarettes, and chewing tobacco. If you need help quitting, ask your health care provider.  If your health care provider says that alcohol is safe for you, limit how much you use to no more than 1 drink a day for women who are not pregnant and 2 drinks a day for men. In the [place] one drink equals one 12 oz bottle of beer (355 mL), one 5 oz glass of wine (148 mL), or one 1 oz glass of hard liquor (44 mL).  Learn to manage stress. If you need help with this, ask your health care provider. Take care of your body  Keep your immunizations up to date. In addition to getting vaccinations as told by your health care provider, it is recommended that you get vaccinated against the following illnesses: ? The flu (influenza). Get a flu shot every year. ? Pneumonia. ? Hepatitis B.  Schedule an eye exam soon after your diagnosis, and then one time every year after that.  Check your skin and feet every day for cuts, bruises, redness, blisters, or sores. Schedule a foot exam with your health care provider once every year.  Brush your teeth and gums two times a day, and floss one or more times a day. Visit your dentist one or more times every 6 months.  Maintain a healthy weight.   General instructions  Share your  diabetes management plan with people in your workplace, school, and household.  Carry a medical alert card or wear medical alert jewelry.  Keep all follow-up visits as told by your health care provider. This is important. Questions to ask your health care provider  Should I meet with a certified diabetes care and education specialist?  Where can I find a support group for people with diabetes? Where to find more information  American Diabetes Association (ADA): www.diabetes.org  American Association of Diabetes Care and Education Specialists (ADCES): www.diabeteseducator.org  International Diabetes Federation (IDF): MemberVerification.ca Summary  Caring for yourself after you have been diagnosed with type 2 diabetes (type 2 diabetes mellitus) means keeping your blood sugar (glucose) under control with a balance of nutrition, exercise, lifestyle changes, and medicine.  Check your blood glucose every day, as often as told by your health care provider.  Having diabetes can put you at risk for other long-term (chronic) conditions, such as heart disease and kidney disease. Your health care provider may prescribe medicines to help prevent complications from diabetes.  Share your diabetes management plan with people in your workplace, school, and household.  Keep all follow-up visits as told by your health care provider. This  is important. This information is not intended to replace advice given to you by your health care provider. Make sure you discuss any questions you have with your health care provider. Document Revised: 01/13/2020 Document Reviewed: 01/14/2020 Elsevier Patient Education  2021 [employer].     If you have lab work done today you will be contacted with your lab results within the next 2 weeks.  If you have not heard from [place] then please contact us. The fastest way to get your results is to register for My Chart.   IF you received an x-ray today, you will receive an invoice  from [facility]. Please contact [facility] at [phone] with questions or concerns regarding your invoice.   IF you received labwork today, you will receive an invoice from [facility]. Please contact [facility] at [phone] with questions or concerns regarding your invoice.   Our billing staff will not be able to assist you with questions regarding bills from these companies.  You will be contacted with the lab results as soon as they are available. The fastest way to get your results is to activate your My Chart account. Instructions are located on the last page of this paperwork. If you have not heard from [place] regarding the results in 2 weeks, please contact this office.         Signed, Merri Ray, MD [facility]

## 2021-02-02 LAB — COMPREHENSIVE METABOLIC PANEL
ALT: 12 IU/L (ref 0–44)
AST: 24 IU/L (ref 0–40)
Albumin/Globulin Ratio: 1.6 (ref 1.2–2.2)
Albumin: 4.4 g/dL (ref 3.8–4.9)
Alkaline Phosphatase: 54 IU/L (ref 44–121)
BUN/Creatinine Ratio: 12 (ref 9–20)
BUN: 24 mg/dL (ref 6–24)
Bilirubin Total: 0.5 mg/dL (ref 0.0–1.2)
CO2: 28 mmol/L (ref 20–29)
Calcium: 9.4 mg/dL (ref 8.7–10.2)
Chloride: 95 mmol/L — ABNORMAL LOW (ref 96–106)
Creatinine, Ser: 1.95 mg/dL — ABNORMAL HIGH (ref 0.76–1.27)
GFR calc Af Amer: 43 mL/min/{1.73_m2} — ABNORMAL LOW (ref >59)
GFR calc non Af Amer: 38 mL/min/{1.73_m2} — ABNORMAL LOW (ref >59)
Globulin, Total: 2.7 g/dL (ref 1.5–4.5)
Glucose: 247 mg/dL — ABNORMAL HIGH (ref 65–99)
Potassium: 3.4 mmol/L — ABNORMAL LOW (ref 3.5–5.2)
Sodium: 137 mmol/L (ref 134–144)
Total Protein: 7.1 g/dL (ref 6.0–8.5)

## 2021-02-02 LAB — HEMOGLOBIN A1C
Est. average glucose Bld gHb Est-mCnc: 189 mg/dL
Hgb A1c MFr Bld: 8.2 % — ABNORMAL HIGH (ref 4.8–5.6)

## 2021-02-02 LAB — LIPID PANEL
Chol/HDL Ratio: 3 ratio (ref 0.0–5.0)
Cholesterol, Total: 165 mg/dL (ref 100–199)
HDL: 55 mg/dL (ref >39)
LDL Chol Calc (NIH): 81 mg/dL (ref 0–99)
Triglycerides: 173 mg/dL — ABNORMAL HIGH (ref 0–149)
VLDL Cholesterol Cal: 29 mg/dL (ref 5–40)

## 2021-02-20 ENCOUNTER — Other Ambulatory Visit: Payer: Self-pay | Admitting: Family Medicine

## 2021-02-20 DIAGNOSIS — E785 Hyperlipidemia, unspecified: Secondary | ICD-10-CM

## 2021-05-07 ENCOUNTER — Ambulatory Visit: Payer: Self-pay | Admitting: Family Medicine

## 2021-05-12 ENCOUNTER — Ambulatory Visit: Payer: 59 | Admitting: Family Medicine

## 2021-06-04 ENCOUNTER — Other Ambulatory Visit: Payer: Self-pay | Admitting: Family Medicine

## 2021-06-04 DIAGNOSIS — E1165 Type 2 diabetes mellitus with hyperglycemia: Secondary | ICD-10-CM

## 2021-06-05 IMAGING — DX DG CHEST 1V PORT
1 series · 1 of 1 positions shown · non-contrast
Comparison: 10/28/2019

CLINICAL DATA: HLYLX-YM positivity with fever, initial encounter

EXAM:
PORTABLE CHEST 1 VIEW

[chest ap]
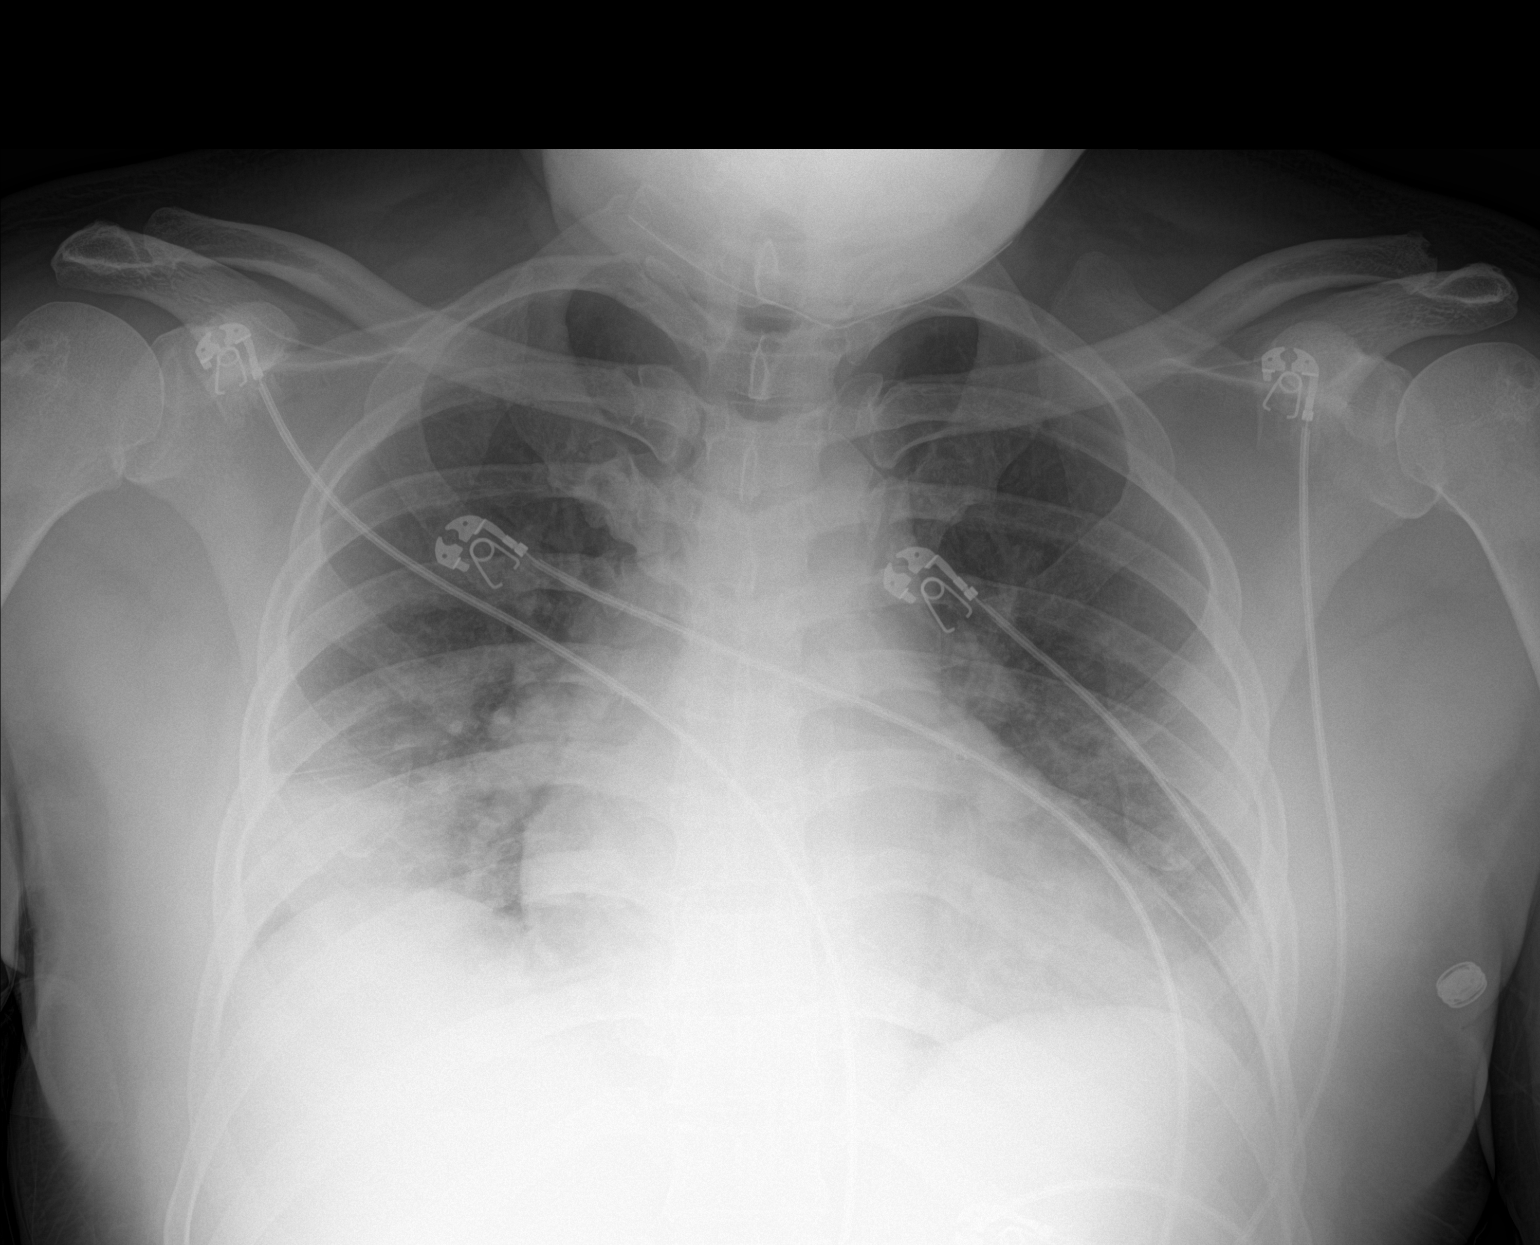

[1 of 1 positions shown; findings below may reference images not displayed]

FINDINGS: Cardiac shadow is enlarged but stable. The lungs are well aerated
bilaterally. Increasing bibasilar airspace opacity is noted
consistent with the given clinical history. No sizable effusion is
noted. No bony abnormality is seen.
IMPRESSION: Increasing airspace opacity in the bases right greater than left
consistent with the HLYLX-YM positivity.

## 2021-08-12 ENCOUNTER — Other Ambulatory Visit: Payer: Self-pay

## 2021-08-12 ENCOUNTER — Ambulatory Visit (INDEPENDENT_AMBULATORY_CARE_PROVIDER_SITE_OTHER): Payer: 59 | Admitting: Family Medicine

## 2021-08-12 ENCOUNTER — Encounter: Payer: Self-pay | Admitting: Family Medicine

## 2021-08-12 VITALS — BP 144/82 | HR 79 | Temp 98.2°F | Resp 16 | Ht 72.0 in | Wt 228.2 lb

## 2021-08-12 DIAGNOSIS — E785 Hyperlipidemia, unspecified: Secondary | ICD-10-CM | POA: Diagnosis not present

## 2021-08-12 DIAGNOSIS — N1832 Chronic kidney disease, stage 3b: Secondary | ICD-10-CM | POA: Diagnosis not present

## 2021-08-12 DIAGNOSIS — E1165 Type 2 diabetes mellitus with hyperglycemia: Secondary | ICD-10-CM | POA: Diagnosis not present

## 2021-08-12 DIAGNOSIS — I10 Essential (primary) hypertension: Secondary | ICD-10-CM

## 2021-08-12 DIAGNOSIS — E876 Hypokalemia: Secondary | ICD-10-CM

## 2021-08-12 LAB — LIPID PANEL
Cholesterol: 172 mg/dL (ref 0–200)
HDL: 54.2 mg/dL (ref >39.00)
LDL Cholesterol: 88 mg/dL (ref 0–99)
NonHDL: 117.41
Total CHOL/HDL Ratio: 3
Triglycerides: 149 mg/dL (ref 0.0–149.0)
VLDL: 29.8 mg/dL (ref 0.0–40.0)

## 2021-08-12 LAB — HEMOGLOBIN A1C: Hgb A1c MFr Bld: 11.6 % — ABNORMAL HIGH (ref 4.6–6.5)

## 2021-08-12 MED ORDER — GLIPIZIDE ER 10 MG PO TB24: ORAL_TABLET | ORAL | 1 refills | Status: DC

## 2021-08-12 MED ORDER — PIOGLITAZONE HCL 15 MG PO TABS: ORAL_TABLET | ORAL | 1 refills | Status: DC

## 2021-08-12 MED ORDER — POTASSIUM CHLORIDE ER 10 MEQ PO TBCR: 10.0000 meq | EXTENDED_RELEASE_TABLET | Freq: Every day | ORAL | 1 refills | Status: DC

## 2021-08-12 MED ORDER — SIMVASTATIN 80 MG PO TABS: ORAL_TABLET | ORAL | 1 refills | Status: DC

## 2021-08-12 MED ORDER — NIFEDIPINE ER OSMOTIC RELEASE 90 MG PO TB24
ORAL_TABLET | ORAL | 1 refills | Status: DC
Start: 2021-08-12 — End: 2022-03-21

## 2021-08-12 MED ORDER — METFORMIN HCL 850 MG PO TABS: 850.0000 mg | ORAL_TABLET | Freq: Two times a day (BID) | ORAL | 1 refills | Status: DC

## 2021-08-12 NOTE — Patient Instructions (Signed)
Continue same medications for now.  I sent refills to your pharmacy.  Depending on your kidney function test we may need to change metformin to a different medication.  I have referred you to an eye specialist.  Follow-up with me in 3 weeks to review your labs and blood pressure.  Can discuss ankle issues further at that time if needed.Return to the clinic or go to the nearest emergency room if any of your symptoms worsen or new symptoms occur.

## 2021-08-12 NOTE — Progress Notes (Signed)
Subjective:  Patient ID: Dylan Abbott, male    DOB: February 18, 1966  Age: 55 y.o. MRN: [id number]  CC:  Chief Complaint  Patient presents with   Hyperlipidemia   Hypertension   Diabetes    Needs refill on supplies so he can check at home    HPI Dylan Abbott presents for   Diabetes: Complicated by hyperglycemia, CKD, previous concern of medication adherence.  Last visit in February.  51-monthfollow-up recommended.  Was advised to restart Actos, and then follow-up in a few weeks to discuss other changes as well as monitoring kidney function and decision on continuance of metformin. Some confusion regarding last office closing.  Last GFR 43 on 02/01/2021. Actos 15 mg daily, metformin 850 mg twice daily, glipizide 10 mg twice daily.  He is on statin -zocor 863mqd. No new myalgias/side effects.  Microalbumin: Normal in 2021. Optho, foot exam, pneumovax: Due for ophthalmology exam, foot exam, urine microalbumin. Sometimes some blurry vision. No n/v/abd pain.  Some R ankle swelling after prolonged work. Feels feet ok.  No home monitoring. No new side effects with meds.  Out of meds for 3-4 days last week.   Diabetic Foot Exam - Simple   No data filed    Lab Results  Component Value Date   HGBA1C 8.2 (H) 02/01/2021   HGBA1C 7.7 (H) 08/14/2020   HGBA1C 9.0 (A) 04/24/2020   Lab Results  Component Value Date   LDLCALC 81 02/01/2021   CREATININE 1.95 (H) 02/01/2021   Hypertension: With ckd prior hypokalemia.  Unsure if taking potassium.  Taking nifedipine 9074maily. No home readings: Urinating normally.  BP Readings from Last 3 Encounters:  08/12/21 (!) 144/82  02/01/21 139/69  08/14/20 119/64   Lab Results  Component Value Date   CREATININE 1.95 (H) 02/01/2021   Lab Results  Component Value Date   K 3.4 (L) 02/01/2021       History Patient Active Problem List   Diagnosis Date Noted   Impingement syndrome of left shoulder 07/30/2020   COVID-19 virus  infection 10/31/2019   Abnormal liver function 10/31/2019   Hypokalemia 10/31/2019   Hyponatremia 10/31/2019   Diabetes mellitus ([place]/26/2018   Hyperlipidemia 11/13/2017   Hypertensive disorder 11/13/2017   Past Medical History:  Diagnosis Date   Diabetes mellitus without complication ([place]  Hyperlipidemia    Hypertension    No past surgical history on file. No Known Allergies Prior to Admission medications   Medication Sig Start Date End Date Taking? Authorizing Provider  glipiZIDE (GLUCOTROL XL) 10 MG 24 hr tablet TAKE 1 TABLET(10 MG) BY MOUTH TWICE DAILY 12/08/20  Yes [name]  metFORMIN (GLUCOPHAGE) 850 MG tablet Take 1 tablet (850 mg total) by mouth 2 (two) times daily with a meal. 12/08/20  Yes [name]  NIFEdipine (PROCARDIA XL/NIFEDICAL-XL) 90 MG 24 hr tablet TAKE 1 TABLET(90 MG) BY MOUTH DAILY 12/08/20  Yes [name]  pioglitazone (ACTOS) 15 MG tablet TAKE 1 TABLET(15 MG) BY MOUTH DAILY 12/08/20  Yes [name]  simvastatin (ZOCOR) 80 MG tablet TAKE 1 TABLET(80 MG) BY MOUTH DAILY AT 6 PM 02/01/21  Yes [name]  Accu-Chek FastClix Lancets MISC USE TO CHESeymourOOD SUGAR EVERY DAY Patient not taking: Reported on 08/12/2021 01/24/20   [name]  ACCU-CHEK GUIDE test strip USE TO CHECK BLOOD SUGAR ONCE A DAY Patient not taking: Reported on 08/12/2021 10/25/19  [name], MD  blood glucose meter kit and supplies Check once per day. Patient not taking: Reported on 08/12/2021 02/01/21   [name], MD  potassium chloride (KLOR-CON) 10 MEQ tablet Take 1 tablet (10 mEq total) by mouth daily. Patient not taking: Reported on 08/12/2021 02/01/21   [name], MD   Social History   Socioeconomic History   Marital status: Married    Spouse name: Not on file   Number of children: 2   Years of education: Not on file   Highest education level: Not on file  Occupational History   Occupation:  environmental  Tobacco Use   Smoking status: Never   Smokeless tobacco: Never  Substance and Sexual Activity   Alcohol use: Never    Alcohol/week: 0.0 standard drinks   Drug use: Never   Sexual activity: Yes  Other Topics Concern   Not on file  Social History Narrative   Not on file   Social Determinants of Health   Financial Resource Strain: Not on file  Food Insecurity: Not on file  Transportation Needs: Not on file  Physical Activity: Not on file  Stress: Not on file  Social Connections: Not on file  Intimate Partner Violence: Not on file    Review of Systems  Constitutional:  Negative for fatigue and unexpected weight change.  Eyes:  Positive for visual disturbance.  Respiratory:  Negative for cough, chest tightness and shortness of breath.   Cardiovascular:  Positive for leg swelling (r ankle after work at times,. nki.). Negative for chest pain and palpitations.  Gastrointestinal:  Negative for abdominal pain and blood in stool.  Neurological:  Negative for dizziness, light-headedness and headaches.    Objective:   Vitals:   08/12/21 1152  BP: (!) 144/82  Pulse: 79  Resp: 16  Temp: 98.2 F (36.8 C)  TempSrc: Temporal  SpO2: 98%  Weight: 228 lb 3.2 oz (103.5 kg)  Height: 6' (1.829 m)     Physical Exam Vitals reviewed.  Constitutional:      Appearance: He is well-developed.  HENT:     Head: Normocephalic and atraumatic.  Neck:     Vascular: No carotid bruit or JVD.  Cardiovascular:     Rate and Rhythm: Normal rate and regular rhythm.     Heart sounds: Normal heart sounds. No murmur heard. Pulmonary:     Effort: Pulmonary effort is normal.     Breath sounds: Normal breath sounds. No rales.  Musculoskeletal:     Right lower leg: No edema.     Left lower leg: No edema.  Skin:    General: Skin is warm and dry.  Neurological:     Mental Status: He is alert and oriented to person, place, and time.  Psychiatric:        Mood and Affect: Mood normal.         Behavior: Behavior normal.       Assessment & Plan:  Dylan Abbott is a 55 y.o. male . Type 2 diabetes mellitus with hyperglycemia, without long-term current use of insulin (HCC) - Plan: COMPLETE METABOLIC PANEL WITH GFR, Hemoglobin A1c, Ambulatory referral to Ophthalmology, HM Diabetes Foot Exam, glipiZIDE (GLUCOTROL XL) 10 MG 24 hr tablet, pioglitazone (ACTOS) 15 MG tablet, metFORMIN (GLUCOPHAGE) 850 MG tablet  -Uncontrolled previously, complicated by CKD, potentially may need to discontinue metformin.  Updated testing with A1c, creatinine/GFR, other labs above.  Recheck next few weeks.  Hyperlipidemia, unspecified hyperlipidemia type - Plan: COMPLETE  METABOLIC PANEL WITH GFR, Lipid panel, simvastatin (ZOCOR) 80 MG tablet  -Tolerating Zocor, check labs, continue same dose  Essential hypertension - Plan: COMPLETE METABOLIC PANEL WITH GFR, NIFEdipine (PROCARDIA XL/NIFEDICAL-XL) 90 MG 24 hr tablet  -Slight elevation in office, recently out of medications.  Recheck next few weeks and off to decide on adjustments, continue nifedipine for now.    Stage 3b chronic kidney disease ([place])  -Updated labs as above, and may need to discontinue metformin, consider nephrology eval.  Hypokalemia - Plan: potassium chloride (KLOR-CON) 10 MEQ tablet We will also check potassium but refilled potassium supplement.  Not sure if he is taking that medication.  Meds ordered this encounter  Medications   glipiZIDE (GLUCOTROL XL) 10 MG 24 hr tablet    Sig: TAKE 1 TABLET(10 MG) BY MOUTH TWICE DAILY    Dispense:  180 tablet    Refill:  1   NIFEdipine (PROCARDIA XL/NIFEDICAL-XL) 90 MG 24 hr tablet    Sig: TAKE 1 TABLET(90 MG) BY MOUTH DAILY    Dispense:  90 tablet    Refill:  1   pioglitazone (ACTOS) 15 MG tablet    Sig: TAKE 1 TABLET(15 MG) BY MOUTH DAILY    Dispense:  90 tablet    Refill:  1   simvastatin (ZOCOR) 80 MG tablet    Sig: TAKE 1 TABLET(80 MG) BY MOUTH DAILY AT 6 PM    Dispense:  90  tablet    Refill:  1   potassium chloride (KLOR-CON) 10 MEQ tablet    Sig: Take 1 tablet (10 mEq total) by mouth daily.    Dispense:  90 tablet    Refill:  1   metFORMIN (GLUCOPHAGE) 850 MG tablet    Sig: Take 1 tablet (850 mg total) by mouth 2 (two) times daily with a meal.    Dispense:  180 tablet    Refill:  1   Patient Instructions  Continue same medications for now.  I sent refills to your pharmacy.  Depending on your kidney function test we may need to change metformin to a different medication.  I have referred you to an eye specialist.  Follow-up with me in 3 weeks to review your labs and blood pressure.  Can discuss ankle issues further at that time if needed.Return to the clinic or go to the nearest emergency room if any of your symptoms worsen or new symptoms occur.     Signed,   Merri Ray, MD [place], [facility] 08/12/21 12:49 PM

## 2021-08-13 LAB — COMPLETE METABOLIC PANEL WITH GFR
AG Ratio: 1.4 (calc) (ref 1.0–2.5)
ALT: 10 U/L (ref 9–46)
AST: 17 U/L (ref 10–35)
Albumin: 3.9 g/dL (ref 3.6–5.1)
Alkaline phosphatase (APISO): 54 U/L (ref 35–144)
BUN/Creatinine Ratio: 9 (calc) (ref 6–22)
BUN: 19 mg/dL (ref 7–25)
CO2: 28 mmol/L (ref 20–32)
Calcium: 9.1 mg/dL (ref 8.6–10.3)
Chloride: 94 mmol/L — ABNORMAL LOW (ref 98–110)
Creat: 2.11 mg/dL — ABNORMAL HIGH (ref 0.70–1.30)
Globulin: 2.8 g/dL (calc) (ref 1.9–3.7)
Glucose, Bld: 476 mg/dL — ABNORMAL HIGH (ref 65–99)
Potassium: 3.7 mmol/L (ref 3.5–5.3)
Sodium: 132 mmol/L — ABNORMAL LOW (ref 135–146)
Total Bilirubin: 0.6 mg/dL (ref 0.2–1.2)
Total Protein: 6.7 g/dL (ref 6.1–8.1)
eGFR: 36 mL/min/{1.73_m2} — ABNORMAL LOW (ref >=60)

## 2021-08-27 ENCOUNTER — Other Ambulatory Visit: Payer: Self-pay

## 2021-08-27 ENCOUNTER — Ambulatory Visit (INDEPENDENT_AMBULATORY_CARE_PROVIDER_SITE_OTHER): Payer: 59 | Admitting: Family Medicine

## 2021-08-27 VITALS — BP 112/66 | HR 71 | Temp 97.8°F | Resp 16 | Ht 72.0 in | Wt 228.2 lb

## 2021-08-27 DIAGNOSIS — E1165 Type 2 diabetes mellitus with hyperglycemia: Secondary | ICD-10-CM

## 2021-08-27 DIAGNOSIS — R739 Hyperglycemia, unspecified: Secondary | ICD-10-CM

## 2021-08-27 DIAGNOSIS — N1832 Chronic kidney disease, stage 3b: Secondary | ICD-10-CM | POA: Diagnosis not present

## 2021-08-27 DIAGNOSIS — R7989 Other specified abnormal findings of blood chemistry: Secondary | ICD-10-CM

## 2021-08-27 LAB — COMPREHENSIVE METABOLIC PANEL
ALT: 17 U/L (ref 0–53)
AST: 22 U/L (ref 0–37)
Albumin: 4.3 g/dL (ref 3.5–5.2)
Alkaline Phosphatase: 61 U/L (ref 39–117)
BUN: 18 mg/dL (ref 6–23)
CO2: 29 mEq/L (ref 19–32)
Calcium: 9.5 mg/dL (ref 8.4–10.5)
Chloride: 93 mEq/L — ABNORMAL LOW (ref 96–112)
Creatinine, Ser: 1.47 mg/dL (ref 0.40–1.50)
GFR: 53.2 mL/min — ABNORMAL LOW (ref >60.00)
Glucose, Bld: 340 mg/dL — ABNORMAL HIGH (ref 70–99)
Potassium: 3.3 mEq/L — ABNORMAL LOW (ref 3.5–5.1)
Sodium: 132 mEq/L — ABNORMAL LOW (ref 135–145)
Total Bilirubin: 0.5 mg/dL (ref 0.2–1.2)
Total Protein: 7.4 g/dL (ref 6.0–8.3)

## 2021-08-27 LAB — GLUCOSE, POCT (MANUAL RESULT ENTRY): POC Glucose: 376 mg/dl — AB (ref 70–99)

## 2021-08-27 MED ORDER — BLOOD GLUCOSE METER KIT: PACK | 0 refills | Status: DC

## 2021-08-27 MED ORDER — PEN NEEDLES 32G X 4 MM MISC: 1.0000 "application " | Freq: Every day | 3 refills | Status: DC

## 2021-08-27 MED ORDER — ACCU-CHEK GUIDE VI STRP: ORAL_STRIP | 0 refills | Status: DC

## 2021-08-27 MED ORDER — LANTUS SOLOSTAR 100 UNIT/ML ~~LOC~~ SOPN: 10.0000 [IU] | PEN_INJECTOR | Freq: Every day | SUBCUTANEOUS | 3 refills | Status: DC

## 2021-08-27 MED ORDER — ACCU-CHEK FASTCLIX LANCETS MISC: 0 refills | Status: DC

## 2021-08-27 NOTE — Patient Instructions (Addendum)
Stop metformin as your last kidney test was too low for that medicine at this time.  I will try to get you an appointment with kidney specialist for follow-up and I am rechecking kidney function today. I prescribed a blood sugar meter, check your blood sugars fasting and another time after meals.  2 times per day or anytime you are not feeling well.  Bring a copy of those readings to your next visit in 1 week.   Start lantus insulin 10 units per day.  no change in glipizide for now.  If you have any questions or problems using the injection - please come back and we can help you.   If any nausea, vomiting, abdominal pain, new lightheadedness, dizziness or headaches to be seen in the emergency room.Return to the clinic or go to the nearest emergency room if any of your symptoms worsen or new symptoms occur.  Insulin Injection Instructions, Using Insulin Pens, Adult There are many different types of insulin. The type of insulin that you take may determine how many injections you give yourself and when you need to give the injections. Supplies needed: Soap and water. Your insulin pen. A new needle. Alcohol wipes. A disposal container for sharp items (sharps container), such as an empty plastic bottle with a cover. How to choose a site for injection The body absorbs insulin differently, depending on where the insulin is injected (injection site). It is best to inject insulin into the same body area each time; for example, always in the abdomen. However, you should use a different spot in that area for each injection. Do not inject the insulin in the same spot each time. There are five main areas that can be used for injecting. These areas are: Abdomen. This is the preferred area. Front of thigh. Upper, outer side of thigh. Upper, outer side of arm. Upper, outer part of buttock. How to use an insulin pen Get ready Wash your hands with soap and water for at least 20 seconds. If soap and water are  not available, use hand sanitizer. Test your blood sugar (glucose) level and write down that number. Follow any instructions from your health care provider about what to do if your blood glucose level is higher or lower than your normal range. Make sure the insulin pen has the right kind of insulin and there is enough insulin in the pen for the dose. Check the expiration date. If you are using CLEAR insulin, check to see that it is clear and free of clumps. If you are using CLOUDY insulin, mix it by gently rolling the insulin pen between your palms several times. Do not shake the pen. Remove the cap from the insulin pen. Use an alcohol wipe to clean the rubber tip of the pen. Remove the protective paper tab from the disposable needle. Do not let the needle touch anything. Screw a new, unused needle onto the pen. Remove the outer plastic needle cover. Do not throw away the outer plastic cover yet. If the pen uses a special safety needle, leave the inner needle shield in place. If the pen does not use a special safety needle, remove the inner plastic cover from the needle. If you skip this step, you may not get the right amount of insulin. Follow the manufacturer's instructions to prime the insulin pen with the volume of insulin needed. Hold the pen with the needle pointing up, and push the button on the opposite end of the pen until a  drop of insulin appears at the needle tip. If no insulin appears, repeat this step. Turn the button (dial) to the number of units of insulin that you will be injecting. Inject the insulin Use an alcohol wipe to clean the site where you will be inserting the needle. Let the site air-dry. Grip the base of the pen with a loose fist and rest your thumb on the pen or hold the pen in the palm of your writing hand like a pencil. If directed by your health care provider, use your other hand to pinch and hold about 1 inch (2.5 cm) of skin at the injection site. Do not directly  touch the cleaned part of the skin. Gently but quickly, use your writing hand to put the needle straight into the skin. Insert the needle at a 45-degree angle or a 90-degree angle (perpendicular) to the skin, as directed by your health care provider. When the needle is completely inserted into the skin, let go of the skin that you are pinching. Use your thumb or index finger of your writing hand to push the top button of the pen all the way to inject the insulin. Continue to hold the pen in place with your writing hand. Wait 10 seconds, then pull the needle straight out of the skin. This will allow all of the insulin to go from the pen and needle into your body. Carefully put the outer plastic cover of the needle back over the needle, then unscrew the capped needle and discard it in a sharps container, such as an empty plastic bottle with a cover. Put the plastic cap back on the insulin pen. How to throw away supplies Discard all used needles in a sharps container. Follow the disposal regulations for the area where you live. Do not use any needle more than one time. Throw away empty disposable pens in the regular trash. Questions to ask your health care provider How often should I be taking insulin? How often should I check my blood glucose? What amount of insulin should I be taking each time? What are the side effects? What should I do if my blood glucose is too high? What should I do if my blood glucose is too low? What should I do if I forget to take my insulin? What number should I call if I have questions? Where to find more information American Diabetes Association (ADA): www.diabetes.org Association of Diabetes Care and Education Specialists (ADCES): www.diabeteseducator.org Summary Before you give yourself an insulin injection, be sure to wash your hands for at least 20 seconds and test your blood glucose level. Write down that number. Check the expiration date and the type of  insulin that is in the pen. The type of insulin that you take may determine how many injections you give yourself and when you need to give the injections. It is best to inject insulin into the same body area each time; for example, always in the abdomen. However, you should use a different spot in that area for each injection. Do not use a needle more than one time. This information is not intended to replace advice given to you by your health care provider. Make sure you discuss any questions you have with your health care provider. Document Revised: 02/22/2021 Document Reviewed: 10/03/2020 Elsevier Patient Education  2022 [employer].

## 2021-08-27 NOTE — Progress Notes (Signed)
Subjective:  Patient ID: Dylan Abbott, male    DOB: Nov 18, 1966  Age: 55 y.o. MRN: [id number]  CC:  Chief Complaint  Patient presents with   Diabetes    Recheck now that pt has restarted medications, still notes he is not checking BG at home     HPI Dylan Abbott presents for   Diabetes: Hyperglycemia, CKD, medication nonadherence. Last visit August 25.  Actos 15 mg daily, metformin 850 mg twice daily, glipizide 10 mg twice daily but had been out of medication temporarily.  He is on statin with Zocor.  There was some confusion after his previous visit in February as recommended close follow-up regarding his kidney function and continued use of metformin.  GFR 43 in February. Referred to nephrology in 2020. Dr. Moshe Cipro, no recent OV.   Uncontrolled with A1c 11.6 on August 25, up from 8.2 in February.  Glucose 476 last visit but bicarb normal at 28.  EGFR 36.  Feeling ok. Slight blurry vision past few months. No n/v or abdominal pain.  Brief episode of lightheadedness this morning that has resolved.  No focal weakness. Eating and drinking ok. On further discussion reports he had been out of meds for a week or two prior to last visit. Taking metformin twice per day - no missed doses since last visit. Glipizide twice per day. Not checking blood sugars. Needs meter.  Has not seen nephrology since 2020, 88-monthfollow-up planned.   Lab Results  Component Value Date   HGBA1C 11.6 (H) 08/12/2021   HGBA1C 8.2 (H) 02/01/2021   HGBA1C 7.7 (H) 08/14/2020   Lab Results  Component Value Date   LDLCALC 88 08/12/2021   CREATININE 2.11 (H) 08/12/2021    Results for orders placed or performed in visit on 08/27/21  POCT glucose (manual entry)  Result Value Ref Range   POC Glucose 376 (A) 70 - 99 mg/dl      History Patient Active Problem List   Diagnosis Date Noted   Impingement syndrome of left shoulder 07/30/2020   COVID-19 virus infection 10/31/2019   Abnormal liver  function 10/31/2019   Hypokalemia 10/31/2019   Hyponatremia 10/31/2019   Diabetes mellitus ([place] 11/13/2017   Hyperlipidemia 11/13/2017   Hypertensive disorder 11/13/2017   Past Medical History:  Diagnosis Date   Diabetes mellitus without complication ([facility] [place]    Hyperlipidemia    Hypertension    No past surgical history on file. No Known Allergies Prior to Admission medications   Medication Sig Start Date End Date Taking? Authorizing Provider  Accu-Chek FastClix Lancets MISC USE TO CHECK BLOOD SUGAR EVERY DAY 01/24/20  Yes GWendie Agreste MD  ACCU-CHEK GUIDE test strip USE TO CHECK BLOOD SUGAR ONCE A DAY 10/25/19  Yes GWendie Agreste MD  blood glucose meter kit and supplies Check once per day. 02/01/21  Yes GWendie Agreste MD  glipiZIDE (GLUCOTROL XL) 10 MG 24 hr tablet TAKE 1 TABLET(10 MG) BY MOUTH TWICE DAILY 08/12/21  Yes GWendie Agreste MD  metFORMIN (GLUCOPHAGE) 850 MG tablet Take 1 tablet (850 mg total) by mouth 2 (two) times daily with a meal. 08/12/21  Yes GWendie Agreste MD  NIFEdipine (PROCARDIA XL/NIFEDICAL-XL) 90 MG 24 hr tablet TAKE 1 TABLET(90 MG) BY MOUTH DAILY 08/12/21  Yes GWendie Agreste MD  pioglitazone (ACTOS) 15 MG tablet TAKE 1 TABLET(15 MG) BY MOUTH DAILY 08/12/21  Yes GWendie Agreste MD  potassium chloride (KLOR-CON) 10 MEQ tablet Take 1 tablet (10 mEq total)  by mouth daily. 08/12/21  Yes [name], MD  simvastatin (ZOCOR) 80 MG tablet TAKE 1 TABLET(80 MG) BY MOUTH DAILY AT 6 PM 08/12/21  Yes [name], MD   Social History   Socioeconomic History   Marital status: Married    Spouse name: Not on file   Number of children: 2   Years of education: Not on file   Highest education level: Not on file  Occupational History   Occupation: environmental  Tobacco Use   Smoking status: Never   Smokeless tobacco: Never  Substance and Sexual Activity   Alcohol use: Never    Alcohol/week: 0.0 standard drinks   Drug use: Never   Sexual  activity: Yes  Other Topics Concern   Not on file  Social History Narrative   Not on file   Social Determinants of Health   Financial Resource Strain: Not on file  Food Insecurity: Not on file  Transportation Needs: Not on file  Physical Activity: Not on file  Stress: Not on file  Social Connections: Not on file  Intimate Partner Violence: Not on file    Review of Systems Per HPI.   Objective:   Vitals:   08/27/21 1126  BP: 112/66  Pulse: 71  Resp: 16  Temp: 97.8 F (36.6 C)  TempSrc: Temporal  SpO2: 97%  Weight: 228 lb 3.2 oz (103.5 kg)  Height: 6' (1.829 m)     Physical Exam Vitals reviewed.  Constitutional:      General: He is not in acute distress.    Appearance: Normal appearance. He is well-developed. He is not ill-appearing, toxic-appearing or diaphoretic.  HENT:     Head: Normocephalic and atraumatic.  Neck:     Vascular: No carotid bruit or JVD.  Cardiovascular:     Rate and Rhythm: Normal rate and regular rhythm.     Heart sounds: Normal heart sounds. No murmur heard. Pulmonary:     Effort: Pulmonary effort is normal. No respiratory distress.     Breath sounds: Normal breath sounds. No rales.  Abdominal:     General: There is no distension.     Tenderness: There is no abdominal tenderness.  Musculoskeletal:     Right lower leg: No edema.     Left lower leg: No edema.  Skin:    General: Skin is warm and dry.  Neurological:     Mental Status: He is alert and oriented to person, place, and time.  Psychiatric:        Mood and Affect: Mood normal.    Results for orders placed or performed in visit on 08/27/21  POCT glucose (manual entry)  Result Value Ref Range   POC Glucose 376 (A) 70 - 99 mg/dl     Assessment & Plan:  Dylan Abbott is a 55 y.o. male . Type 2 diabetes mellitus with hyperglycemia, without long-term current use of insulin (HCC) - Plan: Comprehensive metabolic panel, POCT glucose (manual entry), blood glucose meter kit  and supplies, glucose blood (ACCU-CHEK GUIDE) test strip, Accu-Chek FastClix Lancets MISC, Ambulatory referral to Nephrology, insulin glargine (LANTUS SOLOSTAR) 100 UNIT/ML Solostar Pen, Insulin Pen Needle (PEN NEEDLES) 32G X 4 MM MISC  Stage 3b chronic kidney disease ([place]) - Plan: Ambulatory referral to Nephrology  Elevated serum creatinine - Plan: Ambulatory referral to Nephrology  Hyperglycemia  Uncontrolled diabetes, even with restarting medication.  Appears to be stable for initial outpatient treatment this time with ER precautions given.   -Stop metformin given  most recent renal function.  Repeat referral to nephrology as he has not been seen recently and increased creatinine.  Repeat labs today.  -Start Lantus, initially 10 units/day.  Gaffer today with understanding expressed.  Handout given on use as well with option of nurse visit if further teaching needed. - Continue glipizide same dose for now with hypoglycemia precautions.  Recheck 1 week.  ER precautions given. Meds ordered this encounter  Medications   blood glucose meter kit and supplies    Sig: Check once per day.    Dispense:  1 each    Refill:  0    Glucometer of choice per insurance coverage. Dx. E11.65 Check 2 times per day - fasting or 2 hours after meals.    Order Specific Question:   Number of strips    Answer:   100    Order Specific Question:   Number of lancets    Answer:   100   glucose blood (ACCU-CHEK GUIDE) test strip    Sig: USE TO CHECK BLOOD SUGAR twice per  DAY Dispense with meter of choice.    Dispense:  100 strip    Refill:  0   Accu-Chek FastClix Lancets MISC    Sig: USE TO CHECK BLOOD SUGAR EVERY DAY    Dispense:  102 each    Refill:  0   insulin glargine (LANTUS SOLOSTAR) 100 UNIT/ML Solostar Pen    Sig: Inject 10 Units into the skin daily.    Dispense:  15 mL    Refill:  3   Insulin Pen Needle (PEN NEEDLES) 32G X 4 MM MISC    Sig: 1 application by Does not  apply route daily.    Dispense:  90 each    Refill:  3   Patient Instructions  Stop metformin as your last kidney test was too low for that medicine at this time.  I will try to get you an appointment with kidney specialist for follow-up and I am rechecking kidney function today. I prescribed a blood sugar meter, check your blood sugars fasting and another time after meals.  2 times per day or anytime you are not feeling well.  Bring a copy of those readings to your next visit in 1 week.   Start lantus insulin 10 units per day.  no change in glipizide for now.  If you have any questions or problems using the injection - please come back and we can help you.   If any nausea, vomiting, abdominal pain, new lightheadedness, dizziness or headaches to be seen in the emergency room.Return to the clinic or go to the nearest emergency room if any of your symptoms worsen or new symptoms occur.  Insulin Injection Instructions, Using Insulin Pens, Adult There are many different types of insulin. The type of insulin that you take may determine how many injections you give yourself and when you need to give the injections. Supplies needed: Soap and water. Your insulin pen. A new needle. Alcohol wipes. A disposal container for sharp items (sharps container), such as an empty plastic bottle with a cover. How to choose a site for injection The body absorbs insulin differently, depending on where the insulin is injected (injection site). It is best to inject insulin into the same body area each time; for example, always in the abdomen. However, you should use a different spot in that area for each injection. Do not inject the insulin in the same spot each time.  There are five main areas that can be used for injecting. These areas are: Abdomen. This is the preferred area. Front of thigh. Upper, outer side of thigh. Upper, outer side of arm. Upper, outer part of buttock. How to use an insulin pen Get  ready Wash your hands with soap and water for at least 20 seconds. If soap and water are not available, use hand sanitizer. Test your blood sugar (glucose) level and write down that number. Follow any instructions from your health care provider about what to do if your blood glucose level is higher or lower than your normal range. Make sure the insulin pen has the right kind of insulin and there is enough insulin in the pen for the dose. Check the expiration date. If you are using CLEAR insulin, check to see that it is clear and free of clumps. If you are using CLOUDY insulin, mix it by gently rolling the insulin pen between your palms several times. Do not shake the pen. Remove the cap from the insulin pen. Use an alcohol wipe to clean the rubber tip of the pen. Remove the protective paper tab from the disposable needle. Do not let the needle touch anything. Screw a new, unused needle onto the pen. Remove the outer plastic needle cover. Do not throw away the outer plastic cover yet. If the pen uses a special safety needle, leave the inner needle shield in place. If the pen does not use a special safety needle, remove the inner plastic cover from the needle. If you skip this step, you may not get the right amount of insulin. Follow the manufacturer's instructions to prime the insulin pen with the volume of insulin needed. Hold the pen with the needle pointing up, and push the button on the opposite end of the pen until a drop of insulin appears at the needle tip. If no insulin appears, repeat this step. Turn the button (dial) to the number of units of insulin that you will be injecting. Inject the insulin Use an alcohol wipe to clean the site where you will be inserting the needle. Let the site air-dry. Grip the base of the pen with a loose fist and rest your thumb on the pen or hold the pen in the palm of your writing hand like a pencil. If directed by your health care provider, use your other  hand to pinch and hold about 1 inch (2.5 cm) of skin at the injection site. Do not directly touch the cleaned part of the skin. Gently but quickly, use your writing hand to put the needle straight into the skin. Insert the needle at a 45-degree angle or a 90-degree angle (perpendicular) to the skin, as directed by your health care provider. When the needle is completely inserted into the skin, let go of the skin that you are pinching. Use your thumb or index finger of your writing hand to push the top button of the pen all the way to inject the insulin. Continue to hold the pen in place with your writing hand. Wait 10 seconds, then pull the needle straight out of the skin. This will allow all of the insulin to go from the pen and needle into your body. Carefully put the outer plastic cover of the needle back over the needle, then unscrew the capped needle and discard it in a sharps container, such as an empty plastic bottle with a cover. Put the plastic cap back on the insulin pen. How to throw  away supplies Discard all used needles in a sharps container. Follow the disposal regulations for the area where you live. Do not use any needle more than one time. Throw away empty disposable pens in the regular trash. Questions to ask your health care provider How often should I be taking insulin? How often should I check my blood glucose? What amount of insulin should I be taking each time? What are the side effects? What should I do if my blood glucose is too high? What should I do if my blood glucose is too low? What should I do if I forget to take my insulin? What number should I call if I have questions? Where to find more information American Diabetes Association (ADA): www.diabetes.org Association of Diabetes Care and Education Specialists (ADCES): www.diabeteseducator.org Summary Before you give yourself an insulin injection, be sure to wash your hands for at least 20 seconds and test your  blood glucose level. Write down that number. Check the expiration date and the type of insulin that is in the pen. The type of insulin that you take may determine how many injections you give yourself and when you need to give the injections. It is best to inject insulin into the same body area each time; for example, always in the abdomen. However, you should use a different spot in that area for each injection. Do not use a needle more than one time. This information is not intended to replace advice given to you by your health care provider. Make sure you discuss any questions you have with your health care provider. Document Revised: 02/22/2021 Document Reviewed: 10/03/2020 Elsevier Patient Education  2022 [place],   Merri Ray, MD [place] 08/27/21 1:28 PM

## 2021-09-03 ENCOUNTER — Ambulatory Visit: Payer: 59 | Admitting: Family Medicine

## 2021-09-08 ENCOUNTER — Encounter: Payer: Self-pay | Admitting: Family Medicine

## 2021-09-08 ENCOUNTER — Other Ambulatory Visit: Payer: Self-pay

## 2021-09-08 ENCOUNTER — Ambulatory Visit (INDEPENDENT_AMBULATORY_CARE_PROVIDER_SITE_OTHER): Payer: 59 | Admitting: Family Medicine

## 2021-09-08 VITALS — BP 140/82 | HR 74 | Temp 98.3°F | Resp 16 | Ht 72.0 in | Wt 222.8 lb

## 2021-09-08 DIAGNOSIS — E1165 Type 2 diabetes mellitus with hyperglycemia: Secondary | ICD-10-CM | POA: Diagnosis not present

## 2021-09-08 DIAGNOSIS — E876 Hypokalemia: Secondary | ICD-10-CM | POA: Diagnosis not present

## 2021-09-08 DIAGNOSIS — N1832 Chronic kidney disease, stage 3b: Secondary | ICD-10-CM

## 2021-09-08 LAB — GLUCOSE, POCT (MANUAL RESULT ENTRY): POC Glucose: 227 mg/dl — AB (ref 70–99)

## 2021-09-08 MED ORDER — METFORMIN HCL 850 MG PO TABS: 850.0000 mg | ORAL_TABLET | Freq: Two times a day (BID) | ORAL | 1 refills | Status: DC

## 2021-09-08 MED ORDER — POTASSIUM CHLORIDE ER 10 MEQ PO TBCR: 10.0000 meq | EXTENDED_RELEASE_TABLET | Freq: Every day | ORAL | 1 refills | Status: DC

## 2021-09-08 MED ORDER — ACCU-CHEK FASTCLIX LANCETS MISC: 0 refills | Status: DC

## 2021-09-08 MED ORDER — GLIPIZIDE 10 MG PO TABS: 10.0000 mg | ORAL_TABLET | Freq: Two times a day (BID) | ORAL | 1 refills | Status: DC

## 2021-09-08 MED ORDER — BLOOD GLUCOSE METER KIT: PACK | 0 refills | Status: DC

## 2021-09-08 NOTE — Patient Instructions (Addendum)
Kidney function better last visit. I will recheck again today. Can restart metformin once per day for now. Check blood sugars and if remains higher than 200 in next week, we may need to start insulin. Follow up in 1 week.   Take potassium daily. Will recheck labs in 1 week.

## 2021-09-08 NOTE — Progress Notes (Signed)
Subjective:  Patient ID: Dylan Abbott, male    DOB: 1966/03/22  Age: 55 y.o. MRN: [id number]  CC:  Chief Complaint  Patient presents with   Diabetes    Pt here for recheck on medications stopped metformin, unable to obtain latus until recently due to cost would like [place] to help how to use the pen, has not started taking glucose reports no meter provided from pharmacy, Nephrology has not called     HPI Dylan Abbott presents for   Diabetes: With history of CKD, hyperglycemia, medication nonadherence.  Last visit September 9.  Some confusion about previous medications at his prior visit.  GFR 43 previously, close monitoring of kidney function and metformin use discussed.  Uncontrolled diabetes with A1c of 11.6 on August 25, up from 8.2 in February.  As of his September 9 visit he was taking glipizide twice daily, metformin twice per day.  He was not checking his home blood sugars, reported needing a meter.  He also had not followed up with nephrology since 2020, and a 18-monthfollow-up was planned at that time.  Renal function had worsened with creatinine 2.11 on August 25, advised him to stop taking metformin.  Repeat referral to nephrology placed.  Repeat creatinine was improved last visit at 1.47 (GFR 53).  He was started on Lantus 10 units/day initially, teaching performed in office with understanding of plan and dispensing discussed.  Continue on glipizide with hypoglycemia precautions.  Lantus was cost prohibitive, but he did purchase one pen out of pocket.  $84.00. has brought with him today.  Has not yet used it.  Was unable to obtain a meter. Taking glipizide 161mBID. No n/v/abd pain/blurry vision.   Not taking potassium - never filled. Some muscle tremors at times. No seizures, no CP/palpitations.  Lab Results  Component Value Date   K 3.3 (L) 08/27/2021    Results for orders placed or performed in visit on 09/08/21  POCT glucose (manual entry)  Result Value Ref Range    POC Glucose 227 (A) 70 - 99 mg/dl   Lab Results  Component Value Date   HGBA1C 11.6 (H) 08/12/2021   HGBA1C 8.2 (H) 02/01/2021   HGBA1C 7.7 (H) 08/14/2020   Lab Results  Component Value Date   LDLCALC 88 08/12/2021   CREATININE 1.47 08/27/2021     History Patient Active Problem List   Diagnosis Date Noted   Impingement syndrome of left shoulder 07/30/2020   COVID-19 virus infection 10/31/2019   Abnormal liver function 10/31/2019   Hypokalemia 10/31/2019   Hyponatremia 10/31/2019   Diabetes mellitus ([facility]/26/2018   Hyperlipidemia 11/13/2017   Hypertensive disorder 11/13/2017   Past Medical History:  Diagnosis Date   Diabetes mellitus without complication ([facility]   Hyperlipidemia    Hypertension    No past surgical history on file. No Known Allergies Prior to Admission medications   Medication Sig Start Date End Date Taking? Authorizing Provider  glipiZIDE (GLUCOTROL XL) 10 MG 24 hr tablet TAKE 1 TABLET(10 MG) BY MOUTH TWICE DAILY 08/12/21  Yes GrWendie AgresteMD  Insulin Pen Needle (PEN NEEDLES) 32G X 4 MM MISC 1 application by Does not apply route daily. 08/27/21  Yes GrWendie AgresteMD  NIFEdipine (PROCARDIA XL/NIFEDICAL-XL) 90 MG 24 hr tablet TAKE 1 TABLET(90 MG) BY MOUTH DAILY 08/12/21  Yes GrWendie AgresteMD  pioglitazone (ACTOS) 15 MG tablet TAKE 1 TABLET(15 MG) BY MOUTH DAILY 08/12/21  Yes GrWendie AgresteMD  potassium chloride (KLOR-CON) 10 MEQ tablet Take 1 tablet (10 mEq total) by mouth daily. 08/12/21  Yes [name], MD  simvastatin (ZOCOR) 80 MG tablet TAKE 1 TABLET(80 MG) BY MOUTH DAILY AT 6 PM 08/12/21  Yes [name], MD  Accu-Chek FastClix Lancets MISC USE TO CHECK BLOOD SUGAR EVERY DAY Patient not taking: Reported on 09/08/2021 08/27/21   [name], MD  blood glucose meter kit and supplies Check once per day. Patient not taking: Reported on 09/08/2021 08/27/21   [name], MD  glucose blood (ACCU-CHEK GUIDE) test strip USE  TO CHECK BLOOD SUGAR twice per  DAY Dispense with meter of choice. Patient not taking: Reported on 09/08/2021 08/27/21   [name], MD  insulin glargine (LANTUS SOLOSTAR) 100 UNIT/ML Solostar Pen Inject 10 Units into the skin daily. Patient not taking: Reported on 09/08/2021 08/27/21   [name], MD  metFORMIN (GLUCOPHAGE) 850 MG tablet Take 1 tablet (850 mg total) by mouth 2 (two) times daily with a meal. Patient not taking: Reported on 09/08/2021 08/12/21   [name], MD   Social History   Socioeconomic History   Marital status: Married    Spouse name: Not on file   Number of children: 2   Years of education: Not on file   Highest education level: Not on file  Occupational History   Occupation: environmental  Tobacco Use   Smoking status: Never   Smokeless tobacco: Never  Substance and Sexual Activity   Alcohol use: Never    Alcohol/week: 0.0 standard drinks   Drug use: Never   Sexual activity: Yes  Other Topics Concern   Not on file  Social History Narrative   Not on file   Social Determinants of Health   Financial Resource Strain: Not on file  Food Insecurity: Not on file  Transportation Needs: Not on file  Physical Activity: Not on file  Stress: Not on file  Social Connections: Not on file  Intimate Partner Violence: Not on file    Review of Systems Per HPI  Objective:   Vitals:   09/08/21 1445  BP: 140/82  Pulse: 74  Resp: 16  Temp: 98.3 F (36.8 C)  TempSrc: Temporal  SpO2: 98%  Weight: 222 lb 12.8 oz (101.1 kg)  Height: 6' (1.829 m)   Physical Exam Vitals reviewed.  Constitutional:      Appearance: He is well-developed.  HENT:     Head: Normocephalic and atraumatic.  Neck:     Vascular: No carotid bruit or JVD.  Cardiovascular:     Rate and Rhythm: Normal rate and regular rhythm.     Heart sounds: Normal heart sounds. No murmur heard. Pulmonary:     Effort: Pulmonary effort is normal.     Breath sounds: Normal breath  sounds. No rales.  Musculoskeletal:     Right lower leg: No edema.     Left lower leg: No edema.  Skin:    General: Skin is warm and dry.  Neurological:     Mental Status: He is alert and oriented to person, place, and time.  Psychiatric:        Mood and Affect: Mood normal.       Assessment & Plan:  Davy Westmoreland is a 55 y.o. male . Type 2 diabetes mellitus with hyperglycemia, without long-term current use of insulin (HCC) - Plan: POCT glucose (manual entry), Microalbumin / creatinine urine ratio, blood glucose meter kit and supplies, Accu-Chek  FastClix Lancets MISC, glipiZIDE (GLUCOTROL) 10 MG tablet, metFORMIN (GLUCOPHAGE) 850 MG tablet  Hypokalemia - Plan: Basic metabolic panel, potassium chloride (KLOR-CON) 10 MEQ tablet  Stage 3b chronic kidney disease ([place])  In office glucose elevated but significantly improved from previous readings, and only on glipizide at this time.  With improved renal function will restart metformin, check BMP today as well as potentially recheck in 1 week for stability.  Planned nephrology follow-up.  Change glipizide to short acting as he is taking it twice per day.  Hold on insulin at this time with repeat evaluation in 1 week, repeat order for blood glucose meter and printed prescription for him to take to pharmacy.  RTC/ER precautions.  Restart potassium 10 mEq daily for now.  Meds ordered this encounter  Medications   blood glucose meter kit and supplies    Sig: Check once per day.    Dispense:  1 each    Refill:  0    Glucometer of choice per insurance coverage. Dx. E11.65 Check 2 times per day - fasting or 2 hours after meals.    Order Specific Question:   Number of strips    Answer:   100    Order Specific Question:   Number of lancets    Answer:   100   Accu-Chek FastClix Lancets MISC    Sig: USE TO CHECK BLOOD SUGAR EVERY DAY    Dispense:  102 each    Refill:  0   glipiZIDE (GLUCOTROL) 10 MG tablet    Sig: Take 1 tablet (10 mg  total) by mouth 2 (two) times daily before a meal.    Dispense:  180 tablet    Refill:  1   metFORMIN (GLUCOPHAGE) 850 MG tablet    Sig: Take 1 tablet (850 mg total) by mouth 2 (two) times daily with a meal.    Dispense:  180 tablet    Refill:  1   potassium chloride (KLOR-CON) 10 MEQ tablet    Sig: Take 1 tablet (10 mEq total) by mouth daily.    Dispense:  90 tablet    Refill:  1   Patient Instructions  Kidney function better last visit. I will recheck again today. Can restart metformin once per day for now. Check blood sugars and if remains higher than 200 in next week, we may need to start insulin. Follow up in 1 week.   Take potassium daily. Will recheck labs in 1 week.     Signed,   Merri Ray, MD [place] 09/08/21 4:08 PM

## 2021-09-09 LAB — BASIC METABOLIC PANEL
BUN: 12 mg/dL (ref 6–23)
CO2: 30 mEq/L (ref 19–32)
Calcium: 9.4 mg/dL (ref 8.4–10.5)
Chloride: 98 mEq/L (ref 96–112)
Creatinine, Ser: 1.07 mg/dL (ref 0.40–1.50)
GFR: 77.85 mL/min (ref >60.00)
Glucose, Bld: 231 mg/dL — ABNORMAL HIGH (ref 70–99)
Potassium: 3 mEq/L — ABNORMAL LOW (ref 3.5–5.1)
Sodium: 138 mEq/L (ref 135–145)

## 2021-09-09 LAB — MICROALBUMIN / CREATININE URINE RATIO
Creatinine,U: 129.1 mg/dL
Microalb Creat Ratio: 11 mg/g (ref 0.0–30.0)
Microalb, Ur: 14.2 mg/dL — ABNORMAL HIGH (ref 0.0–1.9)

## 2021-09-15 ENCOUNTER — Other Ambulatory Visit: Payer: Self-pay

## 2021-09-15 ENCOUNTER — Ambulatory Visit (INDEPENDENT_AMBULATORY_CARE_PROVIDER_SITE_OTHER): Payer: 59 | Admitting: Family Medicine

## 2021-09-15 ENCOUNTER — Encounter: Payer: Self-pay | Admitting: Family Medicine

## 2021-09-15 VITALS — BP 126/70 | HR 72 | Temp 98.0°F | Resp 15 | Ht 72.0 in | Wt 227.2 lb

## 2021-09-15 DIAGNOSIS — E876 Hypokalemia: Secondary | ICD-10-CM | POA: Diagnosis not present

## 2021-09-15 DIAGNOSIS — N1832 Chronic kidney disease, stage 3b: Secondary | ICD-10-CM | POA: Diagnosis not present

## 2021-09-15 DIAGNOSIS — E1165 Type 2 diabetes mellitus with hyperglycemia: Secondary | ICD-10-CM

## 2021-09-15 LAB — GLUCOSE, POCT (MANUAL RESULT ENTRY): POC Glucose: 457 mg/dl — AB (ref 70–99)

## 2021-09-15 NOTE — Patient Instructions (Addendum)
Start insulin 5 units per day for now, return for nurse visit in 2 days for blood sugar check and follow up with me in 1 week. Depending on labs today, may need to change dose of potassium.  If any new fatigue, return of dizziness, new abdominal pain, nausea/vomiting or feeling worse - go to emergency room.   Insulin Injection Instructions, Using Insulin Pens, Adult There are many different types of insulin. The type of insulin that you take may determine how many injections you give yourself and when you need to give the injections. Supplies needed: Soap and water. Your insulin pen. A new needle. Alcohol wipes. A disposal container for sharp items (sharps container), such as an empty plastic bottle with a cover. How to choose a site for injection The body absorbs insulin differently, depending on where the insulin is injected (injection site). It is best to inject insulin into the same body area each time; for example, always in the abdomen. However, you should use a different spot in that area for each injection. Do not inject the insulin in the same spot each time. There are five main areas that can be used for injecting. These areas are: Abdomen. This is the preferred area. Front of thigh. Upper, outer side of thigh. Upper, outer side of arm. Upper, outer part of buttock. How to use an insulin pen Get ready Wash your hands with soap and water for at least 20 seconds. If soap and water are not available, use hand sanitizer. Test your blood sugar (glucose) level and write down that number. Follow any instructions from your health care provider about what to do if your blood glucose level is higher or lower than your normal range. Make sure the insulin pen has the right kind of insulin and there is enough insulin in the pen for the dose. Check the expiration date. If you are using CLEAR insulin, check to see that it is clear and free of clumps. If you are using CLOUDY insulin, mix it by  gently rolling the insulin pen between your palms several times. Do not shake the pen. Remove the cap from the insulin pen. Use an alcohol wipe to clean the rubber tip of the pen. Remove the protective paper tab from the disposable needle. Do not let the needle touch anything. Screw a new, unused needle onto the pen. Remove the outer plastic needle cover. Do not throw away the outer plastic cover yet. If the pen uses a special safety needle, leave the inner needle shield in place. If the pen does not use a special safety needle, remove the inner plastic cover from the needle. If you skip this step, you may not get the right amount of insulin. Follow the manufacturer's instructions to prime the insulin pen with the volume of insulin needed. Hold the pen with the needle pointing up, and push the button on the opposite end of the pen until a drop of insulin appears at the needle tip. If no insulin appears, repeat this step. Turn the button (dial) to the number of units of insulin that you will be injecting. Inject the insulin Use an alcohol wipe to clean the site where you will be inserting the needle. Let the site air-dry. Grip the base of the pen with a loose fist and rest your thumb on the pen or hold the pen in the palm of your writing hand like a pencil. If directed by your health care provider, use your other hand to  pinch and hold about 1 inch (2.5 cm) of skin at the injection site. Do not directly touch the cleaned part of the skin. Gently but quickly, use your writing hand to put the needle straight into the skin. Insert the needle at a 45-degree angle or a 90-degree angle (perpendicular) to the skin, as directed by your health care provider. When the needle is completely inserted into the skin, let go of the skin that you are pinching. Use your thumb or index finger of your writing hand to push the top button of the pen all the way to inject the insulin. Continue to hold the pen in place with  your writing hand. Wait 10 seconds, then pull the needle straight out of the skin. This will allow all of the insulin to go from the pen and needle into your body. Carefully put the outer plastic cover of the needle back over the needle, then unscrew the capped needle and discard it in a sharps container, such as an empty plastic bottle with a cover. Put the plastic cap back on the insulin pen. How to throw away supplies Discard all used needles in a sharps container. Follow the disposal regulations for the area where you live. Do not use any needle more than one time. Throw away empty disposable pens in the regular trash. Questions to ask your health care provider How often should I be taking insulin? How often should I check my blood glucose? What amount of insulin should I be taking each time? What are the side effects? What should I do if my blood glucose is too high? What should I do if my blood glucose is too low? What should I do if I forget to take my insulin? What number should I call if I have questions? Where to find more information American Diabetes Association (ADA): www.diabetes.org Association of Diabetes Care and Education Specialists (ADCES): www.diabeteseducator.org Summary Before you give yourself an insulin injection, be sure to wash your hands for at least 20 seconds and test your blood glucose level. Write down that number. Check the expiration date and the type of insulin that is in the pen. The type of insulin that you take may determine how many injections you give yourself and when you need to give the injections. It is best to inject insulin into the same body area each time; for example, always in the abdomen. However, you should use a different spot in that area for each injection. Do not use a needle more than one time. This information is not intended to replace advice given to you by your health care provider. Make sure you discuss any questions you have with  your health care provider. Document Revised: 02/22/2021 Document Reviewed: 10/03/2020 Elsevier Patient Education  2022 [employer].

## 2021-09-15 NOTE — Progress Notes (Signed)
Subjective:  Patient ID: Dylan Abbott, male    DOB: 1966-11-03  Age: 55 y.o. MRN: [id number]  CC:  Chief Complaint  Patient presents with   Chronic Kidney Disease    Pt here for recheck labs due to abnormal potassium level, no concerns today, lab work redrawn prior to appt time    HPI Dylan Abbott presents for   Diabetes History of CKD, hyperglycemia, prior medication nonadherence.  See prior visits, most recently September 21.  Lantus was cost prohibitive did purchase 1 pen out-of-pocket.  Had not yet used it.  He was taking glipizide 10 mg twice daily.  Previously metformin was held due to renal function concerns but most recent renal function with GFR 53, restarted on metformin 850 mg twice daily, held on insulin initially, home monitoring discussed, glucose monitor kit prescribed.  Close monitoring of renal function planned.  Past week - taking metformin and glipizide BID.  Meter was cost prohibitive - unable to purchase meter.  No n/v/abd pain. Drinking water when working.  Glucose 457 in office now - denies missed dose of meds. Feels ok currently.   Lab Results  Component Value Date   CREATININE 1.07 09/08/2021    Results for orders placed or performed in visit on 09/15/21  POCT glucose (manual entry)  Result Value Ref Range   POC Glucose 457 (A) 70 - 99 mg/dl     Hypokalemia See last visit, had not yet started potassium supplement.  10 mill equivalents daily planned.  Repeat testing today. Taking potassium - over the counter 2 in the morning and 2 in evening - unknown dose. Not sure it was filled. No prescription potassium.  No heart palpitations.  Brief episode of dizziness earlier today - resolved. Feeling ok now.    History Patient Active Problem List   Diagnosis Date Noted   Impingement syndrome of left shoulder 07/30/2020   COVID-19 virus infection 10/31/2019   Abnormal liver function 10/31/2019   Hypokalemia 10/31/2019   Hyponatremia 10/31/2019    Diabetes mellitus ([place]) 11/13/2017   Hyperlipidemia 11/13/2017   Hypertensive disorder 11/13/2017   Past Medical History:  Diagnosis Date   Diabetes mellitus without complication ([place])    Hyperlipidemia    Hypertension    No past surgical history on file. No Known Allergies Prior to Admission medications   Medication Sig Start Date End Date Taking? Authorizing Provider  Accu-Chek FastClix Lancets MISC USE TO CHECK BLOOD SUGAR EVERY DAY 09/08/21   [name], MD  blood glucose meter kit and supplies Check once per day. 09/08/21   [name], MD  glipiZIDE (GLUCOTROL) 10 MG tablet Take 1 tablet (10 mg total) by mouth 2 (two) times daily before a meal. 09/08/21   [name], MD  glucose blood (ACCU-CHEK GUIDE) test strip USE TO CHECK BLOOD SUGAR twice per  DAY Dispense with meter of choice. Patient not taking: Reported on 09/08/2021 08/27/21   [name], MD  insulin glargine (LANTUS SOLOSTAR) 100 UNIT/ML Solostar Pen Inject 10 Units into the skin daily. Patient not taking: Reported on 09/08/2021 08/27/21   [name], MD  Insulin Pen Needle (PEN NEEDLES) 32G X 4 MM MISC 1 application by Does not apply route daily. 08/27/21   [name], MD  metFORMIN (GLUCOPHAGE) 850 MG tablet Take 1 tablet (850 mg total) by mouth 2 (two) times daily with a meal. 09/08/21   [name], MD  NIFEdipine (PROCARDIA XL/NIFEDICAL-XL) 90 MG 24 hr  tablet TAKE 1 TABLET(90 MG) BY MOUTH DAILY 08/12/21   [name], MD  pioglitazone (ACTOS) 15 MG tablet TAKE 1 TABLET(15 MG) BY MOUTH DAILY 08/12/21   [name], MD  potassium chloride (KLOR-CON) 10 MEQ tablet Take 1 tablet (10 mEq total) by mouth daily. 09/08/21   [name], MD  simvastatin (ZOCOR) 80 MG tablet TAKE 1 TABLET(80 MG) BY MOUTH DAILY AT 6 PM 08/12/21   [name], MD   Social History   Socioeconomic History   Marital status: Married    Spouse name: Not on file   Number of children: 2    Years of education: Not on file   Highest education level: Not on file  Occupational History   Occupation: environmental  Tobacco Use   Smoking status: Never   Smokeless tobacco: Never  Substance and Sexual Activity   Alcohol use: Never    Alcohol/week: 0.0 standard drinks   Drug use: Never   Sexual activity: Yes  Other Topics Concern   Not on file  Social History Narrative   Not on file   Social Determinants of Health   Financial Resource Strain: Not on file  Food Insecurity: Not on file  Transportation Needs: Not on file  Physical Activity: Not on file  Stress: Not on file  Social Connections: Not on file  Intimate Partner Violence: Not on file    Review of Systems   Objective:   Vitals:   09/15/21 1624  BP: 126/70  Pulse: 72  Resp: 15  Temp: 98 F (36.7 C)  TempSrc: Temporal  SpO2: 97%  Weight: 227 lb 3.2 oz (103.1 kg)  Height: 6' (1.829 m)     Physical Exam Vitals reviewed.  Constitutional:      General: He is not in acute distress.    Appearance: Normal appearance. He is well-developed. He is not ill-appearing, toxic-appearing or diaphoretic.  HENT:     Head: Normocephalic and atraumatic.  Neck:     Vascular: No carotid bruit or JVD.  Cardiovascular:     Rate and Rhythm: Normal rate and regular rhythm.     Heart sounds: Normal heart sounds. No murmur heard. Pulmonary:     Effort: Pulmonary effort is normal.     Breath sounds: Normal breath sounds. No rales.  Musculoskeletal:     Right lower leg: No edema.     Left lower leg: No edema.  Skin:    General: Skin is warm and dry.  Neurological:     Mental Status: He is alert and oriented to person, place, and time.  Psychiatric:        Mood and Affect: Mood normal.        Behavior: Behavior normal.       Assessment & Plan:  Dylan Abbott is a 55 y.o. male . Hypokalemia - Plan: Basic metabolic panel  Type 2 diabetes mellitus with hyperglycemia, without long-term current use of insulin  (HCC) - Plan: POCT glucose (manual entry)  Stage 3b chronic kidney disease ([place]) - Plan: Basic metabolic panel  Uncontrolled diabetes, even with restarting metformin, continued glipizide.  We will have him start the Lantus initially at 5 units tonight, tomorrow night, nurse visit on Friday for glucose reading as he has had difficulty affording/obtaining a meter.  Asymptomatic at current hyperglycemia but ER precautions were given.  I also provided teaching on use of insulin pen in office using demonstration device, including teach back approach with understanding expressed.  BMP  obtained to evaluate hypokalemia as may need to change to prescription dosing instead of the over-the-counter current regimen, with unknown dose.  ER precautions.  Denies palpitations or new symptoms currently.  Repeat renal function on metformin.  BMP ordered.  No orders of the defined types were placed in this encounter.  Patient Instructions  Start insulin 5 units per day for now, return for nurse visit in 2 days for blood sugar check and follow up with me in 1 week. Depending on labs today, may need to change dose of potassium.  If any new fatigue, return of dizziness, new abdominal pain, nausea/vomiting or feeling worse - go to emergency room.   Insulin Injection Instructions, Using Insulin Pens, Adult There are many different types of insulin. The type of insulin that you take may determine how many injections you give yourself and when you need to give the injections. Supplies needed: Soap and water. Your insulin pen. A new needle. Alcohol wipes. A disposal container for sharp items (sharps container), such as an empty plastic bottle with a cover. How to choose a site for injection The body absorbs insulin differently, depending on where the insulin is injected (injection site). It is best to inject insulin into the same body area each time; for example, always in the abdomen. However, you should use a  different spot in that area for each injection. Do not inject the insulin in the same spot each time. There are five main areas that can be used for injecting. These areas are: Abdomen. This is the preferred area. Front of thigh. Upper, outer side of thigh. Upper, outer side of arm. Upper, outer part of buttock. How to use an insulin pen Get ready Wash your hands with soap and water for at least 20 seconds. If soap and water are not available, use hand sanitizer. Test your blood sugar (glucose) level and write down that number. Follow any instructions from your health care provider about what to do if your blood glucose level is higher or lower than your normal range. Make sure the insulin pen has the right kind of insulin and there is enough insulin in the pen for the dose. Check the expiration date. If you are using CLEAR insulin, check to see that it is clear and free of clumps. If you are using CLOUDY insulin, mix it by gently rolling the insulin pen between your palms several times. Do not shake the pen. Remove the cap from the insulin pen. Use an alcohol wipe to clean the rubber tip of the pen. Remove the protective paper tab from the disposable needle. Do not let the needle touch anything. Screw a new, unused needle onto the pen. Remove the outer plastic needle cover. Do not throw away the outer plastic cover yet. If the pen uses a special safety needle, leave the inner needle shield in place. If the pen does not use a special safety needle, remove the inner plastic cover from the needle. If you skip this step, you may not get the right amount of insulin. Follow the manufacturer's instructions to prime the insulin pen with the volume of insulin needed. Hold the pen with the needle pointing up, and push the button on the opposite end of the pen until a drop of insulin appears at the needle tip. If no insulin appears, repeat this step. Turn the button (dial) to the number of units of  insulin that you will be injecting. Inject the insulin Use an alcohol wipe to  clean the site where you will be inserting the needle. Let the site air-dry. Grip the base of the pen with a loose fist and rest your thumb on the pen or hold the pen in the palm of your writing hand like a pencil. If directed by your health care provider, use your other hand to pinch and hold about 1 inch (2.5 cm) of skin at the injection site. Do not directly touch the cleaned part of the skin. Gently but quickly, use your writing hand to put the needle straight into the skin. Insert the needle at a 45-degree angle or a 90-degree angle (perpendicular) to the skin, as directed by your health care provider. When the needle is completely inserted into the skin, let go of the skin that you are pinching. Use your thumb or index finger of your writing hand to push the top button of the pen all the way to inject the insulin. Continue to hold the pen in place with your writing hand. Wait 10 seconds, then pull the needle straight out of the skin. This will allow all of the insulin to go from the pen and needle into your body. Carefully put the outer plastic cover of the needle back over the needle, then unscrew the capped needle and discard it in a sharps container, such as an empty plastic bottle with a cover. Put the plastic cap back on the insulin pen. How to throw away supplies Discard all used needles in a sharps container. Follow the disposal regulations for the area where you live. Do not use any needle more than one time. Throw away empty disposable pens in the regular trash. Questions to ask your health care provider How often should I be taking insulin? How often should I check my blood glucose? What amount of insulin should I be taking each time? What are the side effects? What should I do if my blood glucose is too high? What should I do if my blood glucose is too low? What should I do if I forget to take my  insulin? What number should I call if I have questions? Where to find more information American Diabetes Association (ADA): www.diabetes.org Association of Diabetes Care and Education Specialists (ADCES): www.diabeteseducator.org Summary Before you give yourself an insulin injection, be sure to wash your hands for at least 20 seconds and test your blood glucose level. Write down that number. Check the expiration date and the type of insulin that is in the pen. The type of insulin that you take may determine how many injections you give yourself and when you need to give the injections. It is best to inject insulin into the same body area each time; for example, always in the abdomen. However, you should use a different spot in that area for each injection. Do not use a needle more than one time. This information is not intended to replace advice given to you by your health care provider. Make sure you discuss any questions you have with your health care provider. Document Revised: 02/22/2021 Document Reviewed: 10/03/2020 Elsevier Patient Education  2022 [employer],   Merri Ray, MD [place], [facility] 09/15/21 6:13 PM

## 2021-09-16 LAB — BASIC METABOLIC PANEL
BUN: 15 mg/dL (ref 6–23)
CO2: 29 mEq/L (ref 19–32)
Calcium: 9.2 mg/dL (ref 8.4–10.5)
Chloride: 96 mEq/L (ref 96–112)
Creatinine, Ser: 1.33 mg/dL (ref 0.40–1.50)
GFR: 59.96 mL/min — ABNORMAL LOW (ref >60.00)
Glucose, Bld: 362 mg/dL — ABNORMAL HIGH (ref 70–99)
Potassium: 3.5 mEq/L (ref 3.5–5.1)
Sodium: 134 mEq/L — ABNORMAL LOW (ref 135–145)

## 2021-09-17 ENCOUNTER — Ambulatory Visit: Payer: 59

## 2021-09-17 ENCOUNTER — Other Ambulatory Visit: Payer: Self-pay

## 2021-09-25 ENCOUNTER — Other Ambulatory Visit: Payer: Self-pay | Admitting: Family Medicine

## 2021-09-25 DIAGNOSIS — I10 Essential (primary) hypertension: Secondary | ICD-10-CM

## 2021-10-31 ENCOUNTER — Other Ambulatory Visit: Payer: Self-pay | Admitting: Family Medicine

## 2021-10-31 DIAGNOSIS — E1165 Type 2 diabetes mellitus with hyperglycemia: Secondary | ICD-10-CM

## 2021-11-09 ENCOUNTER — Other Ambulatory Visit: Payer: Self-pay | Admitting: Family Medicine

## 2021-11-09 DIAGNOSIS — I10 Essential (primary) hypertension: Secondary | ICD-10-CM

## 2022-02-13 IMAGING — DX DG SHOULDER 2+V*L*
3 series · 3 of 3 positions shown · non-contrast
Comparison: None.

CLINICAL DATA: Left shoulder pain for the past 6 months.

EXAM:
LEFT SHOULDER - 2+ VIEW

[shoulder ap]
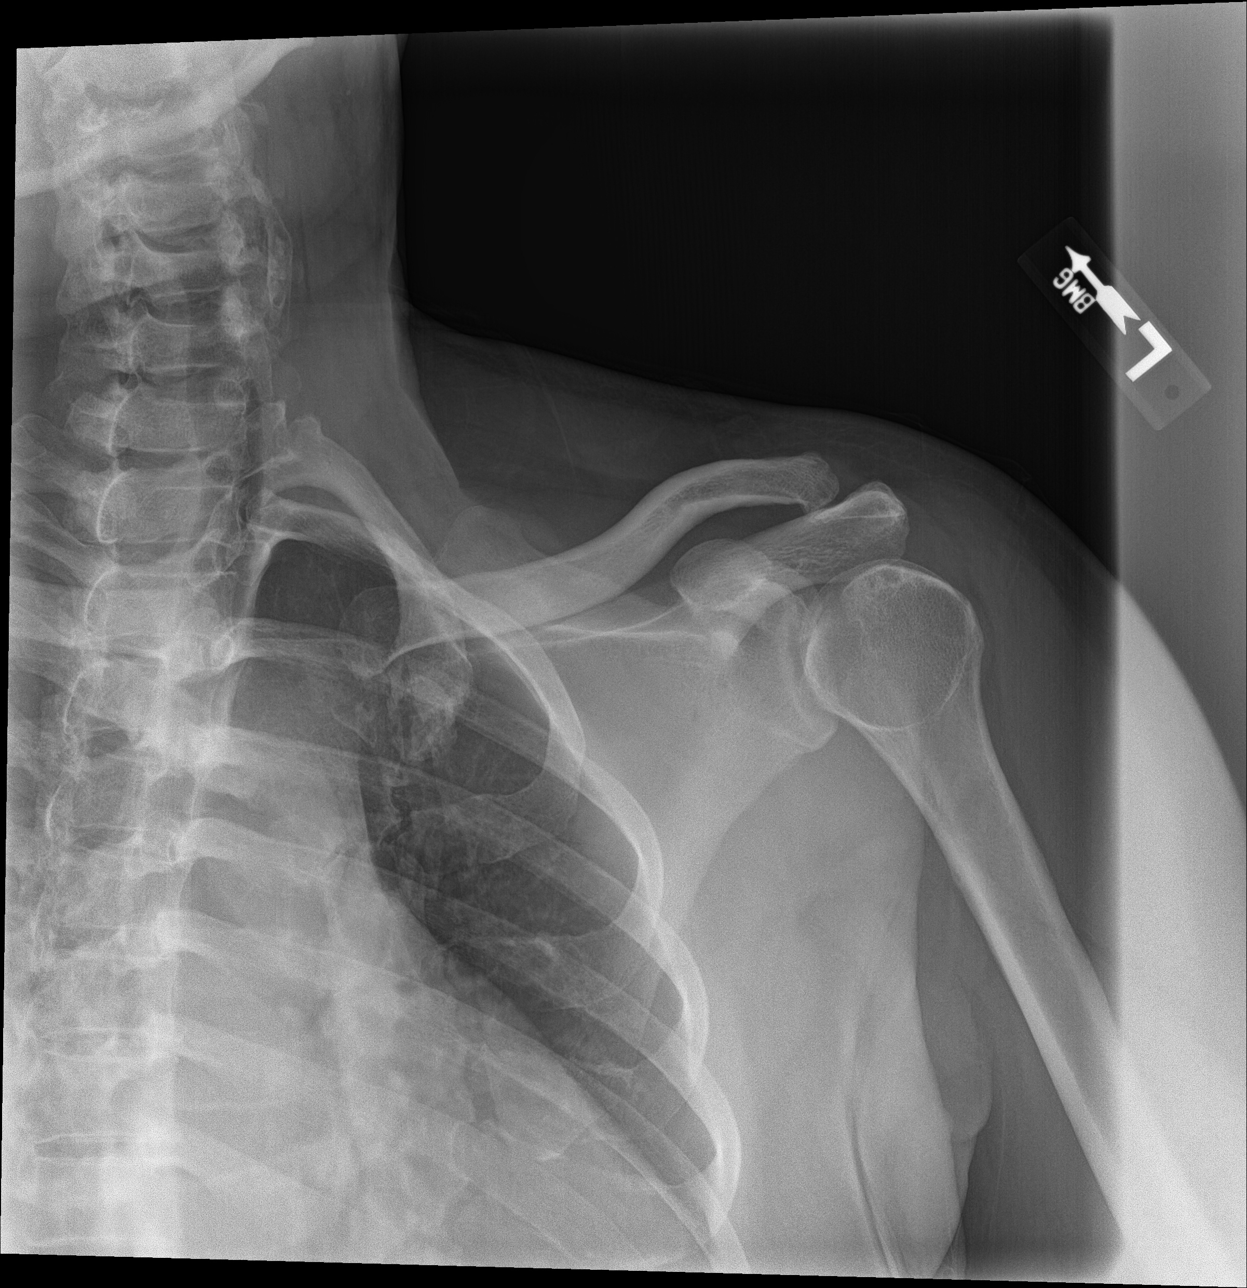

[shoulder y-view]
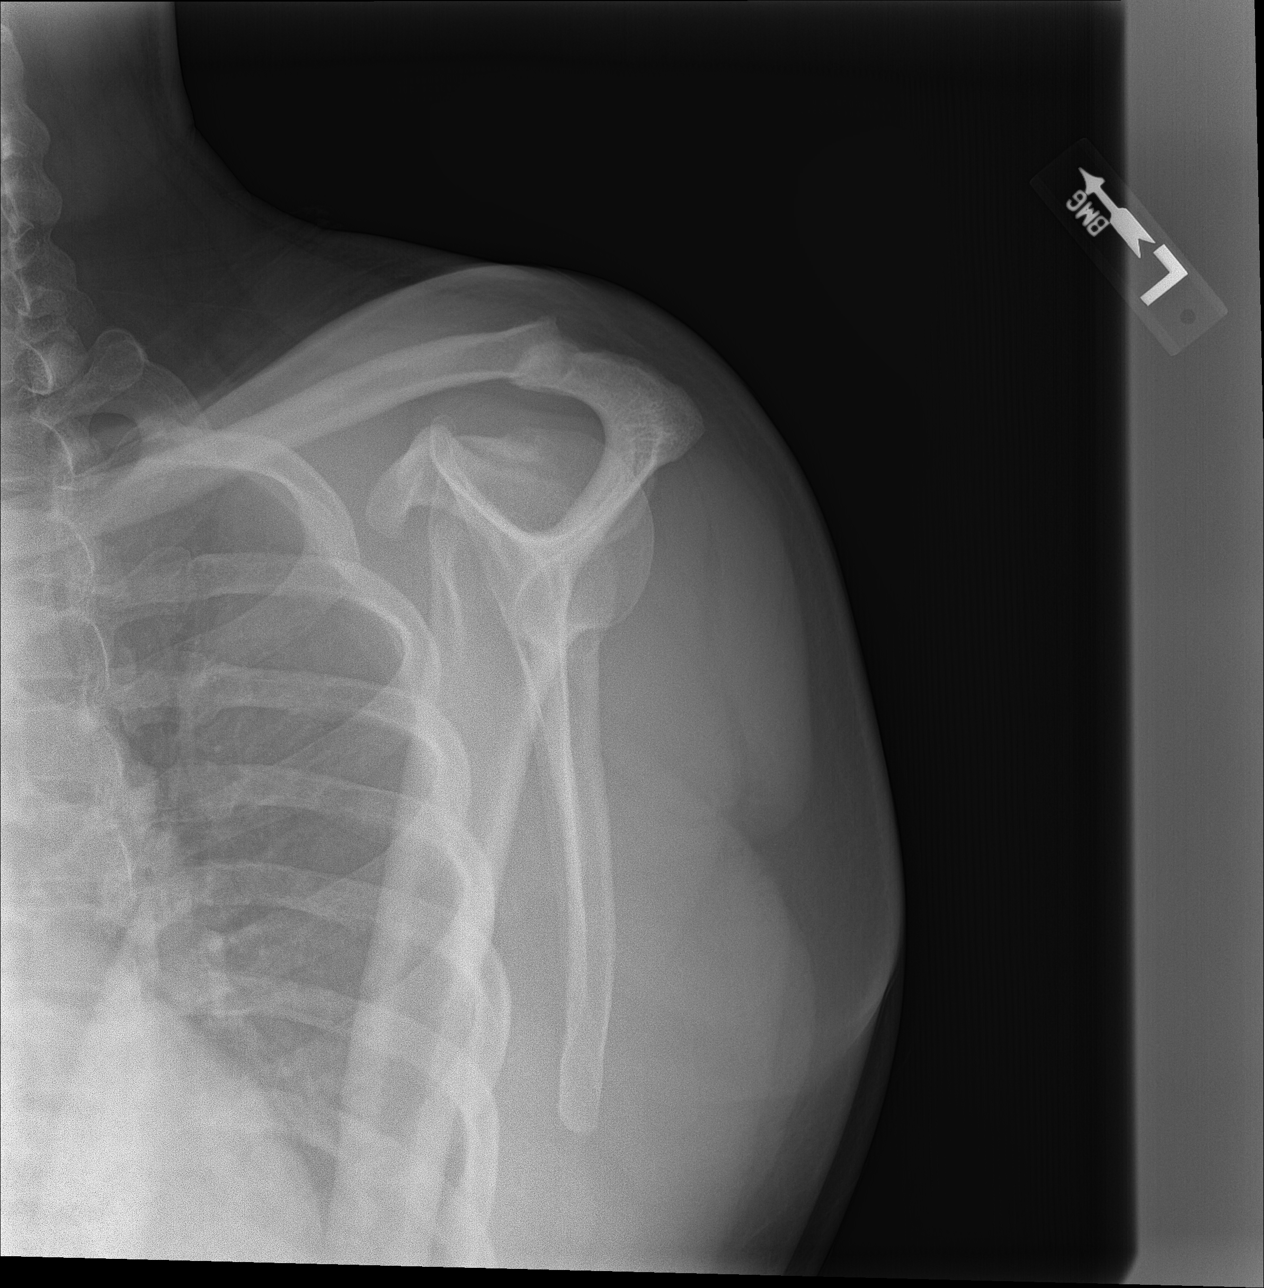

[shoulder axial]
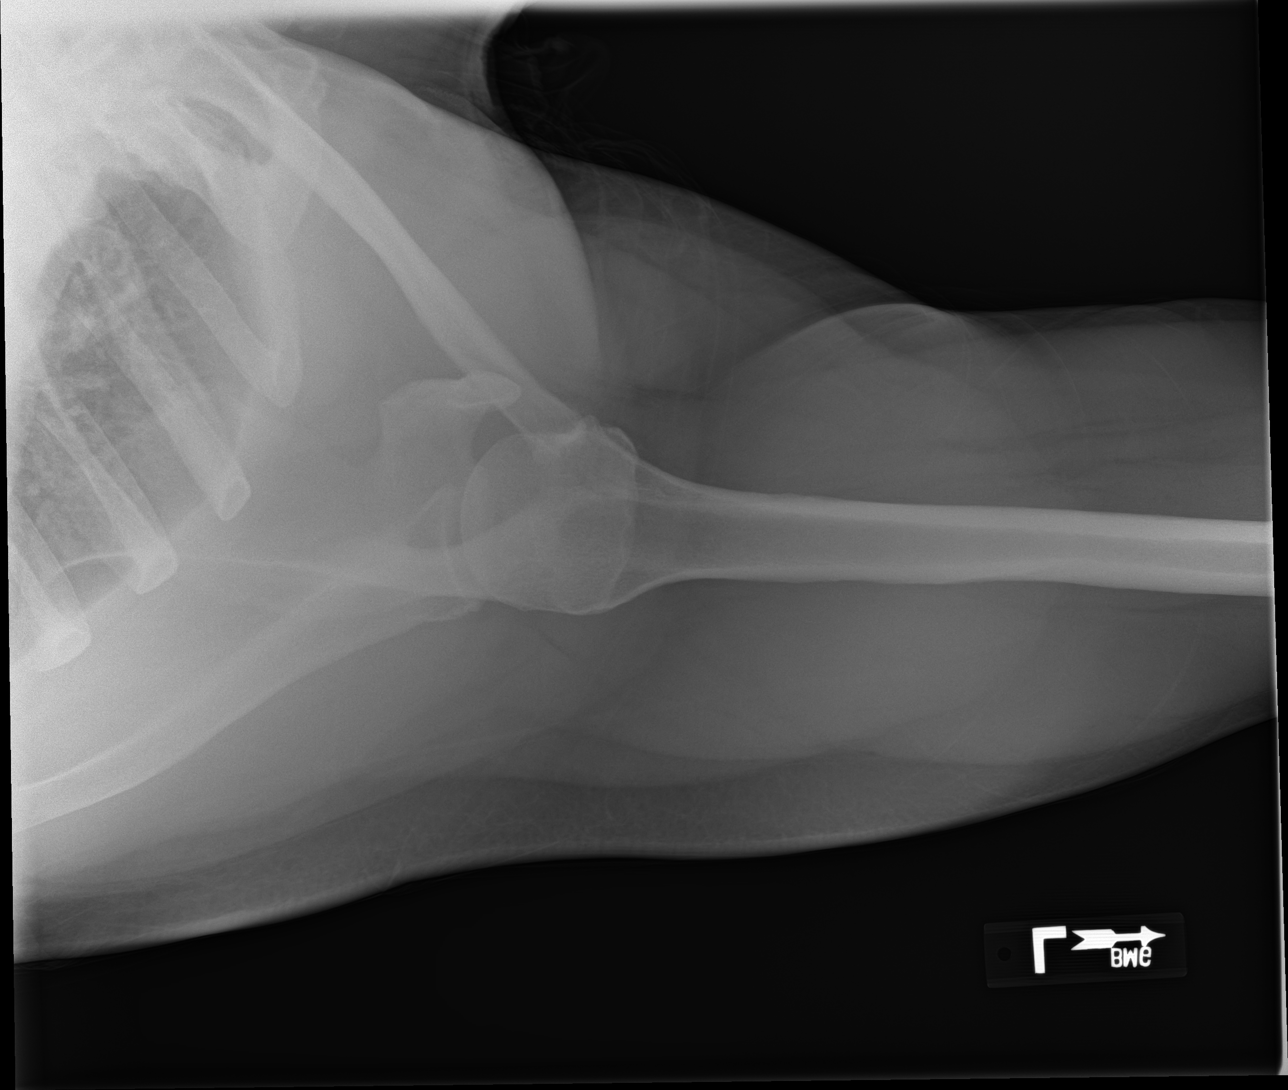

[3 of 3 positions shown; findings below may reference images not displayed]

FINDINGS: No acute fracture or dislocation. Mild acromioclavicular joint space
narrowing with marginal osteophytes. Bone mineralization is normal.
Soft tissues are unremarkable.
IMPRESSION: 1. Mild acromioclavicular osteoarthritis.

## 2022-03-18 ENCOUNTER — Telehealth: Payer: Self-pay | Admitting: Family Medicine

## 2022-03-18 DIAGNOSIS — E785 Hyperlipidemia, unspecified: Secondary | ICD-10-CM

## 2022-03-18 DIAGNOSIS — E876 Hypokalemia: Secondary | ICD-10-CM

## 2022-03-18 DIAGNOSIS — E1165 Type 2 diabetes mellitus with hyperglycemia: Secondary | ICD-10-CM

## 2022-03-18 DIAGNOSIS — I10 Essential (primary) hypertension: Secondary | ICD-10-CM

## 2022-03-18 NOTE — Telephone Encounter (Signed)
Patient called stating that he is out of ALL medications. I did schedule him for an appt ?

## 2022-03-21 MED ORDER — SIMVASTATIN 80 MG PO TABS: ORAL_TABLET | ORAL | 1 refills | Status: DC

## 2022-03-21 MED ORDER — POTASSIUM CHLORIDE ER 10 MEQ PO TBCR: 10.0000 meq | EXTENDED_RELEASE_TABLET | Freq: Every day | ORAL | 1 refills | Status: DC

## 2022-03-21 MED ORDER — LANTUS SOLOSTAR 100 UNIT/ML ~~LOC~~ SOPN: 10.0000 [IU] | PEN_INJECTOR | Freq: Every day | SUBCUTANEOUS | 3 refills | Status: DC

## 2022-03-21 MED ORDER — GLIPIZIDE 10 MG PO TABS: 10.0000 mg | ORAL_TABLET | Freq: Two times a day (BID) | ORAL | 1 refills | Status: DC

## 2022-03-21 MED ORDER — NIFEDIPINE ER OSMOTIC RELEASE 90 MG PO TB24: ORAL_TABLET | ORAL | 1 refills | Status: DC

## 2022-03-21 MED ORDER — METFORMIN HCL 850 MG PO TABS: 850.0000 mg | ORAL_TABLET | Freq: Two times a day (BID) | ORAL | 1 refills | Status: DC

## 2022-03-21 NOTE — Addendum Note (Signed)
Addended by: Patrcia Dolly on: 03/21/2022 05:28 PM ? ? Modules accepted: Orders ? ?

## 2022-03-30 ENCOUNTER — Ambulatory Visit: Payer: 59 | Admitting: Family Medicine

## 2022-05-21 ENCOUNTER — Other Ambulatory Visit: Payer: Self-pay | Admitting: Family Medicine

## 2022-05-21 DIAGNOSIS — I10 Essential (primary) hypertension: Secondary | ICD-10-CM

## 2022-08-31 ENCOUNTER — Encounter: Payer: Self-pay | Admitting: Family Medicine

## 2022-08-31 ENCOUNTER — Ambulatory Visit (INDEPENDENT_AMBULATORY_CARE_PROVIDER_SITE_OTHER): Payer: 59 | Admitting: Family Medicine

## 2022-08-31 VITALS — BP 132/80 | HR 86 | Temp 98.2°F | Ht 73.0 in | Wt 227.6 lb

## 2022-08-31 DIAGNOSIS — R350 Frequency of micturition: Secondary | ICD-10-CM | POA: Diagnosis not present

## 2022-08-31 DIAGNOSIS — H538 Other visual disturbances: Secondary | ICD-10-CM

## 2022-08-31 DIAGNOSIS — Z91199 Patient's noncompliance with other medical treatment and regimen due to unspecified reason: Secondary | ICD-10-CM

## 2022-08-31 DIAGNOSIS — E876 Hypokalemia: Secondary | ICD-10-CM | POA: Diagnosis not present

## 2022-08-31 DIAGNOSIS — E785 Hyperlipidemia, unspecified: Secondary | ICD-10-CM | POA: Diagnosis not present

## 2022-08-31 DIAGNOSIS — E1165 Type 2 diabetes mellitus with hyperglycemia: Secondary | ICD-10-CM | POA: Diagnosis not present

## 2022-08-31 DIAGNOSIS — I10 Essential (primary) hypertension: Secondary | ICD-10-CM | POA: Diagnosis not present

## 2022-08-31 LAB — POCT URINALYSIS DIP (MANUAL ENTRY)
Bilirubin, UA: NEGATIVE
Glucose, UA: >=1000 mg/dL — AB
Ketones, POC UA: NEGATIVE mg/dL
Nitrite, UA: NEGATIVE
Spec Grav, UA: 1.01 (ref 1.010–1.025)
Urobilinogen, UA: 0.2 E.U./dL
pH, UA: 5.5 (ref 5.0–8.0)

## 2022-08-31 LAB — POCT GLYCOSYLATED HEMOGLOBIN (HGB A1C): Hemoglobin A1C: 9.9 % — AB (ref 4.0–5.6)

## 2022-08-31 LAB — GLUCOSE, POCT (MANUAL RESULT ENTRY): POC Glucose: 198 mg/dl — AB (ref 70–99)

## 2022-08-31 MED ORDER — METFORMIN HCL 850 MG PO TABS: 850.0000 mg | ORAL_TABLET | Freq: Two times a day (BID) | ORAL | 1 refills | Status: DC

## 2022-08-31 MED ORDER — POTASSIUM CHLORIDE ER 10 MEQ PO TBCR: 10.0000 meq | EXTENDED_RELEASE_TABLET | Freq: Every day | ORAL | 1 refills | Status: DC

## 2022-08-31 MED ORDER — BLOOD GLUCOSE METER KIT: PACK | 0 refills | Status: DC

## 2022-08-31 MED ORDER — SIMVASTATIN 80 MG PO TABS: ORAL_TABLET | ORAL | 1 refills | Status: DC

## 2022-08-31 MED ORDER — NIFEDIPINE ER OSMOTIC RELEASE 90 MG PO TB24: ORAL_TABLET | ORAL | 1 refills | Status: DC

## 2022-08-31 MED ORDER — GLIPIZIDE 10 MG PO TABS: 10.0000 mg | ORAL_TABLET | Freq: Two times a day (BID) | ORAL | 1 refills | Status: DC

## 2022-08-31 MED ORDER — DAPAGLIFLOZIN PROPANEDIOL 10 MG PO TABS: 10.0000 mg | ORAL_TABLET | Freq: Every day | ORAL | 0 refills | Status: DC

## 2022-08-31 NOTE — Patient Instructions (Addendum)
Diabetes is still uncontrolled but not as bad as last year.  Continue glipizide, metformin and we can try adding farxiga - new medicine once per day. recheck in the next 2 weeks.  At that time we can decide if insulin is needed.  I have prescribed another meter, please check your blood sugars fasting or 2 hours after meals.  If any nausea, vomiting, abdominal pain, headache or confusion, be seen in the emergency room.  You also appear to be overdue for follow-up with nephrology.  Please call your kidney doctor for appointment.  Return to the clinic or go to the nearest emergency room if any of your symptoms worsen or new symptoms occur.  Please have other labs done at walk in lab:  [place] in 8:30-4:30 during weekdays, no appointment needed [place].  [place], [place] [id number]   Type 2 Diabetes Mellitus, Self-Care, Adult Caring for yourself after you have been diagnosed with type 2 diabetes (type 2 diabetes mellitus) means keeping your blood sugar (glucose) under control with a balance of: Nutrition. Exercise. Lifestyle changes. Medicines or insulin, if needed. Support from your team of health care providers and others. What are the risks? Having type 2 diabetes can put you at risk for other long-term (chronic) conditions, such as heart disease and kidney disease. Your health care provider may prescribe medicines to help prevent complications from diabetes. How to monitor your blood glucose  Check your blood glucose every day or as often as told by your health care provider. Have your A1C (hemoglobin A1C) level checked two or more times a year, or as often as told by your health care provider. Your health care provider will set personalized treatment goals for you. Generally, the goal of treatment is to maintain the following blood glucose levels: Before meals: 80-130 mg/dL (4.4-7.2 mmol/L). After meals: below 180 mg/dL (10 mmol/L). A1C level: less than 7%. How to manage  hyperglycemia and hypoglycemia Hyperglycemia symptoms Hyperglycemia, also called high blood glucose, occurs when blood glucose is too high. Make sure you know the early signs of hyperglycemia, such as: Increased thirst. Hunger. Feeling very tired. Needing to urinate more often than usual. Blurry vision. Hypoglycemia symptoms Hypoglycemia, also called low blood glucose, occurs with a blood glucose level at or below 70 mg/dL (3.9 mmol/L). Diabetes medicines lower your blood glucose and can cause hypoglycemia. The risk for hypoglycemia increases during or after exercise, during sleep, during illness, and when skipping meals or not eating for a long time (fasting). It is important to know the symptoms of hypoglycemia and treat it right away. Always have a 15-gram rapid-acting carbohydrate snack with you to treat low blood glucose. Family members and close friends should also know the symptoms and understand how to treat hypoglycemia, in case you are not able to treat yourself. Symptoms may include: Hunger. Anxiety. Sweating and feeling clammy. Dizziness or feeling light-headed. Sleepiness. Increased heart rate. Irritability. Tingling or numbness around the mouth, lips, or tongue. Restless sleep. Severe hypoglycemia is when your blood glucose level is at or below 54 mg/dL (3 mmol/L). Severe hypoglycemia is an emergency. Do not wait to see if the symptoms will go away. Get medical help right away. Call your local emergency services (911 in the [place] Do not drive yourself to the hospital. If you have severe hypoglycemia and you cannot eat or drink, you may need glucagon. A family member or close friend should learn how to check your blood glucose and how to  give you glucagon. Ask your health care provider if you need to have an emergency glucagon kit available. Follow these instructions at home: Medicines Take prescribed insulin or diabetes medicines as told by your health care provider. Do not  run out of insulin or other diabetes medicines. Plan ahead so you always have these available. If you use insulin, adjust your dosage based on your physical activity and what foods you eat. Your health care provider will tell you how to adjust your dosage. Take over-the-counter and prescription medicines only as told by your health care provider. Eating and drinking  What you eat and drink affects your blood glucose and your insulin dosage. Making good choices helps to control your diabetes and prevent other health problems. A healthy meal plan includes eating lean proteins, complex carbohydrates, fresh fruits and vegetables, low-fat dairy products, and healthy fats. Make an appointment to see a registered dietitian to help you create an eating plan that is right for you. Make sure that you: Follow instructions from your health care provider about eating or drinking restrictions. Drink enough fluid to keep your urine pale yellow. Keep a record of the carbohydrates that you eat. Do this by reading food labels and learning the standard serving sizes of foods. Follow your sick-day plan whenever you cannot eat or drink as usual. Make this plan in advance with your health care provider.  Activity Stay active. Exercise regularly, as told by your health care provider. This may include: Stretching and doing strength exercises, such as yoga or weight lifting, two or more times a week. Doing 150 minutes or more of moderate-intensity or vigorous-intensity exercise each week. This could be brisk walking, biking, or water aerobics. Spread out your activity over 3 or more days of the week. Do not go more than 2 days in a row without doing some kind of physical activity. When you start a new exercise or activity, work with your health care provider to adjust your insulin, medicines, or food intake as needed. Lifestyle Do not use any products that contain nicotine or tobacco. These products include cigarettes,  chewing tobacco, and vaping devices, such as e-cigarettes. If you need help quitting, ask your health care provider. If you drink alcohol and your health care provider says that it is safe for you: Limit how much you have to: 0-1 drink a day for women who are not pregnant. 0-2 drinks a day for men. Know how much alcohol is in your drink. In the [place] one drink equals one 12 oz bottle of beer (355 mL), one 5 oz glass of wine (148 mL), or one 1 oz glass of hard liquor (44 mL). Learn to manage stress. If you need help with this, ask your health care provider. Take care of your body  Keep your immunizations up to date. In addition to getting vaccinations as told by your health care provider, it is recommended that you get vaccinated against the following illnesses: The flu (influenza). Get a flu shot every year. Pneumonia. Hepatitis B. Schedule an eye exam soon after your diagnosis, and then one time every year after that. Check your skin and feet every day for cuts, bruises, redness, blisters, or sores. Schedule a foot exam with your health care provider once every year. Brush your teeth and gums two times a day, and floss one or more times a day. Visit your dentist one or more times every 6 months. Maintain a healthy weight. General instructions Share your diabetes management plan with  people in your workplace, school, and household. Carry a medical alert card or wear medical alert jewelry. Keep all follow-up visits. This is important. Questions to ask your health care provider Should I meet with a certified diabetes care and education specialist? Where can I find a support group for people with diabetes? Where to find more information For help and guidance and for more information about diabetes, please visit: American Diabetes Association (ADA): www.diabetes.org American Association of Diabetes Care and Education Specialists (ADCES): www.diabeteseducator.org International Diabetes  Federation (IDF): MemberVerification.ca Summary Caring for yourself after you have been diagnosed with type 2 diabetes (type 2 diabetes mellitus) means keeping your blood sugar (glucose) under control with a balance of nutrition, exercise, lifestyle changes, and medicine. Check your blood glucose every day, as often as told by your health care provider. Having diabetes can put you at risk for other long-term (chronic) conditions, such as heart disease and kidney disease. Your health care provider may prescribe medicines to help prevent complications from diabetes. Share your diabetes management plan with people in your workplace, school, and household. Keep all follow-up visits. This is important. This information is not intended to replace advice given to you by your health care provider. Make sure you discuss any questions you have with your health care provider. Document Revised: 05/05/2021 Document Reviewed: 05/05/2021 Elsevier Patient Education  [place].

## 2022-08-31 NOTE — Progress Notes (Signed)
Subjective:  Patient ID: Dylan Abbott, male    DOB: July 05, 1966  Age: 56 y.o. MRN: [id number]  CC:  Chief Complaint  Patient presents with   Diabetes    Pt states he see floaties in his eyes   Hypokalemia    HPI Dylan Abbott presents for   Follow-up.  Patient last seen by me in September 2022. Denies barriers to care. Denies other medical providers. Did not come in as did not have appointment made.   Diabetes: Complicated by history of CKD, hyperglycemia, prior medication nonadherence.  Last visit in September 2022.  He was taking metformin and glipizide at that time.  Meter was cost prohibitive.  Previously had noted Lantus being cost prohibitive.  Glucose was 457 in the office at that time.  I started him on Lantus 5 units, plan on nurse visit in few days for glucose reading and then further adjustments.  As above has not had recent follow-up.  Currently taking glipizide 54m BID, metformin 8542mBID.  Not taking actos or insulin. Out of simvastatin.   Not checking home readings.   Taking nifedipine daily.   Some blurry vision at times - past month or two - both eyes.  Some increased thirst, frequent urination. Long time.  Denies vomiting, or abdominal pain.  Denies confusion or headache.    Lab Results  Component Value Date   HGBA1C 11.6 (H) 08/12/2021   HGBA1C 8.2 (H) 02/01/2021   HGBA1C 7.7 (H) 08/14/2020   Lab Results  Component Value Date   MICROALBUR 14.2 (H) 09/08/2021   LDLCALC 88 08/12/2021   CREATININE 1.33 09/15/2021    Hypokalemia 3.0 in September 2022, repeat September 28 - 3.5.   Taking klor-con 10Meq qd.   Seen by nephrology in March. 03/16/22 - appears plan for 2 week follow up - he has not followed up since 3/29 visit.     History Patient Active Problem List   Diagnosis Date Noted   Impingement syndrome of left shoulder 07/30/2020   COVID-19 virus infection 10/31/2019   Abnormal liver function 10/31/2019   Hypokalemia 10/31/2019    Hyponatremia 10/31/2019   Diabetes mellitus ([facility]/26/2018   Hyperlipidemia 11/13/2017   Hypertensive disorder 11/13/2017   Past Medical History:  Diagnosis Date   Di[place] mellitus without complication ([facility]   Hyperlipidemia    Hypertension    No past surgical history on file. No Known Allergies Prior to Admission medications   Medication Sig Start Date End Date Taking? Authorizing Provider  Accu-Chek FastClix Lancets MISC USE TO CHECK BLOOD SUGAR EVERY DAY 09/08/21  Yes GrWendie AgresteMD  blood glucose meter kit and supplies Check once per day. 09/08/21  Yes GrWendie AgresteMD  glipiZIDE (GLUCOTROL) 10 MG tablet Take 1 tablet (10 mg total) by mouth 2 (two) times daily before a meal. 03/21/22  Yes GrWendie AgresteMD  metFORMIN (GLUCOPHAGE) 850 MG tablet Take 1 tablet (850 mg total) by mouth 2 (two) times daily with a meal. 03/21/22  Yes GrWendie AgresteMD  glucose blood (ACCU-CHEK GUIDE) test strip USE TO CHECK BLOOD SUGAR twice per  DAY Dispense with meter of choice. Patient not taking: Reported on 09/08/2021 08/27/21   GrWendie AgresteMD  insulin glargine (LANTUS SOLOSTAR) 100 UNIT/ML Solostar Pen Inject 10 Units into the skin daily. Patient not taking: Reported on 08/31/2022 03/21/22   GrWendie AgresteMD  Insulin Pen Needle (PEN NEEDLES) 32G X 4 MM MISC 1 application by  Does not apply route daily. Patient not taking: Reported on 08/31/2022 08/27/21   [name], MD  NIFEdipine (PROCARDIA XL/NIFEDICAL-XL) 90 MG 24 hr tablet TAKE 1 TABLET(90 MG) BY MOUTH DAILY Patient not taking: Reported on 08/31/2022 03/21/22   [name], MD  pioglitazone (ACTOS) 15 MG tablet TAKE 1 TABLET(15 MG) BY MOUTH DAILY Patient not taking: Reported on 08/31/2022 11/01/21   [name], MD  potassium chloride (KLOR-CON) 10 MEQ tablet Take 1 tablet (10 mEq total) by mouth daily. Patient not taking: Reported on 08/31/2022 03/21/22   [name], MD  simvastatin (ZOCOR) 80 MG tablet  TAKE 1 TABLET(80 MG) BY MOUTH DAILY AT 6 PM Patient not taking: Reported on 08/31/2022 03/21/22   [name], MD   Social History   Socioeconomic History   Marital status: Married    Spouse name: Not on file   Number of children: 2   Years of education: Not on file   Highest education level: Not on file  Occupational History   Occupation: environmental  Tobacco Use   Smoking status: Never   Smokeless tobacco: Never  Substance and Sexual Activity   Alcohol use: Never    Alcohol/week: 0.0 standard drinks of alcohol   Drug use: Never   Sexual activity: Yes  Other Topics Concern   Not on file  Social History Narrative   Not on file   Social Determinants of Health   Financial Resource Strain: Not on file  Food Insecurity: Not on file  Transportation Needs: Not on file  Physical Activity: Not on file  Stress: Not on file  Social Connections: Not on file  Intimate Partner Violence: Not on file    Review of Systems Per hPI  Objective:   Vitals:   08/31/22 1503  BP: 132/80  Pulse: 86  Temp: 98.2 F (36.8 C)  SpO2: 99%  Weight: 227 lb 9.6 oz (103.2 kg)  Height: 6' 1"  (1.854 m)     Physical Exam Vitals reviewed.  Constitutional:      General: He is not in acute distress.    Appearance: He is well-developed. He is not ill-appearing, toxic-appearing or diaphoretic.  HENT:     Head: Normocephalic and atraumatic.  Eyes:     Extraocular Movements: Extraocular movements intact.     Pupils: Pupils are equal, round, and reactive to light.  Neck:     Vascular: No carotid bruit or JVD.  Cardiovascular:     Rate and Rhythm: Normal rate and regular rhythm.     Heart sounds: Normal heart sounds. No murmur heard. Pulmonary:     Effort: Pulmonary effort is normal.     Breath sounds: Normal breath sounds. No rales.  Musculoskeletal:     Right lower leg: No edema.     Left lower leg: No edema.  Skin:    General: Skin is warm and dry.  Neurological:     Mental  Status: He is alert and oriented to person, place, and time.  Psychiatric:        Mood and Affect: Mood normal.     Results for orders placed or performed in visit on 08/31/22  POCT urinalysis dipstick  Result Value Ref Range   Color, UA light yellow (A) yellow   Clarity, UA clear clear   Glucose, UA >=1,000 (A) negative mg/dL   Bilirubin, UA negative negative   Ketones, POC UA negative negative mg/dL   Spec Grav, UA 1.010 1.010 - 1.025  Blood, UA trace-intact (A) negative   pH, UA 5.5 5.0 - 8.0   Protein Ur, POC trace (A) negative mg/dL   Urobilinogen, UA 0.2 0.2 or 1.0 E.U./dL   Nitrite, UA Negative Negative   Leukocytes, UA Trace (A) Negative  POCT glucose (manual entry)  Result Value Ref Range   POC Glucose 198 (A) 70 - 99 mg/dl  POCT glycosylated hemoglobin (Hb A1C)  Result Value Ref Range   Hemoglobin A1C 9.9 (A) 4.0 - 5.6 %   HbA1c POC (<> result, manual entry)     HbA1c, POC (prediabetic range)     HbA1c, POC (controlled diabetic range)        Assessment & Plan:  Dylan Abbott is a 56 y.o. male . Type 2 diabetes mellitus with hyperglycemia, without long-term current use of insulin ([place]), frequent urination  -Diabetes uncontrolled, but not as high as in past.  Frequent urination likely due to hyperglycemia.  Continued on glipizide, metformin, add Farxiga once per day with potential mycotic/UTI risk discussed.  RTC precautions.  Home monitoring recommended, recheck 2 weeks to decide if further treatment needed including possible insulin.  ER precautions given if any nausea, vomiting or new symptoms.  Also due for follow-up with nephrology based on last notes.  Recommended calling to schedule appointment.  Handout given on standards of care and discussed need for routine follow-up with diabetes, potential risks of medication nonadherence.  Understanding expressed.  Blurred vision  visual symptoms above likely due to hyperglycemia.  We will recheck in the next 2 weeks,  consider ophthalmology eval.  Again ER precautions given.  Hyperlipidemia, unspecified hyperlipidemia type  -Check labs, continue statin.  Essential hypertension  -Refilled nifedipine, check labs.  Continue same regimen, follow-up with nephrology as above.  Hypokalemia  -Repeat labs.  Adjust supplementation based on results.  Meds ordered this encounter  Medications   simvastatin (ZOCOR) 80 MG tablet    Sig: TAKE 1 TABLET(80 MG) BY MOUTH DAILY AT 6 PM    Dispense:  90 tablet    Refill:  1   NIFEdipine (PROCARDIA XL/NIFEDICAL-XL) 90 MG 24 hr tablet    Sig: TAKE 1 TABLET(90 MG) BY MOUTH DAILY    Dispense:  90 tablet    Refill:  1   potassium chloride (KLOR-CON) 10 MEQ tablet    Sig: Take 1 tablet (10 mEq total) by mouth daily.    Dispense:  90 tablet    Refill:  1   glipiZIDE (GLUCOTROL) 10 MG tablet    Sig: Take 1 tablet (10 mg total) by mouth 2 (two) times daily before a meal.    Dispense:  180 tablet    Refill:  1   metFORMIN (GLUCOPHAGE) 850 MG tablet    Sig: Take 1 tablet (850 mg total) by mouth 2 (two) times daily with a meal.    Dispense:  180 tablet    Refill:  1   dapagliflozin propanediol (FARXIGA) 10 MG TABS tablet    Sig: Take 1 tablet (10 mg total) by mouth daily before breakfast.    Dispense:  90 tablet    Refill:  0   blood glucose meter kit and supplies    Sig: Check once per day.    Dispense:  1 each    Refill:  0    Glucometer of choice per insurance coverage. Dx. E11.65 Check 2 times per day - fasting or 2 hours after meals.    Order Specific Question:   Number of strips  Answer:   100    Order Specific Question:   Number of lancets    Answer:   100   Patient Instructions  Diabetes is still uncontrolled but not as bad as last year.  Continue glipizide, metformin and we can try adding farxiga - new medicine once per day. recheck in the next 2 weeks.  At that time we can decide if insulin is needed.  I have prescribed another meter, please check your  blood sugars fasting or 2 hours after meals.  If any nausea, vomiting, abdominal pain, headache or confusion, be seen in the emergency room.  You also appear to be overdue for follow-up with nephrology.  Please call your kidney doctor for appointment.  Return to the clinic or go to the nearest emergency room if any of your symptoms worsen or new symptoms occur.  Please have other labs done at walk in lab:  [place] in 8:30-4:30 during weekdays, no appointment needed [place].  [place], [place] [address]   Type 2 Diabetes Mellitus, Self-Care, Adult Caring for yourself after you have been diagnosed with type 2 diabetes (type 2 diabetes mellitus) means keeping your blood sugar (glucose) under control with a balance of: Nutrition. Exercise. Lifestyle changes. Medicines or insulin, if needed. Support from your team of health care providers and others. What are the risks? Having type 2 diabetes can put you at risk for other long-term (chronic) conditions, such as heart disease and kidney disease. Your health care provider may prescribe medicines to help prevent complications from diabetes. How to monitor your blood glucose  Check your blood glucose every day or as often as told by your health care provider. Have your A1C (hemoglobin A1C) level checked two or more times a year, or as often as told by your health care provider. Your health care provider will set personalized treatment goals for you. Generally, the goal of treatment is to maintain the following blood glucose levels: Before meals: 80-130 mg/dL (4.4-7.2 mmol/L). After meals: below 180 mg/dL (10 mmol/L). A1C level: less than 7%. How to manage hyperglycemia and hypoglycemia Hyperglycemia symptoms Hyperglycemia, also called high blood glucose, occurs when blood glucose is too high. Make sure you know the early signs of hyperglycemia, such as: Increased thirst. Hunger. Feeling very tired. Needing to urinate more  often than usual. Blurry vision. Hypoglycemia symptoms Hypoglycemia, also called low blood glucose, occurs with a blood glucose level at or below 70 mg/dL (3.9 mmol/L). Diabetes medicines lower your blood glucose and can cause hypoglycemia. The risk for hypoglycemia increases during or after exercise, during sleep, during illness, and when skipping meals or not eating for a long time (fasting). It is important to know the symptoms of hypoglycemia and treat it right away. Always have a 15-gram rapid-acting carbohydrate snack with you to treat low blood glucose. Family members and close friends should also know the symptoms and understand how to treat hypoglycemia, in case you are not able to treat yourself. Symptoms may include: Hunger. Anxiety. Sweating and feeling clammy. Dizziness or feeling light-headed. Sleepiness. Increased heart rate. Irritability. Tingling or numbness around the mouth, lips, or tongue. Restless sleep. Severe hypoglycemia is when your blood glucose level is at or below 54 mg/dL (3 mmol/L). Severe hypoglycemia is an emergency. Do not wait to see if the symptoms will go away. Get medical help right away. Call your local emergency services (911 in the [place] Do not drive yourself to the hospital. If you have severe  hypoglycemia and you cannot eat or drink, you may need glucagon. A family member or close friend should learn how to check your blood glucose and how to give you glucagon. Ask your health care provider if you need to have an emergency glucagon kit available. Follow these instructions at home: Medicines Take prescribed insulin or diabetes medicines as told by your health care provider. Do not run out of insulin or other diabetes medicines. Plan ahead so you always have these available. If you use insulin, adjust your dosage based on your physical activity and what foods you eat. Your health care provider will tell you how to adjust your dosage. Take  over-the-counter and prescription medicines only as told by your health care provider. Eating and drinking  What you eat and drink affects your blood glucose and your insulin dosage. Making good choices helps to control your diabetes and prevent other health problems. A healthy meal plan includes eating lean proteins, complex carbohydrates, fresh fruits and vegetables, low-fat dairy products, and healthy fats. Make an appointment to see a registered dietitian to help you create an eating plan that is right for you. Make sure that you: Follow instructions from your health care provider about eating or drinking restrictions. Drink enough fluid to keep your urine pale yellow. Keep a record of the carbohydrates that you eat. Do this by reading food labels and learning the standard serving sizes of foods. Follow your sick-day plan whenever you cannot eat or drink as usual. Make this plan in advance with your health care provider.  Activity Stay active. Exercise regularly, as told by your health care provider. This may include: Stretching and doing strength exercises, such as yoga or weight lifting, two or more times a week. Doing 150 minutes or more of moderate-intensity or vigorous-intensity exercise each week. This could be brisk walking, biking, or water aerobics. Spread out your activity over 3 or more days of the week. Do not go more than 2 days in a row without doing some kind of physical activity. When you start a new exercise or activity, work with your health care provider to adjust your insulin, medicines, or food intake as needed. Lifestyle Do not use any products that contain nicotine or tobacco. These products include cigarettes, chewing tobacco, and vaping devices, such as e-cigarettes. If you need help quitting, ask your health care provider. If you drink alcohol and your health care provider says that it is safe for you: Limit how much you have to: 0-1 drink a day for women who are  not pregnant. 0-2 drinks a day for men. Know how much alcohol is in your drink. In the [place] one drink equals one 12 oz bottle of beer (355 mL), one 5 oz glass of wine (148 mL), or one 1 oz glass of hard liquor (44 mL). Learn to manage stress. If you need help with this, ask your health care provider. Take care of your body  Keep your immunizations up to date. In addition to getting vaccinations as told by your health care provider, it is recommended that you get vaccinated against the following illnesses: The flu (influenza). Get a flu shot every year. Pneumonia. Hepatitis B. Schedule an eye exam soon after your diagnosis, and then one time every year after that. Check your skin and feet every day for cuts, bruises, redness, blisters, or sores. Schedule a foot exam with your health care provider once every year. Brush your teeth and gums two times a day, and floss  one or more times a day. Visit your dentist one or more times every 6 months. Maintain a healthy weight. General instructions Share your diabetes management plan with people in your workplace, school, and household. Carry a medical alert card or wear medical alert jewelry. Keep all follow-up visits. This is important. Questions to ask your health care provider Should I meet with a certified diabetes care and education specialist? Where can I find a support group for people with diabetes? Where to find more information For help and guidance and for more information about diabetes, please visit: American Diabetes Association (ADA): www.diabetes.org American Association of Diabetes Care and Education Specialists (ADCES): www.diabeteseducator.org International Diabetes Federation (IDF): MemberVerification.ca Summary Caring for yourself after you have been diagnosed with type 2 diabetes (type 2 diabetes mellitus) means keeping your blood sugar (glucose) under control with a balance of nutrition, exercise, lifestyle changes, and  medicine. Check your blood glucose every day, as often as told by your health care provider. Having diabetes can put you at risk for other long-term (chronic) conditions, such as heart disease and kidney disease. Your health care provider may prescribe medicines to help prevent complications from diabetes. Share your diabetes management plan with people in your workplace, school, and household. Keep all follow-up visits. This is important. This information is not intended to replace advice given to you by your health care provider. Make sure you discuss any questions you have with your health care provider. Document Revised: 05/05/2021 Document Reviewed: 05/05/2021 Elsevier Patient Education  [place],   Merri Ray, MD [place], [place] [facility] 08/31/22 3:41 PM

## 2022-09-01 ENCOUNTER — Other Ambulatory Visit (HOSPITAL_COMMUNITY): Admission: RE | Admit: 2022-09-01 | Payer: 59 | Source: Ambulatory Visit

## 2022-09-01 ENCOUNTER — Other Ambulatory Visit: Payer: 59

## 2022-09-08 ENCOUNTER — Telehealth: Payer: Self-pay | Admitting: Family Medicine

## 2022-09-08 DIAGNOSIS — E785 Hyperlipidemia, unspecified: Secondary | ICD-10-CM

## 2022-09-08 DIAGNOSIS — I10 Essential (primary) hypertension: Secondary | ICD-10-CM

## 2022-09-08 NOTE — Telephone Encounter (Signed)
Caller name: Sricharan   On DPR? :yes/no: Yes  Call back number:  [phone]  Provider they see:  Carlota Raspberry  Reason for call:  pt called b/c went to get labs done at [facility], but they had not rec'd the orders, so he was unable to get the labs done. Please advise.

## 2022-09-08 NOTE — Telephone Encounter (Signed)
Pt has been informed.

## 2022-09-10 ENCOUNTER — Other Ambulatory Visit: Payer: Self-pay | Admitting: Family Medicine

## 2022-09-10 DIAGNOSIS — E1165 Type 2 diabetes mellitus with hyperglycemia: Secondary | ICD-10-CM

## 2022-09-12 ENCOUNTER — Telehealth: Payer: Self-pay | Admitting: Family Medicine

## 2022-09-12 NOTE — Telephone Encounter (Signed)
Caller name: Clary  On DPR? :yes/no: Yes  Call back number: [phone]  Provider they see:  Carlota Raspberry  Reason for call:  Pt absolutely refused to tell me why he was calling other than he ONLY wanted to speak with Dr. Carlota Raspberry.

## 2022-09-12 NOTE — Telephone Encounter (Signed)
Called pt and informed him where to go for labs as he kept going to [facility] long

## 2022-09-14 ENCOUNTER — Other Ambulatory Visit (INDEPENDENT_AMBULATORY_CARE_PROVIDER_SITE_OTHER): Payer: 59

## 2022-09-14 DIAGNOSIS — I10 Essential (primary) hypertension: Secondary | ICD-10-CM | POA: Diagnosis not present

## 2022-09-14 DIAGNOSIS — E785 Hyperlipidemia, unspecified: Secondary | ICD-10-CM

## 2022-09-14 LAB — COMPREHENSIVE METABOLIC PANEL
ALT: 16 U/L (ref 0–53)
AST: 21 U/L (ref 0–37)
Albumin: 4.4 g/dL (ref 3.5–5.2)
Alkaline Phosphatase: 52 U/L (ref 39–117)
BUN: 20 mg/dL (ref 6–23)
CO2: 30 mEq/L (ref 19–32)
Calcium: 9.4 mg/dL (ref 8.4–10.5)
Chloride: 101 mEq/L (ref 96–112)
Creatinine, Ser: 1.12 mg/dL (ref 0.40–1.50)
GFR: 73.18 mL/min (ref >60.00)
Glucose, Bld: 163 mg/dL — ABNORMAL HIGH (ref 70–99)
Potassium: 3 mEq/L — ABNORMAL LOW (ref 3.5–5.1)
Sodium: 140 mEq/L (ref 135–145)
Total Bilirubin: 0.6 mg/dL (ref 0.2–1.2)
Total Protein: 7.6 g/dL (ref 6.0–8.3)

## 2022-09-14 LAB — LIPID PANEL
Cholesterol: 196 mg/dL (ref 0–200)
HDL: 57.7 mg/dL (ref >39.00)
LDL Cholesterol: 122 mg/dL — ABNORMAL HIGH (ref 0–99)
NonHDL: 138.41
Total CHOL/HDL Ratio: 3
Triglycerides: 84 mg/dL (ref 0.0–149.0)
VLDL: 16.8 mg/dL (ref 0.0–40.0)

## 2022-09-15 ENCOUNTER — Encounter: Payer: Self-pay | Admitting: Family Medicine

## 2022-09-15 ENCOUNTER — Ambulatory Visit (INDEPENDENT_AMBULATORY_CARE_PROVIDER_SITE_OTHER): Payer: 59 | Admitting: Family Medicine

## 2022-09-15 VITALS — BP 118/60 | HR 73 | Temp 98.7°F | Ht 73.0 in | Wt 223.4 lb

## 2022-09-15 DIAGNOSIS — R600 Localized edema: Secondary | ICD-10-CM | POA: Diagnosis not present

## 2022-09-15 DIAGNOSIS — E1165 Type 2 diabetes mellitus with hyperglycemia: Secondary | ICD-10-CM

## 2022-09-15 LAB — GLUCOSE, POCT (MANUAL RESULT ENTRY): POC Glucose: 191 mg/dl — AB (ref 70–99)

## 2022-09-15 MED ORDER — TRULICITY 0.75 MG/0.5ML ~~LOC~~ SOAJ: 0.7500 mg | SUBCUTANEOUS | 1 refills | Status: DC

## 2022-09-15 MED ORDER — BLOOD GLUCOSE METER KIT: PACK | 0 refills | Status: DC

## 2022-09-15 MED ORDER — ACCU-CHEK GUIDE VI STRP: ORAL_STRIP | 1 refills | Status: DC

## 2022-09-15 NOTE — Progress Notes (Signed)
Subjective:  Patient ID: Dylan Abbott, male    DOB: 1966/10/17  Age: 56 y.o. MRN: [id number]  CC:  Chief Complaint  Patient presents with   Diabetes    Pt states the meds are too expensive so he hasnt been taking what you prescribed     HPI Dylan Abbott presents for   Diabetes: Complicated by CKD, hyperglycemia, prior medication nonadherence.  Recent visit September 13, had not seen him since September 2022.  He was not taking the previously prescribed Lantus at his last visit, just glipizide 10 mg twice daily and metformin 850 mg twice daily.  He also was off Actos and simvastatin.  Continued metformin, glipizide and added Farxiga 10 mg daily.  Metformin and glipizide BID. Simvastatin reordered - taking daily.  Dylan Abbott was too expensive.  Home readings: not checking. Did not get meter.  No FH of multiple endocrine neoplasia or medullary thyroid CA, no personal hx of pancreatitis.  Some swelling in ankles if standing at work long time. Better overnight.  No added salt to food. No fast food. Home cooking.    Lab Results  Component Value Date   HGBA1C 9.9 (A) 08/31/2022   HGBA1C 11.6 (H) 08/12/2021   HGBA1C 8.2 (H) 02/01/2021   Lab Results  Component Value Date   MICROALBUR 14.2 (H) 09/08/2021   LDLCALC 122 (H) 09/14/2022   CREATININE 1.12 09/14/2022    History Patient Active Problem List   Diagnosis Date Noted   Impingement syndrome of left shoulder 07/30/2020   COVID-19 virus infection 10/31/2019   Abnormal liver function 10/31/2019   Hypokalemia 10/31/2019   Hyponatremia 10/31/2019   Diabetes mellitus ([place]) 11/13/2017   Hyperlipidemia 11/13/2017   Hypertensive disorder 11/13/2017   Past Medical History:  Diagnosis Date   Diabetes mellitus without complication ([place])    Hyperlipidemia    Hypertension    No past surgical history on file. No Known Allergies Prior to Admission medications   Medication Sig Start Date End Date Taking? Authorizing Provider   Accu-Chek FastClix Lancets MISC USE TO CHECK BLOOD SUGAR EVERY DAY 09/08/21  Yes [name], MD  blood glucose meter kit and supplies Check once per day. 08/31/22  Yes [name], MD  glipiZIDE (GLUCOTROL) 10 MG tablet Take 1 tablet (10 mg total) by mouth 2 (two) times daily before a meal. 08/31/22  Yes [name], MD  metFORMIN (GLUCOPHAGE) 850 MG tablet Take 1 tablet (850 mg total) by mouth 2 (two) times daily with a meal. 08/31/22  Yes [name], MD  NIFEdipine (PROCARDIA XL/NIFEDICAL-XL) 90 MG 24 hr tablet TAKE 1 TABLET(90 MG) BY MOUTH DAILY 08/31/22  Yes [name], MD  potassium chloride (KLOR-CON) 10 MEQ tablet Take 1 tablet (10 mEq total) by mouth daily. 08/31/22  Yes [name], MD  simvastatin (ZOCOR) 80 MG tablet TAKE 1 TABLET(80 MG) BY MOUTH DAILY AT 6 PM 08/31/22  Yes [name], MD  dapagliflozin propanediol (FARXIGA) 10 MG TABS tablet Take 1 tablet (10 mg total) by mouth daily before breakfast. Patient not taking: Reported on 09/15/2022 08/31/22   [name], MD  glucose blood (ACCU-CHEK GUIDE) test strip USE TO CHECK BLOOD SUGAR twice per  DAY Dispense with meter of choice. Patient not taking: Reported on 09/08/2021 08/27/21   [name], MD  Insulin Pen Needle (PEN NEEDLES) 32G X 4 MM MISC 1 application by Does not apply route daily. Patient not taking: Reported on 08/31/2022 08/27/21  [name], MD   Social History   Socioeconomic History   Marital status: Married    Spouse name: Not on file   Number of children: 2   Years of education: Not on file   Highest education level: Not on file  Occupational History   Occupation: environmental  Tobacco Use   Smoking status: Never   Smokeless tobacco: Never  Substance and Sexual Activity   Alcohol use: Never    Alcohol/week: 0.0 standard drinks of alcohol   Drug use: Never   Sexual activity: Yes  Other Topics Concern   Not on file  Social History Narrative   Not  on file   Social Determinants of Health   Financial Resource Strain: Not on file  Food Insecurity: Not on file  Transportation Needs: Not on file  Physical Activity: Not on file  Stress: Not on file  Social Connections: Not on file  Intimate Partner Violence: Not on file    Review of Systems  Constitutional:  Negative for fatigue and unexpected weight change.  Eyes:  Negative for visual disturbance.  Respiratory:  Negative for cough, chest tightness and shortness of breath.   Cardiovascular:  Positive for leg swelling. Negative for chest pain and palpitations.  Gastrointestinal:  Negative for abdominal pain and blood in stool.  Neurological:  Negative for dizziness, light-headedness and headaches.     Objective:   Vitals:   09/15/22 1343  BP: 118/60  Pulse: 73  Temp: 98.7 F (37.1 C)  SpO2: 97%  Weight: 223 lb 6.4 oz (101.3 kg)  Height: 6' 1"  (1.854 m)     Physical Exam Vitals reviewed.  Constitutional:      Appearance: He is well-developed.  HENT:     Head: Normocephalic and atraumatic.  Neck:     Vascular: No carotid bruit or JVD.  Cardiovascular:     Rate and Rhythm: Normal rate and regular rhythm.     Heart sounds: Normal heart sounds. No murmur heard. Pulmonary:     Effort: Pulmonary effort is normal.     Breath sounds: Normal breath sounds. No rales.  Musculoskeletal:     Right lower leg: No edema.     Left lower leg: No edema.  Skin:    General: Skin is warm and dry.  Neurological:     Mental Status: He is alert and oriented to person, place, and time.  Psychiatric:        Mood and Affect: Mood normal.     Results for orders placed or performed in visit on 09/15/22  POCT glucose (manual entry)  Result Value Ref Range   POC Glucose 191 (A) 70 - 99 mg/dl    Assessment & Plan:  Dylan Abbott is a 56 y.o. male . Pedal edema  -New concern, could be related to chronic pedal edema although it improves overnight, possibly related to calcium  channel blocker.  Handout given on high sodium foods, continue to monitor and if persistent may need to change to other antihypertensive.  Handout given on edema.  RTC precautions if worsening  Type 2 diabetes mellitus with hyperglycemia, without long-term current use of insulin ([place]) - Plan: blood glucose meter kit and supplies, glucose blood (ACCU-CHEK GUIDE) test strip, POCT glucose (manual entry), Dulaglutide (TRULICITY) 1.30 QM/5.7QI SOPN  -Unfortunately SGLT2 was cost prohibitive.  We will try GLP with Trulicity potential side effects and risk discussed.  Recheck 1 month, continue other meds same doses for now and new meter prescribed for  home monitoring.  Meds ordered this encounter  Medications   blood glucose meter kit and supplies    Sig: Check once per day.    Dispense:  1 each    Refill:  0    Glucometer of choice per insurance coverage. Dx. E11.65 Check 2 times per day - fasting or 2 hours after meals.    Order Specific Question:   Number of strips    Answer:   100    Order Specific Question:   Number of lancets    Answer:   100   glucose blood (ACCU-CHEK GUIDE) test strip    Sig: USE TO CHECK BLOOD SUGAR twice per  DAY Dispense with meter of choice.    Dispense:  100 strip    Refill:  1    Testing strips for meter covered by insurance and lancets. #100, 1 rf.   Dulaglutide (TRULICITY) 3.47 QQ/5.9DG SOPN    Sig: Inject 0.75 mg into the skin once a week.    Dispense:  3 mL    Refill:  1   Patient Instructions  Swelling can be related to meds or salt in diet. See info below and handout. If continues, then we may need to change nifedipine to other medicine.  Check blood sugars, add new med once per week.  Recheck in 1 month. Return to the clinic or go to the nearest emergency room if any of your symptoms worsen or new symptoms occur.  Peripheral Edema  Peripheral edema is swelling that is caused by a buildup of fluid. Peripheral edema most often affects the lower legs,  ankles, and feet. It can also develop in the arms, hands, and face. The area of the body that has peripheral edema will look swollen. It may also feel heavy or warm. Your clothes may start to feel tight. Pressing on the area may make a temporary dent in your skin (pitting edema). You may not be able to move your swollen arm or leg as much as usual. There are many causes of peripheral edema. It can happen because of a complication of other conditions such as heart failure, kidney disease, or a problem with your circulation. It also can be a side effect of certain medicines or happen because of an infection. It often happens to women during pregnancy. Sometimes, the cause is not known. Follow these instructions at home: Managing pain, stiffness, and swelling  Raise (elevate) your legs while you are sitting or lying down. Move around often to prevent stiffness and to reduce swelling. Do not sit or stand for long periods of time. Do not wear tight clothing. Do not wear garters on your upper legs. Exercise your legs to get your circulation going. This helps to move the fluid back into your blood vessels, and it may help the swelling go down. Wear compression stockings as told by your health care provider. These stockings help to prevent blood clots and reduce swelling in your legs. It is important that these are the correct size. These stockings should be prescribed by your doctor to prevent possible injuries. If elastic bandages or wraps are recommended, use them as told by your health care provider. Medicines Take over-the-counter and prescription medicines only as told by your health care provider. Your health care provider may prescribe medicine to help your body get rid of excess water (diuretic). Take this medicine if you are told to take it. General instructions Eat a low-salt (low-sodium) diet as told by your health care  provider. Sometimes, eating less salt may reduce swelling. Pay attention to  any changes in your symptoms. Moisturize your skin daily to help prevent skin from cracking and draining. Keep all follow-up visits. This is important. Contact a health care provider if: You have a fever. You have swelling in only one leg. You have increased swelling, redness, or pain in one or both of your legs. You have drainage or sores at the area where you have edema. Get help right away if: You have edema that starts suddenly or is getting worse, especially if you are pregnant or have a medical condition. You develop shortness of breath, especially when you are lying down. You have pain in your chest or abdomen. You feel weak. You feel like you will faint. These symptoms may be an emergency. Get help right away. Call 911. Do not wait to see if the symptoms will go away. Do not drive yourself to the hospital. Summary Peripheral edema is swelling that is caused by a buildup of fluid. Peripheral edema most often affects the lower legs, ankles, and feet. Move around often to prevent stiffness and to reduce swelling. Do not sit or stand for long periods of time. Pay attention to any changes in your symptoms. Contact a health care provider if you have edema that starts suddenly or is getting worse, especially if you are pregnant or have a medical condition. Get help right away if you develop shortness of breath, especially when lying down. This information is not intended to replace advice given to you by your health care provider. Make sure you discuss any questions you have with your health care provider. Document Revised: 08/09/2021 Document Reviewed: 08/09/2021 Elsevier Patient Education  [place] Injection What is this medication? DULAGLUTIDE (DOO la GLOO tide) treats type 2 diabetes. It works by increasing insulin levels in your body, which decreases your blood sugar (glucose). It also reduces the amount of sugar released into your blood and slows down  your digestion. It can also be used to lower the risk of heart attack and stroke in people with type 2 diabetes. Changes to diet and exercise are often combined with this medication. This medicine may be used for other purposes; ask your health care provider or pharmacist if you have questions. COMMON BRAND NAME(S): Trulicity What should I tell my care team before I take this medication? They need to know if you have any of these conditions: Endocrine tumors (MEN 2) or if someone in your family had these tumors Eye disease, vision problems History of pancreatitis Kidney disease Liver disease Stomach or intestine problems Thyroid cancer or if someone in your family had thyroid cancer An unusual or allergic reaction to dulaglutide, other medications, foods, dyes, or preservatives Pregnant or trying to get pregnant Breast-feeding How should I use this medication? This medication is injected under the skin. You will be taught how to prepare and give it. Take it as directed on the prescription label on the same day of each week. Do NOT prime the pen. Keep taking it unless your care team tells you to stop. If you use this medication with insulin, you should inject this medication and the insulin separately. Do not mix them together. Do not give the injections right next to each other. Change (rotate) injection sites with each injection. This medication comes with INSTRUCTIONS FOR USE. Ask your pharmacist for directions on how to use this medication. Read the information carefully. Talk to your pharmacist or  care team if you have questions. It is important that you put your used needles and syringes in a special sharps container. Do not put them in a trash can. If you do not have a sharps container, call your pharmacist or care team to get one. A special MedGuide will be given to you by the pharmacist with each prescription and refill. Be sure to read this information carefully each time. Talk to your  care team about the use of this medication in children. While it may be prescribed for children as young as 10 years for selected conditions, precautions do apply. Overdosage: If you think you have taken too much of this medicine contact a poison control center or emergency room at once. NOTE: This medicine is only for you. Do not share this medicine with others. What if I miss a dose? If you miss a dose, take it as soon as you can unless it is more than 3 days late. If it is more than 3 days late, skip the missed dose. Take the next dose at the normal time. What may interact with this medication? Other medications for diabetes Many medications may cause changes in blood sugar, these include: Alcohol containing beverages Antiviral medications for HIV or AIDS Aspirin and aspirin-like medications Certain medications for blood pressure, heart disease, irregular heart beat Chromium Diuretics Male hormones, such as estrogens or progestins, birth control pills Fenofibrate Gemfibrozil Isoniazid Lanreotide Male hormones or anabolic steroids MAOIs like Carbex, Eldepryl, Marplan, Nardil, and Parnate Medications for allergies, asthma, cold, or cough Medications for depression, anxiety, or psychotic disturbances Medications for weight loss Niacin Nicotine NSAIDs, medications for pain and inflammation, like ibuprofen or naproxen Octreotide Pasireotide Pentamidine Phenytoin Probenecid Quinolone antibiotics such as ciprofloxacin, levofloxacin, ofloxacin Some herbal dietary supplements Steroid medications such as prednisone or cortisone Sulfamethoxazole; trimethoprim Thyroid hormones Some medications can hide the warning symptoms of low blood sugar (hypoglycemia). You may need to monitor your blood sugar more closely if you are taking one of these medications. These include: Beta-blockers, often used for high blood pressure or heart problems (examples include atenolol, metoprolol,  propranolol) Clonidine Guanethidine Reserpine This list may not describe all possible interactions. Give your health care provider a list of all the medicines, herbs, non-prescription drugs, or dietary supplements you use. Also tell them if you smoke, drink alcohol, or use illegal drugs. Some items may interact with your medicine. What should I watch for while using this medication? Visit your care team for regular checks on your progress. Check with your care team if you have severe diarrhea, nausea, and vomiting, or if you sweat a lot. The loss of too much body fluid may make it dangerous for you to take this medication. A test called the HbA1C (A1C) will be monitored. This is a simple blood test. It measures your blood sugar control over the last 2 to 3 months. You will receive this test every 3 to 6 months. Learn how to check your blood sugar. Learn the symptoms of low and high blood sugar and how to manage them. Always carry a quick-source of sugar with you in case you have symptoms of low blood sugar. Examples include hard sugar candy or glucose tablets. Make sure others know that you can choke if you eat or drink when you develop serious symptoms of low blood sugar, such as seizures or unconsciousness. Get medical help at once. Tell your care team if you have high blood sugar. You might need to change the dose  of your medication. If you are sick or exercising more than usual, you may need to change the dose of your medication. Do not skip meals. Ask your care team if you should avoid alcohol. Many nonprescription cough and cold products contain sugar or alcohol. These can affect blood sugar. Pens should never be shared. Even if the needle is changed, sharing may result in passing of viruses like hepatitis or HIV. Wear a medical ID bracelet or chain. Carry a card that describes your condition. List the medications and doses you take on the card. What side effects may I notice from receiving this  medication? Side effects that you should report to your care team as soon as possible: Allergic reactions--skin rash, itching, hives, swelling of the face, lips, tongue, or throat Change in vision Dehydration--increased thirst, dry mouth, feeling faint or lightheaded, headache, dark yellow or brown urine Kidney injury--decrease in the amount of urine, swelling of the ankles, hands, or feet Pancreatitis--severe stomach pain that spreads to your back or gets worse after eating or when touched, fever, nausea, vomiting Thyroid cancer--new mass or lump in the neck, pain or trouble swallowing, trouble breathing, hoarseness Side effects that usually do not require medical attention (report to your care team if they continue or are bothersome): Diarrhea Loss of appetite Nausea Stomach pain Vomiting This list may not describe all possible side effects. Call your doctor for medical advice about side effects. You may report side effects to FDA at 1-800-FDA-1088. Where should I keep my medication? Keep out of the reach of children and pets. Refrigeration (preferred): Store unopened pens in a refrigerator between 2 and 8 degrees C (36 and 46 degrees F). Keep it in the original carton until you are ready to take it. Do not freeze or use if the medication has been frozen. Protect from light. Get rid of any unused medication after the expiration date on the label. Room Temperature: The pen may be stored at room temperature below 30 degrees C (86 degrees F) for up to a total of 14 days if needed. Protect from light. Avoid exposure to extreme heat. If it is stored at room temperature, throw away any unused medication after 14 days or after it expires, whichever is first. To get rid of medications that are no longer needed or have expired: Take the medication to a medication take-back program. Check with your pharmacy or law enforcement to find a location. If you cannot return the medication, ask your pharmacist  or care team how to get rid of this medication safely. NOTE: This sheet is a summary. It may not cover all possible information. If you have questions about this medicine, talk to your doctor, pharmacist, or health care provider.  2023 Elsevier/Gold Standard (2013-09-06 00:00:00)     Signed,   Merri Ray, MD [place], [facility] 09/15/22 3:01 PM

## 2022-09-15 NOTE — Patient Instructions (Addendum)
Swelling can be related to meds or salt in diet. See info below and handout. If continues, then we may need to change nifedipine to other medicine.  Check blood sugars, add new med once per week.  Recheck in 1 month. Return to the clinic or go to the nearest emergency room if any of your symptoms worsen or new symptoms occur.  Peripheral Edema  Peripheral edema is swelling that is caused by a buildup of fluid. Peripheral edema most often affects the lower legs, ankles, and feet. It can also develop in the arms, hands, and face. The area of the body that has peripheral edema will look swollen. It may also feel heavy or warm. Your clothes may start to feel tight. Pressing on the area may make a temporary dent in your skin (pitting edema). You may not be able to move your swollen arm or leg as much as usual. There are many causes of peripheral edema. It can happen because of a complication of other conditions such as heart failure, kidney disease, or a problem with your circulation. It also can be a side effect of certain medicines or happen because of an infection. It often happens to women during pregnancy. Sometimes, the cause is not known. Follow these instructions at home: Managing pain, stiffness, and swelling  Raise (elevate) your legs while you are sitting or lying down. Move around often to prevent stiffness and to reduce swelling. Do not sit or stand for long periods of time. Do not wear tight clothing. Do not wear garters on your upper legs. Exercise your legs to get your circulation going. This helps to move the fluid back into your blood vessels, and it may help the swelling go down. Wear compression stockings as told by your health care provider. These stockings help to prevent blood clots and reduce swelling in your legs. It is important that these are the correct size. These stockings should be prescribed by your doctor to prevent possible injuries. If elastic bandages or wraps are  recommended, use them as told by your health care provider. Medicines Take over-the-counter and prescription medicines only as told by your health care provider. Your health care provider may prescribe medicine to help your body get rid of excess water (diuretic). Take this medicine if you are told to take it. General instructions Eat a low-salt (low-sodium) diet as told by your health care provider. Sometimes, eating less salt may reduce swelling. Pay attention to any changes in your symptoms. Moisturize your skin daily to help prevent skin from cracking and draining. Keep all follow-up visits. This is important. Contact a health care provider if: You have a fever. You have swelling in only one leg. You have increased swelling, redness, or pain in one or both of your legs. You have drainage or sores at the area where you have edema. Get help right away if: You have edema that starts suddenly or is getting worse, especially if you are pregnant or have a medical condition. You develop shortness of breath, especially when you are lying down. You have pain in your chest or abdomen. You feel weak. You feel like you will faint. These symptoms may be an emergency. Get help right away. Call 911. Do not wait to see if the symptoms will go away. Do not drive yourself to the hospital. Summary Peripheral edema is swelling that is caused by a buildup of fluid. Peripheral edema most often affects the lower legs, ankles, and feet. Move around often to  prevent stiffness and to reduce swelling. Do not sit or stand for long periods of time. Pay attention to any changes in your symptoms. Contact a health care provider if you have edema that starts suddenly or is getting worse, especially if you are pregnant or have a medical condition. Get help right away if you develop shortness of breath, especially when lying down. This information is not intended to replace advice given to you by your health care  provider. Make sure you discuss any questions you have with your health care provider. Document Revised: 08/09/2021 Document Reviewed: 08/09/2021 Elsevier Patient Education  [facility] Injection What is this medication? DULAGLUTIDE (DOO la GLOO tide) treats type 2 diabetes. It works by increasing insulin levels in your body, which decreases your blood sugar (glucose). It also reduces the amount of sugar released into your blood and slows down your digestion. It can also be used to lower the risk of heart attack and stroke in people with type 2 diabetes. Changes to diet and exercise are often combined with this medication. This medicine may be used for other purposes; ask your health care provider or pharmacist if you have questions. COMMON BRAND NAME(S): Trulicity What should I tell my care team before I take this medication? They need to know if you have any of these conditions: Endocrine tumors (MEN 2) or if someone in your family had these tumors Eye disease, vision problems History of pancreatitis Kidney disease Liver disease Stomach or intestine problems Thyroid cancer or if someone in your family had thyroid cancer An unusual or allergic reaction to dulaglutide, other medications, foods, dyes, or preservatives Pregnant or trying to get pregnant Breast-feeding How should I use this medication? This medication is injected under the skin. You will be taught how to prepare and give it. Take it as directed on the prescription label on the same day of each week. Do NOT prime the pen. Keep taking it unless your care team tells you to stop. If you use this medication with insulin, you should inject this medication and the insulin separately. Do not mix them together. Do not give the injections right next to each other. Change (rotate) injection sites with each injection. This medication comes with INSTRUCTIONS FOR USE. Ask your pharmacist for directions on how to use  this medication. Read the information carefully. Talk to your pharmacist or care team if you have questions. It is important that you put your used needles and syringes in a special sharps container. Do not put them in a trash can. If you do not have a sharps container, call your pharmacist or care team to get one. A special MedGuide will be given to you by the pharmacist with each prescription and refill. Be sure to read this information carefully each time. Talk to your care team about the use of this medication in children. While it may be prescribed for children as young as 10 years for selected conditions, precautions do apply. Overdosage: If you think you have taken too much of this medicine contact a poison control center or emergency room at once. NOTE: This medicine is only for you. Do not share this medicine with others. What if I miss a dose? If you miss a dose, take it as soon as you can unless it is more than 3 days late. If it is more than 3 days late, skip the missed dose. Take the next dose at the normal time. What may interact with  this medication? Other medications for diabetes Many medications may cause changes in blood sugar, these include: Alcohol containing beverages Antiviral medications for HIV or AIDS Aspirin and aspirin-like medications Certain medications for blood pressure, heart disease, irregular heart beat Chromium Diuretics Male hormones, such as estrogens or progestins, birth control pills Fenofibrate Gemfibrozil Isoniazid Lanreotide Male hormones or anabolic steroids MAOIs like Carbex, Eldepryl, Marplan, Nardil, and Parnate Medications for allergies, asthma, cold, or cough Medications for depression, anxiety, or psychotic disturbances Medications for weight loss Niacin Nicotine NSAIDs, medications for pain and inflammation, like ibuprofen or naproxen Octreotide Pasireotide Pentamidine Phenytoin Probenecid Quinolone antibiotics such as  ciprofloxacin, levofloxacin, ofloxacin Some herbal dietary supplements Steroid medications such as prednisone or cortisone Sulfamethoxazole; trimethoprim Thyroid hormones Some medications can hide the warning symptoms of low blood sugar (hypoglycemia). You may need to monitor your blood sugar more closely if you are taking one of these medications. These include: Beta-blockers, often used for high blood pressure or heart problems (examples include atenolol, metoprolol, propranolol) Clonidine Guanethidine Reserpine This list may not describe all possible interactions. Give your health care provider a list of all the medicines, herbs, non-prescription drugs, or dietary supplements you use. Also tell them if you smoke, drink alcohol, or use illegal drugs. Some items may interact with your medicine. What should I watch for while using this medication? Visit your care team for regular checks on your progress. Check with your care team if you have severe diarrhea, nausea, and vomiting, or if you sweat a lot. The loss of too much body fluid may make it dangerous for you to take this medication. A test called the HbA1C (A1C) will be monitored. This is a simple blood test. It measures your blood sugar control over the last 2 to 3 months. You will receive this test every 3 to 6 months. Learn how to check your blood sugar. Learn the symptoms of low and high blood sugar and how to manage them. Always carry a quick-source of sugar with you in case you have symptoms of low blood sugar. Examples include hard sugar candy or glucose tablets. Make sure others know that you can choke if you eat or drink when you develop serious symptoms of low blood sugar, such as seizures or unconsciousness. Get medical help at once. Tell your care team if you have high blood sugar. You might need to change the dose of your medication. If you are sick or exercising more than usual, you may need to change the dose of your  medication. Do not skip meals. Ask your care team if you should avoid alcohol. Many nonprescription cough and cold products contain sugar or alcohol. These can affect blood sugar. Pens should never be shared. Even if the needle is changed, sharing may result in passing of viruses like hepatitis or HIV. Wear a medical ID bracelet or chain. Carry a card that describes your condition. List the medications and doses you take on the card. What side effects may I notice from receiving this medication? Side effects that you should report to your care team as soon as possible: Allergic reactions--skin rash, itching, hives, swelling of the face, lips, tongue, or throat Change in vision Dehydration--increased thirst, dry mouth, feeling faint or lightheaded, headache, dark yellow or brown urine Kidney injury--decrease in the amount of urine, swelling of the ankles, hands, or feet Pancreatitis--severe stomach pain that spreads to your back or gets worse after eating or when touched, fever, nausea, vomiting Thyroid cancer--new mass or lump in  the neck, pain or trouble swallowing, trouble breathing, hoarseness Side effects that usually do not require medical attention (report to your care team if they continue or are bothersome): Diarrhea Loss of appetite Nausea Stomach pain Vomiting This list may not describe all possible side effects. Call your doctor for medical advice about side effects. You may report side effects to FDA at 1-800-FDA-1088. Where should I keep my medication? Keep out of the reach of children and pets. Refrigeration (preferred): Store unopened pens in a refrigerator between 2 and 8 degrees C (36 and 46 degrees F). Keep it in the original carton until you are ready to take it. Do not freeze or use if the medication has been frozen. Protect from light. Get rid of any unused medication after the expiration date on the label. Room Temperature: The pen may be stored at room temperature below  30 degrees C (86 degrees F) for up to a total of 14 days if needed. Protect from light. Avoid exposure to extreme heat. If it is stored at room temperature, throw away any unused medication after 14 days or after it expires, whichever is first. To get rid of medications that are no longer needed or have expired: Take the medication to a medication take-back program. Check with your pharmacy or law enforcement to find a location. If you cannot return the medication, ask your pharmacist or care team how to get rid of this medication safely. NOTE: This sheet is a summary. It may not cover all possible information. If you have questions about this medicine, talk to your doctor, pharmacist, or health care provider.  2023 Elsevier/Gold Standard (2013-09-06 00:00:00)

## 2022-09-23 ENCOUNTER — Other Ambulatory Visit: Payer: Self-pay | Admitting: Family Medicine

## 2022-09-23 DIAGNOSIS — E876 Hypokalemia: Secondary | ICD-10-CM

## 2022-09-23 NOTE — Progress Notes (Signed)
See lab notes. Planned labs at [facility] in 4 days.

## 2022-10-05 ENCOUNTER — Other Ambulatory Visit (INDEPENDENT_AMBULATORY_CARE_PROVIDER_SITE_OTHER): Payer: 59

## 2022-10-05 DIAGNOSIS — E876 Hypokalemia: Secondary | ICD-10-CM | POA: Diagnosis not present

## 2022-10-05 LAB — BASIC METABOLIC PANEL
BUN: 17 mg/dL (ref 6–23)
CO2: 28 mEq/L (ref 19–32)
Calcium: 9.4 mg/dL (ref 8.4–10.5)
Chloride: 101 mEq/L (ref 96–112)
Creatinine, Ser: 1 mg/dL (ref 0.40–1.50)
GFR: 83.81 mL/min (ref >60.00)
Glucose, Bld: 171 mg/dL — ABNORMAL HIGH (ref 70–99)
Potassium: 3 mEq/L — ABNORMAL LOW (ref 3.5–5.1)
Sodium: 138 mEq/L (ref 135–145)

## 2022-10-14 ENCOUNTER — Ambulatory Visit (INDEPENDENT_AMBULATORY_CARE_PROVIDER_SITE_OTHER): Payer: 59 | Admitting: Family Medicine

## 2022-10-14 ENCOUNTER — Encounter: Payer: Self-pay | Admitting: Family Medicine

## 2022-10-14 ENCOUNTER — Telehealth: Payer: Self-pay | Admitting: Family Medicine

## 2022-10-14 VITALS — BP 124/80 | HR 69 | Temp 98.0°F | Ht 73.0 in | Wt 230.0 lb

## 2022-10-14 DIAGNOSIS — I10 Essential (primary) hypertension: Secondary | ICD-10-CM

## 2022-10-14 DIAGNOSIS — N1832 Chronic kidney disease, stage 3b: Secondary | ICD-10-CM

## 2022-10-14 DIAGNOSIS — E1165 Type 2 diabetes mellitus with hyperglycemia: Secondary | ICD-10-CM | POA: Diagnosis not present

## 2022-10-14 DIAGNOSIS — E876 Hypokalemia: Secondary | ICD-10-CM

## 2022-10-14 DIAGNOSIS — R600 Localized edema: Secondary | ICD-10-CM | POA: Diagnosis not present

## 2022-10-14 MED ORDER — NIFEDIPINE ER OSMOTIC RELEASE 60 MG PO TB24: 60.0000 mg | ORAL_TABLET | Freq: Every day | ORAL | 1 refills | Status: DC

## 2022-10-14 MED ORDER — LOSARTAN POTASSIUM 25 MG PO TABS: 25.0000 mg | ORAL_TABLET | Freq: Every day | ORAL | 1 refills | Status: DC

## 2022-10-14 MED ORDER — POTASSIUM CHLORIDE ER 10 MEQ PO TBCR: 20.0000 meq | EXTENDED_RELEASE_TABLET | Freq: Every day | ORAL | 1 refills | Status: DC

## 2022-10-14 NOTE — Patient Instructions (Addendum)
Start trulicity, no other diabetes med changes for today.  Start losartan once per day, and new lower dose of nifedipine. Recheck in 1 month. If low or high blood pressures be seen sooner.  Take 3 potassium pills today, then 2 every day - this will be new dose. Lab check in 1 week.  I will also refer you to diabetic educator.   Dulaglutide Injection What is this medication? DULAGLUTIDE (DOO la GLOO tide) treats type 2 diabetes. It works by increasing insulin levels in your body, which decreases your blood sugar (glucose). It also reduces the amount of sugar released into your blood and slows down your digestion. It can also be used to lower the risk of heart attack and stroke in people with type 2 diabetes. Changes to diet and exercise are often combined with this medication. This medicine may be used for other purposes; ask your health care provider or pharmacist if you have questions. COMMON BRAND NAME(S): Trulicity What should I tell my care team before I take this medication? They need to know if you have any of these conditions: Endocrine tumors (MEN 2) or if someone in your family had these tumors Eye disease, vision problems History of pancreatitis Kidney disease Liver disease Stomach or intestine problems Thyroid cancer or if someone in your family had thyroid cancer An unusual or allergic reaction to dulaglutide, other medications, foods, dyes, or preservatives Pregnant or trying to get pregnant Breast-feeding How should I use this medication? This medication is injected under the skin. You will be taught how to prepare and give it. Take it as directed on the prescription label on the same day of each week. Do NOT prime the pen. Keep taking it unless your care team tells you to stop. If you use this medication with insulin, you should inject this medication and the insulin separately. Do not mix them together. Do not give the injections right next to each other. Change (rotate)  injection sites with each injection. This medication comes with INSTRUCTIONS FOR USE. Ask your pharmacist for directions on how to use this medication. Read the information carefully. Talk to your pharmacist or care team if you have questions. It is important that you put your used needles and syringes in a special sharps container. Do not put them in a trash can. If you do not have a sharps container, call your pharmacist or care team to get one. A special MedGuide will be given to you by the pharmacist with each prescription and refill. Be sure to read this information carefully each time. Talk to your care team about the use of this medication in children. While it may be prescribed for children as young as 10 years for selected conditions, precautions do apply. Overdosage: If you think you have taken too much of this medicine contact a poison control center or emergency room at once. NOTE: This medicine is only for you. Do not share this medicine with others. What if I miss a dose? If you miss a dose, take it as soon as you can unless it is more than 3 days late. If it is more than 3 days late, skip the missed dose. Take the next dose at the normal time. What may interact with this medication? Other medications for diabetes Many medications may cause changes in blood sugar, these include: Alcohol Antiviral medications for HIV or AIDS Aspirin and aspirin-like medications Certain medications for blood pressure, heart disease, irregular heartbeat Chromium Diuretics Estrogen or progestin hormones Fenofibrate  Gemfibrozil Isoniazid Lanreotide MAOIs, such as Carbex, Eldepryl, Marplan, Nardil, or Parnate Medications for allergies, asthma, cold, or cough Medications for mental health conditions Medications for weight loss Niacin Nicotine NSAIDs, medications for pain and inflammation, such as ibuprofen or naproxen Octreotide Pasireotide Pentamidine Phenytoin Probenecid Quinolone  antibiotics such as ciprofloxacin, levofloxacin, or ofloxacin Some herbal dietary supplements Steroid medications, such as prednisone or cortisone Sulfamethoxazole; trimethoprim Testosterone or anabolic steroids Thyroid hormones Some medications can hide the warning symptoms of low blood sugar (hypoglycemia). You may need to monitor your blood sugar more closely if you are taking one of these medications. These include: Beta blockers, such as atenolol, metoprolol, or propranolol Clonidine Guanethidine Reserpine This list may not describe all possible interactions. Give your health care provider a list of all the medicines, herbs, non-prescription drugs, or dietary supplements you use. Also tell them if you smoke, drink alcohol, or use illegal drugs. Some items may interact with your medicine. What should I watch for while using this medication? Visit your care team for regular checks on your progress. Check with your care team if you have severe diarrhea, nausea, and vomiting, or if you sweat a lot. The loss of too much body fluid may make it dangerous for you to take this medication. A test called the HbA1C (A1C) will be monitored. This is a simple blood test. It measures your blood sugar control over the last 2 to 3 months. You will receive this test every 3 to 6 months. Learn how to check your blood sugar. Learn the symptoms of low and high blood sugar and how to manage them. Always carry a quick-source of sugar with you in case you have symptoms of low blood sugar. Examples include hard sugar candy or glucose tablets. Make sure others know that you can choke if you eat or drink when you develop serious symptoms of low blood sugar, such as seizures or unconsciousness. Get medical help at once. Tell your care team if you have high blood sugar. You might need to change the dose of your medication. If you are sick or exercising more than usual, you may need to change the dose of your  medication. Do not skip meals. Ask your care team if you should avoid alcohol. Many nonprescription cough and cold products contain sugar or alcohol. These can affect blood sugar. Pens should never be shared. Even if the needle is changed, sharing may result in passing of viruses like hepatitis or HIV. Wear a medical ID bracelet or chain. Carry a card that describes your condition. List the medications and doses you take on the card. What side effects may I notice from receiving this medication? Side effects that you should report to your care team as soon as possible: Allergic reactions--skin rash, itching, hives, swelling of the face, lips, tongue, or throat Change in vision Dehydration--increased thirst, dry mouth, feeling faint or lightheaded, headache, dark yellow or brown urine Kidney injury--decrease in the amount of urine, swelling of the ankles, hands, or feet Pancreatitis--severe stomach pain that spreads to your back or gets worse after eating or when touched, fever, nausea, vomiting Thyroid cancer--new mass or lump in the neck, pain or trouble swallowing, trouble breathing, hoarseness Side effects that usually do not require medical attention (report to your care team if they continue or are bothersome): Diarrhea Loss of appetite Nausea Stomach pain Vomiting This list may not describe all possible side effects. Call your doctor for medical advice about side effects. You may report side  effects to FDA at 1-800-FDA-1088. Where should I keep my medication? Keep out of the reach of children and pets. Refrigeration (preferred): Store unopened pens in a refrigerator between 2 and 8 degrees C (36 and 46 degrees F). Keep it in the original carton until you are ready to take it. Do not freeze or use if the medication has been frozen. Protect from light. Get rid of any unused medication after the expiration date on the label. Room Temperature: The pen may be stored at room temperature below  30 degrees C (86 degrees F) for up to a total of 14 days if needed. Protect from light. Avoid exposure to extreme heat. If it is stored at room temperature, throw away any unused medication after 14 days or after it expires, whichever is first. To get rid of medications that are no longer needed or have expired: Take the medication to a medication take-back program. Check with your pharmacy or law enforcement to find a location. If you cannot return the medication, ask your pharmacist or care team how to get rid of this medication safely. NOTE: This sheet is a summary. It may not cover all possible information. If you have questions about this medicine, talk to your doctor, pharmacist, or health care provider.  2023 Elsevier/Gold Standard (2013-09-06 00:00:00)  Incretins In this video, you will learn about a category of diabetes medicine called incretins. Information includes how these medicines work in your body, why they might be prescribed to you, and precautions you should know about when taking this type of medicine. To view the content, go to this web address: https://pe.elsevier.OBS/[email or web address]  This video will expire on: 08/23/2024. If you need access to this video following this date, please reach out to the healthcare provider who assigned it to you. This information is not intended to replace advice given to you by your health care provider. Make sure you discuss any questions you have with your health care provider. Elsevier Patient Education  [place].    Hypokalemia Hypokalemia means that the amount of potassium in the blood is lower than normal. Potassium is a mineral (electrolyte) that helps regulate the amount of fluid in the body. It also stimulates muscle tightening (contraction) and helps nerves work properly. Normally, most of the body's potassium is inside cells, and only a very small amount is in the blood. Because the amount in the blood is so small, minor changes  to potassium levels in the blood can be life-threatening. What are the causes? This condition may be caused by: Antibiotic medicine. Diarrhea or vomiting. Taking too much of a medicine that helps you have a bowel movement (laxative) can cause diarrhea and lead to hypokalemia. Chronic kidney disease (CKD). Medicines that help the body get rid of excess fluid (diuretics). Eating disorders, such as anorexia or bulimia. Low magnesium levels in the body. Sweating a lot. What are the signs or symptoms? Symptoms of this condition include: Weakness. Constipation. Fatigue. Muscle cramps. Mental confusion. Skipped heartbeats or irregular heartbeat (palpitations). Tingling or numbness. How is this diagnosed? This condition is diagnosed with a blood test. How is this treated? This condition may be treated by: Taking potassium supplements. Adjusting the medicines that you take. Eating more foods that contain a lot of potassium. If your potassium level is very low, you may need to get potassium through an IV and be monitored in the hospital. Follow these instructions at home: Eating and drinking  Eat a healthy diet. A healthy diet includes  fresh fruits and vegetables, whole grains, healthy fats, and lean proteins. If told, eat more foods that contain a lot of potassium. These include: Nuts, such as peanuts and pistachios. Seeds, such as sunflower seeds and pumpkin seeds. Peas, lentils, and lima beans. Whole grain and bran cereals and breads. Fresh fruits and vegetables, such as apricots, avocado, bananas, cantaloupe, kiwi, oranges, tomatoes, asparagus, and potatoes. Juices, such as orange, tomato, and prune. Lean meats, including fish. Milk and milk products, such as yogurt. General instructions Take over-the-counter and prescription medicines only as told by your health care provider. This includes vitamins, natural food products, and supplements. Keep all follow-up visits. This is  important. Contact a health care provider if: You have weakness that gets worse. You feel your heart pounding or racing. You vomit. You have diarrhea. You have diabetes and you have trouble keeping your blood sugar in your target range. Get help right away if: You have chest pain. You have shortness of breath. You have vomiting or diarrhea that lasts for more than 2 days. You faint. These symptoms may be an emergency. Get help right away. Call 911. Do not wait to see if the symptoms will go away. Do not drive yourself to the hospital. Summary Hypokalemia means that the amount of potassium in the blood is lower than normal. This condition is diagnosed with a blood test. Hypokalemia may be treated by taking potassium supplements, adjusting the medicines that you take, or eating more foods that are high in potassium. If your potassium level is very low, you may need to get potassium through an IV and be monitored in the hospital. This information is not intended to replace advice given to you by your health care provider. Make sure you discuss any questions you have with your health care provider. Document Revised: 08/19/2021 Document Reviewed: 08/19/2021 Elsevier Patient Education  2023 [employer].

## 2022-10-14 NOTE — Progress Notes (Unsigned)
Subjective:  Patient ID: Dylan Abbott, male    DOB: Nov 06, 1966  Age: 56 y.o. MRN: [id number]  CC:  Chief Complaint  Patient presents with   Diabetes    Pt states he dont know how to use the meds that was ordered for him    HPI Dylan Abbott presents for   Diabetes: Complicated by hyperglycemia, CKD, and medication nonadherence, some difficulty with medication coverage.  Previously treated with Lantus but also cost prohibitive.  Last visit September 28.  He was continue to take metformin and glipizide, we tried to add [place] but that was cost prohibitive.  He had restarted his simvastatin.  Trulicity ordered, 6.16 mg weekly initially, and home monitor ordered.Dylan Abbott   Has not started Trulicity, not sure how to use. He did purchase - not cost prohibitive.  Still taking glipizide BID, metformin BID.  Using meter - 160 yesterday - random.  No symptomatic lows. Highest reading 250 earlier this week with eating the wrong food.  On statin as above ACE/ARB, low-dose losartan ordered by nephrology earlier this year.  Not taking - ran out. Was tolerating when taken.  Microalbumin: 14 on 09/08/21.Dylan Abbott  Optho, foot exam, pneumovax: utd.   Lab Results  Component Value Date   HGBA1C 9.9 (A) 08/31/2022   HGBA1C 11.6 (H) 08/12/2021   HGBA1C 8.2 (H) 02/01/2021   Lab Results  Component Value Date   MICROALBUR 14.2 (H) 09/08/2021   LDLCALC 122 (H) 09/14/2022   CREATININE 1.00 10/05/2022   Pedal edema Discussed at his September 28 visit.  Handout given on avoidance of high sodium foods, did not change other meds, he does take nifedipine, losartan for hypertension.  He is not on diuretic. Still some pedal edema if prolonged standing, resolves overnight. No CP/dyspnea.   Hypokalemia Recurrent, chronic issue.  Supplementation has been recommended.  Reading of 3.0 on 09/14/2022, he reported taking 10 mg of potassium once per day at that time.  Asymptomatic.  I recommended increasing to 30 mill  equivalents once, then 20 mEq daily with 4-day follow-up testing. He temporarily increased to 2 per day for about a week, then back to one per day when had repeat testing noted from 10/05/2022, potassium same at 3.0.  No heart palpitations, HA, fatigue or new symptoms. Feels ok.  Taking potassium 59mq once per day.   History Patient Active Problem List   Diagnosis Date Noted   Impingement syndrome of left shoulder 07/30/2020   COVID-19 virus infection 10/31/2019   Abnormal liver function 10/31/2019   Hypokalemia 10/31/2019   Hyponatremia 10/31/2019   Diabetes mellitus ([place] 11/13/2017   Hyperlipidemia 11/13/2017   Hypertensive disorder 11/13/2017   Past Medical History:  Diagnosis Date   Diabetes mellitus without complication ([place]    Hyperlipidemia    Hypertension    No past surgical history on file. No Known Allergies Prior to Admission medications   Medication Sig Start Date End Date Taking? Authorizing Provider  Accu-Chek FastClix Lancets MISC USE TO CHECK BLOOD SUGAR EVERY DAY 09/08/21  Yes GWendie Agreste MD  blood glucose meter kit and supplies Check once per day. 09/15/22  Yes GWendie Agreste MD  glipiZIDE (GLUCOTROL) 10 MG tablet Take 1 tablet (10 mg total) by mouth 2 (two) times daily before a meal. 08/31/22  Yes GWendie Agreste MD  glucose blood (ACCU-CHEK GUIDE) test strip USE TO CHECK BLOOD SUGAR twice per  DAY Dispense with meter of choice. 09/15/22  Yes GWendie Agreste MD  metFORMIN (  GLUCOPHAGE) 850 MG tablet Take 1 tablet (850 mg total) by mouth 2 (two) times daily with a meal. 08/31/22  Yes [name], MD  NIFEdipine (PROCARDIA XL/NIFEDICAL-XL) 90 MG 24 hr tablet TAKE 1 TABLET(90 MG) BY MOUTH DAILY 08/31/22  Yes [name], MD  potassium chloride (KLOR-CON) 10 MEQ tablet Take 1 tablet (10 mEq total) by mouth daily. 08/31/22  Yes [name], MD  simvastatin (ZOCOR) 80 MG tablet TAKE 1 TABLET(80 MG) BY MOUTH DAILY AT 6 PM 08/31/22  Yes  [name], MD  Dulaglutide (TRULICITY) 3.50 KX/3.8HW SOPN Inject 0.75 mg into the skin once a week. Patient not taking: Reported on 10/14/2022 09/15/22   [name], MD  Insulin Pen Needle (PEN NEEDLES) 32G X 4 MM MISC 1 application by Does not apply route daily. Patient not taking: Reported on 08/31/2022 08/27/21   [name], MD  losartan (COZAAR) 25 MG tablet Take 25 mg by mouth daily. 09/21/22   [provider]   Social History   Socioeconomic History   Marital status: Married    Spouse name: Not on file   Number of children: 2   Years of education: Not on file   Highest education level: Not on file  Occupational History   Occupation: environmental  Tobacco Use   Smoking status: Never   Smokeless tobacco: Never  Substance and Sexual Activity   Alcohol use: Never    Alcohol/week: 0.0 standard drinks of alcohol   Drug use: Never   Sexual activity: Yes  Other Topics Concern   Not on file  Social History Narrative   Not on file   Social Determinants of Health   Financial Resource Strain: Not on file  Food Insecurity: Not on file  Transportation Needs: Not on file  Physical Activity: Not on file  Stress: Not on file  Social Connections: Not on file  Intimate Partner Violence: Not on file    Review of Systems   Objective:   Vitals:   10/14/22 1040  BP: 124/80  Pulse: 69  Temp: 98 F (36.7 C)  SpO2: 98%  Weight: 230 lb (104.3 kg)  Height: _0  (1.854 m)     Physical Exam     Assessment & Plan:  Dylan Abbott is a 56 y.o. male . Type 2 diabetes mellitus with hyperglycemia, without long-term current use of insulin (HCC)   No orders of the defined types were placed in this encounter.  There are no Patient Instructions on file for this visit.    Signed,   Merri Ray, MD [place] 10/14/22 10:56 AM

## 2022-10-14 NOTE — Telephone Encounter (Signed)
Labs are already future he can go when he is ready

## 2022-10-14 NOTE — Telephone Encounter (Signed)
Dr.Greene wants pt to get lab work done in one week.  Pt wants wants to go to [facility].

## 2022-10-15 ENCOUNTER — Encounter: Payer: Self-pay | Admitting: Family Medicine

## 2022-11-03 LAB — HM DIABETES EYE EXAM

## 2022-11-14 ENCOUNTER — Telehealth: Payer: Self-pay | Admitting: Lab

## 2022-11-14 NOTE — Telephone Encounter (Signed)
Received paperwork from [facility] and put it in Dr. Paralee Cancel folder for review

## 2022-11-15 ENCOUNTER — Encounter: Payer: Self-pay | Admitting: Family Medicine

## 2022-11-16 ENCOUNTER — Encounter: Payer: Self-pay | Admitting: Family Medicine

## 2022-11-16 ENCOUNTER — Ambulatory Visit (INDEPENDENT_AMBULATORY_CARE_PROVIDER_SITE_OTHER): Payer: 59 | Admitting: Family Medicine

## 2022-11-16 VITALS — BP 128/60 | HR 77 | Temp 97.7°F | Ht 73.0 in | Wt 233.2 lb

## 2022-11-16 DIAGNOSIS — E876 Hypokalemia: Secondary | ICD-10-CM

## 2022-11-16 DIAGNOSIS — N1832 Chronic kidney disease, stage 3b: Secondary | ICD-10-CM | POA: Diagnosis not present

## 2022-11-16 DIAGNOSIS — E1165 Type 2 diabetes mellitus with hyperglycemia: Secondary | ICD-10-CM

## 2022-11-16 MED ORDER — TRULICITY 1.5 MG/0.5ML ~~LOC~~ SOAJ: 1.5000 mg | SUBCUTANEOUS | 2 refills | Status: DC

## 2022-11-16 NOTE — Patient Instructions (Signed)
Increase trulicity to 1.5mg  dose once per week. No other changes for now. Recheck in 1 month.  Continue 2 potassium pills per day. If any concerns on labs I will let you know.

## 2022-11-16 NOTE — Progress Notes (Signed)
Subjective:  Patient ID: Dylan Abbott, male    DOB: Nov 01, 1966  Age: 56 y.o. MRN: [id number]  CC:  Chief Complaint  Patient presents with   Diabetes    Pt states all is well    HPI Dylan Abbott presents for    Diabetes: Complicated by CKD, hyperglycemia, prior medication nonadherence.  Some difficulty with medication coverage previously.  Follow-up from October 27 visit.  Had not yet started Trulicity at that time, in office teaching performed.  Started at 0.75 mg weekly initially.  Home monitor was ordered.  Was continued to take glipizide and metformin.  He was on ARB with losartan ordered by nephrology.  Ran out.  Did tolerate when he was taking meds.  Losartan 25 mg restarted last visit.  Referred to diabetic educator - had call - unknown appt, and has seen optho.  Microalbumin: 14.2 on 09/08/2021  Has been using trulicity injection once per week. No difficulty. Still taking metformin, glipizide.  Still some high readings - random, not fasting. 200, 180, 170.   Lab Results  Component Value Date   HGBA1C 9.9 (A) 08/31/2022   HGBA1C 11.6 (H) 08/12/2021   HGBA1C 8.2 (H) 02/01/2021   Lab Results  Component Value Date   MICROALBUR 14.2 (H) 09/08/2021   LDLCALC 122 (H) 09/14/2022   CREATININE 1.00 10/05/2022   Pedal edema Improved at last visit, noted with prolonged standing but resolved overnight, avoidance of high sodium foods discussed previously.  Hypokalemia Instructions on supplementation discussed last visit with understanding expressed.  Had been taking 10 mill equivalents at his last visit.  His potassium was increased to 20 mg daily with initial boost of 30 mill equivalents for 1 day.  Plan on 1 week lab visit.  That has not occurred. Taking 2 pills per day since last visit. No cheat pain, some soreness in chest muscle with work only at times, no palpitations.  Lab Results  Component Value Date   K 3.0 (L) 10/05/2022     History Patient Active Problem List    Diagnosis Date Noted   Impingement syndrome of left shoulder 07/30/2020   COVID-19 virus infection 10/31/2019   Abnormal liver function 10/31/2019   Hypokalemia 10/31/2019   Hyponatremia 10/31/2019   Diabetes mellitus ([place]) 11/13/2017   Hyperlipidemia 11/13/2017   Hypertensive disorder 11/13/2017   Past Medical History:  Diagnosis Date   Diabetes mellitus without complication ([place])    Hyperlipidemia    Hypertension    No past surgical history on file. No Known Allergies Prior to Admission medications   Medication Sig Start Date End Date Taking? Authorizing Provider  Accu-Chek FastClix Lancets MISC USE TO CHECK BLOOD SUGAR EVERY DAY 09/08/21  Yes [name], MD  blood glucose meter kit and supplies Check once per day. 09/15/22  Yes [name], MD  Dulaglutide (TRULICITY) 6.43 PI/9.5JO SOPN Inject 0.75 mg into the skin once a week. 09/15/22  Yes [name], MD  glipiZIDE (GLUCOTROL) 10 MG tablet Take 1 tablet (10 mg total) by mouth 2 (two) times daily before a meal. 08/31/22  Yes [name], MD  glucose blood (ACCU-CHEK GUIDE) test strip USE TO CHECK BLOOD SUGAR twice per  DAY Dispense with meter of choice. 09/15/22  Yes [name], MD  Insulin Pen Needle (PEN NEEDLES) 32G X 4 MM MISC 1 application by Does not apply route daily. 08/27/21  Yes [name], MD  losartan (COZAAR) 25 MG tablet Take 1 tablet (25  mg total) by mouth daily. 10/14/22  Yes [name], MD  metFORMIN (GLUCOPHAGE) 850 MG tablet Take 1 tablet (850 mg total) by mouth 2 (two) times daily with a meal. 08/31/22  Yes [name], MD  NIFEdipine (PROCARDIA XL/NIFEDICAL XL) 60 MG 24 hr tablet Take 1 tablet (60 mg total) by mouth daily. 10/14/22  Yes [name], MD  potassium chloride (KLOR-CON) 10 MEQ tablet Take 2 tablets (20 mEq total) by mouth daily. 10/14/22  Yes [name], MD  simvastatin (ZOCOR) 80 MG tablet TAKE 1 TABLET(80 MG) BY MOUTH DAILY AT 6 PM  08/31/22  Yes [name], MD   Social History   Socioeconomic History   Marital status: Married    Spouse name: Not on file   Number of children: 2   Years of education: Not on file   Highest education level: Not on file  Occupational History   Occupation: environmental  Tobacco Use   Smoking status: Never   Smokeless tobacco: Never  Substance and Sexual Activity   Alcohol use: Never    Alcohol/week: 0.0 standard drinks of alcohol   Drug use: Never   Sexual activity: Yes  Other Topics Concern   Not on file  Social History Narrative   Not on file   Social Determinants of Health   Financial Resource Strain: Not on file  Food Insecurity: Not on file  Transportation Needs: Not on file  Physical Activity: Not on file  Stress: Not on file  Social Connections: Not on file  Intimate Partner Violence: Not on file    Review of Systems  Constitutional:  Negative for fatigue and unexpected weight change.  Eyes:  Negative for visual disturbance.  Respiratory:  Negative for cough, chest tightness and shortness of breath.   Cardiovascular:  Negative for chest pain, palpitations and leg swelling.  Gastrointestinal:  Negative for abdominal pain and blood in stool.  Neurological:  Negative for dizziness, light-headedness and headaches.     Objective:   Vitals:   11/16/22 1335  BP: 128/60  Pulse: 77  Temp: 97.7 F (36.5 C)  SpO2: 97%  Weight: 233 lb 3.2 oz (105.8 kg)  Height: _0  (1.854 m)     Physical Exam Vitals reviewed.  Constitutional:      Appearance: He is well-developed.  HENT:     Head: Normocephalic and atraumatic.  Neck:     Vascular: No carotid bruit or JVD.  Cardiovascular:     Rate and Rhythm: Normal rate and regular rhythm.     Heart sounds: Normal heart sounds. No murmur heard. Pulmonary:     Effort: Pulmonary effort is normal.     Breath sounds: Normal breath sounds. No rales.  Musculoskeletal:     Right lower leg: No edema.     Left  lower leg: No edema.  Skin:    General: Skin is warm and dry.  Neurological:     Mental Status: He is alert and oriented to person, place, and time.  Psychiatric:        Mood and Affect: Mood normal.        Assessment & Plan:  Dylan Abbott is a 56 y.o. male . Hypokalemia - Plan: Basic metabolic panel  -Now taking 20 mEq potassium per day, check updated BMP.  Adjust dosage accordingly based on results.  Type 2 diabetes mellitus with hyperglycemia, without long-term current use of insulin (HCC) - Plan: Microalbumin / creatinine urine ratio, Dulaglutide (TRULICITY) 1.5  MG/0.5ML SOPN, Microalbumin / creatinine urine ratio  -Now on Trulicity, tolerating new med.  Still elevated readings at home as above.  Increase to 1.5 mg dose, continue other meds same doses for now with recheck in 1 month for A1c.  Check BMP.  Follow-up with diabetic educator as planned.  Stage 3b chronic kidney disease ([place]) - Plan: Basic metabolic panel  -Check BMP as above.  Meds ordered this encounter  Medications   Dulaglutide (TRULICITY) 1.5 VO/5.9YT SOPN    Sig: Inject 1.5 mg into the skin once a week.    Dispense:  6 mL    Refill:  2   Patient Instructions  Increase trulicity to 2.4MQ dose once per week. No other changes for now. Recheck in 1 month.  Continue 2 potassium pills per day. If any concerns on labs I will let you know.       Signed,   Merri Ray, MD [place] 11/16/22 2:10 PM

## 2022-11-17 LAB — BASIC METABOLIC PANEL
BUN: 25 mg/dL — ABNORMAL HIGH (ref 6–23)
CO2: 29 mEq/L (ref 19–32)
Calcium: 9.5 mg/dL (ref 8.4–10.5)
Chloride: 99 mEq/L (ref 96–112)
Creatinine, Ser: 1.4 mg/dL (ref 0.40–1.50)
GFR: 55.92 mL/min — ABNORMAL LOW (ref >60.00)
Glucose, Bld: 173 mg/dL — ABNORMAL HIGH (ref 70–99)
Potassium: 3.5 mEq/L (ref 3.5–5.1)
Sodium: 139 mEq/L (ref 135–145)

## 2022-11-17 LAB — MICROALBUMIN / CREATININE URINE RATIO
Creatinine,U: 77.9 mg/dL
Microalb Creat Ratio: 1.8 mg/g (ref 0.0–30.0)
Microalb, Ur: 1.4 mg/dL (ref 0.0–1.9)

## 2022-11-25 ENCOUNTER — Encounter: Payer: 59 | Attending: Family Medicine | Admitting: Dietician

## 2022-11-25 ENCOUNTER — Encounter: Payer: Self-pay | Admitting: Dietician

## 2022-11-25 VITALS — Ht 72.0 in | Wt 231.0 lb

## 2022-11-25 DIAGNOSIS — E118 Type 2 diabetes mellitus with unspecified complications: Secondary | ICD-10-CM | POA: Diagnosis present

## 2022-11-25 NOTE — Progress Notes (Signed)
Diabetes Self-Management Education  Visit Type: First/Initial  Appt. Start Time: 0905 Appt. End Time: 1015  11/25/2022  Mr. Dylan Abbott, identified by name and date of birth, is a 56 y.o. male with a diagnosis of Diabetes: Type 2.   ASSESSMENT Patient is here today alone.  He has had diabetes education about 10 years ago. Blood glucose 158 in the office fasting.  Referral:  Type 2 Diabetes, CKD, HTN, HLD History includes Type 2 diabetes (2004),  Medications:  Trulicity, glipizide, metformin, potassium Labs:  A1C 9.9% 08/31/2022 decreased from; 11.6% 07/2021, BUN 25, Creatinine 1.4, Potassium 3.5, GFR 55 11/16/2022 Sleep:  good Weight hx: 72" 231 lbs    Patient lives alone. 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 He does a lot of lifting at work. 3-5 days per week. He is from [place]. He does not cook often.  Eats meat but not daily.  Eats before work and at 2:30 am.    Height 6' (1.829 m), weight 231 lb (104.8 kg). Body mass index is 31.33 kg/m.   Diabetes Self-Management Education - 11/25/22 0918       Visit Information   Visit Type First/Initial      Initial Visit   Diabetes Type Type 2    Date Diagnosed 2004    Are you currently following a meal plan? No    Are you taking your medications as prescribed? Yes      Health Coping   How would you rate your overall health? Good      Psychosocial Assessment   Patient Belief/Attitude about Diabetes Motivated to manage diabetes    What is the hardest part about your diabetes right now, causing you the most concern, or is the most worrisome to you about your diabetes?   Checking blood sugar    Self-care barriers None    Self-management support Doctor's office    Other persons present Patient    Patient Concerns Nutrition/Meal planning;Healthy Lifestyle;Glycemic Control    Special Needs None    Preferred Learning Style No preference indicated    Learning Readiness Ready    How often do you need to have someone help  you when you read instructions, pamphlets, or other written materials from your doctor or pharmacy? 1 - Never    What is the last grade level you completed in school? 12      Pre-Education Assessment   Patient understands the diabetes disease and treatment process. Needs Review    Patient understands incorporating nutritional management into lifestyle. Needs Review    Patient undertands incorporating physical activity into lifestyle. Needs Review    Patient understands using medications safely. Needs Review    Patient understands monitoring blood glucose, interpreting and using results Needs Review    Patient understands prevention, detection, and treatment of acute complications. Needs Review    Patient understands prevention, detection, and treatment of chronic complications. Needs Review    Patient understands how to develop strategies to address psychosocial issues. Needs Review    Patient understands how to develop strategies to promote health/change behavior. Needs Review      Complications   Last HgB A1C per patient/outside source 9.9 %   08/31/2022   How often do you check your blood sugar? 3-4 times / week    Fasting Blood glucose range (mg/dL) 846-962    Number of hypoglycemic episodes per month 0    Have you had a dilated eye exam in the past 12 months? Yes  Have you had a dental exam in the past 12 months? No    Are you checking your feet? No      Dietary Intake   Breakfast bread, rice    Dinner potatoes, spinach, green beans   eats meat but none yesterday   Beverage(s) water, occasional juice, occasional regular soda,      Activity / Exercise   Activity / Exercise Type Light (walking / raking leaves)   work related only     Patient Education   Previous Diabetes Education Yes (please comment)   maybe 10 years ago   Disease Pathophysiology Explored patient's options for treatment of their diabetes;Definition of diabetes, type 1 and 2, and the diagnosis of diabetes     Healthy Eating Role of diet in the treatment of diabetes and the relationship between the three main macronutrients and blood glucose level;Plate Method;Meal options for control of blood glucose level and chronic complications.    Being Active Role of exercise on diabetes management, blood pressure control and cardiac health.    Medications Reviewed patients medication for diabetes, action, purpose, timing of dose and side effects.    Monitoring Identified appropriate SMBG and/or A1C goals.;Taught/discussed recording of test results and interpretation of SMBG.   introduced CGM's   Acute complications Taught prevention, symptoms, and  treatment of hypoglycemia - the 15 rule.;Discussed and identified patients' prevention, symptoms, and treatment of hyperglycemia.    Chronic complications Relationship between chronic complications and blood glucose control;Assessed and discussed foot care and prevention of foot problems;Retinopathy and reason for yearly dilated eye exams;Dental care    Diabetes Stress and Support Identified and addressed patients feelings and concerns about diabetes;Worked with patient to identify barriers to care and solutions;Role of stress on diabetes      Individualized Goals (developed by patient)   Nutrition General guidelines for healthy choices and portions discussed    Physical Activity Exercise 5-7 days per week;30 minutes per day    Medications take my medication as prescribed    Monitoring  Test my blood glucose as discussed    Problem Solving Eating Pattern;Addressing barriers to behavior change    Reducing Risk treat hypoglycemia with 15 grams of carbs if blood glucose less than 70mg /dL      Post-Education Assessment   Patient understands the diabetes disease and treatment process. Comprehends key points    Patient understands incorporating nutritional management into lifestyle. Comprehends key points    Patient undertands incorporating physical activity into lifestyle.  Comprehends key points    Patient understands using medications safely. Comphrehends key points    Patient understands monitoring blood glucose, interpreting and using results Comprehends key points    Patient understands prevention, detection, and treatment of acute complications. Comprehends key points    Patient understands prevention, detection, and treatment of chronic complications. Comprehends key points    Patient understands how to develop strategies to address psychosocial issues. Comprehends key points    Patient understands how to develop strategies to promote health/change behavior. Comprehends key points      Outcomes   Expected Outcomes Demonstrated interest in learning. Expect positive outcomes    Future DMSE PRN    Program Status Completed             Individualized Plan for Diabetes Self-Management Training:   Learning Objective:  Patient will have a greater understanding of diabetes self-management. Patient education plan is to attend individual and/or group sessions per assessed needs and concerns.   Plan:  Patient Instructions  Aim to be active most days.    Biking or walking  Continue to take your medication Avoid drinking regular soda and juice.  Drinking water is great! More non-starchy vegetables (half your plate should be vegetables)  Would your insurance company cover the [employer] or Dexcom?    Expected Outcomes:  Demonstrated interest in learning. Expect positive outcomes  Education material provided: ADA - How to Thrive: A Guide for Your Journey with Diabetes, Meal plan card, and Snack sheet, ACLM ([employer] of Lifestyle Medicine) packet   If problems or questions, patient to contact team via:  Phone  Future DSME appointment: PRN

## 2022-11-25 NOTE — Patient Instructions (Addendum)
Aim to be active most days.    Biking or walking  Continue to take your medication Avoid drinking regular soda and juice.  Drinking water is great! More non-starchy vegetables (half your plate should be vegetables)  Would your insurance company cover the [employer] or Dexcom?

## 2022-12-16 ENCOUNTER — Encounter: Payer: Self-pay | Admitting: Family Medicine

## 2022-12-16 ENCOUNTER — Ambulatory Visit (INDEPENDENT_AMBULATORY_CARE_PROVIDER_SITE_OTHER): Payer: 59 | Admitting: Family Medicine

## 2022-12-16 VITALS — BP 136/78 | HR 73 | Temp 98.4°F | Ht 72.0 in | Wt 227.8 lb

## 2022-12-16 DIAGNOSIS — Z1211 Encounter for screening for malignant neoplasm of colon: Secondary | ICD-10-CM | POA: Diagnosis not present

## 2022-12-16 DIAGNOSIS — E1165 Type 2 diabetes mellitus with hyperglycemia: Secondary | ICD-10-CM

## 2022-12-16 DIAGNOSIS — E876 Hypokalemia: Secondary | ICD-10-CM | POA: Diagnosis not present

## 2022-12-16 LAB — HEMOGLOBIN A1C: Hgb A1c MFr Bld: 8.1 % — ABNORMAL HIGH (ref 4.6–6.5)

## 2022-12-16 LAB — BASIC METABOLIC PANEL
BUN: 17 mg/dL (ref 6–23)
CO2: 29 mEq/L (ref 19–32)
Calcium: 9.7 mg/dL (ref 8.4–10.5)
Chloride: 101 mEq/L (ref 96–112)
Creatinine, Ser: 1.14 mg/dL (ref 0.40–1.50)
GFR: 71.51 mL/min (ref >60.00)
Glucose, Bld: 97 mg/dL (ref 70–99)
Potassium: 3.1 mEq/L — ABNORMAL LOW (ref 3.5–5.1)
Sodium: 139 mEq/L (ref 135–145)

## 2022-12-16 MED ORDER — METFORMIN HCL 850 MG PO TABS: 850.0000 mg | ORAL_TABLET | Freq: Two times a day (BID) | ORAL | 1 refills | Status: DC

## 2022-12-16 MED ORDER — GLIPIZIDE 10 MG PO TABS: 10.0000 mg | ORAL_TABLET | Freq: Two times a day (BID) | ORAL | 1 refills | Status: DC

## 2022-12-16 MED ORDER — TRULICITY 1.5 MG/0.5ML ~~LOC~~ SOAJ: 1.5000 mg | SUBCUTANEOUS | 2 refills | Status: DC

## 2022-12-16 NOTE — Progress Notes (Signed)
Subjective:  Patient ID: Dylan Abbott, male    DOB: 27-Jul-1966  Age: 56 y.o. MRN: [id number]  CC:  Chief Complaint  Patient presents with   Diabetes    Pt states all is well    HPI Dylan Abbott presents for   Diabetes: Follow-up appointment.  Complicated by CKD, hyperglycemia, prior medication nonadherence.  There were some difficulties with medication coverage previously.  Was started on Trulicity in October.  With teaching at office visit October 27.  Initially 0.75 mg weekly.  Readings in the high 100s to low 200s at his November 29 visit, dosage increased to 1.5 mg weekly.  He is on ARB with losartan and statin with simvastatin.  Continued on metformin 8107m BID, and glipizide 159mBID.  Microalbumin: Normal on 11/16/2022 Home readings: Fasting: 130 Randoms - up to 140. No 200's No sx lows.  Tolerating higher dose of trulicity, no new side effects.   Flu vaccine: declines.  Colonoscopy -due, Cologuard was ordered in 2021.  Not performed. Screening options with colonoscopy versus Cologuard discussed. Discussed timing of repeat testing intervals if normal, as well as potential need for diagnostic Colonoscopy if positive Cologuard. Understanding expressed, and chose Cologuard.    Lab Results  Component Value Date   HGBA1C 9.9 (A) 08/31/2022   HGBA1C 11.6 (H) 08/12/2021   HGBA1C 8.2 (H) 02/01/2021   Lab Results  Component Value Date   MICROALBUR 1.4 11/16/2022   LDLCALC 122 (H) 09/14/2022   CREATININE 1.40 11/16/2022   Hypokalemia See prior visits.  We discussed supplementation and clarified doses at his last visit.  20 mill equivalents daily at that time with normal range potassium. Still taking 2018mK per day. No new palpitations.   Lab Results  Component Value Date   K 3.5 11/16/2022   Some fatigue this am, better after drinking water, no fever, feeling ok now.   History Patient Active Problem List   Diagnosis Date Noted   Impingement syndrome of left  shoulder 07/30/2020   COVID-19 virus infection 10/31/2019   Abnormal liver function 10/31/2019   Hypokalemia 10/31/2019   Hyponatremia 10/31/2019   Diabetes mellitus ([place]/26/2018   Hyperlipidemia 11/13/2017   Hypertensive disorder 11/13/2017   Past Medical History:  Diagnosis Date   Diabetes mellitus without complication ([facility]  Hyperlipidemia    Hypertension    No past surgical history on file. No Known Allergies Prior to Admission medications   Medication Sig Start Date End Date Taking? Authorizing Provider  Accu-Chek FastClix Lancets MISC USE TO CHECK BLOOD SUGAR EVERY DAY 09/08/21  Yes [name]  blood glucose meter kit and supplies Check once per day. 09/15/22  Yes [name]  Dulaglutide (TRULICITY) 1.5 MG/GG/2.6RSPN Inject 1.5 mg into the skin once a week. 11/16/22  Yes [name]  glipiZIDE (GLUCOTROL) 10 MG tablet Take 1 tablet (10 mg total) by mouth 2 (two) times daily before a meal. 08/31/22  Yes [name]  glucose blood (ACCU-CHEK GUIDE) test strip USE TO CHECK BLOOD SUGAR twice per  DAY Dispense with meter of choice. 09/15/22  Yes [name]  Insulin Pen Needle (PEN NEEDLES) 32G X 4 MM MISC 1 application by Does not apply route daily. 08/27/21  Yes [name]  losartan (COZAAR) 25 MG tablet Take 1 tablet (25 mg total) by mouth daily. 10/14/22  Yes [name]  metFORMIN (GLUCOPHAGE) 850 MG tablet Take 1  tablet (850 mg total) by mouth 2 (two) times daily with a meal. 08/31/22  Yes [name], MD  NIFEdipine (PROCARDIA XL/NIFEDICAL XL) 60 MG 24 hr tablet Take 1 tablet (60 mg total) by mouth daily. 10/14/22  Yes [name], MD  potassium chloride (KLOR-CON) 10 MEQ tablet Take 2 tablets (20 mEq total) by mouth daily. 10/14/22  Yes [name], MD  simvastatin (ZOCOR) 80 MG tablet TAKE 1 TABLET(80 MG) BY MOUTH DAILY AT 6 PM 08/31/22  Yes [name], MD   Social History    Socioeconomic History   Marital status: Married    Spouse name: Not on file   Number of children: 2   Years of education: Not on file   Highest education level: Not on file  Occupational History   Occupation: environmental  Tobacco Use   Smoking status: Never   Smokeless tobacco: Never  Substance and Sexual Activity   Alcohol use: Never    Alcohol/week: 0.0 standard drinks of alcohol   Drug use: Never   Sexual activity: Yes  Other Topics Concern   Not on file  Social History Narrative   Not on file   Social Determinants of Health   Financial Resource Strain: Not on file  Food Insecurity: Not on file  Transportation Needs: Not on file  Physical Activity: Not on file  Stress: Not on file  Social Connections: Not on file  Intimate Partner Violence: Not on file    Review of Systems  Constitutional:  Negative for fatigue, fever and unexpected weight change.  Eyes:  Negative for visual disturbance.  Respiratory:  Negative for cough, chest tightness and shortness of breath.   Cardiovascular:  Negative for chest pain, palpitations and leg swelling.  Gastrointestinal:  Negative for abdominal pain and blood in stool.  Neurological:  Negative for dizziness, light-headedness and headaches.    Objective:   Vitals:   12/16/22 1126  BP: 136/78  Pulse: 73  Temp: 98.4 F (36.9 C)  SpO2: 97%  Weight: 227 lb 12.8 oz (103.3 kg)  Height: 6' (1.829 m)    Physical Exam Vitals reviewed.  Constitutional:      Appearance: He is well-developed.  HENT:     Head: Normocephalic and atraumatic.  Neck:     Vascular: No carotid bruit or JVD.  Cardiovascular:     Rate and Rhythm: Normal rate and regular rhythm.     Heart sounds: Normal heart sounds. No murmur heard. Pulmonary:     Effort: Pulmonary effort is normal.     Breath sounds: Normal breath sounds. No rales.  Musculoskeletal:     Right lower leg: No edema.     Left lower leg: No edema.  Skin:    General: Skin is warm  and dry.  Neurological:     Mental Status: He is alert and oriented to person, place, and time.  Psychiatric:        Mood and Affect: Mood normal.    Assessment & Plan:  Dylan Abbott is a 56 y.o. male . Type 2 diabetes mellitus with hyperglycemia, without long-term current use of insulin (HCC) - Plan: Hemoglobin V3Z, Basic metabolic panel, Hemoglobin A1c  -Tolerating current regimen, improving home readings.  Continue same dose Trulicity, glipizide, metformin other meds for now.  79-monthfollow-up.  Hypokalemia - Plan: Basic metabolic panel, Basic metabolic panel -Continue same dose potassium, check updated labs.  Screen for colon cancer - Plan: Cologuard Agrees to check Cologuard, ordered.  No orders of the defined types were placed in this encounter.  Patient Instructions  Glad to hear you are tolerating medicines including the higher dose of Trulicity.  Continue same doses for now as your home blood sugars sound better.  I will check the electrolytes and 45-monthblood sugar test today, recheck with me in 3 months unless any new symptoms or questions prior to that time.  I did order the Cologuard colon cancer screening test, let me know if you do not receive that to your home in the next 2 weeks.  Make sure to send that in as soon as you can.  Take care!    Signed,   JMerri Ray MD [facility] 12/16/22 12:12 PM

## 2022-12-16 NOTE — Patient Instructions (Signed)
Glad to hear you are tolerating medicines including the higher dose of Trulicity.  Continue same doses for now as your home blood sugars sound better.  I will check the electrolytes and 66-month blood sugar test today, recheck with me in 3 months unless any new symptoms or questions prior to that time.  I did order the Cologuard colon cancer screening test, let me know if you do not receive that to your home in the next 2 weeks.  Make sure to send that in as soon as you can.  Take care!

## 2022-12-20 ENCOUNTER — Telehealth: Payer: Self-pay

## 2022-12-20 ENCOUNTER — Other Ambulatory Visit: Payer: Self-pay | Admitting: Family Medicine

## 2022-12-20 DIAGNOSIS — E876 Hypokalemia: Secondary | ICD-10-CM

## 2022-12-20 NOTE — Progress Notes (Signed)
See lab note.  

## 2022-12-20 NOTE — Telephone Encounter (Signed)
Left Vm for pt to call office in regards to lab results  

## 2022-12-20 NOTE — Telephone Encounter (Signed)
-----   Message from [name], MD sent at 12/20/2022  3:07 PM EST ----- Call patient.  Diabetes test is improving but still somewhat elevated.  Potassium low again.  Increased to 40 mEq potassium per day with recheck levels in the next 3 days.

## 2022-12-21 NOTE — Telephone Encounter (Signed)
Spoke to the pt and advised of lab results . Pt is going to the [place] office to have labs drew .

## 2022-12-21 NOTE — Telephone Encounter (Signed)
Called and LM to call back

## 2023-01-22 LAB — COLOGUARD

## 2023-02-17 ENCOUNTER — Other Ambulatory Visit: Payer: Self-pay | Admitting: Family Medicine

## 2023-02-17 DIAGNOSIS — E785 Hyperlipidemia, unspecified: Secondary | ICD-10-CM

## 2023-03-06 ENCOUNTER — Other Ambulatory Visit: Payer: Self-pay | Admitting: Family Medicine

## 2023-03-06 DIAGNOSIS — E876 Hypokalemia: Secondary | ICD-10-CM

## 2023-03-17 ENCOUNTER — Ambulatory Visit: Payer: 59 | Admitting: Family Medicine

## 2023-05-10 ENCOUNTER — Other Ambulatory Visit: Payer: Self-pay | Admitting: Family Medicine

## 2023-05-10 DIAGNOSIS — I10 Essential (primary) hypertension: Secondary | ICD-10-CM

## 2023-05-10 DIAGNOSIS — N1832 Chronic kidney disease, stage 3b: Secondary | ICD-10-CM

## 2023-07-08 ENCOUNTER — Other Ambulatory Visit: Payer: Self-pay | Admitting: Family Medicine

## 2023-07-08 DIAGNOSIS — E876 Hypokalemia: Secondary | ICD-10-CM

## 2023-07-16 ENCOUNTER — Inpatient Hospital Stay (HOSPITAL_COMMUNITY)
Admission: EM | Admit: 2023-07-16 | Discharge: 2023-07-18 | DRG: 041 | Disposition: A | Discharge status: Home / Self Care | Payer: 59 | Attending: Internal Medicine | Admitting: Internal Medicine

## 2023-07-16 ENCOUNTER — Emergency Department (HOSPITAL_COMMUNITY): Payer: 59

## 2023-07-16 ENCOUNTER — Encounter (HOSPITAL_COMMUNITY): Payer: Self-pay

## 2023-07-16 ENCOUNTER — Other Ambulatory Visit: Payer: Self-pay

## 2023-07-16 DIAGNOSIS — I959 Hypotension, unspecified: Secondary | ICD-10-CM | POA: Diagnosis present

## 2023-07-16 DIAGNOSIS — Z7984 Long term (current) use of oral hypoglycemic drugs: Secondary | ICD-10-CM

## 2023-07-16 DIAGNOSIS — Z7985 Long-term (current) use of injectable non-insulin antidiabetic drugs: Secondary | ICD-10-CM | POA: Diagnosis not present

## 2023-07-16 DIAGNOSIS — Z6835 Body mass index (BMI) 35.0-35.9, adult: Secondary | ICD-10-CM

## 2023-07-16 DIAGNOSIS — G459 Transient cerebral ischemic attack, unspecified: Principal | ICD-10-CM | POA: Diagnosis present

## 2023-07-16 DIAGNOSIS — I10 Essential (primary) hypertension: Secondary | ICD-10-CM | POA: Diagnosis present

## 2023-07-16 DIAGNOSIS — I214 Non-ST elevation (NSTEMI) myocardial infarction: Secondary | ICD-10-CM | POA: Diagnosis present

## 2023-07-16 DIAGNOSIS — I161 Hypertensive emergency: Secondary | ICD-10-CM | POA: Diagnosis present

## 2023-07-16 DIAGNOSIS — R7989 Other specified abnormal findings of blood chemistry: Secondary | ICD-10-CM

## 2023-07-16 DIAGNOSIS — I2489 Other forms of acute ischemic heart disease: Secondary | ICD-10-CM | POA: Diagnosis present

## 2023-07-16 DIAGNOSIS — E876 Hypokalemia: Secondary | ICD-10-CM | POA: Diagnosis present

## 2023-07-16 DIAGNOSIS — Z79899 Other long term (current) drug therapy: Secondary | ICD-10-CM

## 2023-07-16 DIAGNOSIS — Z794 Long term (current) use of insulin: Secondary | ICD-10-CM

## 2023-07-16 DIAGNOSIS — E785 Hyperlipidemia, unspecified: Secondary | ICD-10-CM | POA: Diagnosis present

## 2023-07-16 DIAGNOSIS — E1165 Type 2 diabetes mellitus with hyperglycemia: Secondary | ICD-10-CM | POA: Diagnosis present

## 2023-07-16 DIAGNOSIS — N179 Acute kidney failure, unspecified: Secondary | ICD-10-CM | POA: Diagnosis present

## 2023-07-16 DIAGNOSIS — R079 Chest pain, unspecified: Principal | ICD-10-CM

## 2023-07-16 DIAGNOSIS — R479 Unspecified speech disturbances: Secondary | ICD-10-CM

## 2023-07-16 DIAGNOSIS — I639 Cerebral infarction, unspecified: Secondary | ICD-10-CM | POA: Diagnosis not present

## 2023-07-16 LAB — HEPATIC FUNCTION PANEL
ALT: 20 U/L (ref 0–44)
AST: 24 U/L (ref 15–41)
Albumin: 3.8 g/dL (ref 3.5–5.0)
Alkaline Phosphatase: 65 U/L (ref 38–126)
Bilirubin, Direct: 0.1 mg/dL (ref 0.0–0.2)
Indirect Bilirubin: 0.6 mg/dL (ref 0.3–0.9)
Total Bilirubin: 0.7 mg/dL (ref 0.3–1.2)
Total Protein: 7.2 g/dL (ref 6.5–8.1)

## 2023-07-16 LAB — BASIC METABOLIC PANEL
Anion gap: 12 (ref 5–15)
Anion gap: 9 (ref 5–15)
BUN: 19 mg/dL (ref 6–20)
BUN: 20 mg/dL (ref 6–20)
CO2: 25 mmol/L (ref 22–32)
CO2: 27 mmol/L (ref 22–32)
Calcium: 9.3 mg/dL (ref 8.9–10.3)
Calcium: 9 mg/dL (ref 8.9–10.3)
Chloride: 93 mmol/L — ABNORMAL LOW (ref 98–111)
Chloride: 94 mmol/L — ABNORMAL LOW (ref 98–111)
Creatinine, Ser: 1.48 mg/dL — ABNORMAL HIGH (ref 0.61–1.24)
Creatinine, Ser: 1.28 mg/dL — ABNORMAL HIGH (ref 0.61–1.24)
GFR, Estimated: 55 mL/min — ABNORMAL LOW (ref >60)
GFR, Estimated: >60 mL/min (ref >60)
Glucose, Bld: 527 mg/dL (ref 70–99)
Glucose, Bld: 416 mg/dL — ABNORMAL HIGH (ref 70–99)
Potassium: 3.3 mmol/L — ABNORMAL LOW (ref 3.5–5.1)
Potassium: 3.2 mmol/L — ABNORMAL LOW (ref 3.5–5.1)
Sodium: 130 mmol/L — ABNORMAL LOW (ref 135–145)
Sodium: 130 mmol/L — ABNORMAL LOW (ref 135–145)

## 2023-07-16 LAB — CBC
HCT: 40.4 % (ref 39.0–52.0)
Hemoglobin: 14.8 g/dL (ref 13.0–17.0)
MCH: 30.5 pg (ref 26.0–34.0)
MCHC: 36.6 g/dL — ABNORMAL HIGH (ref 30.0–36.0)
MCV: 83.1 fL (ref 80.0–100.0)
Platelets: 168 10*3/uL (ref 150–400)
RBC: 4.86 MIL/uL (ref 4.22–5.81)
RDW: 13.2 % (ref 11.5–15.5)
WBC: 6.6 10*3/uL (ref 4.0–10.5)
nRBC: 0 % (ref 0.0–0.2)

## 2023-07-16 LAB — URINALYSIS, ROUTINE W REFLEX MICROSCOPIC
Bacteria, UA: NONE SEEN
Bilirubin Urine: NEGATIVE
Glucose, UA: >=500 mg/dL — AB
Hgb urine dipstick: NEGATIVE
Ketones, ur: NEGATIVE mg/dL
Leukocytes,Ua: NEGATIVE
Nitrite: NEGATIVE
Protein, ur: NEGATIVE mg/dL
Specific Gravity, Urine: 1.043 — ABNORMAL HIGH (ref 1.005–1.030)
pH: 7 (ref 5.0–8.0)

## 2023-07-16 LAB — CBG MONITORING, ED: Glucose-Capillary: 389 mg/dL — ABNORMAL HIGH (ref 70–99)

## 2023-07-16 LAB — PROTIME-INR
INR: 1 (ref 0.8–1.2)
Prothrombin Time: 13.1 seconds (ref 11.4–15.2)

## 2023-07-16 LAB — TROPONIN I (HIGH SENSITIVITY)
Troponin I (High Sensitivity): 78 ng/L — ABNORMAL HIGH (ref <18)
Troponin I (High Sensitivity): 65 ng/L — ABNORMAL HIGH (ref <18)

## 2023-07-16 LAB — LIPASE, BLOOD: Lipase: 70 U/L — ABNORMAL HIGH (ref 11–51)

## 2023-07-16 MED ORDER — ONDANSETRON HCL 4 MG PO TABS: 4.0000 mg | ORAL_TABLET | Freq: Four times a day (QID) | ORAL | Status: DC | PRN

## 2023-07-16 MED ORDER — IOHEXOL 350 MG/ML SOLN
80.0000 mL | Freq: Once | INTRAVENOUS | Status: AC | PRN
  Administered 2023-07-16: 80 mL via INTRAVENOUS

## 2023-07-16 MED ORDER — ENOXAPARIN SODIUM 40 MG/0.4ML IJ SOSY
40.0000 mg | PREFILLED_SYRINGE | INTRAMUSCULAR | Status: DC
  Administered 2023-07-17 – 2023-07-18 (×2): 40 mg via SUBCUTANEOUS
  Filled 2023-07-16 (×2): qty 0.4

## 2023-07-16 MED ORDER — LABETALOL HCL 5 MG/ML IV SOLN
10.0000 mg | Freq: Once | INTRAVENOUS | Status: AC
  Administered 2023-07-16: 10 mg via INTRAVENOUS
  Filled 2023-07-16: qty 4

## 2023-07-16 MED ORDER — ONDANSETRON HCL 4 MG/2ML IJ SOLN: 4.0000 mg | Freq: Four times a day (QID) | INTRAMUSCULAR | Status: DC | PRN

## 2023-07-16 MED ORDER — DEXTROSE 50 % IV SOLN: 0.0000 mL | INTRAVENOUS | Status: DC | PRN

## 2023-07-16 MED ORDER — ATORVASTATIN CALCIUM 40 MG PO TABS: 40.0000 mg | ORAL_TABLET | Freq: Every day | ORAL | Status: DC

## 2023-07-16 MED ORDER — LABETALOL HCL 5 MG/ML IV SOLN
20.0000 mg | INTRAVENOUS | Status: DC | PRN
  Administered 2023-07-17 – 2023-07-18 (×2): 20 mg via INTRAVENOUS
  Filled 2023-07-16 (×2): qty 4

## 2023-07-16 MED ORDER — OXYCODONE HCL 5 MG PO TABS: 5.0000 mg | ORAL_TABLET | ORAL | Status: DC | PRN

## 2023-07-16 MED ORDER — INSULIN DETEMIR 100 UNIT/ML ~~LOC~~ SOLN
20.0000 [IU] | Freq: Every day | SUBCUTANEOUS | Status: DC
  Administered 2023-07-17 – 2023-07-18 (×3): 20 [IU] via SUBCUTANEOUS
  Filled 2023-07-16 (×3): qty 0.2

## 2023-07-16 MED ORDER — ASPIRIN 81 MG PO CHEW
324.0000 mg | CHEWABLE_TABLET | Freq: Once | ORAL | Status: AC
  Administered 2023-07-16: 324 mg via ORAL
  Filled 2023-07-16: qty 4

## 2023-07-16 MED ORDER — HYDRALAZINE HCL 20 MG/ML IJ SOLN
10.0000 mg | Freq: Four times a day (QID) | INTRAMUSCULAR | Status: DC | PRN
  Administered 2023-07-17: 10 mg via INTRAVENOUS
  Filled 2023-07-16 (×3): qty 1

## 2023-07-16 MED ORDER — SODIUM CHLORIDE (PF) 0.9 % IJ SOLN
INTRAMUSCULAR | Status: AC
  Filled 2023-07-16: qty 50

## 2023-07-16 MED ORDER — INSULIN REGULAR(HUMAN) IN NACL 100-0.9 UT/100ML-% IV SOLN: INTRAVENOUS | Status: DC

## 2023-07-16 MED ORDER — ACETAMINOPHEN 325 MG PO TABS: 650.0000 mg | ORAL_TABLET | Freq: Four times a day (QID) | ORAL | Status: DC | PRN

## 2023-07-16 MED ORDER — ASPIRIN 81 MG PO TBEC
81.0000 mg | DELAYED_RELEASE_TABLET | Freq: Every day | ORAL | Status: DC
  Administered 2023-07-17 – 2023-07-18 (×2): 81 mg via ORAL
  Filled 2023-07-16 (×2): qty 1

## 2023-07-16 MED ORDER — DEXTROSE IN LACTATED RINGERS 5 % IV SOLN: INTRAVENOUS | Status: DC

## 2023-07-16 MED ORDER — LACTATED RINGERS IV SOLN: INTRAVENOUS | Status: DC

## 2023-07-16 MED ORDER — LOSARTAN POTASSIUM 25 MG PO TABS: 25.0000 mg | ORAL_TABLET | Freq: Every day | ORAL | Status: DC

## 2023-07-16 MED ORDER — INSULIN ASPART 100 UNIT/ML IJ SOLN
0.0000 [IU] | INTRAMUSCULAR | Status: DC
  Administered 2023-07-17 (×4): 8 [IU] via SUBCUTANEOUS
  Administered 2023-07-17: 12 [IU] via SUBCUTANEOUS
  Administered 2023-07-18 (×2): 2 [IU] via SUBCUTANEOUS
  Administered 2023-07-18: 16 [IU] via SUBCUTANEOUS
  Administered 2023-07-18: 4 [IU] via SUBCUTANEOUS
  Filled 2023-07-16: qty 0.24

## 2023-07-16 MED ORDER — ACETAMINOPHEN 650 MG RE SUPP: 650.0000 mg | Freq: Four times a day (QID) | RECTAL | Status: DC | PRN

## 2023-07-16 MED ORDER — SENNOSIDES-DOCUSATE SODIUM 8.6-50 MG PO TABS: 1.0000 | ORAL_TABLET | Freq: Every evening | ORAL | Status: DC | PRN

## 2023-07-16 NOTE — ED Notes (Signed)
Save blue in main lab 

## 2023-07-16 NOTE — Consult Note (Incomplete)
To be seen on arrival to [facility] for possible TIA  Neurology Consultation Reason for Consult: *** Requesting Physician: ***  CC: ***  History is obtained from:***  HPI: Dylan Abbott is a 57 y.o. male ***   LKW: *** Thrombolytic given?: No, or if yes, time given ***  Checklist of contraindications was reviewed and negative. Risks, benefits and alternatives were discussed *** IA performed?: No, or if yes, groin puncture time: *** Premorbid modified rankin scale: ***     0 - No symptoms.     1 - No significant disability. Able to carry out all usual activities, despite some symptoms.     2 - Slight disability. Able to look after own affairs without assistance, but unable to carry out all previous activities.     3 - Moderate disability. Requires some help, but able to walk unassisted.     4 - Moderately severe disability. Unable to attend to own bodily needs without assistance, and unable to walk unassisted.     5 - Severe disability. Requires constant nursing care and attention, bedridden, incontinent.     6 - Dead. ICH Score: ***  Time performed: *** GCS: {Blank single:19197::"3-4 is 2 points","5-12 is 1 point","13-15 is 0 points"} Infratentorial: {yes no:314532}. If yes, 1 point Volume: {Blank single:19197::">30cc is 1 point","<30cc is 0 points"}  Age: 57 y.o.. >80 is 1 point Intraventricular extension is 1 point  Score:***  A Score of {Blank single:19197::"0 points has a 30 day mortality of 0%","1 points has a 30 day mortality of 13%","2 points has a 30 day mortality of 26%","3 points has a 30 day mortality of 72%","4 points has a 30 day mortality of 97%","5-6 points has a 30 day mortality of 100%"}. Stroke. 2001 Apr;32(4):891-7.    ROS: All other review of systems was negative except as noted in the HPI. *** Unable to obtain due to altered mental status.   Past Medical History:  Diagnosis Date  . Diabetes mellitus without complication (HCC)   . Hyperlipidemia   .  Hypertension    ***  History reviewed. No pertinent family history. ***  Social History:  reports that he has never smoked. He has never used smokeless tobacco. He reports that he does not drink alcohol and does not use drugs. ***  Exam: Current vital signs: BP (!) 170/95   Pulse 78   Temp 98.4 F (36.9 C) (Oral)   Resp 14   Ht 6' (1.829 m)   Wt 104.3 kg   SpO2 94%   BMI 31.19 kg/m  Vital signs in last 24 hours: Temp:  [98.4 F (36.9 C)] 98.4 F (36.9 C) (07/28 2009) Pulse Rate:  [78-95] 78 (07/28 2145) Resp:  [14-18] 14 (07/28 2145) BP: (170-203)/(95-97) 170/95 (07/28 2145) SpO2:  [94 %-100 %] 94 % (07/28 2145) Weight:  [104.3 kg] 104.3 kg (07/28 2226)   Physical Exam  Constitutional: Appears well-developed and well-nourished.  Psych: Affect appropriate to situation, *** Eyes: No scleral injection HENT: No oropharyngeal obstruction.  MSK: no joint deformities.  Cardiovascular: Normal rate and regular rhythm. *** Perfusing extremities well Respiratory: Effort normal, non-labored breathing GI: Soft.  No distension. There is no tenderness.  Skin: Warm dry and intact visible skin  Neuro: Mental Status: Patient is awake, alert, oriented to person, place, month, year, and situation.*** Patient is able to give a clear and coherent history.*** No signs of aphasia or neglect*** Cranial Nerves: II: Visual Fields are full. Pupils are equal, round, and reactive to  light.  *** III,IV, VI: EOMI without ptosis or diploplia.  V: Facial sensation is symmetric to temperature VII: Facial movement is symmetric.  VIII: hearing is intact to voice X: Uvula elevates symmetrically XI: Shoulder shrug is symmetric. XII: tongue is midline without atrophy or fasciculations.  Motor: Tone is normal. Bulk is normal. 5/5 strength was present in all four extremities. *** Sensory: Sensation is symmetric to light touch and temperature in the arms and legs.*** Deep Tendon Reflexes: 2+ and  symmetric in the brachioradialis and patellae. *** Plantars: Toes are downgoing bilaterally. *** Cerebellar: FNF and HKS are intact bilaterally*** Gait:  Deferred in acute setting ***  NIHSS total *** Score breakdown: *** Performed at *** time of patient arrival to ED    I have reviewed labs in epic and the results pertinent to this consultation are:  Basic Metabolic Panel: Recent Labs  Lab 07/16/23 2020  NA 130*  K 3.3*  CL 93*  CO2 25  GLUCOSE 527*  BUN 19  CREATININE 1.48*  CALCIUM 9.3    CBC: Recent Labs  Lab 07/16/23 2020  WBC 6.6  HGB 14.8  HCT 40.4  MCV 83.1  PLT 168    Coagulation Studies: Recent Labs    07/16/23 2020  LABPROT 13.1  INR 1.0      I have reviewed the images obtained:  Head CT no acute intracranial process  Left tentorium calcification vs. meningioma   Impression: ***  Recommendations: - ***   Brooke Dare MD-PhD [facility] [phone]   *** [facility], [facility], Teleneuro    Total critical care time: *** minutes   Critical care time was exclusive of separately billable procedures and treating other patients.   Critical care was necessary to treat or prevent imminent or life-threatening deterioration.   Critical care was time spent personally by me on the following activities: development of treatment plan with patient and/or surrogate as well as nursing, discussions with consultants/primary team, evaluation of patient's response to treatment, examination of patient, obtaining history from patient or surrogate, ordering and performing treatments and interventions, ordering and review of laboratory studies, ordering and review of radiographic studies, and re-evaluation of patient's condition as needed, as documented above.

## 2023-07-16 NOTE — Consult Note (Signed)
Neurology Consultation Reason for Consult: Transient loss of ability to speak Requesting Physician: Theda Belfast  CC: Transient loss of ability to speak, chest pain  History is obtained from: Patient and chart review   HPI: Dylan Abbott is a 57 y.o. male with a past medical history significant for hypertension, hyperlipidemia, type II diabetes (A1c 8.1% 12/16/2022), BMI 31.19, CKD  He reports that at 2 PM today began to have some chest pain and felt like his heart was racing at the time.  This has resolved but at 7 PM he was speaking with someone and was unable to answer their questions stuttering monosyllabic nonsense in response.  This lasted about 5 minutes but then recurred again at 8 PM which prompted him to come to the ED for further evaluation.  He has no other focal neurological deficits in recent weeks other than some left hand tingling in the setting of lifting something at work that resolved with setting the object down.  He sees PCP regularly and his diabetes regimen was increased in response to his last A1c.  However he reports he does not regularly check his sugar at home and he does not regularly check his blood pressure at home  He was very hypertensive on arrival to the ED and did receive a dose of labetalol due to the chest pain with improvement in his blood pressure  Blood pressure at his last PCP visits 12/16/2022 136/78 weight 227 pounds;  11/16/2022 128/60, Weight 233 pounds Weight in the ED today 230 pounds  ROS: All other review of systems was negative except as noted in the HPI.   Past Medical History:  Diagnosis Date   Diabetes mellitus without complication (HCC)    Hyperlipidemia    Hypertension    History reviewed. No pertinent family history.  Social History:  reports that he has never smoked. He has never used smokeless tobacco. He reports that he does not drink alcohol and does not use drugs.  Exam: Current vital signs: BP (!) 170/95   Pulse 78    Temp 98.4 F (36.9 C) (Oral)   Resp 14   Ht 6' (1.829 m)   Wt 104.3 kg   SpO2 94%   BMI 31.19 kg/m  Vital signs in last 24 hours: Temp:  [98.4 F (36.9 C)] 98.4 F (36.9 C) (07/28 2009) Pulse Rate:  [78-95] 78 (07/28 2145) Resp:  [14-18] 14 (07/28 2145) BP: (170-203)/(95-97) 170/95 (07/28 2145) SpO2:  [94 %-100 %] 94 % (07/28 2145) Weight:  [104.3 kg] 104.3 kg (07/28 2226)   Physical Exam  Constitutional: Appears well-developed and well-nourished.  Psych: Affect pleasant and cooperative Eyes: No scleral injection HENT: No oropharyngeal obstruction.  MSK: no joint deformities.  Cardiovascular: Normal rate and regular rhythm. Perfusing extremities well Respiratory: Effort normal, non-labored breathing GI: Soft.  No distension. There is no tenderness.  Skin: Warm dry and intact visible skin  Neuro: Mental Status: Patient is awake, alert, oriented to person, place, month, year, and situation. Patient is able to give a clear and coherent history. No signs of aphasia or neglect within limits of English as a second language Cranial Nerves: II: Visual Fields are full. Pupils are equal, round, and reactive to light.   III,IV, VI: EOMI without ptosis or diploplia.  Mildly saccadic pursuits V: Facial sensation is symmetric to temperature VII: Facial movement is symmetric.  VIII: hearing is intact to voice X: Uvula elevates symmetrically XI: Shoulder shrug is symmetric. XII: tongue is midline without atrophy  or fasciculations.  Motor: Tone is normal. Bulk is normal. 5/5 strength was present in all four extremities.  Sensory: Sensation is symmetric to light touch and temperature in the arms and legs. Deep Tendon Reflexes: 2+ and symmetric in the brachioradialis and patellae.  Cerebellar: FNF and HKS are intact bilaterally Gait:  Deferred as patient is on an insulin drip  NIHSS total 0 Performed at 4:00 AM   I have reviewed labs in epic and the results pertinent to this  consultation are:  Basic Metabolic Panel: Recent Labs  Lab 07/16/23 2020  NA 130*  K 3.3*  CL 93*  CO2 25  GLUCOSE 527*  BUN 19  CREATININE 1.48*  CALCIUM 9.3    CBC: Recent Labs  Lab 07/16/23 2020  WBC 6.6  HGB 14.8  HCT 40.4  MCV 83.1  PLT 168    Coagulation Studies: Recent Labs    07/16/23 2020  LABPROT 13.1  INR 1.0    Trop 78 -> 65  Lab Results  Component Value Date   CHOL 196 09/14/2022   HDL 57.70 09/14/2022   LDLCALC 122 (H) 09/14/2022   TRIG 84.0 09/14/2022   CHOLHDL 3 09/14/2022   Lab Results  Component Value Date   HGBA1C 8.1 (H) 12/16/2022     I have reviewed the images obtained:  Head CT no acute intracranial process; left tentorium calcification vs. meningioma  CTA head and neck personally reviewed, agree with radiology: Normal CTA of the head and neck.   Impression: This is a 57 year old gentleman with past medical history significant for hypertension, hyperlipidemia, diabetes, obesity, presenting with transient aphasia.  The etiology may be secondary to his significant hyperglycemia as that can lead to focal neurologic symptoms however given his stroke risk factors I do think admission for full TIA workup is prudent.   Recommendations: # Stroke versus TIA versus hypertensive encephalopathy versus toxic/metabolic encephalopathy secondary to hyperglycemia - Stroke labs HgbA1c, fasting lipid panel - Simvastatin 80 mg changed to atorvastatin 40 mg dose nightly; if LDL is already less than 70 may continue his home simvastatin - Goal A1c less than 7% - MRI brain - Frequent neuro checks - Echocardiogram - Agree with aspirin already given for concern for NSTEMI - Please give Plavix 300 mg loading dose followed by 75 mg daily if patient's troponins remained stable and he does not require initiation of heparin drip for ACS protocol and if there are no other medical contraindications - Risk factor modification - Telemetry monitoring - Blood  pressure goal   - Permissive hypertension to 220/120 due to potential for acute stroke unless MRI results negative then may start to gradually normalize BP (or as cardiac status allows) - PT consult, OT consult, Speech consult not indicated as patient is at baseline at this time - Stroke team to follow   Brooke Dare MD-PhD [facility] [phone] Available 7 AM to 7 PM, outside these hours please contact Neurologist on call listed on AMION

## 2023-07-16 NOTE — ED Triage Notes (Signed)
Pt. Arrives POV c/o chest pain since about 2pm. Pt. Endorses SOB when talking. States that the pain does not radiate anywhere. Denies N /V

## 2023-07-16 NOTE — H&P (Incomplete)
History and Physical    Dylan Abbott WNU:[id number] DOB: September 07, 1966 DOA: 07/16/2023  PCP: Shade Flood, MD   Chief Complaint: cp  HPI: Dylan Abbott is a 57 y.o. male with medical history significant of hypertension, hyperlipidemia who presents emergency department with chest pain.  Patient's been having several hours of substernal chest pressure.  He presented to the emergency department where he was found to be profoundly hypertensive with systolics in the 200s.  Labs were obtained which revealed glucose 527, sodium 130, potassium 3.3, creatinine 1.4, WBC 6.6, hemoglobin 14.8, troponin 78, INR 1.0.  Patient had CT angio head and neck which showed no abnormalities.  Chest x-ray showed no active disease.  Patient was admitted due to elevated troponin and need for neurologic evaluation to [facility].  Emergency department provider spoke with cardiology who recommended holding off initiation of heparin drip.   Review of Systems: Review of Systems  Constitutional: Negative.   All other systems reviewed and are negative.    As per HPI otherwise 10 point review of systems negative.   No Known Allergies  Past Medical History:  Diagnosis Date   Diabetes mellitus without complication (HCC)    Hyperlipidemia    Hypertension     History reviewed. No pertinent surgical history.   reports that he has never smoked. He has never used smokeless tobacco. He reports that he does not drink alcohol and does not use drugs.  History reviewed. No pertinent family history.  Prior to Admission medications   Medication Sig Start Date End Date Taking? Authorizing Provider  glipiZIDE (GLUCOTROL) 10 MG tablet Take 1 tablet (10 mg total) by mouth 2 (two) times daily before a meal. 12/16/22  Yes Shade Flood, MD  losartan (COZAAR) 25 MG tablet TAKE 1 TABLET(25 MG) BY MOUTH DAILY Patient taking differently: Take 25 mg by mouth daily. 05/10/23  Yes Shade Flood, MD  metFORMIN (GLUCOPHAGE)  850 MG tablet Take 1 tablet (850 mg total) by mouth 2 (two) times daily with a meal. 12/16/22  Yes Shade Flood, MD  potassium chloride (KLOR-CON) 10 MEQ tablet TAKE 2 TABLETS(20 MEQ) BY MOUTH DAILY Patient taking differently: Take 10 mEq by mouth 2 (two) times daily. 07/10/23  Yes Shade Flood, MD  simvastatin (ZOCOR) 80 MG tablet TAKE 1 TABLET(80 MG) BY MOUTH DAILY AT 6 PM Patient taking differently: Take 80 mg by mouth daily at 6 PM. 02/17/23  Yes Shade Flood, MD  Accu-Chek FastClix Lancets MISC USE TO [facility] BLOOD SUGAR EVERY DAY 09/08/21   Shade Flood, MD  blood glucose meter kit and supplies Check once per day. 09/15/22   Shade Flood, MD  Dulaglutide (TRULICITY) 1.5 MG/0.5ML SOPN Inject 1.5 mg into the skin once a week. Patient not taking: Reported on 07/16/2023 12/16/22   Shade Flood, MD  glucose blood (ACCU-CHEK GUIDE) test strip USE TO CHECK BLOOD SUGAR twice per  DAY Dispense with meter of choice. 09/15/22   Shade Flood, MD  Insulin Pen Needle (PEN NEEDLES) 32G X 4 MM MISC 1 application by Does not apply route daily. 08/27/21   Shade Flood, MD  NIFEdipine (PROCARDIA XL/NIFEDICAL XL) 60 MG 24 hr tablet Take 1 tablet (60 mg total) by mouth daily. Patient not taking: Reported on 07/16/2023 10/14/22   Shade Flood, MD    Physical Exam: Vitals:   07/16/23 2009 07/16/23 2145 07/16/23 2226  BP: (!) 203/97 (!) 170/95   Pulse: 95 78  Resp: 18 14   Temp: 98.4 F (36.9 C)    TempSrc: Oral    SpO2: 100% 94%   Weight:   104.3 kg  Height:   6' (1.829 m)   Physical Exam Vitals reviewed.  Constitutional:      Appearance: He is normal weight.  Cardiovascular:     Rate and Rhythm: Normal rate and regular rhythm.  Pulmonary:     Effort: Pulmonary effort is normal.     Breath sounds: Normal breath sounds.  Abdominal:     General: Bowel sounds are normal.     Palpations: Abdomen is soft.  Musculoskeletal:        General: Normal range of motion.      Cervical back: Normal range of motion.  Skin:    General: Skin is warm.  Neurological:     General: No focal deficit present.     Mental Status: He is alert.  Psychiatric:        Mood and Affect: Mood normal.        Labs on Admission: I have personally reviewed the patients's labs and imaging studies.  Assessment/Plan Principal Problem:   NSTEMI (non-ST elevated myocardial infarction) (HCC)   # Hypertensive emergency-patient has elevated troponin in setting of blood pressures over 200.  Received IV labetalol with improvement.  Will place on as needed labetalol and hydralazine.  Will hold losartan due to AKI.  # NSTEMI-patient's troponin elevated to 78.  Will continue to trend and consult cardiology at [facility].  Echocardiogram is ordered.  Continue aspirin  # Hyperlipidemia-continue Lipitor  # Hyperglycemia-DC insulin drip. Levelmir 20u, q4h SSI  # Speech changes, left-sided weakness-neurologic exam on my evaluation was nonfocal.  Will order MRI to further assess and neurology consulted at [facility].   Admission status: Inpatient Telemetry Cardiac  Certification: The appropriate patient status for this patient is INPATIENT. Inpatient status is judged to be reasonable and necessary in order to provide the required intensity of service to ensure the patient's safety. The patient's presenting symptoms, physical exam findings, and initial radiographic and laboratory data in the context of their chronic comorbidities is felt to place them at high risk for further clinical deterioration. Furthermore, it is not anticipated that the patient will be medically stable for discharge from the hospital within 2 midnights of admission.   * I certify that at the point of admission it is my clinical judgment that the patient will require inpatient hospital care spanning beyond 2 midnights from the point of admission due to high intensity of service, high risk for further deterioration and  high frequency of surveillance required.Alan Mulder MD [facility] If 7PM-7AM, please contact night-coverage [email or web address]  07/16/2023, 11:05 PM

## 2023-07-16 NOTE — ED Provider Notes (Signed)
[facility] Provider Note   CSN: [id number] Arrival date & time: 07/16/23  1958     History  Chief Complaint  Patient presents with   Chest Pain    Kaman Fronda is a 57 y.o. male.  The history is provided by the patient and medical records. No language interpreter was used.  Chest Pain Pain location:  Substernal area Pain quality: aching, pressure and tightness   Pain radiates to:  Does not radiate Pain severity:  Severe Onset quality:  Gradual Duration:  7 hours Timing:  Constant Progression:  Waxing and waning Chronicity:  New Context: not trauma   Relieved by:  Nothing Worsened by:  Nothing Ineffective treatments:  None tried Associated symptoms: shortness of breath   Associated symptoms: no abdominal pain, no back pain, no cough, no dizziness, no fatigue, no fever, no headache, no nausea, no numbness, no palpitations, no vomiting and no weakness        Home Medications Prior to Admission medications   Medication Sig Start Date End Date Taking? Authorizing Provider  Accu-Chek FastClix Lancets MISC USE TO CHECK BLOOD SUGAR EVERY DAY 09/08/21   Shade Flood, MD  blood glucose meter kit and supplies Check once per day. 09/15/22   Shade Flood, MD  Dulaglutide (TRULICITY) 1.5 MG/0.5ML SOPN Inject 1.5 mg into the skin once a week. 12/16/22   Shade Flood, MD  glipiZIDE (GLUCOTROL) 10 MG tablet Take 1 tablet (10 mg total) by mouth 2 (two) times daily before a meal. 12/16/22   Shade Flood, MD  glucose blood (ACCU-CHEK GUIDE) test strip USE TO CHECK BLOOD SUGAR twice per  DAY Dispense with meter of choice. 09/15/22   Shade Flood, MD  Insulin Pen Needle (PEN NEEDLES) 32G X 4 MM MISC 1 application by Does not apply route daily. 08/27/21   Shade Flood, MD  losartan (COZAAR) 25 MG tablet TAKE 1 TABLET(25 MG) BY MOUTH DAILY 05/10/23   Shade Flood, MD  metFORMIN (GLUCOPHAGE) 850 MG tablet Take 1  tablet (850 mg total) by mouth 2 (two) times daily with a meal. 12/16/22   Shade Flood, MD  NIFEdipine (PROCARDIA XL/NIFEDICAL XL) 60 MG 24 hr tablet Take 1 tablet (60 mg total) by mouth daily. 10/14/22   Shade Flood, MD  potassium chloride (KLOR-CON) 10 MEQ tablet TAKE 2 TABLETS(20 MEQ) BY MOUTH DAILY 07/10/23   Shade Flood, MD  simvastatin (ZOCOR) 80 MG tablet TAKE 1 TABLET(80 MG) BY MOUTH DAILY AT 6 PM 02/17/23   Shade Flood, MD      Allergies    Patient has no known allergies.    Review of Systems   Review of Systems  Constitutional:  Negative for chills, fatigue and fever.  HENT:  Negative for congestion.   Eyes:  Negative for visual disturbance.  Respiratory:  Positive for shortness of breath. Negative for cough, chest tightness and wheezing.   Cardiovascular:  Positive for chest pain. Negative for palpitations.  Gastrointestinal:  Negative for abdominal pain, constipation, diarrhea, nausea and vomiting.  Genitourinary:  Negative for dysuria, flank pain and frequency.  Musculoskeletal:  Negative for back pain, neck pain and neck stiffness.  Skin:  Negative for rash and wound.  Neurological:  Positive for speech difficulty. Negative for dizziness, syncope, weakness, light-headedness, numbness and headaches.  Psychiatric/Behavioral:  Negative for agitation.   All other systems reviewed and are negative.   Physical Exam Updated Vital  Signs BP (!) 203/97 (BP Location: Right Arm)   Pulse 95   Temp 98.4 F (36.9 C) (Oral)   Resp 18   SpO2 100%  Physical Exam Vitals and nursing note reviewed.  Constitutional:      General: He is not in acute distress.    Appearance: He is well-developed. He is not ill-appearing, toxic-appearing or diaphoretic.  HENT:     Head: Normocephalic and atraumatic.     Nose: Nose normal.     Mouth/Throat:     Mouth: Mucous membranes are moist.  Eyes:     Conjunctiva/sclera: Conjunctivae normal.     Pupils: Pupils are equal,  round, and reactive to light.  Cardiovascular:     Rate and Rhythm: Normal rate and regular rhythm.     Heart sounds: Normal heart sounds. No murmur heard. Pulmonary:     Effort: Pulmonary effort is normal. No respiratory distress.     Breath sounds: Normal breath sounds. No wheezing, rhonchi or rales.  Chest:     Chest wall: No tenderness.  Abdominal:     Palpations: Abdomen is soft.     Tenderness: There is no abdominal tenderness. There is no guarding or rebound.  Musculoskeletal:        General: No swelling or tenderness.     Cervical back: Neck supple.  Skin:    General: Skin is warm and dry.     Capillary Refill: Capillary refill takes less than 2 seconds.     Findings: No erythema.  Neurological:     General: No focal deficit present.     Mental Status: He is alert.     Sensory: No sensory deficit.     Motor: No weakness.  Psychiatric:        Mood and Affect: Mood normal.     ED Results / Procedures / Treatments   Labs (all labs ordered are listed, but only abnormal results are displayed) Labs Reviewed  BASIC METABOLIC PANEL - Abnormal; Notable for the following components:      Result Value   Sodium 130 (*)    Potassium 3.3 (*)    Chloride 93 (*)    Glucose, Bld 527 (*)    Creatinine, Ser 1.48 (*)    GFR, Estimated 55 (*)    All other components within normal limits  CBC - Abnormal; Notable for the following components:   MCHC 36.6 (*)    All other components within normal limits  LIPASE, BLOOD - Abnormal; Notable for the following components:   Lipase 70 (*)    All other components within normal limits  CBG MONITORING, ED - Abnormal; Notable for the following components:   Glucose-Capillary 389 (*)    All other components within normal limits  TROPONIN I (HIGH SENSITIVITY) - Abnormal; Notable for the following components:   Troponin I (High Sensitivity) 78 (*)    All other components within normal limits  PROTIME-INR  HEPATIC FUNCTION PANEL  TSH   BASIC METABOLIC PANEL  CBC WITH DIFFERENTIAL/PLATELET  URINALYSIS, ROUTINE W REFLEX MICROSCOPIC  CBC WITH DIFFERENTIAL/PLATELET  HIV ANTIBODY (ROUTINE TESTING W REFLEX)  CBC  CREATININE, SERUM  BASIC METABOLIC PANEL  CBC  TROPONIN I (HIGH SENSITIVITY)    EKG EKG Interpretation Date/Time:  Sunday July 16 2023 20:06:12 EDT Ventricular Rate:  95 PR Interval:  227 QRS Duration:  91 QT Interval:  351 QTC Calculation: 442 R Axis:   -61  Text Interpretation: Sinus rhythm Prolonged PR interval Left atrial enlargement  Left anterior fascicular block RSR' in V1 or V2, right VCD or RVH when compared to prior, st abnormalities present No STEMI Confirmed by Justino Boze, Thayer Ohm ([id number]) on 07/16/2023 9:18:52 PM  Radiology CT ANGIO HEAD NECK W WO CM  Result Date: 07/16/2023 CLINICAL DATA:  Chest pain and hypertension EXAM: CT ANGIOGRAPHY HEAD AND NECK WITH AND WITHOUT CONTRAST TECHNIQUE: Multidetector CT imaging of the head and neck was performed using the standard protocol during bolus administration of intravenous contrast. Multiplanar CT image reconstructions and MIPs were obtained to evaluate the vascular anatomy. Carotid stenosis measurements (when applicable) are obtained utilizing NASCET criteria, using the distal internal carotid diameter as the denominator. RADIATION DOSE REDUCTION: This exam was performed according to the departmental dose-optimization program which includes automated exposure control, adjustment of the mA and/or kV according to patient size and/or use of iterative reconstruction technique. CONTRAST:  80mL OMNIPAQUE IOHEXOL 350 MG/ML SOLN COMPARISON:  None Available. FINDINGS: CT HEAD FINDINGS Brain: There is no mass, hemorrhage or extra-axial collection. The size and configuration of the ventricles and extra-axial CSF spaces are normal. There is no acute or chronic infarction. The brain parenchyma is normal. Skull: The visualized skull base, calvarium and extracranial soft tissues  are normal. Sinuses/Orbits: No fluid levels or advanced mucosal thickening of the visualized paranasal sinuses. No mastoid or middle ear effusion. The orbits are normal. CTA NECK FINDINGS SKELETON: There is no bony spinal canal stenosis. No lytic or blastic lesion. OTHER NECK: Normal pharynx, larynx and major salivary glands. No cervical lymphadenopathy. Unremarkable thyroid gland. UPPER CHEST: No pneumothorax or pleural effusion. No nodules or masses. AORTIC ARCH: There is no calcific atherosclerosis of the aortic arch. Conventional 3 vessel aortic branching pattern. RIGHT CAROTID SYSTEM: Normal without aneurysm, dissection or stenosis. LEFT CAROTID SYSTEM: Normal without aneurysm, dissection or stenosis. VERTEBRAL ARTERIES: Left dominant configuration.There is no dissection, occlusion or flow-limiting stenosis to the skull base (V1-V3 segments). CTA HEAD FINDINGS POSTERIOR CIRCULATION: --Vertebral arteries: Normal V4 segments. --Inferior cerebellar arteries: Normal. --Basilar artery: Normal. --Superior cerebellar arteries: Normal. --Posterior cerebral arteries (PCA): Normal. ANTERIOR CIRCULATION: --Intracranial internal carotid arteries: Normal. --Anterior cerebral arteries (ACA): Normal. Both A1 segments are present. Patent anterior communicating artery (a-comm). --Middle cerebral arteries (MCA): Normal. VENOUS SINUSES: As permitted by contrast timing, patent. ANATOMIC VARIANTS: None Review of the MIP images confirms the above findings. IMPRESSION: Normal CTA of the head and neck. Electronically Signed   By: Deatra Robinson M.D.   On: 07/16/2023 22:16   DG Chest 2 View  Result Date: 07/16/2023 CLINICAL DATA:  Chest pain. EXAM: CHEST - 2 VIEW COMPARISON:  October 30, 2019. FINDINGS: The heart size and mediastinal contours are within normal limits. Both lungs are clear. The visualized skeletal structures are unremarkable. IMPRESSION: No active cardiopulmonary disease. Electronically Signed   By: Lupita Raider  M.D.   On: 07/16/2023 20:33    Procedures Procedures    CRITICAL CARE Performed by: Canary Brim Vena Bassinger Total critical care time: 35 minutes Critical care time was exclusive of separately billable procedures and treating other patients. Critical care was necessary to treat or prevent imminent or life-threatening deterioration. Critical care was time spent personally by me on the following activities: development of treatment plan with patient and/or surrogate as well as nursing, discussions with consultants, evaluation of patient's response to treatment, examination of patient, obtaining history from patient or surrogate, ordering and performing treatments and interventions, ordering and review of laboratory studies, ordering and review of radiographic studies, pulse oximetry and  re-evaluation of patient's condition.  Medications Ordered in ED Medications  insulin regular, human (MYXREDLIN) 100 units/ 100 mL infusion (has no administration in time range)  lactated ringers infusion (has no administration in time range)  dextrose 5 % in lactated ringers infusion (has no administration in time range)  dextrose 50 % solution 0-50 mL (has no administration in time range)  losartan (COZAAR) tablet 25 mg (has no administration in time range)  atorvastatin (LIPITOR) tablet 40 mg (has no administration in time range)  enoxaparin (LOVENOX) injection 40 mg (has no administration in time range)  acetaminophen (TYLENOL) tablet 650 mg (has no administration in time range)    Or  acetaminophen (TYLENOL) suppository 650 mg (has no administration in time range)  oxyCODONE (Oxy IR/ROXICODONE) immediate release tablet 5 mg (has no administration in time range)  ondansetron (ZOFRAN) tablet 4 mg (has no administration in time range)    Or  ondansetron (ZOFRAN) injection 4 mg (has no administration in time range)  senna-docusate (Senokot-S) tablet 1 tablet (has no administration in time range)  aspirin EC  tablet 81 mg (has no administration in time range)  labetalol (NORMODYNE) injection 20 mg (has no administration in time range)  hydrALAZINE (APRESOLINE) injection 10 mg (has no administration in time range)  labetalol (NORMODYNE) injection 10 mg (10 mg Intravenous Given 07/16/23 2214)  iohexol (OMNIPAQUE) 350 MG/ML injection 80 mL (80 mLs Intravenous Contrast Given 07/16/23 2153)  aspirin chewable tablet 324 mg (324 mg Oral Given 07/16/23 2225)    ED Course/ Medical Decision Making/ A&P                             Medical Decision Making Amount and/or Complexity of Data Reviewed Labs: ordered. Radiology: ordered.  Risk OTC drugs. Prescription drug management. Decision regarding hospitalization.    Serenity Rapa is a 57 y.o. male with a past medical history significant for hypertension, hyperlipidemia, and diabetes who presents with chest pain today as well as transient speech difficulties.  According to patient, he was at a normal day until about 2 PM when he started having chest discomfort.  He reports as a gripping and pressure in his central chest that does not radiate.  He denies diaphoresis but does report intermittent shortness of breath.  He reports he has not had this pain before.  He denies any previous cardiac history.  He says that his chest pain is now gone but twice this evening at 7 PM and around 8 PM he had difficulty with his speech that came and went.  He reports he could not get any words out lasting several minutes.  He reports his speech is back to baseline now.  He otherwise denied other neurologic deficits and has no history of TIA or stroke.  On arrival, blood pressures over 200.  He reports he does take his medications take aspirin.   Patient otherwise reports no recent fevers, chills, congestion, cough, nausea, vomiting, constipation, diarrhea, or urinary symptoms.  He denies any new leg pain or leg swelling.  He reports he does get sweaty at work but did not have  that today.  On exam, lungs were clear.  Chest was nontender.  I cannot reduce his discomfort.  No murmur initially.  Abdomen nontender.  He does have pulses in extremities.  No carotid bruit on my exam.  No focal neurologic deficits with intact sensation and strength in extremities.  Symmetric smile.  Pupils symmetric  and reactive with normal extraocular movements.  Legs nontender and not edematous.  Blood pressures over 200.  EKG shows some ST abnormality but no STEMI.  Patient had some workup starting in triage including labs and his troponin was elevated in the 70s.  Will trend.  I am somewhat concerned about NSTEMI however given his blood pressure over 200 with the transient neurologic complaints, I do feel need to rule out dissection.  I spoke with the CT tech and we will get CTA of the head and neck and he will include the upper part of the chest to look at the aortic arch.  Will trend troponin, will get other TIA workup as well.  Anticipate admission for further management of suspected NSTEMI versus dissection and possible TIA.  Also glucose elevated in the 500s but no evidence of DKA.  Will likely need glucose management during this as well.  Patient will need admission after workup is completed.  CTA of the head and neck did not show evidence of dissection from the aorta into the head.  No evidence of stroke.  Will call cardiology for possible NSTEMI versus hypertensive urgency/emergency.  10:35 PM Spoke to cardiology who suspects this is likely demand ischemia in the setting of this hypertensive emergency.  Blood pressure has responded and blood pressure is now 170s after the labetalol.  They would prefer to hold on heparinization for possible NSTEMI unless troponin is rising precipitously.  They will see the patient in consultation but agree that patient needs admission to medicine for blood pressure control, glucose management, and likely neurology involvement given his transient speech  difficulty.  Patient may end up needing MRI as well.  Swollen drip ordered for hyperglycemia management.  Does not appear to be in DKA with normal anion gap.  Will call medicine for admission and anticipate he will need admission to [facility] to have both neurology and cardiology available if symptoms were to change, worsen, and need intervention.         Final Clinical Impression(s) / ED Diagnoses Final diagnoses:  Nonspecific chest pain  Elevated troponin  Transient speech disturbance  Hypertensive emergency   Clinical Impression: 1. Nonspecific chest pain   2. Elevated troponin   3. Transient speech disturbance   4. Hypertensive emergency     Disposition: Admit  This note was prepared with assistance of Dragon voice recognition software. Occasional wrong-word or sound-a-like substitutions may have occurred due to the inherent limitations of voice recognition software.     Townes Fuhs, Canary Brim, MD 07/16/23 [phone]

## 2023-07-17 ENCOUNTER — Inpatient Hospital Stay (HOSPITAL_COMMUNITY): Payer: 59

## 2023-07-17 ENCOUNTER — Other Ambulatory Visit: Payer: Self-pay

## 2023-07-17 ENCOUNTER — Other Ambulatory Visit (HOSPITAL_COMMUNITY): Payer: 59

## 2023-07-17 DIAGNOSIS — R7989 Other specified abnormal findings of blood chemistry: Secondary | ICD-10-CM | POA: Diagnosis not present

## 2023-07-17 DIAGNOSIS — I214 Non-ST elevation (NSTEMI) myocardial infarction: Secondary | ICD-10-CM | POA: Diagnosis not present

## 2023-07-17 DIAGNOSIS — R479 Unspecified speech disturbances: Secondary | ICD-10-CM | POA: Diagnosis not present

## 2023-07-17 DIAGNOSIS — I161 Hypertensive emergency: Secondary | ICD-10-CM | POA: Diagnosis not present

## 2023-07-17 LAB — CBG MONITORING, ED
Glucose-Capillary: 205 mg/dL — ABNORMAL HIGH (ref 70–99)
Glucose-Capillary: 205 mg/dL — ABNORMAL HIGH (ref 70–99)
Glucose-Capillary: 222 mg/dL — ABNORMAL HIGH (ref 70–99)
Glucose-Capillary: 239 mg/dL — ABNORMAL HIGH (ref 70–99)
Glucose-Capillary: 246 mg/dL — ABNORMAL HIGH (ref 70–99)
Glucose-Capillary: 318 mg/dL — ABNORMAL HIGH (ref 70–99)

## 2023-07-17 LAB — GLUCOSE, CAPILLARY: Glucose-Capillary: 300 mg/dL — ABNORMAL HIGH (ref 70–99)

## 2023-07-17 LAB — MAGNESIUM: Magnesium: 1.7 mg/dL (ref 1.7–2.4)

## 2023-07-17 LAB — TROPONIN I (HIGH SENSITIVITY)
Troponin I (High Sensitivity): 37 ng/L — ABNORMAL HIGH (ref <18)
Troponin I (High Sensitivity): 43 ng/L — ABNORMAL HIGH (ref <18)

## 2023-07-17 LAB — TSH: TSH: 1.63 u[IU]/mL (ref 0.350–4.500)

## 2023-07-17 MED ORDER — POTASSIUM CHLORIDE CRYS ER 20 MEQ PO TBCR
40.0000 meq | EXTENDED_RELEASE_TABLET | Freq: Once | ORAL | Status: AC
  Administered 2023-07-17: 40 meq via ORAL
  Filled 2023-07-17: qty 2

## 2023-07-17 MED ORDER — POTASSIUM CHLORIDE 20 MEQ PO PACK
40.0000 meq | PACK | Freq: Once | ORAL | Status: AC
  Administered 2023-07-17: 40 meq via ORAL
  Filled 2023-07-17: qty 2

## 2023-07-17 MED ORDER — LOSARTAN POTASSIUM 50 MG PO TABS
50.0000 mg | ORAL_TABLET | Freq: Every day | ORAL | Status: DC
  Administered 2023-07-17 – 2023-07-18 (×2): 50 mg via ORAL
  Filled 2023-07-17: qty 2
  Filled 2023-07-17: qty 1

## 2023-07-17 MED ORDER — CLOPIDOGREL BISULFATE 75 MG PO TABS
75.0000 mg | ORAL_TABLET | Freq: Every day | ORAL | Status: DC
  Administered 2023-07-18: 75 mg via ORAL
  Filled 2023-07-17: qty 1

## 2023-07-17 MED ORDER — CLOPIDOGREL BISULFATE 300 MG PO TABS
300.0000 mg | ORAL_TABLET | Freq: Once | ORAL | Status: AC
  Administered 2023-07-17: 300 mg via ORAL
  Filled 2023-07-17: qty 1

## 2023-07-17 MED ORDER — ATORVASTATIN CALCIUM 40 MG PO TABS
40.0000 mg | ORAL_TABLET | Freq: Every day | ORAL | Status: DC
  Administered 2023-07-17: 40 mg via ORAL
  Filled 2023-07-17: qty 1

## 2023-07-17 NOTE — ED Notes (Signed)
Patient is awake sitting in the bed at this time. Have asked patient if he needed anything and patient denies any needs at this moment. Patient is still in his clothing laying in the bed.

## 2023-07-17 NOTE — Evaluation (Signed)
Physical Therapy Evaluation Patient Details Name: Dylan Abbott MRN: [id number] DOB: Nov 26, 1966 Today's Date: 07/17/2023  History of Present Illness  Pt is a 57 y/o male admitted 7/28 with chest pain and speech changes.  MRI showed no acute neuro abnormality.  NSTEMI--Troponin  Clinical Impression  Pt is at or close to baseline functioning and should be safe at home independently, but significant other can assist PRN. There are no further acute PT needs.  Will sign off at this time.         If plan is discharge home, recommend the following:     Can travel by private vehicle        Equipment Recommendations None recommended by PT  Recommendations for Other Services       Functional Status Assessment Patient has had a recent decline in their functional status and demonstrates the ability to make significant improvements in function in a reasonable and predictable amount of time.     Precautions / Restrictions Precautions Precautions: None      Mobility  Bed Mobility Overal bed mobility: Independent                  Transfers Overall transfer level: Independent                      Ambulation/Gait Ambulation/Gait assistance: Independent Gait Distance (Feet): 300 Feet Assistive device: None Gait Pattern/deviations: WFL(Within Functional Limits)   Gait velocity interpretation: >2.62 ft/sec, indicative of community ambulatory   General Gait Details: steady, able to change speed/direction abruptly.  Stairs Stairs: Yes Stairs assistance: Independent Stair Management: No rails, Alternating pattern, Forwards Number of Stairs: 5 General stair comments: safe without rails  Wheelchair Mobility     Tilt Bed    Modified Rankin (Stroke Patients Only)       Balance Overall balance assessment: Independent                                           Pertinent Vitals/Pain Pain Assessment Pain Assessment: No/denies pain     Home Living Family/patient expects to be discharged to:: Private residence Living Arrangements: Alone Available Help at Discharge: Other (Comment) (significant other PRN) Type of Home: Apartment Home Access: Stairs to enter Entrance Stairs-Rails: Right;Left Entrance Stairs-Number of Steps: flight   Home Layout: One level Home Equipment: None      Prior Function Prior Level of Function : Independent/Modified Independent             Mobility Comments: Independent and drives ADLs Comments: Independent     Hand Dominance        Extremity/Trunk Assessment   Upper Extremity Assessment Upper Extremity Assessment: Overall WFL for tasks assessed    Lower Extremity Assessment Lower Extremity Assessment: Overall WFL for tasks assessed    Cervical / Trunk Assessment Cervical / Trunk Assessment: Normal  Communication   Communication: No difficulties;Other (comment) (speech changes resolved)  Cognition Arousal/Alertness: Awake/alert Behavior During Therapy: WFL for tasks assessed/performed Overall Cognitive Status: Within Functional Limits for tasks assessed                                          General Comments General comments (skin integrity, edema, etc.): vss on RA,  recent BP still high, but not  as critical    Exercises     Assessment/Plan    PT Assessment Patient does not need any further PT services  PT Problem List         PT Treatment Interventions      PT Goals (Current goals can be found in the Care Plan section)  Acute Rehab PT Goals Patient Stated Goal: home and back to work PT Goal Formulation: All assessment and education complete, DC therapy    Frequency       Co-evaluation               AM-PAC PT "6 Clicks" Mobility  Outcome Measure Help needed turning from your back to your side while in a flat bed without using bedrails?: None Help needed moving from lying on your back to sitting on the side of a flat bed  without using bedrails?: None Help needed moving to and from a bed to a chair (including a wheelchair)?: None Help needed standing up from a chair using your arms (e.g., wheelchair or bedside chair)?: None Help needed to walk in hospital room?: None Help needed climbing 3-5 steps with a railing? : None 6 Click Score: 24    End of Session   Activity Tolerance: Patient tolerated treatment well Patient left: in bed;with call bell/phone within reach Nurse Communication: Mobility status PT Visit Diagnosis: Other abnormalities of gait and mobility (R26.89)    Time: 4332-9518 PT Time Calculation (min) (ACUTE ONLY): 21 min   Charges:   PT Evaluation $PT Eval Low Complexity: 1 Low   PT General Charges $$ ACUTE PT VISIT: 1 Visit         07/17/2023  Jacinto Halim., PT Acute Rehabilitation Services [phone]  (office)  Eliseo Gum Raif Chachere 07/17/2023, 6:36 PM

## 2023-07-17 NOTE — ED Notes (Signed)
Contacted [facility] for pt transport. 

## 2023-07-17 NOTE — ED Notes (Signed)
Report given to Brigitte Pulse., RN on 3E.

## 2023-07-17 NOTE — Progress Notes (Signed)
[facility]                                                                               Dylan Abbott, is a 57 y.o. male, DOB - 1966-05-06, ZOX:[id number] Admit date - 07/16/2023    Outpatient Primary MD for the patient is Shade Flood, MD  LOS - 1  days    Brief summary   57 y.o. male with medical history significant of hypertension, hyperlipidemia who presents emergency department with chest pain, speech changes.  Patient's been having several hours of substernal chest pressure, difficulty talking.  He presented to the emergency department where he was found to be profoundly hypertensive with systolics in the 200s.  CT angio head and neck which showed no abnormalities. Chest x-ray showed no active disease. Patient was admitted due to elevated troponin and need for neurologic evaluation to [facility]. Emergency department provider spoke with cardiology who recommended holding off initiation of heparin drip.    Assessment & Plan    Assessment and Plan:  TIA/ Left sided weakness and Speech abnormalities;  MRI brain without contrast showed Mild to moderate for age cerebral white matter signal changes, with  chronic small vessel disease.  CT angio of the head and neck showed Normal CTA of the head and neck.  He was started on aspirin 81 mg and plavix 75 mg daily.  Check lipid panel, A1c, therapy evaluations ordered.  Lipitor 40 mg daily.  Neurology consulted.    Hypertensive Emergency Permissive hypertension 200/110 mm Hg    NSTEMI: Elevated troponin's  with chest pressures EKG - NSR, suspect demand ischemia from uncontrolled hypertension.  Plavix loading dose of 300 mg followed 75 mg daily.   AKI  Resolved.   Type 2 DM;  Non insulin dependent  Continue with Levemir 20 mg daily and SSI.  Get A1c.    Hypokalemia:  Replaced.    Hyperlipidemia:  Continue with lipitor     Estimated body mass index is 31.19 kg/m as calculated from the  following:   Height as of this encounter: 6' (1.829 m).   Weight as of this encounter: 104.3 kg.  Code Status: full code.  DVT Prophylaxis:  enoxaparin (LOVENOX) injection 40 mg Start: 07/17/23 1000 SCDs Start: 07/16/23 2303   Level of Care: Level of care: Telemetry Cardiac Family Communication: none at bedside  Disposition Plan:     Remains inpatient appropriate:    Procedures:  MRI brain Echocardiogram pending.   Consultants:   Neurology.   Antimicrobials:   Anti-infectives (From admission, onward)    None        Medications  Scheduled Meds:  aspirin EC  81 mg Oral Daily   atorvastatin  40 mg Oral QHS   enoxaparin (LOVENOX) injection  40 mg Subcutaneous Q24H   insulin aspart  0-24 Units Subcutaneous Q4H   insulin detemir  20 Units Subcutaneous Daily   Continuous Infusions:  lactated ringers Stopped (07/17/23 1105)   PRN Meds:.acetaminophen **OR** acetaminophen, dextrose, hydrALAZINE, labetalol, ondansetron **OR** ondansetron (ZOFRAN) IV, oxyCODONE, senna-docusate    Subjective:   Dylan Abbott was seen and examined today.  He reports speech has improved.  Objective:   Vitals:   07/17/23 0822 07/17/23 1000 07/17/23 1229 07/17/23 1300  BP:  (!) 164/87  (!) 173/94  Pulse: 69 69  77  Resp:  18  18  Temp: 97.9 F (36.6 C)  98.3 F (36.8 C)   TempSrc: Oral  Oral   SpO2: 97% 99%  97%  Weight:      Height:       No intake or output data in the 24 hours ending 07/17/23 1328 Filed Weights   07/16/23 2226  Weight: 104.3 kg     Exam General exam: Appears calm and comfortable  Respiratory system: Clear to auscultation. Respiratory effort normal. Cardiovascular system: S1 & S2 heard, RRR. No JVD, Gastrointestinal system: Abdomen is nondistended, soft and nontender. Central nervous system: Alert and oriented, speech abnormal.  Extremities: Symmetric 5 x 5 power. Skin: No rashes,  Psychiatry: Mood & affect appropriate.     Data Reviewed:  I  have personally reviewed following labs and imaging studies   CBC Lab Results  Component Value Date   WBC 6.6 07/17/2023   RBC 4.58 07/17/2023   HGB 13.8 07/17/2023   HCT 38.1 (L) 07/17/2023   MCV 83.2 07/17/2023   MCH 30.1 07/17/2023   PLT 155 07/17/2023   MCHC 36.2 (H) 07/17/2023   RDW 13.2 07/17/2023   LYMPHSABS 1.8 07/16/2023   MONOABS 0.5 07/16/2023   EOSABS 0.1 07/16/2023   BASOSABS 0.0 07/16/2023     Last metabolic panel Lab Results  Component Value Date   NA 136 07/17/2023   K 3.1 (L) 07/17/2023   CL 99 07/17/2023   CO2 27 07/17/2023   BUN 16 07/17/2023   CREATININE 0.95 07/17/2023   GLUCOSE 224 (H) 07/17/2023   GFRNONAA >60 07/17/2023   GFRAA 43 (L) 02/01/2021   CALCIUM 9.0 07/17/2023   PROT 7.2 07/16/2023   ALBUMIN 3.8 07/16/2023   LABGLOB 2.7 02/01/2021   AGRATIO 1.6 02/01/2021   BILITOT 0.7 07/16/2023   ALKPHOS 65 07/16/2023   AST 24 07/16/2023   ALT 20 07/16/2023   ANIONGAP 10 07/17/2023    CBG (last 3)  Recent Labs    07/17/23 0442 07/17/23 0745 07/17/23 1136  GLUCAP 205* 205* 222*      Coagulation Profile: Recent Labs  Lab 07/16/23 2020  INR 1.0     Radiology Studies: MR BRAIN WO CONTRAST  Result Date: 07/17/2023 CLINICAL DATA:  57 year old male with chest pain, hypertension, neurologic deficit. EXAM: MRI HEAD WITHOUT CONTRAST TECHNIQUE: Multiplanar, multiecho pulse sequences of the brain and surrounding structures were obtained without intravenous contrast. COMPARISON:  CT head, CTA head and neck yesterday. FINDINGS: Brain: No restricted diffusion to suggest acute infarction. No midline shift, mass effect, evidence of mass lesion, ventriculomegaly, extra-axial collection or acute intracranial hemorrhage. Cervicomedullary junction and pituitary are within normal limits. Scattered generally small cerebral white matter T2 and FLAIR hyperintensity in both hemispheres is mild to moderate for age in a nonspecific configuration. No cortical  encephalomalacia identified. No chronic cerebral blood products identified. Deep gray nuclei, brainstem and cerebellum are within normal limits. Vascular: Major intracranial vascular flow voids are preserved. Skull and upper cervical spine: Negative. Visualized bone marrow signal is within normal limits. Sinuses/Orbits: Negative orbits. Left maxillary sinus mucosal thickening or retention cyst. Other: Mastoids are well aerated. Visible internal auditory structures appear normal. Negative visible scalp and face. IMPRESSION: 1. No acute intracranial abnormality. 2. Mild to moderate for age cerebral white matter signal changes, nonspecific but most commonly  due to chronic small vessel disease. Electronically Signed   By: Odessa Fleming M.D.   On: 07/17/2023 07:22   CT ANGIO HEAD NECK W WO CM  Result Date: 07/16/2023 CLINICAL DATA:  Chest pain and hypertension EXAM: CT ANGIOGRAPHY HEAD AND NECK WITH AND WITHOUT CONTRAST TECHNIQUE: Multidetector CT imaging of the head and neck was performed using the standard protocol during bolus administration of intravenous contrast. Multiplanar CT image reconstructions and MIPs were obtained to evaluate the vascular anatomy. Carotid stenosis measurements (when applicable) are obtained utilizing NASCET criteria, using the distal internal carotid diameter as the denominator. RADIATION DOSE REDUCTION: This exam was performed according to the departmental dose-optimization program which includes automated exposure control, adjustment of the mA and/or kV according to patient size and/or use of iterative reconstruction technique. CONTRAST:  80mL OMNIPAQUE IOHEXOL 350 MG/ML SOLN COMPARISON:  None Available. FINDINGS: CT HEAD FINDINGS Brain: There is no mass, hemorrhage or extra-axial collection. The size and configuration of the ventricles and extra-axial CSF spaces are normal. There is no acute or chronic infarction. The brain parenchyma is normal. Skull: The visualized skull base, calvarium  and extracranial soft tissues are normal. Sinuses/Orbits: No fluid levels or advanced mucosal thickening of the visualized paranasal sinuses. No mastoid or middle ear effusion. The orbits are normal. CTA NECK FINDINGS SKELETON: There is no bony spinal canal stenosis. No lytic or blastic lesion. OTHER NECK: Normal pharynx, larynx and major salivary glands. No cervical lymphadenopathy. Unremarkable thyroid gland. UPPER CHEST: No pneumothorax or pleural effusion. No nodules or masses. AORTIC ARCH: There is no calcific atherosclerosis of the aortic arch. Conventional 3 vessel aortic branching pattern. RIGHT CAROTID SYSTEM: Normal without aneurysm, dissection or stenosis. LEFT CAROTID SYSTEM: Normal without aneurysm, dissection or stenosis. VERTEBRAL ARTERIES: Left dominant configuration.There is no dissection, occlusion or flow-limiting stenosis to the skull base (V1-V3 segments). CTA HEAD FINDINGS POSTERIOR CIRCULATION: --Vertebral arteries: Normal V4 segments. --Inferior cerebellar arteries: Normal. --Basilar artery: Normal. --Superior cerebellar arteries: Normal. --Posterior cerebral arteries (PCA): Normal. ANTERIOR CIRCULATION: --Intracranial internal carotid arteries: Normal. --Anterior cerebral arteries (ACA): Normal. Both A1 segments are present. Patent anterior communicating artery (a-comm). --Middle cerebral arteries (MCA): Normal. VENOUS SINUSES: As permitted by contrast timing, patent. ANATOMIC VARIANTS: None Review of the MIP images confirms the above findings. IMPRESSION: Normal CTA of the head and neck. Electronically Signed   By: Deatra Robinson M.D.   On: 07/16/2023 22:16   DG Chest 2 View  Result Date: 07/16/2023 CLINICAL DATA:  Chest pain. EXAM: CHEST - 2 VIEW COMPARISON:  October 30, 2019. FINDINGS: The heart size and mediastinal contours are within normal limits. Both lungs are clear. The visualized skeletal structures are unremarkable. IMPRESSION: No active cardiopulmonary disease. Electronically  Signed   By: Lupita Raider M.D.   On: 07/16/2023 20:33       Kathlen Mody M.D. [facility] 07/17/2023, 1:28 PM  Available via Epic secure chat 7am-7pm After 7 pm, please refer to night coverage provider listed on amion.

## 2023-07-17 NOTE — Progress Notes (Signed)
  Echocardiogram 2D Echocardiogram has been performed.  Milda Smart 07/17/2023, 3:46 PM

## 2023-07-17 NOTE — ED Notes (Signed)
ED TO INPATIENT HANDOFF REPORT  Name/Age/Gender [name] 57 y.o. male  Code Status    Code Status Orders  (From admission, onward)           Start     Ordered   07/16/23 2303  Full code  Continuous       Question:  By:  Answer:  Consent: discussion documented in EHR   07/16/23 2303           Code Status History     Date Active Date Inactive Code Status Order ID Comments User Context   10/31/2019 0017 11/04/2019 1847 Full Code [id number]  Pearson Grippe, MD ED       Home/SNF/Other Home  Chief Complaint NSTEMI (non-ST elevated myocardial infarction) [facility]) [I21.4]  Level of Care/Admitting Diagnosis ED Disposition     ED Disposition  Admit   Condition  --   Comment  Hospital Area: [facility] [[id number]]  Level of Care: Telemetry Cardiac [[id number]]  May admit patient to [facility] or [facility] if equivalent level of care is available:: Yes  Covid Evaluation: Asymptomatic - no recent exposure (last 10 days) testing not required  Diagnosis: NSTEMI (non-ST elevated myocardial infarction) [facility]) [[id number]]  Admitting Physician: Alan Mulder [[id number]]  Attending Physician: Alan Mulder [phone]  Certification:: I certify this patient will need inpatient services for at least 2 midnights  Estimated Length of Stay: 3          Medical History Past Medical History:  Diagnosis Date   Diabetes mellitus without complication (HCC)    Hyperlipidemia    Hypertension     Allergies No Known Allergies  IV Location/Drains/Wounds Patient Lines/Drains/Airways Status     Active Line/Drains/Airways     Name Placement date Placement time Site Days   Peripheral IV 07/16/23 18 G 1.16" Anterior;Distal;Right;Upper Arm 07/16/23  2150  Arm  1            Labs/Imaging Results for orders placed or performed during the hospital encounter of 07/16/23 (from the past 48 hour(s))  Basic metabolic panel     Status: Abnormal   Collection Time: 07/16/23   8:20 PM  Result Value Ref Range   Sodium 130 (L) 135 - 145 mmol/L   Potassium 3.3 (L) 3.5 - 5.1 mmol/L   Chloride 93 (L) 98 - 111 mmol/L   CO2 25 22 - 32 mmol/L   Glucose, Bld 527 (HH) 70 - 99 mg/dL    Comment: CRITICAL RESULT CALLED TO, READ BACK BY AND VERIFIED WITH Clois Dupes RN @ 2111 07/16/23. GILBERTL Glucose reference range applies only to samples taken after fasting for at least 8 hours.    BUN 19 6 - 20 mg/dL   Creatinine, Ser 5.85 (H) 0.61 - 1.24 mg/dL   Calcium 9.3 8.9 - 27.7 mg/dL   GFR, Estimated 55 (L) >60 mL/min    Comment: (NOTE) Calculated using the CKD-EPI Creatinine Equation (2021)    Anion gap 12 5 - 15    Comment: Performed at [facility], [address] [place], [place] [address]  CBC     Status: Abnormal   Collection Time: 07/16/23  8:20 PM  Result Value Ref Range   WBC 6.6 4.0 - 10.5 K/uL   RBC 4.86 4.22 - 5.81 MIL/uL   Hemoglobin 14.8 13.0 - 17.0 g/dL   HCT 53.6 14.4 - 31.5 %   MCV 83.1 80.0 - 100.0 fL   MCH 30.5 26.0 - 34.0 pg  MCHC 36.6 (H) 30.0 - 36.0 g/dL   RDW 54.0 98.1 - 19.1 %   Platelets 168 150 - 400 K/uL   nRBC 0.0 0.0 - 0.2 %    Comment: Performed at [facility], [address], [place], [place] [address]  Troponin I (High Sensitivity)     Status: Abnormal   Collection Time: 07/16/23  8:20 PM  Result Value Ref Range   Troponin I (High Sensitivity) 78 (H) <18 ng/L    Comment: (NOTE) Elevated high sensitivity troponin I (hsTnI) values and significant  changes across serial measurements may suggest ACS but many other  chronic and acute conditions are known to elevate hsTnI results.  Refer to the "Links" section for chest pain algorithms and additional  guidance. Performed at [facility], [address], [place], [place] [address]   Lipase, blood     Status: Abnormal   Collection Time: 07/16/23  8:20 PM  Result Value Ref Range   Lipase 70 (H) 11 - 51 U/L    Comment: Performed  at [facility], [address], [place], [place] [address]  Protime-INR     Status: None   Collection Time: 07/16/23  8:20 PM  Result Value Ref Range   Prothrombin Time 13.1 11.4 - 15.2 seconds   INR 1.0 0.8 - 1.2    Comment: (NOTE) INR goal varies based on device and disease states. Performed at [facility], [address] [place], [place] [address]   Troponin I (High Sensitivity)     Status: Abnormal   Collection Time: 07/16/23 10:16 PM  Result Value Ref Range   Troponin I (High Sensitivity) 65 (H) <18 ng/L    Comment: (NOTE) Elevated high sensitivity troponin I (hsTnI) values and significant  changes across serial measurements may suggest ACS but many other  chronic and acute conditions are known to elevate hsTnI results.  Refer to the "Links" section for chest pain algorithms and additional  guidance. Performed at [facility], [address], [place], [place] [address]   Hepatic function panel     Status: None   Collection Time: 07/16/23 10:16 PM  Result Value Ref Range   Total Protein 7.2 6.5 - 8.1 g/dL   Albumin 3.8 3.5 - 5.0 g/dL   AST 24 15 - 41 U/L   ALT 20 0 - 44 U/L   Alkaline Phosphatase 65 38 - 126 U/L   Total Bilirubin 0.7 0.3 - 1.2 mg/dL   Bilirubin, Direct 0.1 0.0 - 0.2 mg/dL   Indirect Bilirubin 0.6 0.3 - 0.9 mg/dL    Comment: Performed at [facility], [address], [place], [place] [address]  TSH     Status: None   Collection Time: 07/16/23 10:16 PM  Result Value Ref Range   TSH 1.630 0.350 - 4.500 uIU/mL    Comment: Performed by a 3rd Generation assay with a functional sensitivity of <=0.01 uIU/mL. Performed at [facility], [address], [place], [place] [address]   Basic metabolic panel     Status: Abnormal   Collection Time: 07/16/23 10:16 PM  Result Value Ref Range   Sodium 130 (L) 135 - 145 mmol/L   Potassium 3.2 (L) 3.5 - 5.1 mmol/L    Chloride 94 (L) 98 - 111 mmol/L   CO2 27 22 - 32 mmol/L   Glucose, Bld 416 (H) 70 - 99 mg/dL    Comment: Glucose reference range applies  only to samples taken after fasting for at least 8 hours.   BUN 20 6 - 20 mg/dL   Creatinine, Ser 1.02 (H) 0.61 - 1.24 mg/dL   Calcium 9.0 8.9 - 72.5 mg/dL   GFR, Estimated >36 >64 mL/min    Comment: (NOTE) Calculated using the CKD-EPI Creatinine Equation (2021)    Anion gap 9 5 - 15    Comment: Performed at [facility], [address], [place], [place] [address]  CBG monitoring, ED     Status: Abnormal   Collection Time: 07/16/23 11:02 PM  Result Value Ref Range   Glucose-Capillary 389 (H) 70 - 99 mg/dL    Comment: Glucose reference range applies only to samples taken after fasting for at least 8 hours.  Urinalysis, Routine w reflex microscopic -Urine, Clean Catch     Status: Abnormal   Collection Time: 07/16/23 11:03 PM  Result Value Ref Range   Color, Urine STRAW (A) YELLOW   APPearance CLEAR CLEAR   Specific Gravity, Urine 1.043 (H) 1.005 - 1.030   pH 7.0 5.0 - 8.0   Glucose, UA >=500 (A) NEGATIVE mg/dL   Hgb urine dipstick NEGATIVE NEGATIVE   Bilirubin Urine NEGATIVE NEGATIVE   Ketones, ur NEGATIVE NEGATIVE mg/dL   Protein, ur NEGATIVE NEGATIVE mg/dL   Nitrite NEGATIVE NEGATIVE   Leukocytes,Ua NEGATIVE NEGATIVE   RBC / HPF 0-5 0 - 5 RBC/hpf   WBC, UA 0-5 0 - 5 WBC/hpf   Bacteria, UA NONE SEEN NONE SEEN   Squamous Epithelial / HPF 0-5 0 - 5 /HPF    Comment: Performed at [facility], [address], [place], [place] [address]  CBC with Differential/Platelet     Status: Abnormal   Collection Time: 07/16/23 11:05 PM  Result Value Ref Range   WBC 6.3 4.0 - 10.5 K/uL   RBC 4.76 4.22 - 5.81 MIL/uL   Hemoglobin 14.4 13.0 - 17.0 g/dL   HCT 63.8 75.6 - 43.3 %   MCV 83.0 80.0 - 100.0 fL   MCH 30.3 26.0 - 34.0 pg   MCHC 36.5 (H) 30.0 - 36.0 g/dL   RDW 29.5 18.8 - 41.6 %   Platelets 161 150 - 400 K/uL    nRBC 0.0 0.0 - 0.2 %   Neutrophils Relative % 60 %   Neutro Abs 3.9 1.7 - 7.7 K/uL   Lymphocytes Relative 28 %   Lymphs Abs 1.8 0.7 - 4.0 K/uL   Monocytes Relative 8 %   Monocytes Absolute 0.5 0.1 - 1.0 K/uL   Eosinophils Relative 2 %   Eosinophils Absolute 0.1 0.0 - 0.5 K/uL   Basophils Relative 1 %   Basophils Absolute 0.0 0.0 - 0.1 K/uL   Immature Granulocytes 1 %   Abs Immature Granulocytes 0.03 0.00 - 0.07 K/uL    Comment: Performed at [facility], [address] [place], [place] [address]  CBG monitoring, ED     Status: Abnormal   Collection Time: 07/17/23 12:39 AM  Result Value Ref Range   Glucose-Capillary 318 (H) 70 - 99 mg/dL    Comment: Glucose reference range applies only to samples taken after fasting for at least 8 hours.  CBG monitoring, ED     Status: Abnormal   Collection Time: 07/17/23  1:50 AM  Result Value Ref Range   Glucose-Capillary 239 (H) 70 - 99 mg/dL    Comment: Glucose reference range applies only to samples taken after fasting for at least  8 hours.  CBG monitoring, ED     Status: Abnormal   Collection Time: 07/17/23  4:42 AM  Result Value Ref Range   Glucose-Capillary 205 (H) 70 - 99 mg/dL    Comment: Glucose reference range applies only to samples taken after fasting for at least 8 hours.  Basic metabolic panel     Status: Abnormal   Collection Time: 07/17/23  5:39 AM  Result Value Ref Range   Sodium 136 135 - 145 mmol/L   Potassium 3.1 (L) 3.5 - 5.1 mmol/L   Chloride 99 98 - 111 mmol/L   CO2 27 22 - 32 mmol/L   Glucose, Bld 224 (H) 70 - 99 mg/dL    Comment: Glucose reference range applies only to samples taken after fasting for at least 8 hours.   BUN 16 6 - 20 mg/dL   Creatinine, Ser 7.82 0.61 - 1.24 mg/dL   Calcium 9.0 8.9 - 95.6 mg/dL   GFR, Estimated >21 >30 mL/min    Comment: (NOTE) Calculated using the CKD-EPI Creatinine Equation (2021)    Anion gap 10 5 - 15    Comment: Performed at [facility], [address], [place], [place] [address]  CBC     Status: Abnormal   Collection Time: 07/17/23  5:39 AM  Result Value Ref Range   WBC 6.6 4.0 - 10.5 K/uL   RBC 4.58 4.22 - 5.81 MIL/uL   Hemoglobin 13.8 13.0 - 17.0 g/dL   HCT 46.9 (L) 62.9 - 52.8 %   MCV 83.2 80.0 - 100.0 fL   MCH 30.1 26.0 - 34.0 pg   MCHC 36.2 (H) 30.0 - 36.0 g/dL   RDW 41.3 24.4 - 01.0 %   Platelets 155 150 - 400 K/uL   nRBC 0.0 0.0 - 0.2 %    Comment: Performed at [facility], [address], [place], [place] [address]  CBG monitoring, ED     Status: Abnormal   Collection Time: 07/17/23  7:45 AM  Result Value Ref Range   Glucose-Capillary 205 (H) 70 - 99 mg/dL    Comment: Glucose reference range applies only to samples taken after fasting for at least 8 hours.  CBG monitoring, ED     Status: Abnormal   Collection Time: 07/17/23 11:36 AM  Result Value Ref Range   Glucose-Capillary 222 (H) 70 - 99 mg/dL    Comment: Glucose reference range applies only to samples taken after fasting for at least 8 hours.   MR BRAIN WO CONTRAST  Result Date: 07/17/2023 CLINICAL DATA:  57 year old male with chest pain, hypertension, neurologic deficit. EXAM: MRI HEAD WITHOUT CONTRAST TECHNIQUE: Multiplanar, multiecho pulse sequences of the brain and surrounding structures were obtained without intravenous contrast. COMPARISON:  CT head, CTA head and neck yesterday. FINDINGS: Brain: No restricted diffusion to suggest acute infarction. No midline shift, mass effect, evidence of mass lesion, ventriculomegaly, extra-axial collection or acute intracranial hemorrhage. Cervicomedullary junction and pituitary are within normal limits. Scattered generally small cerebral white matter T2 and FLAIR hyperintensity in both hemispheres is mild to moderate for age in a nonspecific configuration. No cortical encephalomalacia identified. No chronic cerebral blood products identified. Deep gray nuclei, brainstem and cerebellum  are within normal limits. Vascular: Major intracranial vascular flow voids are preserved. Skull and upper cervical spine: Negative. Visualized bone marrow signal is within normal limits. Sinuses/Orbits: Negative orbits. Left maxillary sinus mucosal thickening or retention cyst. Other: Mastoids are well aerated. Visible internal auditory structures  appear normal. Negative visible scalp and face. IMPRESSION: 1. No acute intracranial abnormality. 2. Mild to moderate for age cerebral white matter signal changes, nonspecific but most commonly due to chronic small vessel disease. Electronically Signed   By: Odessa Fleming M.D.   On: 07/17/2023 07:22   CT ANGIO HEAD NECK W WO CM  Result Date: 07/16/2023 CLINICAL DATA:  Chest pain and hypertension EXAM: CT ANGIOGRAPHY HEAD AND NECK WITH AND WITHOUT CONTRAST TECHNIQUE: Multidetector CT imaging of the head and neck was performed using the standard protocol during bolus administration of intravenous contrast. Multiplanar CT image reconstructions and MIPs were obtained to evaluate the vascular anatomy. Carotid stenosis measurements (when applicable) are obtained utilizing NASCET criteria, using the distal internal carotid diameter as the denominator. RADIATION DOSE REDUCTION: This exam was performed according to the departmental dose-optimization program which includes automated exposure control, adjustment of the mA and/or kV according to patient size and/or use of iterative reconstruction technique. CONTRAST:  80mL OMNIPAQUE IOHEXOL 350 MG/ML SOLN COMPARISON:  None Available. FINDINGS: CT HEAD FINDINGS Brain: There is no mass, hemorrhage or extra-axial collection. The size and configuration of the ventricles and extra-axial CSF spaces are normal. There is no acute or chronic infarction. The brain parenchyma is normal. Skull: The visualized skull base, calvarium and extracranial soft tissues are normal. Sinuses/Orbits: No fluid levels or advanced mucosal thickening of the  visualized paranasal sinuses. No mastoid or middle ear effusion. The orbits are normal. CTA NECK FINDINGS SKELETON: There is no bony spinal canal stenosis. No lytic or blastic lesion. OTHER NECK: Normal pharynx, larynx and major salivary glands. No cervical lymphadenopathy. Unremarkable thyroid gland. UPPER CHEST: No pneumothorax or pleural effusion. No nodules or masses. AORTIC ARCH: There is no calcific atherosclerosis of the aortic arch. Conventional 3 vessel aortic branching pattern. RIGHT CAROTID SYSTEM: Normal without aneurysm, dissection or stenosis. LEFT CAROTID SYSTEM: Normal without aneurysm, dissection or stenosis. VERTEBRAL ARTERIES: Left dominant configuration.There is no dissection, occlusion or flow-limiting stenosis to the skull base (V1-V3 segments). CTA HEAD FINDINGS POSTERIOR CIRCULATION: --Vertebral arteries: Normal V4 segments. --Inferior cerebellar arteries: Normal. --Basilar artery: Normal. --Superior cerebellar arteries: Normal. --Posterior cerebral arteries (PCA): Normal. ANTERIOR CIRCULATION: --Intracranial internal carotid arteries: Normal. --Anterior cerebral arteries (ACA): Normal. Both A1 segments are present. Patent anterior communicating artery (a-comm). --Middle cerebral arteries (MCA): Normal. VENOUS SINUSES: As permitted by contrast timing, patent. ANATOMIC VARIANTS: None Review of the MIP images confirms the above findings. IMPRESSION: Normal CTA of the head and neck. Electronically Signed   By: Deatra Robinson M.D.   On: 07/16/2023 22:16   DG Chest 2 View  Result Date: 07/16/2023 CLINICAL DATA:  Chest pain. EXAM: CHEST - 2 VIEW COMPARISON:  October 30, 2019. FINDINGS: The heart size and mediastinal contours are within normal limits. Both lungs are clear. The visualized skeletal structures are unremarkable. IMPRESSION: No active cardiopulmonary disease. Electronically Signed   By: Lupita Raider M.D.   On: 07/16/2023 20:33    Pending Labs Unresulted Labs (From admission,  onward)     Start     Ordered   07/23/23 0500  Creatinine, serum  (enoxaparin (LOVENOX)    CrCl >/= 30 ml/min)  Weekly,   R     Comments: while on enoxaparin therapy    07/16/23 2303   07/18/23 0500  Lipid panel  Tomorrow morning,   R        07/17/23 1331   07/18/23 0500  Hemoglobin A1c  Tomorrow morning,   R  07/17/23 1331   07/18/23 0500  CBC with Differential/Platelet  Tomorrow morning,   R        07/17/23 1334   07/18/23 0500  Basic metabolic panel  Tomorrow morning,   R        07/17/23 1334   07/17/23 1335  Magnesium  Add-on,   AD        07/17/23 1334   07/16/23 2302  HIV Antibody (routine testing w rflx)  (HIV Antibody (Routine testing w reflex) panel)  Once,   R        07/16/23 2303   07/16/23 2241  CBC with Differential (PNL)  (Hyperglycemia (not DKA or HHS))  ONCE - STAT,   STAT        07/16/23 2240            Vitals/Pain Today's Vitals   07/17/23 0822 07/17/23 1000 07/17/23 1229 07/17/23 1300  BP:  (!) 164/87  (!) 173/94  Pulse: 69 69  77  Resp:  18  18  Temp: 97.9 F (36.6 C)  98.3 F (36.8 C)   TempSrc: Oral  Oral   SpO2: 97% 99%  97%  Weight:      Height:      PainSc:        Isolation Precautions No active isolations  Medications Medications  lactated ringers infusion (0 mLs Intravenous Stopped 07/17/23 1105)  dextrose 50 % solution 0-50 mL (has no administration in time range)  enoxaparin (LOVENOX) injection 40 mg (40 mg Subcutaneous Given 07/17/23 1046)  acetaminophen (TYLENOL) tablet 650 mg (has no administration in time range)    Or  acetaminophen (TYLENOL) suppository 650 mg (has no administration in time range)  oxyCODONE (Oxy IR/ROXICODONE) immediate release tablet 5 mg (has no administration in time range)  ondansetron (ZOFRAN) tablet 4 mg (has no administration in time range)    Or  ondansetron (ZOFRAN) injection 4 mg (has no administration in time range)  senna-docusate (Senokot-S) tablet 1 tablet (has no administration in time  range)  aspirin EC tablet 81 mg (81 mg Oral Given 07/17/23 1046)  labetalol (NORMODYNE) injection 20 mg (20 mg Intravenous Given 07/17/23 0038)  hydrALAZINE (APRESOLINE) injection 10 mg (has no administration in time range)  insulin detemir (LEVEMIR) injection 20 Units (20 Units Subcutaneous Given 07/17/23 1046)  insulin aspart (novoLOG) injection 0-24 Units (8 Units Subcutaneous Given 07/17/23 1158)  atorvastatin (LIPITOR) tablet 40 mg (has no administration in time range)  clopidogrel (PLAVIX) tablet 75 mg (has no administration in time range)  losartan (COZAAR) tablet 50 mg (has no administration in time range)  labetalol (NORMODYNE) injection 10 mg (10 mg Intravenous Given 07/16/23 2214)  iohexol (OMNIPAQUE) 350 MG/ML injection 80 mL (80 mLs Intravenous Contrast Given 07/16/23 2153)  aspirin chewable tablet 324 mg (324 mg Oral Given 07/16/23 2225)  potassium chloride (KLOR-CON) packet 40 mEq (40 mEq Oral Given 07/17/23 0051)  potassium chloride SA (KLOR-CON M) CR tablet 40 mEq (40 mEq Oral Given 07/17/23 0829)    Mobility walks

## 2023-07-18 ENCOUNTER — Other Ambulatory Visit (HOSPITAL_COMMUNITY): Payer: Self-pay

## 2023-07-18 ENCOUNTER — Encounter (HOSPITAL_COMMUNITY)
Admission: EM | Disposition: A | Discharge status: Home / Self Care | Payer: Self-pay | Source: Home / Self Care | Attending: Internal Medicine

## 2023-07-18 DIAGNOSIS — I214 Non-ST elevation (NSTEMI) myocardial infarction: Secondary | ICD-10-CM | POA: Diagnosis not present

## 2023-07-18 DIAGNOSIS — I639 Cerebral infarction, unspecified: Secondary | ICD-10-CM

## 2023-07-18 HISTORY — PX: LOOP RECORDER INSERTION: EP1214

## 2023-07-18 LAB — GLUCOSE, CAPILLARY
Glucose-Capillary: 144 mg/dL — ABNORMAL HIGH (ref 70–99)
Glucose-Capillary: 152 mg/dL — ABNORMAL HIGH (ref 70–99)
Glucose-Capillary: 193 mg/dL — ABNORMAL HIGH (ref 70–99)
Glucose-Capillary: 219 mg/dL — ABNORMAL HIGH (ref 70–99)
Glucose-Capillary: 303 mg/dL — ABNORMAL HIGH (ref 70–99)

## 2023-07-18 LAB — POTASSIUM: Potassium: 3.1 mmol/L — ABNORMAL LOW (ref 3.5–5.1)

## 2023-07-18 LAB — MAGNESIUM: Magnesium: 1.6 mg/dL — ABNORMAL LOW (ref 1.7–2.4)

## 2023-07-18 SURGERY — LOOP RECORDER INSERTION

## 2023-07-18 MED ORDER — INSULIN GLARGINE 100 UNIT/ML SOLOSTAR PEN
25.0000 [IU] | PEN_INJECTOR | Freq: Every day | SUBCUTANEOUS | 0 refills | Status: DC
  Filled 2023-07-18: qty 6, 24d supply, fill #0

## 2023-07-18 MED ORDER — INSULIN PEN NEEDLE 32G X 4 MM MISC
0 refills | Status: DC
  Filled 2023-07-18: qty 100, 30d supply, fill #0

## 2023-07-18 MED ORDER — CLOPIDOGREL BISULFATE 75 MG PO TABS
75.0000 mg | ORAL_TABLET | Freq: Every day | ORAL | 0 refills | Status: AC
  Filled 2023-07-18: qty 19, 19d supply, fill #0

## 2023-07-18 MED ORDER — NIFEDIPINE ER OSMOTIC RELEASE 60 MG PO TB24: 90.0000 mg | ORAL_TABLET | Freq: Every day | ORAL | Status: DC

## 2023-07-18 MED ORDER — ATORVASTATIN CALCIUM 40 MG PO TABS
40.0000 mg | ORAL_TABLET | Freq: Every day | ORAL | 0 refills | Status: DC
  Filled 2023-07-18: qty 60, 60d supply, fill #0

## 2023-07-18 MED ORDER — ORAL CARE MOUTH RINSE: 15.0000 mL | OROMUCOSAL | Status: DC | PRN

## 2023-07-18 MED ORDER — NIFEDIPINE ER 90 MG PO TB24
90.0000 mg | ORAL_TABLET | Freq: Every day | ORAL | 0 refills | Status: DC
  Filled 2023-07-18: qty 30, 30d supply, fill #0

## 2023-07-18 MED ORDER — MAGNESIUM SULFATE 2 GM/50ML IV SOLN
2.0000 g | Freq: Once | INTRAVENOUS | Status: AC
  Administered 2023-07-18: 2 g via INTRAVENOUS
  Filled 2023-07-18: qty 50

## 2023-07-18 MED ORDER — POTASSIUM CHLORIDE CRYS ER 20 MEQ PO TBCR
40.0000 meq | EXTENDED_RELEASE_TABLET | ORAL | Status: AC
  Administered 2023-07-18 (×2): 40 meq via ORAL
  Filled 2023-07-18: qty 2

## 2023-07-18 MED ORDER — LIDOCAINE-EPINEPHRINE 1 %-1:100000 IJ SOLN
INTRAMUSCULAR | Status: DC | PRN
  Administered 2023-07-18: 10 mL

## 2023-07-18 MED ORDER — POTASSIUM CHLORIDE CRYS ER 20 MEQ PO TBCR
40.0000 meq | EXTENDED_RELEASE_TABLET | Freq: Once | ORAL | Status: AC
  Administered 2023-07-18: 40 meq via ORAL

## 2023-07-18 MED ORDER — EZETIMIBE 10 MG PO TABS
10.0000 mg | ORAL_TABLET | Freq: Every day | ORAL | Status: DC
  Administered 2023-07-18: 10 mg via ORAL
  Filled 2023-07-18: qty 1

## 2023-07-18 MED ORDER — ASPIRIN 81 MG PO TBEC
81.0000 mg | DELAYED_RELEASE_TABLET | Freq: Every day | ORAL | 1 refills | Status: DC
  Filled 2023-07-18: qty 120, 120d supply, fill #0

## 2023-07-18 MED ORDER — LIDOCAINE-EPINEPHRINE 1 %-1:100000 IJ SOLN
INTRAMUSCULAR | Status: AC
  Filled 2023-07-18: qty 1

## 2023-07-18 MED ORDER — EZETIMIBE 10 MG PO TABS
10.0000 mg | ORAL_TABLET | Freq: Every day | ORAL | 1 refills | Status: DC
  Filled 2023-07-18: qty 30, 30d supply, fill #0

## 2023-07-18 MED ORDER — NIFEDIPINE ER OSMOTIC RELEASE 60 MG PO TB24
60.0000 mg | ORAL_TABLET | Freq: Every day | ORAL | Status: DC
  Administered 2023-07-18: 60 mg via ORAL
  Filled 2023-07-18: qty 1

## 2023-07-18 MED ORDER — LOSARTAN POTASSIUM 100 MG PO TABS
100.0000 mg | ORAL_TABLET | Freq: Every day | ORAL | 0 refills | Status: DC
  Filled 2023-07-18: qty 60, 60d supply, fill #0

## 2023-07-18 SURGICAL SUPPLY — 2 items
PACK LOOP INSERTION (CUSTOM PROCEDURE TRAY) ×2 IMPLANT
SYSTEM MONITOR REVEAL LINQ II (Prosthesis & Implant Heart) IMPLANT

## 2023-07-18 NOTE — Plan of Care (Signed)

## 2023-07-18 NOTE — Consult Note (Signed)
ELECTROPHYSIOLOGY CONSULT NOTE  Patient ID: Dylan Abbott MRN: [id number], DOB/AGE: Dec 26, 1965   Admit date: 07/16/2023 Date of Consult: 07/18/2023  Primary Physician: Shade Flood, MD Primary Cardiologist: None  Primary Electrophysiologist: New to Dr. Lalla Brothers Reason for Consultation: Cryptogenic stroke; recommendations regarding Implantable Loop Recorder Insurance: [facility]  History of Present Illness EP has been asked to evaluate Dylan Abbott for placement of an implantable loop recorder to monitor for atrial fibrillation by Dr Pearlean Brownie.  The patient was admitted on 07/16/2023 with speech changes and chest pain.  Pt was found to be profoundly hypotensive. HS trop peaked at 78.   Imaging demonstrated no acute abnormalities, felt to have Left-sided TIA.    He has undergone workup for stroke including:  CT head No acute abnormality.  CTA head & neck no LVO or hemodynamically significant stenosis MRI no acute abnormality, chronic small vessel ischemic changes 2D Echo EF 55 to 60%, mild concentric LVH, grade 1 diastolic dysfunction, normal left atrial size, no atrial level shunt Patient will need loop recorder given young age and concern for atrial fibrillation Will not need TEE per Dr. Pearlean Brownie.  LDL 87 HgbA1c 8.1 VTE prophylaxis -Lovenox No antithrombotic prior to admission, now on aspirin 81 mg daily and clopidogrel 75 mg daily for 3 weeks and then aspirin alone. Therapy recommendations:  No follow up needed Disposition: Home   The patient has been monitored on telemetry which has demonstrated sinus rhythm with no arrhythmias.  TEE is recommended, but due to census will likely need to occur as an outpatient.   Echocardiogram as above. Lab work is reviewed.  Prior to admission, the patient denies chest pain, shortness of breath, dizziness, palpitations, or syncope.  He is recovering from his stroke with plans to return home  at discharge.  Allergies, Past Medical, Surgical,  Social, and Family Histories have been reviewed and are referenced here-in when relevant for medical decision making.   Inpatient Medications:   aspirin EC  81 mg Oral Daily   atorvastatin  40 mg Oral QHS   clopidogrel  75 mg Oral Daily   enoxaparin (LOVENOX) injection  40 mg Subcutaneous Q24H   insulin aspart  0-24 Units Subcutaneous Q4H   insulin detemir  20 Units Subcutaneous Daily   losartan  50 mg Oral Daily   [START ON 07/19/2023] NIFEdipine  90 mg Oral Daily   potassium chloride  40 mEq Oral Once    Physical Exam: Vitals:   07/18/23 0014 07/18/23 0051 07/18/23 0300 07/18/23 0734  BP: (!) 184/98 (!) 157/96 (!) 170/91 (!) 163/97  Pulse:   67 67  Resp: 16  16 18   Temp: 98.6 F (37 C)  97.9 F (36.6 C) 98.3 F (36.8 C)  TempSrc: Oral  Oral Oral  SpO2:   94% 98%  Weight: 99.5 kg     Height:        GEN- NAD. A&O x 3. Normal affect. HEENT: Normocephalic, atraumatic Lungs- CTAB, Normal effort.  Heart- Regular rate and rhythm rate and rhythm. No M/G/R.  Extremities- No peripheral edema. no clubbing or cyanosis Skin- warm and dry, no rash or lesion. Neuro - No focal deficit   12-lead ECG on arrival showed NSR at 95 bpm (personally reviewed) All prior EKG's in EPIC reviewed with no documented atrial fibrillation  Telemetry NSR 60s (personally reviewed)  Assessment and Plan:  1. Cryptogenic stroke The patient presents with cryptogenic stroke.  The patient does not have a TEE planned for this AM.  I spoke at length with the patient about monitoring for afib with an implantable loop recorder.  Risks, benefits, and alteratives to implantable loop recorder were discussed with the patient today.   At this time, the patient is very clear in their decision to proceed with implantable loop recorder.    Wound care was reviewed with the patient (keep incision clean and dry for 3 days). Please call with questions.   Graciella Freer, PA-C 07/18/2023 1:08 PM

## 2023-07-18 NOTE — Inpatient Diabetes Management (Signed)
Inpatient Diabetes Program Recommendations  AACE/ADA: New Consensus Statement on Inpatient Glycemic Control (2015)  Target Ranges:  Prepandial:   less than 140 mg/dL      Peak postprandial:   less than 180 mg/dL (1-2 hours)      Critically ill patients:  140 - 180 mg/dL   Lab Results  Component Value Date   GLUCAP 303 (H) 07/18/2023   HGBA1C 8.1 (H) 12/16/2022    Review of Glycemic Control   Spoke with pt at bedside regarding the use of insulin pen. Demonstrated use of insulin pen. Pt is already familiar with the operation as he has been on Trulicity in the past.   Discharge Recommendations: Other recommendations: continue home meds: metformin 850 mg bid, Glipizide 10 mg bid Long acting recommendations: Insulin Glargine (LANTUS) Solostar Pen 25 units Daily  Supply/Referral recommendations: Pen needles - long (BMI >40)  Thanks,  Christena Deem RN, MSN, BC-ADM Inpatient Diabetes Coordinator Team Pager [phone] (8a-5p)

## 2023-07-18 NOTE — Progress Notes (Addendum)
STROKE TEAM PROGRESS NOTE   BRIEF HPI Mr. Tre Barcellona is a 57 y.o. male with history of hypertension, hyperlipidemia and diabetes presenting with chest pain and transient aphasia.  Patient noticed he had 2 episodes where he was unable to speak normally.  He understood what he wanted to say was but was unable to get the words out correctly.  Each of these episodes resolved in about 5 minutes.  MRI was negative for acute infarct.  He was noted to be hypertensive upon arrival to the ED, and this was treated with labetalol.  He was found to have a non-STEMI and AKI.  INTERIM HISTORY/SUBJECTIVE Patient has been hemodynamically stable with blood pressures on the high end of normal.  He reports he is doing well and will likely be discharged today.   OBJECTIVE  CBC    Component Value Date/Time   WBC 6.6 07/18/2023 0219   RBC 4.74 07/18/2023 0219   HGB 14.0 07/18/2023 0219   HGB 14.1 03/09/2019 1143   HCT 38.9 (L) 07/18/2023 0219   HCT 40.5 03/09/2019 1143   PLT 164 07/18/2023 0219   PLT 184 03/09/2019 1143   MCV 82.1 07/18/2023 0219   MCV 87 03/09/2019 1143   MCH 29.5 07/18/2023 0219   MCHC 36.0 07/18/2023 0219   RDW 13.2 07/18/2023 0219   RDW 13.7 03/09/2019 1143   LYMPHSABS 2.5 07/18/2023 0219   MONOABS 0.7 07/18/2023 0219   EOSABS 0.2 07/18/2023 0219   BASOSABS 0.0 07/18/2023 0219    BMET    Component Value Date/Time   NA 138 07/18/2023 0219   NA 137 02/01/2021 1711   K 3.1 (L) 07/18/2023 1134   CL 102 07/18/2023 0219   CO2 26 07/18/2023 0219   GLUCOSE 141 (H) 07/18/2023 0219   BUN 12 07/18/2023 0219   BUN 24 02/01/2021 1711   CREATININE 0.97 07/18/2023 0219   CREATININE 2.11 (H) 08/12/2021 1250   CALCIUM 9.2 07/18/2023 0219   EGFR 36 (L) 08/12/2021 1250   GFRNONAA >60 07/18/2023 0219    IMAGING past 24 hours ECHOCARDIOGRAM COMPLETE  Result Date: 07/17/2023    ECHOCARDIOGRAM REPORT   Patient Name:   Dylan Abbott Date of Exam: 07/17/2023 Medical Rec #:   [id number]       Height:       72.0 in Accession #:    [id number]      Weight:       230.0 lb Date of Birth:  1966/05/06       BSA:          2.261 m Patient Age:    56 years        BP:           173/94 mmHg Patient Gender: M               HR:           65 bpm. Exam Location:  Inpatient Procedure: 2D Echo, Cardiac Doppler and Color Doppler Indications:    NSTEMI  History:        Patient has no prior history of Echocardiogram examinations.                 Risk Factors:Hypertension, Dyslipidemia and Diabetes.  Sonographer:    Milda Smart Referring Phys: [id number] ROBERT DORRELL  Sonographer Comments: Image acquisition challenging due to respiratory motion. IMPRESSIONS  1. Left ventricular ejection fraction, by estimation, is 55 to 60%. The left ventricle has normal function. The left ventricle has  no regional wall motion abnormalities. There is mild concentric left ventricular hypertrophy. Left ventricular diastolic parameters are consistent with Grade I diastolic dysfunction (impaired relaxation).  2. Right ventricular systolic function is normal. The right ventricular size is normal. Tricuspid regurgitation signal is inadequate for assessing PA pressure.  3. The mitral valve is normal in structure. No evidence of mitral valve regurgitation. No evidence of mitral stenosis.  4. The aortic valve is tricuspid. Aortic valve regurgitation is not visualized. No aortic stenosis is present.  5. There is mild dilatation of the ascending aorta, measuring 38 mm.  6. The inferior vena cava is normal in size with greater than 50% respiratory variability, suggesting right atrial pressure of 3 mmHg. FINDINGS  Left Ventricle: Left ventricular ejection fraction, by estimation, is 55 to 60%. The left ventricle has normal function. The left ventricle has no regional wall motion abnormalities. The left ventricular internal cavity size was normal in size. There is  mild concentric left ventricular hypertrophy. Left ventricular diastolic  parameters are consistent with Grade I diastolic dysfunction (impaired relaxation). Right Ventricle: The right ventricular size is normal. No increase in right ventricular wall thickness. Right ventricular systolic function is normal. Tricuspid regurgitation signal is inadequate for assessing PA pressure. Left Atrium: Left atrial size was normal in size. Right Atrium: Right atrial size was normal in size. Pericardium: There is no evidence of pericardial effusion. Mitral Valve: The mitral valve is normal in structure. No evidence of mitral valve regurgitation. No evidence of mitral valve stenosis. MV peak gradient, 3.8 mmHg. The mean mitral valve gradient is 2.0 mmHg. Tricuspid Valve: The tricuspid valve is normal in structure. Tricuspid valve regurgitation is trivial. Aortic Valve: The aortic valve is tricuspid. Aortic valve regurgitation is not visualized. No aortic stenosis is present. Pulmonic Valve: The pulmonic valve was normal in structure. Pulmonic valve regurgitation is not visualized. Aorta: The aortic root is normal in size and structure. There is mild dilatation of the ascending aorta, measuring 38 mm. Venous: The inferior vena cava is normal in size with greater than 50% respiratory variability, suggesting right atrial pressure of 3 mmHg. IAS/Shunts: No atrial level shunt detected by color flow Doppler.  LEFT VENTRICLE PLAX 2D LVIDd:         4.50 cm      Diastology LVIDs:         2.90 cm      LV e' medial:    4.24 cm/s LV PW:         1.30 cm      LV E/e' medial:  18.0 LV IVS:        1.30 cm      LV e' lateral:   5.22 cm/s LVOT diam:     2.30 cm      LV E/e' lateral: 14.7 LV SV:         120 LV SV Index:   53 LVOT Area:     4.15 cm  LV Volumes (MOD) LV vol d, MOD A2C: 92.9 ml LV vol d, MOD A4C: 100.0 ml LV vol s, MOD A2C: 31.8 ml LV vol s, MOD A4C: 35.3 ml LV SV MOD A2C:     61.1 ml LV SV MOD A4C:     100.0 ml LV SV MOD BP:      66.3 ml RIGHT VENTRICLE RV Basal diam:  3.60 cm RV S prime:     13.50 cm/s  TAPSE (M-mode): 2.2 cm LEFT ATRIUM  Index        RIGHT ATRIUM           Index LA diam:        4.30 cm 1.90 cm/m   RA Area:     17.80 cm LA Vol (A2C):   60.0 ml 26.54 ml/m  RA Volume:   47.20 ml  20.88 ml/m LA Vol (A4C):   55.1 ml 24.37 ml/m LA Biplane Vol: 57.8 ml 25.56 ml/m  AORTIC VALVE LVOT Vmax:   141.00 cm/s LVOT Vmean:  95.200 cm/s LVOT VTI:    0.290 m  AORTA Ao Root diam: 3.60 cm Ao Asc diam:  3.80 cm MITRAL VALVE MV Area (PHT): 3.17 cm    SHUNTS MV Area VTI:   3.59 cm    Systemic VTI:  0.29 m MV Peak grad:  3.8 mmHg    Systemic Diam: 2.30 cm MV Mean grad:  2.0 mmHg MV Vmax:       0.97 m/s MV Vmean:      68.6 cm/s MV Decel Time: 239 msec MV E velocity: 76.50 cm/s MV A velocity: 80.50 cm/s MV E/A ratio:  0.95 Dalton McleanMD Electronically signed by Wilfred Lacy Signature Date/Time: 07/17/2023/5:46:59 PM    Final     Vitals:   07/18/23 0051 07/18/23 0300 07/18/23 0734 07/18/23 1155  BP: (!) 157/96 (!) 170/91 (!) 163/97 (!) 189/98  Pulse:  67 67 67  Resp:  16 18 18   Temp:  97.9 F (36.6 C) 98.3 F (36.8 C) 98.3 F (36.8 C)  TempSrc:  Oral Oral Oral  SpO2:  94% 98% 95%  Weight:      Height:         PHYSICAL EXAM General:  Alert, well-nourished, well-developed pleasant middle-aged African-American male in no acute distress Psych:  Mood and affect appropriate for situation CV: Regular rate and rhythm on monitor Respiratory:  Regular, unlabored respirations on room air   NEURO:  Mental Status: AA&Ox3, patient is able to give clear and coherent history Speech/Language: speech is without dysarthria or aphasia.    Cranial Nerves:  II: PERRL. Visual fields full.  III, IV, VI: EOMI. Eyelids elevate symmetrically.  V: Sensation is intact to light touch and symmetrical to face.  VII: Face is symmetrical resting and smiling VIII: hearing intact to voice. IX, X: Phonation is normal.  WJ:[id number] shrug 5/5. XII: tongue is midline without fasciculations. Motor: 5/5  strength to all muscle groups tested.  Tone: is normal and bulk is normal Sensation- Intact to light touch bilaterally.  Coordination: FTN intact bilaterally, HKS: no ataxia in BLE.No drift.  Gait- deferred   ASSESSMENT/PLAN  TIA: Left-hemispheric TIA presenting with 2 transient episodes of expressive aphasia CT head No acute abnormality.  CTA head & neck no LVO or hemodynamically significant stenosis MRI no acute abnormality, chronic small vessel ischemic changes 2D Echo EF 55 to 60%, mild concentric LVH, grade 1 diastolic dysfunction, normal left atrial size, no atrial level shunt Patient will need loop recorder given young age and concern for atrial fibrillation Also will consider TEE LDL 87 HgbA1c 8.1 VTE prophylaxis -Lovenox No antithrombotic prior to admission, now on aspirin 81 mg daily and clopidogrel 75 mg daily for 3 weeks and then aspirin alone. Therapy recommendations:  No follow up needed Disposition: Home  Hypertension Home meds: Nicardipine 60 mg XL daily, losartan 25 mg daily Stable Maintain normotension  Hyperlipidemia Home meds: Simvastatin 80 mg daily, changed to atorvastatin 80 mg daily LDL 87, goal <  70 Add ezetimibe 10 mg daily Continue statin at discharge  Diabetes type II Uncontrolled Home meds: Dulaglutide 1.5 mg weekly, metformin 850 mg twice daily, glipizide 10 mg twice daily HgbA1c 8.1, goal < 7.0 CBGs SSI Recommend close follow-up with PCP for better DM control  Non-STEMI Patient experienced chest pressure upon admission Likely etiology is demand ischemia from uncontrolled hypertension Troponin mildly elevated to peak of 43 Continue Plavix  Other Stroke Risk Factors Obesity, Body mass index is 35.41 kg/m., BMI >/= 30 associated with increased stroke risk, recommend weight loss, diet and exercise as appropriate    Other Active Problems AKI-creatinine recovering nicely  Hospital day # 2 Patient seen by NP and then by MD, MD to edit  note is needed Cortney E Ernestina Columbia , MSN, AGACNP-BC [facility] See Amion for schedule and pager information 07/18/2023 1:30 PM   STROKE MD NOTE :  I have personally obtained history,examined this patient, reviewed notes, independently viewed imaging studies, participated in medical decision making and plan of care.ROS completed by me personally and pertinent positives fully documented  I have made any additions or clarifications directly to the above note. Agree with note above.  He presented with 2 transient episodes of expressive aphasia and neurovascular imaging is unremarkable.  Recommend aspirin and Plavix for 3 weeks followed by aspirin alone and aggressive risk factor modification. Recommend follow on cardiac monitoring with loop recorder for paroxysmal A-fib at discharge.  Follow-up with an outpatient stroke clinic in 2 months.  Long discussion with patient and answered questions.  Discussed with Dr. Renford Dills.  Greater than 50% time during this 50-minute visit was spent in counseling and coordination of care about his TIA and discussion about evaluation and treatment plan and answering questions. Delia Heady, MD Medical Director [facility] Pager: [phone] 07/18/2023 4:51 PM    To contact Stroke Continuity provider, please refer to [email or web address]. After hours, contact General Neurology

## 2023-07-18 NOTE — Progress Notes (Signed)
Mobility Specialist Progress Note:    07/18/23 1039  Mobility  Activity Ambulated independently in hallway  Level of Assistance Modified independent, requires aide device or extra time  Assistive Device None  Distance Ambulated (ft) 350 ft  Activity Response Tolerated well  Mobility Referral Yes  $Mobility charge 1 Mobility  Mobility Specialist Start Time (ACUTE ONLY) 1010  Mobility Specialist Stop Time (ACUTE ONLY) 1019  Mobility Specialist Time Calculation (min) (ACUTE ONLY) 9 min   Received pt supine in bed having no complaints and agreeable to mobility. Pt was asymptomatic throughout ambulation and returned to room w/o fault. Left sitting EOB w/ call bell in reach and all needs met.   Thompson Grayer Mobility Specialist  Please contact vis Secure Chat or  Rehab Office [phone]

## 2023-07-18 NOTE — Evaluation (Signed)
Occupational Therapy Evaluation Patient Details Name: Dylan Abbott MRN: [id number] DOB: May 31, 1966 Today's Date: 07/18/2023   History of Present Illness Pt is a 57 y/o male admitted 7/28 with chest pain and speech changes.  MRI showed no acute neuro abnormality.  NSTEMI--Troponin   Clinical Impression   Pt is at Ind baseline level of function with ADLs and mobility, no ADs or DME required for mobility or safety. Pt lives at home and was Ind with ADLs, IADLs and driving PTA. All education completed and no further acute OT services are indicated at this time. OT will sign off     Recommendations for follow up therapy are one component of a multi-disciplinary discharge planning process, led by the attending physician.  Recommendations may be updated based on patient status, additional functional criteria and insurance authorization.   Assistance Recommended at Discharge None  Patient can return home with the following      Functional Status Assessment  Patient has not had a recent decline in their functional status  Equipment Recommendations  None recommended by OT    Recommendations for Other Services       Precautions / Restrictions Precautions Precautions: None Restrictions Weight Bearing Restrictions: No      Mobility Bed Mobility Overal bed mobility: Independent                  Transfers Overall transfer level: Independent                        Balance Overall balance assessment: No apparent balance deficits (not formally assessed)                                         ADL either performed or assessed with clinical judgement   ADL Overall ADL's : Independent;At baseline                                             Vision Ability to See in Adequate Light: 0 Adequate Patient Visual Report: No change from baseline       Perception     Praxis      Pertinent Vitals/Pain Pain Assessment Pain  Assessment: No/denies pain     Hand Dominance Right   Extremity/Trunk Assessment Upper Extremity Assessment Upper Extremity Assessment: Overall WFL for tasks assessed   Lower Extremity Assessment Lower Extremity Assessment: Defer to PT evaluation   Cervical / Trunk Assessment Cervical / Trunk Assessment: Normal   Communication Communication Communication: No difficulties   Cognition Arousal/Alertness: Awake/alert   Overall Cognitive Status: Within Functional Limits for tasks assessed                                       General Comments       Exercises     Shoulder Instructions      Home Living Family/patient expects to be discharged to:: Private residence Living Arrangements: Alone Available Help at Discharge: Friend(s) Type of Home: Apartment Home Access: Stairs to enter [employer] of Steps: flight Entrance Stairs-Rails: Right;Left Home Layout: One level     Bathroom Shower/Tub: Chief Strategy Officer: Standard Bathroom Accessibility: Yes  Home Equipment: None          Prior Functioning/Environment               Mobility Comments: Independent and drives ADLs Comments: Independent        OT Problem List: Decreased activity tolerance      OT Treatment/Interventions:      OT Goals(Current goals can be found in the care plan section) Acute Rehab OT Goals Patient Stated Goal: go home  OT Frequency:      Co-evaluation              AM-PAC OT "6 Clicks" Daily Activity     Outcome Measure Help from another person eating meals?: None Help from another person taking care of personal grooming?: None Help from another person toileting, which includes using toliet, bedpan, or urinal?: None Help from another person bathing (including washing, rinsing, drying)?: None Help from another person to put on and taking off regular upper body clothing?: None Help from another person to put on and taking off  regular lower body clothing?: None 6 Click Score: 24   End of Session    Activity Tolerance: Patient tolerated treatment well Patient left: in bed  OT Visit Diagnosis: Other abnormalities of gait and mobility (R26.89)                Time: 1610-9604 OT Time Calculation (min): 21 min Charges:  OT General Charges $OT Visit: 1 Visit OT Evaluation $OT Eval Low Complexity: 1 Low    Galen Manila 07/18/2023, 10:54 AM

## 2023-07-18 NOTE — TOC Initial Note (Signed)
Transition of Care [facility]) - Initial/Assessment Note    Patient Details  Name: Dylan Abbott MRN: [id number] Date of Birth: 1966-11-22  Transition of Care [facility]) CM/SW Contact:    Leone Haven, RN Phone Number: 07/18/2023, 3:15 PM  Clinical Narrative:                 From home alone, he has PCP and insurance on file, he states he does not have any HH services currently or DME at home.  He states he will need transportation ast to his car at [facility].  He has no family support, pta self ambulatory. TOC to fill medications.  Expected Discharge Plan: Home/Self Care Barriers to Discharge: No Barriers Identified   Patient Goals and CMS Choice Patient states their goals for this hospitalization and ongoing recovery are:: return home   Choice offered to / list presented to : NA      Expected Discharge Plan and Services In-house Referral: NA   Post Acute Care Choice: NA Living arrangements for the past 2 months: Single Family Home Expected Discharge Date: 07/18/23                 DME Agency: NA       HH Arranged: NA          Prior Living Arrangements/Services Living arrangements for the past 2 months: Single Family Home Lives with:: Self Patient language and need for interpreter reviewed:: Yes Do you feel safe going back to the place where you live?: Yes      Need for Family Participation in Patient Care: No (Comment) Care giver support system in place?: No (comment)   Criminal Activity/Legal Involvement Pertinent to Current Situation/Hospitalization: No - Comment as needed  Activities of Daily Living Home Assistive Devices/Equipment: None ADL Screening (condition at time of admission) Patient's cognitive ability adequate to safely complete daily activities?: Yes Is the patient deaf or have difficulty hearing?: No Does the patient have difficulty seeing, even when wearing glasses/contacts?: No Does the patient have difficulty concentrating,  remembering, or making decisions?: No Patient able to express need for assistance with ADLs?: Yes Does the patient have difficulty dressing or bathing?: No Independently performs ADLs?: Yes (appropriate for developmental age) Does the patient have difficulty walking or climbing stairs?: No Weakness of Legs: None Weakness of Arms/Hands: None  Permission Sought/Granted                  Emotional Assessment   Attitude/Demeanor/Rapport: Engaged Affect (typically observed): Appropriate Orientation: : Oriented to Self, Oriented to Place, Oriented to  Time, Oriented to Situation Alcohol / Substance Use: Not Applicable Psych Involvement: No (comment)  Admission diagnosis:  Elevated troponin [R79.89] NSTEMI (non-ST elevated myocardial infarction) (HCC) [I21.4] Hypertensive emergency [I16.1] Nonspecific chest pain [R07.9] Transient speech disturbance [R47.9] Patient Active Problem List   Diagnosis Date Noted   NSTEMI (non-ST elevated myocardial infarction) (HCC) 07/16/2023   Impingement syndrome of left shoulder 07/30/2020   COVID-19 virus infection 10/31/2019   Abnormal liver function 10/31/2019   Hypokalemia 10/31/2019   Hyponatremia 10/31/2019   Diabetes mellitus (HCC) 11/13/2017   Hyperlipidemia 11/13/2017   Hypertensive disorder 11/13/2017   PCP:  Shade Flood, MD Pharmacy:   [id number] [place], [place] - [address] AT [facility] [address] [place] [address] Phone: [phone] Fax: [phone]  [facility] [address] [place] [address] Phone: [phone]  Fax: [phone]     Social Determinants of Health (SDOH) Social History: SDOH Screenings   Food Insecurity: No Food Insecurity (07/17/2023)  Housing: Low Risk  (07/17/2023)  Transportation Needs: No Transportation Needs (07/17/2023)  Utilities: Not At Risk (07/17/2023)  Depression (PHQ2-9): Low Risk   (12/16/2022)  Tobacco Use: Low Risk  (07/16/2023)   SDOH Interventions:     Readmission Risk Interventions     No data to display

## 2023-07-18 NOTE — Discharge Summary (Signed)
Physician Discharge Summary  [name] NUU:[id number] DOB: 21-Apr-1966 DOA: 07/16/2023  PCP: Shade Flood, MD  Admit date: 07/16/2023 Discharge date: 07/18/2023  Admitted From: Home Disposition:  Home  Discharge Condition:Stable CODE STATUS:FULL Diet recommendation:Carb Modified   Brief/Interim Summary: 57 y.o. male with medical history significant of hypertension, hyperlipidemia who presents emergency department with chest pain, speech changes.  Patient's been having several hours of substernal chest pressure, difficulty talking.  He presented to the emergency department where he was found to be profoundly hypertensive with systolics in the 200s.  CT angio head and neck which showed no abnormalities. Chest x-ray showed no active disease. Patient was admitted due to elevated troponin and need for neurologic evaluation.  Stroke workup initiated and completed.  MRI did not show any acute findings.  Patient noticed to have uncontrolled diabetes type 2, A1c pending.  He was also noticed to have severe hypertension.  Suspect noncompliance.  His antihypertensives have been adjusted, has also been started on insulin after diabetic coordinator consultation.  Medically stable for discharge home today.  Following problems were addressed during the hospitalization:  TIA/ Left sided weakness and Speech abnormalities:  MRI brain without contrast showed Mild to moderate for age cerebral white matter signal changes, with  chronic small vessel disease.  No acute findings. CT angio of the head and neck showed was normal . LDL found to be 87.  A1c is pending. Neurology signed off.Neurology recommended aspirin and plavix for 3 weeks and continue aspirin. He does not have any focal deficits.  His speech is clear.PT/OT signed off, no follow-up recommended.  Since there was concern for TIA, neurology recommended loop recorder placement to rule out occult , which will be  placed today by EP   Hypertensive  Emergency Severely hypertensive on presentation.  Currently blood pressure stable after restarting on home medications.  Dose of losartan, nifedipine increased.  Suspect noncompliance.  Patient has been advised to monitor his blood pressure at home     Elevated troponin: Most likely from demand ischemia.  Echo was done which showed EF of 55 to 60%, grade 1 diastolic dysfunction, no wall motion abnormality.  Patient denies any chest pain.      AKI  Resolved.    Uncontrolled type 2 DM:  Non insulin dependent  He is last A1c was 8.1 as per 12/16/2022.  Takes glipizide and metformin at home.  Was given insulin here.  New A1c pending.  Diabetic coordinator consulted.  Recommended to add Lantus 25 units daily and continue previous home medications.  Hypokalemia/hypomagnesemia:  Supplement with potassium and magnesium     Hyperlipidemia:  Started lipitor, added Zetia  Morbid obesity: BMI of 35.4.  Patient should get enrolled into healthy diet pattern and regular exercise     Discharge Diagnoses:  Principal Problem:   NSTEMI (non-ST elevated myocardial infarction) [facility])    Discharge Instructions  Discharge Instructions     Ambulatory referral to Neurology   Complete by: As directed    Follow up in stroke clinic at [facility] with Ihor Austin, NP in about 4 weeks. If not available, consider Dr. Delia Heady, Dr. Jamelle Rushing or Dr. Naomie Dean.   Diet Carb Modified   Complete by: As directed    Discharge instructions   Complete by: As directed    1)Please take prescribed medications as instructed 2)Follow up with your PCP in a week.  Follow-up with neurology as an outpatient in 4 weeks.  Name and number of  the provider group has been addressed 3)Monitor your blood sugars and blood pressure at home   Increase activity slowly   Complete by: As directed       Allergies as of 07/18/2023   No Known Allergies      Medication List     STOP taking  these medications    simvastatin 80 MG tablet Commonly known as: ZOCOR Replaced by: atorvastatin 40 MG tablet   Trulicity 1.5 MG/0.5ML Sopn Generic drug: Dulaglutide       TAKE these medications    Accu-Chek FastClix Lancets Misc USE TO CHECK BLOOD SUGAR EVERY DAY   Accu-Chek Guide test strip Generic drug: glucose blood USE TO CHECK BLOOD SUGAR twice per  DAY Dispense with meter of choice.   aspirin EC 81 MG tablet Take 1 tablet (81 mg total) by mouth daily. Swallow whole. Start taking on: July 19, 2023   atorvastatin 40 MG tablet Commonly known as: LIPITOR Take 1 tablet (40 mg total) by mouth at bedtime. Replaces: simvastatin 80 MG tablet   blood glucose meter kit and supplies Check once per day.   clopidogrel 75 MG tablet Commonly known as: PLAVIX Take 1 tablet (75 mg total) by mouth daily for 19 days. Start taking on: July 19, 2023   ezetimibe 10 MG tablet Commonly known as: ZETIA Take 1 tablet (10 mg total) by mouth daily. Start taking on: July 19, 2023   glipiZIDE 10 MG tablet Commonly known as: GLUCOTROL Take 1 tablet (10 mg total) by mouth 2 (two) times daily before a meal.   insulin glargine 100 UNIT/ML Solostar Pen Commonly known as: LANTUS Inject 25 Units into the skin daily.   losartan 100 MG tablet Commonly known as: COZAAR Take 1 tablet (100 mg total) by mouth daily. Start taking on: July 19, 2023 What changed:  medication strength See the new instructions.   metFORMIN 850 MG tablet Commonly known as: GLUCOPHAGE Take 1 tablet (850 mg total) by mouth 2 (two) times daily with a meal.   NIFEdipine 90 MG 24 hr tablet Commonly known as: PROCARDIA XL/NIFEDICAL-XL Take 1 tablet (90 mg total) by mouth daily. Start taking on: July 19, 2023 What changed:  medication strength how much to take   Pen Needles 32G X 4 MM Misc 1 application by Does not apply route daily.   potassium chloride 10 MEQ tablet Commonly known as: KLOR-CON TAKE 2  TABLETS(20 MEQ) BY MOUTH DAILY What changed: See the new instructions.        Follow-up Information     Shade Flood, MD Follow up.   Specialties: Family Medicine, Sports Medicine Contact information: [address] [place] [address] [phone]         [facility]. Schedule an appointment as soon as possible for a visit in 4 week(s).   Specialty: Neurology Contact information: [address] [place] [address] [phone]               No Known Allergies  Consultations: Neurology   Procedures/Studies: ECHOCARDIOGRAM COMPLETE  Result Date: 07/17/2023    ECHOCARDIOGRAM REPORT   Patient Name:   OSCER LESSARD Date of Exam: 07/17/2023 Medical Rec #:  [id number]       Height:       72.0 in Accession #:    [id number]      Weight:       230.0 lb Date of Birth:  09-07-1966  BSA:          2.261 m Patient Age:    56 years        BP:           173/94 mmHg Patient Gender: M               HR:           65 bpm. Exam Location:  Inpatient Procedure: 2D Echo, Cardiac Doppler and Color Doppler Indications:    NSTEMI  History:        Patient has no prior history of Echocardiogram examinations.                 Risk Factors:Hypertension, Dyslipidemia and Diabetes.  Sonographer:    Milda Smart Referring Phys: [id number] ROBERT DORRELL  Sonographer Comments: Image acquisition challenging due to respiratory motion. IMPRESSIONS  1. Left ventricular ejection fraction, by estimation, is 55 to 60%. The left ventricle has normal function. The left ventricle has no regional wall motion abnormalities. There is mild concentric left ventricular hypertrophy. Left ventricular diastolic parameters are consistent with Grade I diastolic dysfunction (impaired relaxation).  2. Right ventricular systolic function is normal. The right ventricular size is normal. Tricuspid regurgitation signal is inadequate for assessing PA pressure.  3. The  mitral valve is normal in structure. No evidence of mitral valve regurgitation. No evidence of mitral stenosis.  4. The aortic valve is tricuspid. Aortic valve regurgitation is not visualized. No aortic stenosis is present.  5. There is mild dilatation of the ascending aorta, measuring 38 mm.  6. The inferior vena cava is normal in size with greater than 50% respiratory variability, suggesting right atrial pressure of 3 mmHg. FINDINGS  Left Ventricle: Left ventricular ejection fraction, by estimation, is 55 to 60%. The left ventricle has normal function. The left ventricle has no regional wall motion abnormalities. The left ventricular internal cavity size was normal in size. There is  mild concentric left ventricular hypertrophy. Left ventricular diastolic parameters are consistent with Grade I diastolic dysfunction (impaired relaxation). Right Ventricle: The right ventricular size is normal. No increase in right ventricular wall thickness. Right ventricular systolic function is normal. Tricuspid regurgitation signal is inadequate for assessing PA pressure. Left Atrium: Left atrial size was normal in size. Right Atrium: Right atrial size was normal in size. Pericardium: There is no evidence of pericardial effusion. Mitral Valve: The mitral valve is normal in structure. No evidence of mitral valve regurgitation. No evidence of mitral valve stenosis. MV peak gradient, 3.8 mmHg. The mean mitral valve gradient is 2.0 mmHg. Tricuspid Valve: The tricuspid valve is normal in structure. Tricuspid valve regurgitation is trivial. Aortic Valve: The aortic valve is tricuspid. Aortic valve regurgitation is not visualized. No aortic stenosis is present. Pulmonic Valve: The pulmonic valve was normal in structure. Pulmonic valve regurgitation is not visualized. Aorta: The aortic root is normal in size and structure. There is mild dilatation of the ascending aorta, measuring 38 mm. Venous: The inferior vena cava is normal in size  with greater than 50% respiratory variability, suggesting right atrial pressure of 3 mmHg. IAS/Shunts: No atrial level shunt detected by color flow Doppler.  LEFT VENTRICLE PLAX 2D LVIDd:         4.50 cm      Diastology LVIDs:         2.90 cm      LV e' medial:    4.24 cm/s LV PW:         1.30  cm      LV E/e' medial:  18.0 LV IVS:        1.30 cm      LV e' lateral:   5.22 cm/s LVOT diam:     2.30 cm      LV E/e' lateral: 14.7 LV SV:         120 LV SV Index:   53 LVOT Area:     4.15 cm  LV Volumes (MOD) LV vol d, MOD A2C: 92.9 ml LV vol d, MOD A4C: 100.0 ml LV vol s, MOD A2C: 31.8 ml LV vol s, MOD A4C: 35.3 ml LV SV MOD A2C:     61.1 ml LV SV MOD A4C:     100.0 ml LV SV MOD BP:      66.3 ml RIGHT VENTRICLE RV Basal diam:  3.60 cm RV S prime:     13.50 cm/s TAPSE (M-mode): 2.2 cm LEFT ATRIUM             Index        RIGHT ATRIUM           Index LA diam:        4.30 cm 1.90 cm/m   RA Area:     17.80 cm LA Vol (A2C):   60.0 ml 26.54 ml/m  RA Volume:   47.20 ml  20.88 ml/m LA Vol (A4C):   55.1 ml 24.37 ml/m LA Biplane Vol: 57.8 ml 25.56 ml/m  AORTIC VALVE LVOT Vmax:   141.00 cm/s LVOT Vmean:  95.200 cm/s LVOT VTI:    0.290 m  AORTA Ao Root diam: 3.60 cm Ao Asc diam:  3.80 cm MITRAL VALVE MV Area (PHT): 3.17 cm    SHUNTS MV Area VTI:   3.59 cm    Systemic VTI:  0.29 m MV Peak grad:  3.8 mmHg    Systemic Diam: 2.30 cm MV Mean grad:  2.0 mmHg MV Vmax:       0.97 m/s MV Vmean:      68.6 cm/s MV Decel Time: 239 msec MV E velocity: 76.50 cm/s MV A velocity: 80.50 cm/s MV E/A ratio:  0.95 Dalton McleanMD Electronically signed by Wilfred Lacy Signature Date/Time: 07/17/2023/5:46:59 PM    Final    MR BRAIN WO CONTRAST  Result Date: 07/17/2023 CLINICAL DATA:  57 year old male with chest pain, hypertension, neurologic deficit. EXAM: MRI HEAD WITHOUT CONTRAST TECHNIQUE: Multiplanar, multiecho pulse sequences of the brain and surrounding structures were obtained without intravenous contrast. COMPARISON:  CT head, CTA  head and neck yesterday. FINDINGS: Brain: No restricted diffusion to suggest acute infarction. No midline shift, mass effect, evidence of mass lesion, ventriculomegaly, extra-axial collection or acute intracranial hemorrhage. Cervicomedullary junction and pituitary are within normal limits. Scattered generally small cerebral white matter T2 and FLAIR hyperintensity in both hemispheres is mild to moderate for age in a nonspecific configuration. No cortical encephalomalacia identified. No chronic cerebral blood products identified. Deep gray nuclei, brainstem and cerebellum are within normal limits. Vascular: Major intracranial vascular flow voids are preserved. Skull and upper cervical spine: Negative. Visualized bone marrow signal is within normal limits. Sinuses/Orbits: Negative orbits. Left maxillary sinus mucosal thickening or retention cyst. Other: Mastoids are well aerated. Visible internal auditory structures appear normal. Negative visible scalp and face. IMPRESSION: 1. No acute intracranial abnormality. 2. Mild to moderate for age cerebral white matter signal changes, nonspecific but most commonly due to chronic small vessel disease. Electronically Signed   By: Althea Grimmer.D.  On: 07/17/2023 07:22   CT ANGIO HEAD NECK W WO CM  Result Date: 07/16/2023 CLINICAL DATA:  Chest pain and hypertension EXAM: CT ANGIOGRAPHY HEAD AND NECK WITH AND WITHOUT CONTRAST TECHNIQUE: Multidetector CT imaging of the head and neck was performed using the standard protocol during bolus administration of intravenous contrast. Multiplanar CT image reconstructions and MIPs were obtained to evaluate the vascular anatomy. Carotid stenosis measurements (when applicable) are obtained utilizing NASCET criteria, using the distal internal carotid diameter as the denominator. RADIATION DOSE REDUCTION: This exam was performed according to the departmental dose-optimization program which includes automated exposure control, adjustment of  the mA and/or kV according to patient size and/or use of iterative reconstruction technique. CONTRAST:  80mL OMNIPAQUE IOHEXOL 350 MG/ML SOLN COMPARISON:  None Available. FINDINGS: CT HEAD FINDINGS Brain: There is no mass, hemorrhage or extra-axial collection. The size and configuration of the ventricles and extra-axial CSF spaces are normal. There is no acute or chronic infarction. The brain parenchyma is normal. Skull: The visualized skull base, calvarium and extracranial soft tissues are normal. Sinuses/Orbits: No fluid levels or advanced mucosal thickening of the visualized paranasal sinuses. No mastoid or middle ear effusion. The orbits are normal. CTA NECK FINDINGS SKELETON: There is no bony spinal canal stenosis. No lytic or blastic lesion. OTHER NECK: Normal pharynx, larynx and major salivary glands. No cervical lymphadenopathy. Unremarkable thyroid gland. UPPER CHEST: No pneumothorax or pleural effusion. No nodules or masses. AORTIC ARCH: There is no calcific atherosclerosis of the aortic arch. Conventional 3 vessel aortic branching pattern. RIGHT CAROTID SYSTEM: Normal without aneurysm, dissection or stenosis. LEFT CAROTID SYSTEM: Normal without aneurysm, dissection or stenosis. VERTEBRAL ARTERIES: Left dominant configuration.There is no dissection, occlusion or flow-limiting stenosis to the skull base (V1-V3 segments). CTA HEAD FINDINGS POSTERIOR CIRCULATION: --Vertebral arteries: Normal V4 segments. --Inferior cerebellar arteries: Normal. --Basilar artery: Normal. --Superior cerebellar arteries: Normal. --Posterior cerebral arteries (PCA): Normal. ANTERIOR CIRCULATION: --Intracranial internal carotid arteries: Normal. --Anterior cerebral arteries (ACA): Normal. Both A1 segments are present. Patent anterior communicating artery (a-comm). --Middle cerebral arteries (MCA): Normal. VENOUS SINUSES: As permitted by contrast timing, patent. ANATOMIC VARIANTS: None Review of the MIP images confirms the above  findings. IMPRESSION: Normal CTA of the head and neck. Electronically Signed   By: Deatra Robinson M.D.   On: 07/16/2023 22:16   DG Chest 2 View  Result Date: 07/16/2023 CLINICAL DATA:  Chest pain. EXAM: CHEST - 2 VIEW COMPARISON:  October 30, 2019. FINDINGS: The heart size and mediastinal contours are within normal limits. Both lungs are clear. The visualized skeletal structures are unremarkable. IMPRESSION: No active cardiopulmonary disease. Electronically Signed   By: Lupita Raider M.D.   On: 07/16/2023 20:33      Subjective: Patient seen and examined at the bedside today.  Hemodynamically stable.  Remains comfortable.  Alert and oriented.  No focal neurological deficits, no numbness or weakness, speech clear.  Patient is eager to go home today.  Medically stable for discharge after loop recorder placement  Discharge Exam: Vitals:   07/18/23 0734 07/18/23 1155  BP: (!) 163/97 (!) 189/98  Pulse: 67 67  Resp: 18 18  Temp: 98.3 F (36.8 C) 98.3 F (36.8 C)  SpO2: 98% 95%   Vitals:   07/18/23 0051 07/18/23 0300 07/18/23 0734 07/18/23 1155  BP: (!) 157/96 (!) 170/91 (!) 163/97 (!) 189/98  Pulse:  67 67 67  Resp:  16 18 18   Temp:  97.9 F (36.6 C) 98.3 F (36.8 C) 98.3  F (36.8 C)  TempSrc:  Oral Oral Oral  SpO2:  94% 98% 95%  Weight:      Height:        General: Pt is alert, awake, not in acute distress, morbidly obese Cardiovascular: RRR, S1/S2 +, no rubs, no gallops Respiratory: CTA bilaterally, no wheezing, no rhonchi Abdominal: Soft, NT, ND, bowel sounds + Extremities: no edema, no cyanosis    The results of significant diagnostics from this hospitalization (including imaging, microbiology, ancillary and laboratory) are listed below for reference.     Microbiology: No results found for this or any previous visit (from the past 240 hour(s)).   Labs: BNP (last 3 results) No results for input(s): "BNP" in the last 8760 hours. Basic Metabolic Panel: Recent Labs   Lab 07/16/23 2020 07/16/23 2216 07/17/23 0539 07/17/23 1334 07/18/23 0219 07/18/23 1134  NA 130* 130* 136  --  138  --   K 3.3* 3.2* 3.1*  --  2.8* 3.1*  CL 93* 94* 99  --  102  --   CO2 25 27 27   --  26  --   GLUCOSE 527* 416* 224*  --  141*  --   BUN 19 20 16   --  12  --   CREATININE 1.48* 1.28* 0.95  --  0.97  --   CALCIUM 9.3 9.0 9.0  --  9.2  --   MG  --   --   --  1.7 1.6*  --    Liver Function Tests: Recent Labs  Lab 07/16/23 2216  AST 24  ALT 20  ALKPHOS 65  BILITOT 0.7  PROT 7.2  ALBUMIN 3.8   Recent Labs  Lab 07/16/23 2020  LIPASE 70*   No results for input(s): "AMMONIA" in the last 168 hours. CBC: Recent Labs  Lab 07/16/23 2020 07/16/23 2305 07/17/23 0539 07/18/23 0219  WBC 6.6 6.3 6.6 6.6  NEUTROABS  --  3.9  --  3.1  HGB 14.8 14.4 13.8 14.0  HCT 40.4 39.5 38.1* 38.9*  MCV 83.1 83.0 83.2 82.1  PLT 168 161 155 164   Cardiac Enzymes: No results for input(s): "CKTOTAL", "CKMB", "CKMBINDEX", "TROPONINI" in the last 168 hours. BNP: Invalid input(s): "POCBNP" CBG: Recent Labs  Lab 07/17/23 1953 07/18/23 0037 07/18/23 0404 07/18/23 0839 07/18/23 1118  GLUCAP 300* 152* 144* 193* 303*   D-Dimer No results for input(s): "DDIMER" in the last 72 hours. Hgb A1c No results for input(s): "HGBA1C" in the last 72 hours. Lipid Profile Recent Labs    07/18/23 0219  CHOL 152  HDL 55  LDLCALC 87  TRIG 49  CHOLHDL 2.8   Thyroid function studies Recent Labs    07/16/23 2216  TSH 1.630   Anemia work up No results for input(s): "VITAMINB12", "FOLATE", "FERRITIN", "TIBC", "IRON", "RETICCTPCT" in the last 72 hours. Urinalysis    Component Value Date/Time   COLORURINE STRAW (A) 07/16/2023 2303   APPEARANCEUR CLEAR 07/16/2023 2303   LABSPEC 1.043 (H) 07/16/2023 2303   PHURINE 7.0 07/16/2023 2303   GLUCOSEU >=500 (A) 07/16/2023 2303   HGBUR NEGATIVE 07/16/2023 2303   BILIRUBINUR NEGATIVE 07/16/2023 2303   BILIRUBINUR negative 08/31/2022  1623   KETONESUR NEGATIVE 07/16/2023 2303   PROTEINUR NEGATIVE 07/16/2023 2303   UROBILINOGEN 0.2 08/31/2022 1623   UROBILINOGEN 0.2 10/17/2009 0745   NITRITE NEGATIVE 07/16/2023 2303   LEUKOCYTESUR NEGATIVE 07/16/2023 2303   Sepsis Labs Recent Labs  Lab 07/16/23 2020 07/16/23 2305 07/17/23 0539 07/18/23 8119  WBC 6.6 6.3 6.6 6.6   Microbiology No results found for this or any previous visit (from the past 240 hour(s)).  Please note: You were cared for by a hospitalist during your hospital stay. Once you are discharged, your primary care physician will handle any further medical issues. Please note that NO REFILLS for any discharge medications will be authorized once you are discharged, as it is imperative that you return to your primary care physician (or establish a relationship with a primary care physician if you do not have one) for your post hospital discharge needs so that they can reassess your need for medications and monitor your lab values.    Time coordinating discharge: 40 minutes  SIGNED:   Burnadette Pop, MD  [facility] 07/18/2023, 3:12 PM Pager [phone]  If 7PM-7AM, please contact night-coverage [email or web address] Password [email or web address]

## 2023-07-18 NOTE — TOC Transition Note (Signed)
Transition of Care [facility]) - CM/SW Discharge Note   Patient Details  Name: Dylan Abbott MRN: [id number] Date of Birth: [birth year]  [facility] CM/SW Contact:  Leone Haven, RN Phone Number: 07/18/2023, 3:17 PM   Clinical Narrative:    For dc today, will need transport assistance to get to his car that is at [employer].  NCM will ast with cab voucher.    Final next level of care: Home/Self Care Barriers to Discharge: No Barriers Identified   Patient Goals and CMS Choice   Choice offered to / list presented to : NA  Discharge Placement                         Discharge Plan and Services Additional resources added to the After Visit Summary for   In-house Referral: NA   Post Acute Care Choice: NA            DME Agency: NA       HH Arranged: NA          Social Determinants of Health (SDOH) Interventions SDOH Screenings   Food Insecurity: No Food Insecurity (07/17/2023)  Housing: Low Risk  (07/17/2023)  Transportation Needs: No Transportation Needs (07/17/2023)  Utilities: Not At Risk (07/17/2023)  Depression (PHQ2-9): Low Risk  (12/16/2022)  Tobacco Use: Low Risk  (07/16/2023)     Readmission Risk Interventions     No data to display

## 2023-07-19 ENCOUNTER — Telehealth: Payer: Self-pay

## 2023-07-19 ENCOUNTER — Encounter (HOSPITAL_COMMUNITY): Payer: Self-pay | Admitting: Student

## 2023-07-19 NOTE — Transitions of Care (Post Inpatient/ED Visit) (Signed)
07/19/2023  Name: Dylan Abbott MRN: [id number] DOB: 1966/10/09  Today's TOC FU Call Status: Today's TOC FU Call Status:: Successful TOC FU Call Completed TOC FU Call Complete Date: 07/19/23  Transition Care Management Follow-up Telephone Call Date of Discharge: 07/18/23 Discharge Facility: [facility] Type of Discharge: Inpatient Admission Primary Inpatient Discharge Diagnosis:: NSTEMI How have you been since you were released from the hospital?: Better Any questions or concerns?: No  Items Reviewed: Did you receive and understand the discharge instructions provided?: Yes Medications obtained,verified, and reconciled?: Yes (Medications Reviewed) Any new allergies since your discharge?: No Dietary orders reviewed?: Yes Do you have support at home?: Yes  Medications Reviewed Today: Medications Reviewed Today     Reviewed by Merleen Nicely, LPN (Licensed Practical Nurse) on 07/19/23 at 1202  Med List Status: <None>   Medication Order Taking? Sig Documenting Provider Last Dose Status Informant  Accu-Chek FastClix Lancets MISC [id number] Yes USE TO CHECK BLOOD SUGAR EVERY DAY Shade Flood, MD Taking Active Self  aspirin EC 81 MG tablet [id number] Yes Take 1 tablet (81 mg total) by mouth daily for 30 days, then as directed by provider. Swallow whole. Burnadette Pop, MD Taking Active   atorvastatin (LIPITOR) 40 MG tablet [id number] Yes Take 1 tablet (40 mg total) by mouth at bedtime. Burnadette Pop, MD Taking Active   blood glucose meter kit and supplies [id number] Yes Check once per day. Shade Flood, MD Taking Active Self  clopidogrel (PLAVIX) 75 MG tablet [id number] Yes Take 1 tablet (75 mg total) by mouth daily for 19 days. Burnadette Pop, MD Taking Active   ezetimibe (ZETIA) 10 MG tablet [id number] Yes Take 1 tablet (10 mg total) by mouth daily. Burnadette Pop, MD Taking Active   glipiZIDE (GLUCOTROL) 10 MG tablet [id number] Yes Take 1 tablet (10 mg total) by mouth  2 (two) times daily before a meal. Shade Flood, MD Taking Active Self  glucose blood (ACCU-CHEK GUIDE) test strip [id number] Yes USE TO CHECK BLOOD SUGAR twice per  DAY Dispense with meter of choice. Shade Flood, MD Taking Active Self  insulin glargine (LANTUS) 100 UNIT/ML Solostar Pen [id number] Yes Inject 25 Units into the skin daily. Burnadette Pop, MD Taking Active   Insulin Pen Needle (PEN NEEDLES) 32G X 4 MM MISC [id number] Yes 1 application by Does not apply route daily. Shade Flood, MD Taking Active Self  Insulin Pen Needle 32G X 4 MM MISC [id number] Yes Use as directed with insulin pen. Burnadette Pop, MD Taking Active   losartan (COZAAR) 100 MG tablet [id number] Yes Take 1 tablet (100 mg total) by mouth daily. Burnadette Pop, MD Taking Active   metFORMIN (GLUCOPHAGE) 850 MG tablet [id number] Yes Take 1 tablet (850 mg total) by mouth 2 (two) times daily with a meal. Shade Flood, MD Taking Active Self  NIFEdipine (ADALAT CC) 90 MG 24 hr tablet [id number] Yes Take 1 tablet (90 mg total) by mouth daily. Burnadette Pop, MD Taking Active   potassium chloride (KLOR-CON) 10 MEQ tablet [id number] No TAKE 2 TABLETS(20 MEQ) BY MOUTH DAILY  Patient not taking: Reported on 07/19/2023   Shade Flood, MD Not Taking Active Self            Home Care and Equipment/Supplies: Were Home Health Services Ordered?: No Any new equipment or medical supplies ordered?: No  Functional Questionnaire: Do you need assistance with bathing/showering or dressing?: No Do you need assistance with meal preparation?: No Do you need  assistance with eating?: No Do you have difficulty maintaining continence: No Do you need assistance with getting out of bed/getting out of a chair/moving?: No Do you have difficulty managing or taking your medications?: No  Follow up appointments reviewed: PCP Follow-up appointment confirmed?: Yes Date of PCP follow-up appointment?: 07/27/23 Follow-up  Provider: Dr Neva Seat [facility] Follow-up appointment confirmed?: NA Do you need transportation to your follow-up appointment?: No Do you understand care options if your condition(s) worsen?: Yes-patient verbalized understanding    SIGNATURE  Woodfin Ganja LPN [facility] Nurse Health Advisor Direct Dial [phone]

## 2023-07-20 ENCOUNTER — Emergency Department (HOSPITAL_COMMUNITY)
Admission: EM | Admit: 2023-07-20 | Discharge: 2023-07-20 | Disposition: A | Discharge status: Home / Self Care | Payer: 59 | Attending: Emergency Medicine | Admitting: Emergency Medicine

## 2023-07-20 ENCOUNTER — Other Ambulatory Visit: Payer: Self-pay

## 2023-07-20 ENCOUNTER — Emergency Department (HOSPITAL_COMMUNITY): Payer: 59

## 2023-07-20 ENCOUNTER — Encounter (HOSPITAL_COMMUNITY): Payer: Self-pay

## 2023-07-20 DIAGNOSIS — W010XXA Fall on same level from slipping, tripping and stumbling without subsequent striking against object, initial encounter: Secondary | ICD-10-CM | POA: Insufficient documentation

## 2023-07-20 DIAGNOSIS — R42 Dizziness and giddiness: Secondary | ICD-10-CM

## 2023-07-20 DIAGNOSIS — Z7984 Long term (current) use of oral hypoglycemic drugs: Secondary | ICD-10-CM | POA: Diagnosis not present

## 2023-07-20 DIAGNOSIS — Z794 Long term (current) use of insulin: Secondary | ICD-10-CM | POA: Diagnosis not present

## 2023-07-20 DIAGNOSIS — Y9301 Activity, walking, marching and hiking: Secondary | ICD-10-CM | POA: Insufficient documentation

## 2023-07-20 DIAGNOSIS — E1165 Type 2 diabetes mellitus with hyperglycemia: Secondary | ICD-10-CM | POA: Insufficient documentation

## 2023-07-20 DIAGNOSIS — Z7982 Long term (current) use of aspirin: Secondary | ICD-10-CM | POA: Diagnosis not present

## 2023-07-20 DIAGNOSIS — R739 Hyperglycemia, unspecified: Secondary | ICD-10-CM

## 2023-07-20 LAB — COMPREHENSIVE METABOLIC PANEL
ALT: 25 U/L (ref 0–44)
AST: 30 U/L (ref 15–41)
Albumin: 3.8 g/dL (ref 3.5–5.0)
Alkaline Phosphatase: 60 U/L (ref 38–126)
Anion gap: 13 (ref 5–15)
BUN: 19 mg/dL (ref 6–20)
CO2: 23 mmol/L (ref 22–32)
Calcium: 10 mg/dL (ref 8.9–10.3)
Chloride: 98 mmol/L (ref 98–111)
Creatinine, Ser: 1.3 mg/dL — ABNORMAL HIGH (ref 0.61–1.24)
GFR, Estimated: >60 mL/min (ref >60)
Glucose, Bld: 308 mg/dL — ABNORMAL HIGH (ref 70–99)
Potassium: 3 mmol/L — ABNORMAL LOW (ref 3.5–5.1)
Sodium: 134 mmol/L — ABNORMAL LOW (ref 135–145)
Total Bilirubin: 0.8 mg/dL (ref 0.3–1.2)
Total Protein: 7.4 g/dL (ref 6.5–8.1)

## 2023-07-20 LAB — CBC
HCT: 42.4 % (ref 39.0–52.0)
Hemoglobin: 15.1 g/dL (ref 13.0–17.0)
MCH: 29.8 pg (ref 26.0–34.0)
MCHC: 35.6 g/dL (ref 30.0–36.0)
MCV: 83.6 fL (ref 80.0–100.0)
Platelets: 192 10*3/uL (ref 150–400)
RBC: 5.07 MIL/uL (ref 4.22–5.81)
RDW: 13.3 % (ref 11.5–15.5)
WBC: 6.5 10*3/uL (ref 4.0–10.5)
nRBC: 0 % (ref 0.0–0.2)

## 2023-07-20 LAB — URINALYSIS, ROUTINE W REFLEX MICROSCOPIC
Bilirubin Urine: NEGATIVE
Glucose, UA: >=500 mg/dL — AB
Hgb urine dipstick: NEGATIVE
Ketones, ur: NEGATIVE mg/dL
Leukocytes,Ua: NEGATIVE
Nitrite: NEGATIVE
Protein, ur: 100 mg/dL — AB
Specific Gravity, Urine: 1.018 (ref 1.005–1.030)
pH: 5 (ref 5.0–8.0)

## 2023-07-20 LAB — CBG MONITORING, ED: Glucose-Capillary: 335 mg/dL — ABNORMAL HIGH (ref 70–99)

## 2023-07-20 MED ORDER — LACTATED RINGERS IV BOLUS
500.0000 mL | Freq: Once | INTRAVENOUS | Status: AC
  Administered 2023-07-20: 500 mL via INTRAVENOUS

## 2023-07-20 NOTE — ED Notes (Signed)
Pt received dc papers and verbalized understanding of f/u care at time of DC. Pt ambulated out of ED w steady gait, a&ox4,vss,nad.

## 2023-07-20 NOTE — ED Triage Notes (Signed)
PT arrives via POV. PT states he was released from the hospital on Tuesday. Pt states since being released he has been having dizziness, fatigue, and generalized weakness. No focal neuro deficits noted during triage. Pt had a loop recorder placed while he was in the hospital. Pt denies chest pain or sob. AxOx4.

## 2023-07-20 NOTE — ED Provider Notes (Signed)
[place] EMERGENCY DEPARTMENT AT [facility] Provider Note   CSN: [id number] Arrival date & time: 07/20/23  1103     History  Chief Complaint  Patient presents with   Dizziness   Fatigue    Sabriel Samberg is a 57 y.o. male.  HPI Chief complaint is dizzy 57 year old male presents today stating that he felt off balance on waking this morning.  He is walking without difficulty.  He describes his dizziness and things moving.  Patient was discharged from the hospital 2 days ago.  He had workup for difficulty speaking with left-sided weakness, elevated troponin, and hypertensive emergency with uncalled type 2 diabetes.  He reports that he has been taking his medication as prescribed since that time. When he was admitted he had MRI showed mild to moderate age cerebral white matter signal changes CT angio of head and neck that was normal LDL that was elevated     Home Medications Prior to Admission medications   Medication Sig Start Date End Date Taking? Authorizing Provider  Accu-Chek FastClix Lancets MISC USE TO CHECK BLOOD SUGAR EVERY DAY 09/08/21   Shade Flood, MD  aspirin EC 81 MG tablet Take 1 tablet (81 mg total) by mouth daily for 30 days, then as directed by provider. Swallow whole. 07/19/23   Burnadette Pop, MD  atorvastatin (LIPITOR) 40 MG tablet Take 1 tablet (40 mg total) by mouth at bedtime. 07/18/23   Burnadette Pop, MD  blood glucose meter kit and supplies Check once per day. 09/15/22   Shade Flood, MD  clopidogrel (PLAVIX) 75 MG tablet Take 1 tablet (75 mg total) by mouth daily for 19 days. 07/19/23 08/07/23  Burnadette Pop, MD  ezetimibe (ZETIA) 10 MG tablet Take 1 tablet (10 mg total) by mouth daily. 07/19/23   Burnadette Pop, MD  glipiZIDE (GLUCOTROL) 10 MG tablet Take 1 tablet (10 mg total) by mouth 2 (two) times daily before a meal. 12/16/22   Shade Flood, MD  glucose blood (ACCU-CHEK GUIDE) test strip USE TO CHECK BLOOD SUGAR twice per   DAY Dispense with meter of choice. 09/15/22   Shade Flood, MD  insulin glargine (LANTUS) 100 UNIT/ML Solostar Pen Inject 25 Units into the skin daily. 07/18/23   Burnadette Pop, MD  Insulin Pen Needle (PEN NEEDLES) 32G X 4 MM MISC 1 application by Does not apply route daily. 08/27/21   Shade Flood, MD  Insulin Pen Needle 32G X 4 MM MISC Use as directed with insulin pen. 07/18/23   Burnadette Pop, MD  losartan (COZAAR) 100 MG tablet Take 1 tablet (100 mg total) by mouth daily. 07/19/23   Burnadette Pop, MD  metFORMIN (GLUCOPHAGE) 850 MG tablet Take 1 tablet (850 mg total) by mouth 2 (two) times daily with a meal. 12/16/22   Shade Flood, MD  NIFEdipine (ADALAT CC) 90 MG 24 hr tablet Take 1 tablet (90 mg total) by mouth daily. 07/19/23   Burnadette Pop, MD  potassium chloride (KLOR-CON) 10 MEQ tablet TAKE 2 TABLETS(20 MEQ) BY MOUTH DAILY Patient not taking: Reported on 07/19/2023 07/10/23   Shade Flood, MD      Allergies    Patient has no known allergies.    Review of Systems   Review of Systems  Physical Exam Updated Vital Signs BP (!) 147/84   Pulse 81   Temp 99.1 F (37.3 C) (Oral)   Resp 15   Ht 1.829 m (6')   Wt 99.3 kg  SpO2 97%   BMI 29.70 kg/m  Physical Exam Vitals and nursing note reviewed.  Constitutional:      Appearance: Normal appearance.  HENT:     Head: Normocephalic.     Right Ear: External ear normal.     Left Ear: External ear normal.     Nose: Nose normal.     Mouth/Throat:     Pharynx: Oropharynx is clear.  Eyes:     Pupils: Pupils are equal, round, and reactive to light.  Cardiovascular:     Rate and Rhythm: Normal rate and regular rhythm.     Pulses: Normal pulses.  Pulmonary:     Effort: Pulmonary effort is normal.  Abdominal:     General: Abdomen is flat.     Palpations: Abdomen is soft.  Musculoskeletal:        General: Normal range of motion.     Cervical back: Normal range of motion.  Skin:    General: Skin is warm and  dry.     Capillary Refill: Capillary refill takes less than 2 seconds.  Neurological:     General: No focal deficit present.     Mental Status: He is alert and oriented to person, place, and time.  Psychiatric:        Mood and Affect: Mood normal.        Behavior: Behavior normal.     ED Results / Procedures / Treatments   Labs (all labs ordered are listed, but only abnormal results are displayed) Labs Reviewed  URINALYSIS, ROUTINE W REFLEX MICROSCOPIC - Abnormal; Notable for the following components:      Result Value   Glucose, UA >=500 (*)    Protein, ur 100 (*)    Bacteria, UA RARE (*)    All other components within normal limits  COMPREHENSIVE METABOLIC PANEL - Abnormal; Notable for the following components:   Sodium 134 (*)    Potassium 3.0 (*)    Glucose, Bld 308 (*)    Creatinine, Ser 1.30 (*)    All other components within normal limits  CBG MONITORING, ED - Abnormal; Notable for the following components:   Glucose-Capillary 335 (*)    All other components within normal limits  CBC    EKG EKG Interpretation Date/Time:  Thursday July 20 2023 11:10:15 EDT Ventricular Rate:  90 PR Interval:  184 QRS Duration:  94 QT Interval:  350 QTC Calculation: 428 R Axis:   -56  Text Interpretation: Normal sinus rhythm Incomplete right bundle branch block Left anterior fascicular block Minimal voltage criteria for LVH, may be normal variant ( Cornell product ) Abnormal ECG When compared with ECG of 17-Jul-2023 14:19, PREVIOUS ECG IS PRESENT No significant change since last tracing Confirmed by Margarita Grizzle [id number]) on 07/20/2023 3:23:53 PM  Radiology MR BRAIN WO CONTRAST  Result Date: 07/20/2023 CLINICAL DATA:  Neuro deficit, acute, stroke suspected EXAM: MRI HEAD WITHOUT CONTRAST TECHNIQUE: Multiplanar, multiecho pulse sequences of the brain and surrounding structures were obtained without intravenous contrast. COMPARISON:  Brain MRI 07/17/2023 FINDINGS: Brain: Negative for  an acute infarct. No hemorrhage. No hydrocephalus. No extra-axial fluid collection. There is sequela of mild chronic microvascular ischemic change. Mass effect. No mass lesion Vascular: Normal flow voids. Skull and upper cervical spine: Normal marrow signal. Sinuses/Orbits: No middle ear or mastoid effusion. Polypoid mucosal thickening in the left maxillary sinus. Orbits are otherwise unremarkable. Other: None. IMPRESSION: No acute intracranial process. Electronically Signed   By: Elige Radon.D.  On: 07/20/2023 15:08   EP PPM/ICD IMPLANT  Result Date: 07/18/2023 CONCLUSIONS:  1. 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 No early apparent complications.    Procedures Procedures    Medications Ordered in ED Medications  lactated ringers bolus 500 mL (has no administration in time range)    ED Course/ Medical Decision Making/ A&P Clinical Course as of 07/20/23 1524  Thu Jul 20, 2023  1353 CBC reviewed interpreted within normal limits [DR]  1353 Pleat metabolic panel reviewed interpreted send for mild hyponatremia, hypokalemia, glucose elevated at 308 creatinine 1.3 [DR]  1522 MRI with no acute intracranial process [DR]    Clinical Course User Index [DR] Margarita Grizzle, MD                                 Medical Decision Making Amount and/or Complexity of Data Reviewed Labs: ordered. Radiology: ordered.   57 year old male with recent admission for arrhythmia presents today with vertigo symptoms.  No lateralized deficits noted.  Patient with workup here significant for hyperglycemia otherwise normal MRI brain shows no evidence of new stroke. Patient appears stable for discharge to home       Final Clinical Impression(s) / ED Diagnoses Final diagnoses:  Vertigo  Hyperglycemia    Rx / DC Orders ED Discharge Orders     None         Margarita Grizzle, MD 07/20/23 1524

## 2023-07-27 ENCOUNTER — Ambulatory Visit (INDEPENDENT_AMBULATORY_CARE_PROVIDER_SITE_OTHER): Payer: 59 | Admitting: Family Medicine

## 2023-07-27 ENCOUNTER — Encounter: Payer: Self-pay | Admitting: Family Medicine

## 2023-07-27 VITALS — BP 124/70 | HR 81 | Temp 98.2°F | Ht 72.0 in | Wt 221.0 lb

## 2023-07-27 DIAGNOSIS — E876 Hypokalemia: Secondary | ICD-10-CM

## 2023-07-27 DIAGNOSIS — G459 Transient cerebral ischemic attack, unspecified: Secondary | ICD-10-CM | POA: Diagnosis not present

## 2023-07-27 DIAGNOSIS — E1165 Type 2 diabetes mellitus with hyperglycemia: Secondary | ICD-10-CM

## 2023-07-27 DIAGNOSIS — N179 Acute kidney failure, unspecified: Secondary | ICD-10-CM

## 2023-07-27 DIAGNOSIS — H538 Other visual disturbances: Secondary | ICD-10-CM

## 2023-07-27 DIAGNOSIS — I161 Hypertensive emergency: Secondary | ICD-10-CM | POA: Diagnosis not present

## 2023-07-27 DIAGNOSIS — R739 Hyperglycemia, unspecified: Secondary | ICD-10-CM

## 2023-07-27 DIAGNOSIS — Z794 Long term (current) use of insulin: Secondary | ICD-10-CM

## 2023-07-27 DIAGNOSIS — Z7984 Long term (current) use of oral hypoglycemic drugs: Secondary | ICD-10-CM

## 2023-07-27 LAB — GLUCOSE, POCT (MANUAL RESULT ENTRY): POC Glucose: 345 mg/dl — AB (ref 70–99)

## 2023-07-27 MED ORDER — BLOOD GLUCOSE TEST VI STRP
1.0000 | ORAL_STRIP | Freq: Three times a day (TID) | 0 refills | Status: AC
Start: 2023-07-27 — End: 2023-08-26

## 2023-07-27 MED ORDER — LANCET DEVICE MISC
1.0000 | Freq: Three times a day (TID) | 0 refills | Status: AC
Start: 2023-07-27 — End: 2023-08-26

## 2023-07-27 MED ORDER — LANCETS MISC. MISC
1.0000 | Freq: Three times a day (TID) | 0 refills | Status: DC
Start: 2023-07-27 — End: 2023-08-16

## 2023-07-27 MED ORDER — BLOOD GLUCOSE MONITORING SUPPL DEVI
1.0000 | Freq: Three times a day (TID) | 0 refills | Status: DC
Start: 2023-07-27 — End: 2023-12-07

## 2023-07-27 NOTE — Progress Notes (Signed)
Subjective:  Patient ID: Dylan Abbott, male    DOB: 1966-04-14  Age: 57 y.o. MRN: [id number]  CC:  Chief Complaint  Patient presents with   Hospitalization Follow-up    Pt was dizzy, pain in chest, was advised to go to ED, pt is feeling better today     HPI Dylan Abbott presents for   ER follow-up, and hospital follow-up, transition of care visit.  Transition of care phone call noted from July 31. Improving on the [facility] call and no acute needs identified.  Today's visit is in person. Last visit 12/16/22 with me, 3 month follow up planned.   TIA, left-sided weakness and speech abnormality Admitted July 28 through July 30, with hypertensive emergency, severely hypertensive on presentation.  Improved with restarting home medications but his dose of losartan and nifedipine was increased.  Home monitoring recommended as well as compliance with meds discussed. MRI of the brain without contrast showed mild to moderate for age cerebral white matter signal changes with chronic small vessel disease, no acute findings, CT angio of the head and neck was normal.  Neurology recommended aspirin and Plavix for 3 weeks then continue aspirin.  Concerning for TIA, loop recorder placed with plan for follow-up with EP. Neuro follow up 9/4.   He was noted to have elevated troponin, most likely from demand ischemia, EF 55 to 60% with grade 1 diastolic dysfunction, no wall motion abnormality on echocardiogram.   NSTEMI noted on hospital discharge, initially presented with chest pain.  He was seen again through the emergency room for dizziness, fatigue on August 1.  Reported feeling off balance.  EKG without apparent significant changes.  Glucose was 335.  Repeat MRI brain without acute intracranial process.  Borderline hyponatremia with sodium 134, hypokalemia with potassium 3.0.  Discharged home.  Feeling better since hospital. No chest pain, no palpitations. Dizziness has improved. No new concerns, no new  weakness, or trouble speaking since hospitalization. Able to take all meds. No new bleeding on plavix.   AKI Reported to be improved during hospitalization. Lab Results  Component Value Date   CREATININE 1.30 (H) 07/20/2023   Diabetes: Uncontrolled, previously not insulin-dependent.  Off trulicity - trouble obtaining meds. Was taking glipizide and metformin. But significant elevated A1c at 12.2.  Previously on glipizide and metformin.  Was started on insulin, Lantus 25 units daily in addition to his other home meds. Not checking home readings - has expired meter?  Taking lantus 25 units per day, no known low symtpoms.  Taking glipizide 10mg  BID - needs new rx, and metformin 850mg  BID.  Some blurry vision both eyes - past few months. Harder to see up close. Some increased thirst and urinary frequency during that time as well.   Lab Results  Component Value Date   HGBA1C 12.2 (H) 07/18/2023   HGBA1C 12.1 (H) 07/18/2023   HGBA1C 8.1 (H) 12/16/2022   Lab Results  Component Value Date   MICROALBUR 1.4 11/16/2022   LDLCALC 87 07/18/2023   CREATININE 1.30 (H) 07/20/2023   Hyperlipidemia: Treated with Lipitor, Zetia. - tolerating both. No new side effects.  Lab Results  Component Value Date   CHOL 152 07/18/2023   HDL 55 07/18/2023   LDLCALC 87 07/18/2023   TRIG 49 07/18/2023   CHOLHDL 2.8 07/18/2023   Lab Results  Component Value Date   ALT 25 07/20/2023   AST 30 07/20/2023   ALKPHOS 60 07/20/2023   BILITOT 0.8 07/20/2023   Hypertension: With emergency  as above. Taking meds. On losartan, nifiedipine, unsure if taking potassium. Has Rx.  Home readings: none. Asx as above.  BP Readings from Last 3 Encounters:  07/27/23 124/70  07/20/23 (!) 153/91  07/18/23 (!) 189/98   Lab Results  Component Value Date   CREATININE 1.30 (H) 07/20/2023        History Patient Active Problem List   Diagnosis Date Noted   NSTEMI (non-ST elevated myocardial infarction) (HCC)  07/16/2023   Impingement syndrome of left shoulder 07/30/2020   COVID-19 virus infection 10/31/2019   Abnormal liver function 10/31/2019   Hypokalemia 10/31/2019   Hyponatremia 10/31/2019   Diabetes mellitus (HCC) 11/13/2017   Hyperlipidemia 11/13/2017   Hypertensive disorder 11/13/2017   Past Medical History:  Diagnosis Date   Diabetes mellitus without complication (HCC)    Hyperlipidemia    Hypertension    Past Surgical History:  Procedure Laterality Date   LOOP RECORDER INSERTION N/A 07/18/2023   Procedure: LOOP RECORDER INSERTION;  Surgeon: Graciella Freer, PA-C;  Location: [facility];  Service: Cardiovascular;  Laterality: N/A;   No Known Allergies Prior to Admission medications   Medication Sig Start Date End Date Taking? Authorizing Provider  Accu-Chek FastClix Lancets MISC USE TO CHECK BLOOD SUGAR EVERY DAY 09/08/21  Yes Shade Flood, MD  aspirin EC 81 MG tablet Take 1 tablet (81 mg total) by mouth daily for 30 days, then as directed by provider. Swallow whole. 07/19/23  Yes Burnadette Pop, MD  atorvastatin (LIPITOR) 40 MG tablet Take 1 tablet (40 mg total) by mouth at bedtime. 07/18/23  Yes Burnadette Pop, MD  blood glucose meter kit and supplies Check once per day. 09/15/22  Yes Shade Flood, MD  clopidogrel (PLAVIX) 75 MG tablet Take 1 tablet (75 mg total) by mouth daily for 19 days. 07/19/23 08/07/23 Yes Burnadette Pop, MD  ezetimibe (ZETIA) 10 MG tablet Take 1 tablet (10 mg total) by mouth daily. 07/19/23  Yes Burnadette Pop, MD  glipiZIDE (GLUCOTROL) 10 MG tablet Take 1 tablet (10 mg total) by mouth 2 (two) times daily before a meal. 12/16/22  Yes Shade Flood, MD  glucose blood (ACCU-CHEK GUIDE) test strip USE TO CHECK BLOOD SUGAR twice per  DAY Dispense with meter of choice. 09/15/22  Yes Shade Flood, MD  insulin glargine (LANTUS) 100 UNIT/ML Solostar Pen Inject 25 Units into the skin daily. 07/18/23  Yes Burnadette Pop, MD  Insulin  Pen Needle (PEN NEEDLES) 32G X 4 MM MISC 1 application by Does not apply route daily. 08/27/21  Yes Shade Flood, MD  Insulin Pen Needle 32G X 4 MM MISC Use as directed with insulin pen. 07/18/23  Yes Adhikari, Willia Craze, MD  losartan (COZAAR) 100 MG tablet Take 1 tablet (100 mg total) by mouth daily. 07/19/23  Yes Burnadette Pop, MD  metFORMIN (GLUCOPHAGE) 850 MG tablet Take 1 tablet (850 mg total) by mouth 2 (two) times daily with a meal. 12/16/22  Yes Shade Flood, MD  NIFEdipine (ADALAT CC) 90 MG 24 hr tablet Take 1 tablet (90 mg total) by mouth daily. 07/19/23  Yes Burnadette Pop, MD  potassium chloride (KLOR-CON) 10 MEQ tablet TAKE 2 TABLETS(20 MEQ) BY MOUTH DAILY 07/10/23  Yes Shade Flood, MD   Social History   Socioeconomic History   Marital status: Married    Spouse name: Not on file   Number of children: 2   Years of education: Not on file   Highest education level:  Not on file  Occupational History   Occupation: environmental  Tobacco Use   Smoking status: Never   Smokeless tobacco: Never  Substance and Sexual Activity   Alcohol use: Never    Alcohol/week: 0.0 standard drinks of alcohol   Drug use: Never   Sexual activity: Yes  Other Topics Concern   Not on file  Social History Narrative   Not on file   Social Determinants of Health   Financial Resource Strain: Not on file  Food Insecurity: No Food Insecurity (07/17/2023)   Hunger Vital Sign    Worried About Running Out of Food in the Last Year: Never true    Ran Out of Food in the Last Year: Never true  Transportation Needs: No Transportation Needs (07/17/2023)   PRAPARE - Administrator, Civil Service (Medical): No    Lack of Transportation (Non-Medical): No  Physical Activity: Not on file  Stress: Not on file  Social Connections: Not on file  Intimate Partner Violence: Not At Risk (07/17/2023)   Humiliation, Afraid, Rape, and Kick questionnaire    Fear of Current or Ex-Partner: No     Emotionally Abused: No    Physically Abused: No    Sexually Abused: No    Review of Systems   Objective:   Vitals:   07/27/23 1057  BP: 124/70  Pulse: 81  Temp: 98.2 F (36.8 C)  TempSrc: Temporal  SpO2: 100%  Weight: 221 lb (100.2 kg)  Height: 6' (1.829 m)     Physical Exam Vitals reviewed.  Constitutional:      Appearance: He is well-developed.  HENT:     Head: Normocephalic and atraumatic.  Neck:     Vascular: No carotid bruit or JVD.  Cardiovascular:     Rate and Rhythm: Normal rate and regular rhythm.     Heart sounds: Normal heart sounds. No murmur heard. Pulmonary:     Effort: Pulmonary effort is normal.     Breath sounds: Normal breath sounds. No rales.  Musculoskeletal:     Right lower leg: No edema.     Left lower leg: No edema.  Skin:    General: Skin is warm and dry.  Neurological:     Mental Status: He is alert and oriented to person, place, and time.     Comments: Speaking in full sentences, equal facial movements, no focal weakness appreciated on exam.  Psychiatric:        Mood and Affect: Mood normal.        Assessment & Plan:  Dylan Abbott is a 57 y.o. male . Hypertensive emergency TIA (transient ischemic attack)  -Suspected TIA, with hypertensive emergency, NSTEMI as above with recent hospitalization, elevated troponin due to demand.  Asymptomatic at this time, no new weakness or focal neurologic symptoms.  Blurry vision as below, likely related to hypoglycemia.  Tolerating Plavix plus aspirin.  Continue same.  Follow-up with neurology as planned with ER precautions given.  Type 2 diabetes mellitus with hyperglycemia, with long-term current use of insulin (HCC) - Plan: Blood Glucose Monitoring Suppl DEVI, Glucose Blood (BLOOD GLUCOSE TEST STRIPS) STRP, Lancet Device MISC, Lancets Misc. MISC  -Uncontrolled diabetes based on recent A1c, in part due to coming off Trulicity.  However based on that reading agree with institution of insulin.   Now on Lantus 25 units/day.  In office reading elevated, check BMP and if elevated will adjust regimen.  Continue metformin, glipizide doses the same for now.  Reports compliance with  meds currently.  Close follow-up in 1 week.  New meter provided for home monitoring for that visit.  RTC precautions.  Hypokalemia - Plan: Basic metabolic panel, POCT glucose (manual entry)  -Unsure if he is taking potassium supplement at home.  Asked that he verify his prescriptions when he returns home.  Check BMP and adjust plan/regimen accordingly, which will depend on what he is taking at home.  AKI (acute kidney injury) (HCC)  -As above, status post hospitalization, updated creatinine with labs above.  Blurred vision Hyperglycemia  -Blurred vision likely due to uncontrolled diabetes and hyperglycemia, may need to adjust Lantus dose as above, and BMP pending.  If not improving with improvement in hyperglycemia then would recommend evaluation with his eye care provider.  Meds ordered this encounter  Medications   Blood Glucose Monitoring Suppl DEVI    Sig: 1 each by Does not apply route in the morning, at noon, and at bedtime. May substitute to any manufacturer covered by patient's insurance.    Dispense:  1 each    Refill:  0   Glucose Blood (BLOOD GLUCOSE TEST STRIPS) STRP    Sig: 1 each by In Vitro route in the morning, at noon, and at bedtime. May substitute to any manufacturer covered by patient's insurance.    Dispense:  100 strip    Refill:  0   Lancet Device MISC    Sig: 1 each by Does not apply route in the morning, at noon, and at bedtime. May substitute to any manufacturer covered by patient's insurance.    Dispense:  1 each    Refill:  0   Lancets Misc. MISC    Sig: 1 each by Does not apply route in the morning, at noon, and at bedtime. May substitute to any manufacturer covered by patient's insurance.    Dispense:  100 each    Refill:  0   Patient Instructions  Thank you for coming in  today.  I am glad you are feeling better.  Continue same blood pressure medications for now.  If any return of trouble speaking, new headaches, weakness of 1 arm or 1 leg or difficulty moving face to be seen in the emergency room.  I do not expect that to occur.  We will recheck your blood pressure at follow-up in the next week.  Continue follow-up with the heart specialist to review the results of your heart monitor that is in place currently.  For diabetes, I ordered a new blood sugar meter, please check readings fasting, 2 hours after a meal with 3 readings per day, bring those results to your next visit.  Continue same dose of medication for now.  Potassium was low at the recent ER visit.  Make sure you are taking the potassium chloride supplement, 2 pills/day that is on your medication list.  I will check those levels again today.  Please let me know if you have not been taking those medications as that will change treatment of any abnormal readings from today's labs.  No other medication changes from the hospital at this time.  Recheck in 1 week.  Take care!  Return to the clinic or go to the nearest emergency room if any of your symptoms worsen or new symptoms occur.      Signed,   Meredith Staggers, MD [facility], [facility] 07/27/23 12:13 PM

## 2023-07-27 NOTE — Patient Instructions (Signed)
Thank you for coming in today.  I am glad you are feeling better.  Continue same blood pressure medications for now.  If any return of trouble speaking, new headaches, weakness of 1 arm or 1 leg or difficulty moving face to be seen in the emergency room.  I do not expect that to occur.  We will recheck your blood pressure at follow-up in the next week.  Continue follow-up with the heart specialist to review the results of your heart monitor that is in place currently.  For diabetes, I ordered a new blood sugar meter, please check readings fasting, 2 hours after a meal with 3 readings per day, bring those results to your next visit.  Continue same dose of medication for now.  Potassium was low at the recent ER visit.  Make sure you are taking the potassium chloride supplement, 2 pills/day that is on your medication list.  I will check those levels again today.  Please let me know if you have not been taking those medications as that will change treatment of any abnormal readings from today's labs.  No other medication changes from the hospital at this time.  Recheck in 1 week.  Take care!  Return to the clinic or go to the nearest emergency room if any of your symptoms worsen or new symptoms occur.

## 2023-08-03 ENCOUNTER — Ambulatory Visit (INDEPENDENT_AMBULATORY_CARE_PROVIDER_SITE_OTHER): Payer: 59 | Admitting: Family Medicine

## 2023-08-03 VITALS — BP 132/80 | HR 78 | Temp 98.5°F | Ht 72.0 in | Wt 218.2 lb

## 2023-08-03 DIAGNOSIS — Z794 Long term (current) use of insulin: Secondary | ICD-10-CM

## 2023-08-03 DIAGNOSIS — E1165 Type 2 diabetes mellitus with hyperglycemia: Secondary | ICD-10-CM

## 2023-08-03 DIAGNOSIS — E876 Hypokalemia: Secondary | ICD-10-CM | POA: Diagnosis not present

## 2023-08-03 DIAGNOSIS — E871 Hypo-osmolality and hyponatremia: Secondary | ICD-10-CM

## 2023-08-03 DIAGNOSIS — I10 Essential (primary) hypertension: Secondary | ICD-10-CM | POA: Diagnosis not present

## 2023-08-03 DIAGNOSIS — R42 Dizziness and giddiness: Secondary | ICD-10-CM

## 2023-08-03 DIAGNOSIS — Z8673 Personal history of transient ischemic attack (TIA), and cerebral infarction without residual deficits: Secondary | ICD-10-CM

## 2023-08-03 DIAGNOSIS — G459 Transient cerebral ischemic attack, unspecified: Secondary | ICD-10-CM

## 2023-08-03 MED ORDER — ATORVASTATIN CALCIUM 40 MG PO TABS
40.0000 mg | ORAL_TABLET | Freq: Every day | ORAL | 1 refills | Status: DC
Start: 2023-08-03 — End: 2023-11-03

## 2023-08-03 MED ORDER — LOSARTAN POTASSIUM 100 MG PO TABS
100.0000 mg | ORAL_TABLET | Freq: Every day | ORAL | 1 refills | Status: DC
Start: 2023-08-03 — End: 2024-01-16

## 2023-08-03 MED ORDER — GLIPIZIDE 10 MG PO TABS
10.0000 mg | ORAL_TABLET | Freq: Two times a day (BID) | ORAL | 1 refills | Status: DC
Start: 2023-08-03 — End: 2024-02-01

## 2023-08-03 MED ORDER — METFORMIN HCL 850 MG PO TABS
850.0000 mg | ORAL_TABLET | Freq: Two times a day (BID) | ORAL | 1 refills | Status: DC
Start: 2023-08-03 — End: 2023-11-06

## 2023-08-03 MED ORDER — INSULIN GLARGINE 100 UNIT/ML SOLOSTAR PEN
27.0000 [IU] | PEN_INJECTOR | Freq: Every day | SUBCUTANEOUS | 0 refills | Status: DC
Start: 2023-08-03 — End: 2023-09-13

## 2023-08-03 MED ORDER — PEN NEEDLES 32G X 4 MM MISC
1.0000 | Freq: Every day | 3 refills | Status: DC
Start: 2023-08-03 — End: 2023-11-03

## 2023-08-03 MED ORDER — POTASSIUM CHLORIDE ER 10 MEQ PO TBCR
20.0000 meq | EXTENDED_RELEASE_TABLET | Freq: Every day | ORAL | 1 refills | Status: DC
Start: 2023-08-03 — End: 2023-11-03

## 2023-08-03 MED ORDER — NIFEDIPINE ER 90 MG PO TB24
90.0000 mg | ORAL_TABLET | Freq: Every day | ORAL | 1 refills | Status: DC
Start: 2023-08-03 — End: 2023-10-11

## 2023-08-03 NOTE — Patient Instructions (Addendum)
Ok to stay on aspirin alone after clopidrogel runs out. Keep follow up with neurology as planned on September 4th. There may be multiple causes for balance/dizziness. Elevatedd blood sugar may be part of the cause - increase insulin to 27 units for next 2 days, then increase to 29 units if still above 200's in 2 days. Virtual visit in 1 week to discuss blood sugar and other changes.  Make sure you stay hydrated. I will check your labs today. Be seen in the ER if any new weakness or worsening dizziness.   Bring your medications to your visit so we can make sure you are taking what we have listed and make sure there are no missing medications.  Return to the clinic or go to the nearest emergency room if any of your symptoms worsen or new symptoms occur.   For closer [place] office, I recommend the [facility] location. Please call their office and I will ok a transfer to one of their providers.   [facility] [address] [place], [place] [address] [phone]

## 2023-08-03 NOTE — Progress Notes (Signed)
Subjective:  Patient ID: Dylan Abbott, male    DOB: [birth year]  Age: 57 y.o. MRN: [id number]  CC:  Chief Complaint  Patient presents with   Hypertension    Pt notes some spells of dizziness over the last week makes him feeling unbalanced didn't know if this was a side effect of the medication     HPI Dylan Abbott presents for   Hypertension: Follow-up from August 8.prior visit with me was in 11/2022. 07/27/23 visit for transition of care visit with hypertensive emergency, TIA, left-sided weakness and speech abnormality when admitted in July.  MRI brain at that time without acute findings, mild to moderate for age cerebral white matter signal changes and chronic small vessel disease.  Normal CT angio of the head and neck.  He was started on Plavix and aspirin for 3 weeks, then transition to aspirin, loop recorder placed and follow-up with EP and neuro.  Neuro appointment September 4.  Elevated troponin at that time thought to be secondary to demand ischemia, NSTEMI noted on documentation with hospital discharge, EF 55 to 60% with grade 1 diastolic dysfunction on his echo.    He was seen again in the ER August 1 with dizziness, fatigue, off-balance feeling.  EKG without apparent significant changes, glucose was 335 and repeat MRI brain without acute intracranial process.  Borderline hyponatremia with sodium 134 and hypokalemia with potassium 3.0.   He was improving at follow-up with me on August 8 without any chest pain or palpitations and dizziness had improved.  Sodium was mildly low at 132 but potassium had normalized.  He continued on losartan, nifedipine for hypertension, potassium supplement 20 mEq daily.  Slight HA yesterday, resolved. Intermittent feeling of off balance, not persistent.  No falls, no focal weakness.   Lab Results  Component Value Date   NA 132 (L) 07/27/2023   K 3.6 07/27/2023   CL 94 (L) 07/27/2023   CO2 30 07/27/2023   Home readings: 130/70 range.  BP  Readings from Last 3 Encounters:  08/03/23 132/80  07/27/23 124/70  07/20/23 (!) 153/91    Diabetes: Uncontrolled with A1c that increased from 8.1 in December of last year to 12.1 in July.  Previously had been on glipizide, metformin, difficulty obtaining Trulicity.  Started on Lantus 25 units daily in the hospital, continue on same dose last visit along with his metformin 850 mg twice daily, glipizide 10 mg twice daily. No missed doses.  Recommended increasing Lantus to 27 units/day initially after elevated blood sugar noted on recent labs. Still on 25 units lantus.  He is on statin and ARB as above.  Home readings: 300's. No lows.  No n/v/abd pain.   Lab Results  Component Value Date   HGBA1C 12.2 (H) 07/18/2023   HGBA1C 12.1 (H) 07/18/2023   HGBA1C 8.1 (H) 12/16/2022   Lab Results  Component Value Date   MICROALBUR 1.4 11/16/2022   LDLCALC 87 07/18/2023   CREATININE 1.22 07/27/2023     Lab Results  Component Value Date   CREATININE 1.22 07/27/2023    He notes that he will need to set up a new primary care provider closer to home as difficulty driving out to this office.  History Patient Active Problem List   Diagnosis Date Noted   NSTEMI (non-ST elevated myocardial infarction) (HCC) 07/16/2023   Impingement syndrome of left shoulder 07/30/2020   COVID-19 virus infection 10/31/2019   Abnormal liver function 10/31/2019   Hypokalemia 10/31/2019   Hyponatremia 10/31/2019  Diabetes mellitus (HCC) 11/13/2017   Hyperlipidemia 11/13/2017   Hypertensive disorder 11/13/2017   Past Medical History:  Diagnosis Date   Diabetes mellitus without complication (HCC)    Hyperlipidemia    Hypertension    Past Surgical History:  Procedure Laterality Date   LOOP RECORDER INSERTION N/A 07/18/2023   Procedure: LOOP RECORDER INSERTION;  Surgeon: Graciella Freer, PA-C;  Location: [facility];  Service: Cardiovascular;  Laterality: N/A;   No Known Allergies Prior  to Admission medications   Medication Sig Start Date End Date Taking? Authorizing Provider  Accu-Chek FastClix Lancets MISC USE TO CHECK BLOOD SUGAR EVERY DAY 09/08/21  Yes Shade Flood, MD  aspirin EC 81 MG tablet Take 1 tablet (81 mg total) by mouth daily for 30 days, then as directed by provider. Swallow whole. 07/19/23  Yes Burnadette Pop, MD  atorvastatin (LIPITOR) 40 MG tablet Take 1 tablet (40 mg total) by mouth at bedtime. 07/18/23  Yes Burnadette Pop, MD  blood glucose meter kit and supplies Check once per day. 09/15/22  Yes Shade Flood, MD  Blood Glucose Monitoring Suppl DEVI 1 each by Does not apply route in the morning, at noon, and at bedtime. May substitute to any manufacturer covered by patient's insurance. 07/27/23  Yes Shade Flood, MD  clopidogrel (PLAVIX) 75 MG tablet Take 1 tablet (75 mg total) by mouth daily for 19 days. 07/19/23 08/07/23 Yes Burnadette Pop, MD  ezetimibe (ZETIA) 10 MG tablet Take 1 tablet (10 mg total) by mouth daily. 07/19/23  Yes Burnadette Pop, MD  glipiZIDE (GLUCOTROL) 10 MG tablet Take 1 tablet (10 mg total) by mouth 2 (two) times daily before a meal. 12/16/22  Yes Shade Flood, MD  glucose blood (ACCU-CHEK GUIDE) test strip USE TO CHECK BLOOD SUGAR twice per  DAY Dispense with meter of choice. 09/15/22  Yes Shade Flood, MD  Glucose Blood (BLOOD GLUCOSE TEST STRIPS) STRP 1 each by In Vitro route in the morning, at noon, and at bedtime. May substitute to any manufacturer covered by patient's insurance. 07/27/23 08/26/23 Yes Shade Flood, MD  insulin glargine (LANTUS) 100 UNIT/ML Solostar Pen Inject 25 Units into the skin daily. 07/18/23  Yes Burnadette Pop, MD  Insulin Pen Needle (PEN NEEDLES) 32G X 4 MM MISC 1 application by Does not apply route daily. 08/27/21  Yes Shade Flood, MD  Insulin Pen Needle 32G X 4 MM MISC Use as directed with insulin pen. 07/18/23  Yes Burnadette Pop, MD  Lancet Device MISC 1 each by Does not apply  route in the morning, at noon, and at bedtime. May substitute to any manufacturer covered by patient's insurance. 07/27/23 08/26/23 Yes Shade Flood, MD  Lancets Misc. MISC 1 each by Does not apply route in the morning, at noon, and at bedtime. May substitute to any manufacturer covered by patient's insurance. 07/27/23 08/26/23 Yes Shade Flood, MD  losartan (COZAAR) 100 MG tablet Take 1 tablet (100 mg total) by mouth daily. 07/19/23  Yes Burnadette Pop, MD  metFORMIN (GLUCOPHAGE) 850 MG tablet Take 1 tablet (850 mg total) by mouth 2 (two) times daily with a meal. 12/16/22  Yes Shade Flood, MD  NIFEdipine (ADALAT CC) 90 MG 24 hr tablet Take 1 tablet (90 mg total) by mouth daily. 07/19/23  Yes Burnadette Pop, MD  potassium chloride (KLOR-CON) 10 MEQ tablet TAKE 2 TABLETS(20 MEQ) BY MOUTH DAILY 07/10/23  Yes Shade Flood, MD   Social  History   Socioeconomic History   Marital status: Married    Spouse name: Not on file   Number of children: 2   Years of education: Not on file   Highest education level: Not on file  Occupational History   Occupation: environmental  Tobacco Use   Smoking status: Never   Smokeless tobacco: Never  Substance and Sexual Activity   Alcohol use: Never    Alcohol/week: 0.0 standard drinks of alcohol   Drug use: Never   Sexual activity: Yes  Other Topics Concern   Not on file  Social History Narrative   Not on file   Social Determinants of Health   Financial Resource Strain: Not on file  Food Insecurity: No Food Insecurity (07/17/2023)   Hunger Vital Sign    Worried About Running Out of Food in the Last Year: Never true    Ran Out of Food in the Last Year: Never true  Transportation Needs: No Transportation Needs (07/17/2023)   PRAPARE - Administrator, Civil Service (Medical): No    Lack of Transportation (Non-Medical): No  Physical Activity: Not on file  Stress: Not on file  Social Connections: Not on file  Intimate Partner  Violence: Not At Risk (07/17/2023)   Humiliation, Afraid, Rape, and Kick questionnaire    Fear of Current or Ex-Partner: No    Emotionally Abused: No    Physically Abused: No    Sexually Abused: No    Review of Systems  Per HPI Objective:   Vitals:   08/03/23 1423  BP: 132/80  Pulse: 78  Temp: 98.5 F (36.9 C)  TempSrc: Temporal  SpO2: 98%  Weight: 218 lb 3.2 oz (99 kg)  Height: 6' (1.829 m)     Physical Exam Vitals reviewed.  Constitutional:      Appearance: He is well-developed.  HENT:     Head: Normocephalic and atraumatic.  Neck:     Vascular: No carotid bruit or JVD.  Cardiovascular:     Rate and Rhythm: Normal rate and regular rhythm.     Heart sounds: Normal heart sounds. No murmur heard. Pulmonary:     Effort: Pulmonary effort is normal.     Breath sounds: Normal breath sounds. No rales.  Musculoskeletal:     Right lower leg: No edema.     Left lower leg: No edema.  Skin:    General: Skin is warm and dry.  Neurological:     General: No focal deficit present.     Mental Status: He is alert and oriented to person, place, and time.     Motor: No weakness.     Gait: Gait normal.     Comments: Monitor for exam, equal facial movements.  Equal upper extremity lower extremity strength.  No apparent abnormality on gait, negative Romberg.  No pronator drift.  Normal RAMs and heel-to-shin testing bilaterally.  No nystagmus on EOM testing.  Psychiatric:        Mood and Affect: Mood normal.     Assessment & Plan:  Dylan Abbott is a 57 y.o. male . Essential hypertension - Plan: losartan (COZAAR) 100 MG tablet, NIFEdipine (ADALAT CC) 90 MG 24 hr tablet  -Stable at this time, continue same regimen.  Denies new neurologic symptoms or chest symptoms.  Continue to monitor.  Type 2 diabetes mellitus with hyperglycemia, without long-term current use of insulin (HCC) - Plan: atorvastatin (LIPITOR) 40 MG tablet, glipiZIDE (GLUCOTROL) 10 MG tablet, insulin glargine  (LANTUS) 100 UNIT/ML  Solostar Pen, Insulin Pen Needle (PEN NEEDLES) 32G X 4 MM MISC, metFORMIN (GLUCOPHAGE) 850 MG tablet  -Uncontrolled.  Increase Lantus by 2 units now, then another 2 units in 2 days with close follow-up next week for further adjustments.  ER precautions given.  Hypokalemia - Plan: potassium chloride (KLOR-CON) 10 MEQ tablet  -Normalized on recent labs, continue supplementation  Hyponatremia  -Mild, including with correction for hyperglycemia.  Continue to monitor.  TIA (transient ischemic attack) Dizziness -Possible multiple contributors to dizziness including his significant hyperglycemia.  Treatment of diabetes as above.  Nonfocal neuroexam in office.  Has any changes or acute new neurologic symptoms.  Has neurology follow-up as planned as above.  Continues on Plavix, aspirin.  Close follow-up in 1 week with ER precautions given if any acute changes or worsening.   Understand his concern with travel distance to our office.  Will have a virtual visit next week and provided information on a closer primary care office, I am okay with transfer if needed for that reason.    Meds ordered this encounter  Medications   atorvastatin (LIPITOR) 40 MG tablet    Sig: Take 1 tablet (40 mg total) by mouth at bedtime.    Dispense:  90 tablet    Refill:  1   glipiZIDE (GLUCOTROL) 10 MG tablet    Sig: Take 1 tablet (10 mg total) by mouth 2 (two) times daily before a meal.    Dispense:  180 tablet    Refill:  1   insulin glargine (LANTUS) 100 UNIT/ML Solostar Pen    Sig: Inject 27 Units into the skin daily.    Dispense:  15 mL    Refill:  0   Insulin Pen Needle (PEN NEEDLES) 32G X 4 MM MISC    Sig: 1 application  by Does not apply route daily.    Dispense:  90 each    Refill:  3   losartan (COZAAR) 100 MG tablet    Sig: Take 1 tablet (100 mg total) by mouth daily.    Dispense:  90 tablet    Refill:  1   metFORMIN (GLUCOPHAGE) 850 MG tablet    Sig: Take 1 tablet (850 mg  total) by mouth 2 (two) times daily with a meal.    Dispense:  180 tablet    Refill:  1   NIFEdipine (ADALAT CC) 90 MG 24 hr tablet    Sig: Take 1 tablet (90 mg total) by mouth daily.    Dispense:  180 tablet    Refill:  1   potassium chloride (KLOR-CON) 10 MEQ tablet    Sig: Take 2 tablets (20 mEq total) by mouth daily.    Dispense:  180 tablet    Refill:  1   Patient Instructions  Ok to stay on aspirin alone after clopidrogel runs out. Keep follow up with neurology as planned on September 4th. There may be multiple causes for balance/dizziness. Elevatedd blood sugar may be part of the cause - increase insulin to 27 units for next 2 days, then increase to 29 units if still above 200's in 2 days. Virtual visit in 1 week to discuss blood sugar and other changes.  Make sure you stay hydrated. I will check your labs today. Be seen in the ER if any new weakness or worsening dizziness.   Bring your medications to your visit so we can make sure you are taking what we have listed and make sure there are no missing medications.  Return to the clinic or go to the nearest emergency room if any of your symptoms worsen or new symptoms occur.   For closer [place] office, I recommend the [facility] location. Please call their office and I will ok a transfer to one of their providers.   [facility] at [facility] [address] [place], [place] [address] [phone]     Signed,   Meredith Staggers, MD [facility], [facility] 08/03/23 3:33 PM

## 2023-08-04 ENCOUNTER — Encounter: Payer: Self-pay | Admitting: Family Medicine

## 2023-08-04 ENCOUNTER — Telehealth: Payer: Self-pay | Admitting: Family Medicine

## 2023-08-04 NOTE — Telephone Encounter (Signed)
Patient Name First: [name] Gender: Male DOB: 1966-05-14 Age: 57 Y 10 M 24 D Return Phone Number: [phone] (Primary) Address: City/ State/ Zip: [place]  [address] Client [facility] [place] [facility] [place] Client Site [facility] [facility] Day Provider Dylan Abbott- MD Contact Type Call Who Is Calling Patient / Member / Family / Caregiver Call Type Triage / Clinical Relationship To Patient Self Return Phone Number [phone] (Primary) Chief Complaint Rash - Widespread Reason for Call Symptomatic / Request for Health Information Initial Comment Caller states he is having an allergic reaction to a rx. He has a rash all over and is broken out itching. Translation No Nurse Assessment Nurse: Dylan Cloud, RN, Dylan Abbott Date/Time Dylan Abbott Time): 08/04/2023 12:41:49 PM Confirm and document reason for call. If symptomatic, describe symptoms. ---He does not know exactly what medication or substance may have caused this rash. He had onset of rash yesterday evening, located to head, legs, and arms. He has not treated rash with any OTCs. Does the patient have any new or worsening symptoms? ---Yes Will a triage be completed? ---Yes Related visit to physician within the last 2 weeks? ---No Does the PT have any chronic conditions? (i.e. diabetes, asthma, this includes High risk factors for pregnancy, etc.) ---Yes List chronic conditions. ---hypertension, diabetes, asthma, Is this a behavioral health or substance abuse call? ---No Guidelines Guideline Title Affirmed Question Affirmed Notes Nurse Date/Time (Eastern Time) Rash or Redness - Widespread SEVERE itching (i.e., interferes with sleep, normal activities or school) Dylan Abbott 08/04/2023 12:44:38 PM PLEASE NOTE: All timestamps contained within this report are represented as [place] Standard Time. CONFIDENTIALTY NOTICE: This fax transmission is  intended only for the addressee. It contains information that is legally privileged, confidential or otherwise protected from use or disclosure. If you are not the intended recipient, you are strictly prohibited from reviewing, disclosing, copying using or disseminating any of this information or taking any action in reliance on or regarding this information. If you have received this fax in error, please notify us immediately by telephone so that we can arrange for its return to [place]. Phone: [phone], Toll-Free: [phone], Fax: [phone] Page: 2 of 2 Call Id: [id number] Disp. Time Dylan Abbott Time) Disposition Final User 08/04/2023 1:04:12 PM See PCP within 24 Hours Yes Dylan Cloud RN, Dylan Abbott Final Disposition 08/04/2023 1:04:12 PM See PCP within 24 Hours Yes Dylan Cloud, RN, Dylan Abbott Disagree/Comply Comply Caller Understands Yes PreDisposition Call Doctor Care Advice Given Per Guideline SEE PCP WITHIN 24 HOURS: * IF OFFICE WILL BE OPEN: You need to be examined within the next 24 hours. Call your doctor (or NP/PA) when the office opens and make an appointment. ANTIHISTAMINE MEDICINES FOR MODERATE TO SEVERE ITCHING: * For moderate to severe itching, you can take either cetirizine, fexofenadine, or loratadine. * They are over-the-counter (OTC) antihistamine medicines. You can buy them at a drugstore or grocery store. * CETIRIZINE (REACTINE, ZYRTEC): The adult dose is 10 mg. You take it once a day. Cetirizine is available in the [place] as Zyrtec and in [place] as Reactine. * FEXOFENADINE (ALLEGRA): In the [place], the adult dose is one 24-hour tablet (180 mg) once a day. In [place], the adult dose is one 24-hour tablet (120 mg) once a day. Or, you can take one 12-hour (60 mg) tablet 2 times a day. * LORATADINE (ALAVERT, CLARITIN): The adult dose is 10 mg. You take it once a day. Loratadine is available in the [place] as  Alavert and Claritin; it is available in [place] as  Claritin. HYDROCORTISONE CREAM FOR ITCHING: * You can use hydrocortisone for very itchy spots. * Put 1% hydrocortisone cream on the itchy area(s) 3 times a day. Use it for a couple days, until it feels better. This will help decrease the itching. * This is an over-the-counter (OTC) drug. You can buy it at the drugstore. * Some people like to keep the cream in the refrigerator. It feels even better if the cream is used when it is cold. CALL BACK IF: * Rash becomes purple or blood-colored or blister-like * Fever occurs * You become worse CARE ADVICE given per Rash - Widespread and Cause Unknown (Adult) guideline. ANTIHISTAMINE MEDICINES - EXTRA NOTES AND WARNINGS: * Antihistamine medicines can be used to treat allergic reactions, allergies, hay fever, hives, and itching. * Diphenhydramine (Benadryl) is a FIRST GENERATION ANTIHISTAMINE medicine. It can make you more sleepy than the newer second generation antihistamine medicines. The adult dose of Benadryl is 25 to 50 mg by mouth. You can take it up to 4 times a day. * SECOND GENERATION ANTIHISTAMINES such as cetirizine, fexofenadine, and loratadine, have fewer side effects than first generation antihistamines. They tend to make you less sleepy. * CAUTION: Antihistamine medicines can make you sleepy. Do not drink alcohol, drive, or operate dangerous machines while taking this drug. It can also worsen dry eyes by decreasing natural tearing. After Care Instructions Given Call Event Type User Date / Time Description Education document email Dylan Abbott 08/04/2023 1:01:46 PM Hives Education document email Dylan Cloud, RN, Dylan Abbott 08/04/2023 1:04:07 PM Rash or Redness - Localized Referrals REFERRED TO PCP OFFICE  Will call tomorrow morning to check on symptoms

## 2023-08-04 NOTE — Telephone Encounter (Signed)
Pt thinks he is having an allergic reaction to one of his medications. He was transferred to triage

## 2023-08-06 NOTE — Progress Notes (Deleted)
   Acute Office Visit   Subjective:  Patient ID: Dylan Abbott, male    DOB: [birth year], 57 y.o.   MRN: [id number]  No chief complaint on file.   HPI Patient is a 57 year old male that presents with   ROS See HPI above      Objective:    There were no vitals taken for this visit. {Vitals History (Optional):23777}  Physical Exam  No results found for any visits on 08/07/23.      Assessment & Plan:  There are no diagnoses linked to this encounter.  No follow-ups on file.  Zandra Abts, NP

## 2023-08-07 ENCOUNTER — Ambulatory Visit
Admission: EM | Admit: 2023-08-07 | Discharge: 2023-08-07 | Disposition: A | Discharge status: Home / Self Care | Payer: 59 | Attending: Internal Medicine | Admitting: Internal Medicine

## 2023-08-07 ENCOUNTER — Ambulatory Visit: Payer: 59 | Admitting: Family Medicine

## 2023-08-07 DIAGNOSIS — T7840XA Allergy, unspecified, initial encounter: Secondary | ICD-10-CM | POA: Diagnosis not present

## 2023-08-07 LAB — POCT FASTING CBG KUC MANUAL ENTRY: POCT Glucose (KUC): 222 mg/dL — AB (ref 70–99)

## 2023-08-07 MED ORDER — METHYLPREDNISOLONE ACETATE 80 MG/ML IJ SUSP
40.0000 mg | Freq: Once | INTRAMUSCULAR | Status: AC
  Administered 2023-08-07: 40 mg via INTRAMUSCULAR

## 2023-08-07 MED ORDER — DIPHENHYDRAMINE HCL 25 MG PO TABS
25.0000 mg | ORAL_TABLET | Freq: Four times a day (QID) | ORAL | 0 refills | Status: DC | PRN
Start: 2023-08-07 — End: 2023-11-03

## 2023-08-07 NOTE — ED Triage Notes (Signed)
Pt presents to UC w/ c/o itching generalized x3 days. Pt states difficulty drinking water, painful to swallow x3 days

## 2023-08-07 NOTE — Discharge Instructions (Addendum)
You were given an injection of a steroid in the clinic.  Please monitor your blood sugars at home closely as this medication can cause your blood sugars to rise.  Please start Benadryl 1 to 2 tablets every 6 hours as needed for itching.  Please of this medication will make you drowsy.  Do not drink alcohol or drive while you are on this medication.  Contact your PCP today to review your medication list as you feel the symptoms may be related to a new medication that we were unable to identify in the clinic.  I also advise you see your PCP in person in 2 days for recheck.  Please go to the emergency room ASAP for any worsening symptoms.  This includes but is not limited to swelling of your face, lips, tongue, throat, difficulty breathing or swallowing, or any new concerns that arise.  I hope you feel better soon!

## 2023-08-07 NOTE — ED Provider Notes (Signed)
[facility]    CSN: [id number] Arrival date & time: 08/07/23  1054      History   Chief Complaint No chief complaint on file.   HPI Dylan Abbott is a 57 y.o. male presents for evaluation of generalized itching.  Patient has a complex medical history including diabetes.  patient reports 3 days of itching all over his body.  Denies any rash until he starts itching and bumps appear but these resolve once he stops itching.  He denies any facial swelling, no lip, tongue, throat swelling, difficulty breathing or shortness of breath.  States he felt it was harder to swallow water over the past few days.  Denies sore throat.  No fevers or chills.  He was seen in the ER at the end of July where he was admitted for left-sided weakness and speech abnormality.  MRI of the brain without acute findings normal CT angio of head and neck.  He was started on Plavix and aspirin for 3 weeks.  He has a neuro follow-up pending.  He had elevated troponins at that time thought to be secondary to demand ischemia, NSTEMI noted with EF of 55 to 60%.  He was seen again in the emergency room in August 1 for dizziness and fatigue and feeling off balance.  EKG without significant changes, glucose was 335 and they repeated the MRI of the brain without any acute intracranial process.  He was found to be low on sodium and potassium and has been on replacement since then he has seen his PCP since these admissions.  He thinks that his itching is secondary to new medications but he cannot state which medications are new.  I reviewed the medication list per his PCP note on August 15 and he states that others are new.  He states he thought it might be the aspirin so he stopped his aspirin 3 days ago but has had no change in symptoms.  Denies any new contacts otherwise including detergents, lotions.  Denies history of eczema or psoriasis.  He states he tried to call his PCP office but was told to go to urgent care.  No chest  pain or shortness of breath at this time.  No other concerns at this time.  HPI  Past Medical History:  Diagnosis Date   Diabetes mellitus without complication (HCC)    Hyperlipidemia    Hypertension     Patient Active Problem List   Diagnosis Date Noted   NSTEMI (non-ST elevated myocardial infarction) (HCC) 07/16/2023   Impingement syndrome of left shoulder 07/30/2020   COVID-19 virus infection 10/31/2019   Abnormal liver function 10/31/2019   Hypokalemia 10/31/2019   Hyponatremia 10/31/2019   Diabetes mellitus (HCC) 11/13/2017   Hyperlipidemia 11/13/2017   Hypertensive disorder 11/13/2017    Past Surgical History:  Procedure Laterality Date   LOOP RECORDER INSERTION N/A 07/18/2023   Procedure: LOOP RECORDER INSERTION;  Surgeon: Graciella Freer, PA-C;  Location: [facility];  Service: Cardiovascular;  Laterality: N/A;       Home Medications    Prior to Admission medications   Medication Sig Start Date End Date Taking? Authorizing Provider  diphenhydrAMINE (BENADRYL) 25 MG tablet Take 1 tablet (25 mg total) by mouth every 6 (six) hours as needed for itching. Take 1 to 2 tablets every 6 hours as needed for itching. 08/07/23  Yes Radford Pax, NP  Accu-Chek FastClix Lancets MISC USE TO CHECK BLOOD SUGAR EVERY DAY 09/08/21   Neva Seat,  Asencion Partridge, MD  aspirin EC 81 MG tablet Take 1 tablet (81 mg total) by mouth daily for 30 days, then as directed by provider. Swallow whole. 07/19/23   Burnadette Pop, MD  atorvastatin (LIPITOR) 40 MG tablet Take 1 tablet (40 mg total) by mouth at bedtime. 08/03/23   Shade Flood, MD  blood glucose meter kit and supplies Check once per day. 09/15/22   Shade Flood, MD  Blood Glucose Monitoring Suppl DEVI 1 each by Does not apply route in the morning, at noon, and at bedtime. May substitute to any manufacturer covered by patient's insurance. 07/27/23   Shade Flood, MD  clopidogrel (PLAVIX) 75 MG tablet Take 1 tablet (75 mg  total) by mouth daily for 19 days. 07/19/23 08/07/23  Burnadette Pop, MD  ezetimibe (ZETIA) 10 MG tablet Take 1 tablet (10 mg total) by mouth daily. 07/19/23   Burnadette Pop, MD  glipiZIDE (GLUCOTROL) 10 MG tablet Take 1 tablet (10 mg total) by mouth 2 (two) times daily before a meal. 08/03/23   Shade Flood, MD  glucose blood (ACCU-CHEK GUIDE) test strip USE TO CHECK BLOOD SUGAR twice per  DAY Dispense with meter of choice. 09/15/22   Shade Flood, MD  Glucose Blood (BLOOD GLUCOSE TEST STRIPS) STRP 1 each by In Vitro route in the morning, at noon, and at bedtime. May substitute to any manufacturer covered by patient's insurance. 07/27/23 08/26/23  Shade Flood, MD  insulin glargine (LANTUS) 100 UNIT/ML Solostar Pen Inject 27 Units into the skin daily. 08/03/23   Shade Flood, MD  Insulin Pen Needle (PEN NEEDLES) 32G X 4 MM MISC 1 application  by Does not apply route daily. 08/03/23   Shade Flood, MD  Insulin Pen Needle 32G X 4 MM MISC Use as directed with insulin pen. 07/18/23   Burnadette Pop, MD  Lancet Device MISC 1 each by Does not apply route in the morning, at noon, and at bedtime. May substitute to any manufacturer covered by patient's insurance. 07/27/23 08/26/23  Shade Flood, MD  Lancets Misc. MISC 1 each by Does not apply route in the morning, at noon, and at bedtime. May substitute to any manufacturer covered by patient's insurance. 07/27/23 08/26/23  Shade Flood, MD  losartan (COZAAR) 100 MG tablet Take 1 tablet (100 mg total) by mouth daily. 08/03/23   Shade Flood, MD  metFORMIN (GLUCOPHAGE) 850 MG tablet Take 1 tablet (850 mg total) by mouth 2 (two) times daily with a meal. 08/03/23   Shade Flood, MD  NIFEdipine (ADALAT CC) 90 MG 24 hr tablet Take 1 tablet (90 mg total) by mouth daily. 08/03/23   Shade Flood, MD  potassium chloride (KLOR-CON) 10 MEQ tablet Take 2 tablets (20 mEq total) by mouth daily. 08/03/23   Shade Flood, MD    Family  History History reviewed. No pertinent family history.  Social History Social History   Tobacco Use   Smoking status: Never   Smokeless tobacco: Never  Substance Use Topics   Alcohol use: Never    Alcohol/week: 0.0 standard drinks of alcohol   Drug use: Never     Allergies   Patient has no known allergies.   Review of Systems Review of Systems  Skin:  Positive for rash.     Physical Exam Triage Vital Signs ED Triage Vitals  Encounter Vitals Group     BP 08/07/23 1103 121/78     Systolic BP  Percentile --      Diastolic BP Percentile --      Pulse Rate 08/07/23 1103 83     Resp 08/07/23 1103 16     Temp 08/07/23 1103 97.8 F (36.6 C)     Temp Source 08/07/23 1103 Oral     SpO2 08/07/23 1103 97 %     Weight --      Height --      Head Circumference --      Peak Flow --      Pain Score 08/07/23 1107 2     Pain Loc --      Pain Education --      Exclude from Growth Chart --    No data found.  Updated Vital Signs BP 121/78 (BP Location: Right Arm)   Pulse 83   Temp 97.8 F (36.6 C) (Oral)   Resp 16   SpO2 97%   Visual Acuity Right Eye Distance:   Left Eye Distance:   Bilateral Distance:    Right Eye Near:   Left Eye Near:    Bilateral Near:     Physical Exam Vitals and nursing note reviewed.  Constitutional:      General: He is not in acute distress.    Appearance: Normal appearance. He is not ill-appearing, toxic-appearing or diaphoretic.     Comments: Patient speaking in complete sentences without issue  HENT:     Head: Normocephalic and atraumatic.     Comments: No facial swelling    Nose: Nose normal.     Mouth/Throat:     Mouth: Mucous membranes are moist. No angioedema.     Pharynx: Oropharynx is clear. Uvula midline. No pharyngeal swelling or uvula swelling.  Neck:     Thyroid: No thyromegaly.  Cardiovascular:     Rate and Rhythm: Normal rate and regular rhythm.     Heart sounds: Normal heart sounds.  Pulmonary:     Effort:  Pulmonary effort is normal. No respiratory distress.     Breath sounds: Normal breath sounds. No stridor. No wheezing, rhonchi or rales.  Skin:    General: Skin is warm and dry.     Comments: No visible rash on exam.   Neurological:     General: No focal deficit present.     Mental Status: He is alert and oriented to person, place, and time.  Psychiatric:        Mood and Affect: Mood normal.        Behavior: Behavior normal.      UC Treatments / Results  Labs (all labs ordered are listed, but only abnormal results are displayed) Labs Reviewed  POCT FASTING [facility]G [facility] MANUAL ENTRY - Abnormal; Notable for the following components:      Result Value   POCT Glucose ([facility]) 222 (*)    All other components within normal limits    EKG   Radiology No results found.  Procedures Procedures (including critical care time)  Medications Ordered in UC Medications  methylPREDNISolone acetate (DEPO-MEDROL) injection 40 mg (40 mg Intramuscular Given 08/07/23 1152)    Initial Impression / Assessment and Plan / UC Course  I have reviewed the triage vital signs and the nursing notes.  Pertinent labs & imaging results that were available during my care of the patient were reviewed by me and considered in my medical decision making (see chart for details).     Reviewed exam and symptoms with patient.  Patient with 3 days of generalized  pruritus.  He thinks it might be related to a new medication but cannot identify what medications are new.  He is well-appearing, no acute distress, and speaking in complete sentences without issue.  There is no visible rash on exam but patient does continue to itch legs and arms throughout visit.  Airway is patent and there are no signs of respiratory distress.  No facial or lip swelling.  Will do one-time injection of Depo-Medrol and will start oral Benadryl.  Patient did not want to take Benadryl while in the clinic due to drowsiness but will start once he gets  home.  Patient was monitored 10 minutes after injection with no reaction noted and tolerated well.  He will monitor his blood sugars closely given the Depo-Medrol injection.  Will hold off on oral prednisone given his history of diabetes.  I advise he contact his PCP today to review his medication list as he is not sure what medications are new that could be causing the symptoms.  Given his medical history I do not feel comfortable stopping any of these medications and advised he needs to speak with his PCP about this.  Advised PCP follow-up in 2 days for recheck.  Strict ER precautions reviewed and patient verbalized understanding. Final Clinical Impressions(s) / UC Diagnoses   Final diagnoses:  Allergic reaction, initial encounter     Discharge Instructions      You were given an injection of a steroid in the clinic.  Please monitor your blood sugars at home closely as this medication can cause your blood sugars to rise.  Please start Benadryl 1 to 2 tablets every 6 hours as needed for itching.  Please of this medication will make you drowsy.  Do not drink alcohol or drive while you are on this medication.  Contact your PCP today to review your medication list as you feel the symptoms may be related to a new medication that we were unable to identify in the clinic.  I also advise you see your PCP in person in 2 days for recheck.  Please go to the emergency room ASAP for any worsening symptoms.  This includes but is not limited to swelling of your face, lips, tongue, throat, difficulty breathing or swallowing, or any new concerns that arise.  I hope you feel better soon!     ED Prescriptions     Medication Sig Dispense Auth. Provider   diphenhydrAMINE (BENADRYL) 25 MG tablet Take 1 tablet (25 mg total) by mouth every 6 (six) hours as needed for itching. Take 1 to 2 tablets every 6 hours as needed for itching. 15 tablet Radford Pax, NP      PDMP not reviewed this encounter.   Radford Pax, NP 08/07/23 [phone]

## 2023-08-09 ENCOUNTER — Ambulatory Visit (INDEPENDENT_AMBULATORY_CARE_PROVIDER_SITE_OTHER): Payer: 59 | Admitting: Family Medicine

## 2023-08-09 VITALS — BP 108/70 | HR 78 | Temp 98.5°F | Ht 72.0 in | Wt 219.4 lb

## 2023-08-09 DIAGNOSIS — T7840XD Allergy, unspecified, subsequent encounter: Secondary | ICD-10-CM

## 2023-08-09 DIAGNOSIS — E1165 Type 2 diabetes mellitus with hyperglycemia: Secondary | ICD-10-CM | POA: Diagnosis not present

## 2023-08-09 DIAGNOSIS — Z7984 Long term (current) use of oral hypoglycemic drugs: Secondary | ICD-10-CM | POA: Diagnosis not present

## 2023-08-09 DIAGNOSIS — L299 Pruritus, unspecified: Secondary | ICD-10-CM | POA: Diagnosis not present

## 2023-08-09 LAB — GLUCOSE, POCT (MANUAL RESULT ENTRY): POC Glucose: 218 mg/dl — AB (ref 70–99)

## 2023-08-09 MED ORDER — CETIRIZINE HCL 10 MG PO TABS
10.0000 mg | ORAL_TABLET | Freq: Every day | ORAL | 0 refills | Status: DC
Start: 2023-08-09 — End: 2024-02-08

## 2023-08-09 NOTE — Patient Instructions (Addendum)
With aspirin as the only new med aspirin, that could be cause of the itching. Since you are improving, can stay on Aspirin for now but we may need to stop it if itching does not resolve in the next few days. I will refer you to an allergist to decide if allergy testing needed.  Start zyrtec once per day for now, and Aveeno lotion may help with itching. Hydrocortisone cream can be applied to areas of itching as well.   Recheck in 5 days. Return to the clinic or go to the nearest emergency room if any of your symptoms worsen or new symptoms occur -including if any change in swallowing.  We will call you later today to determine the dose of your insulin.  Blood sugar is too high and we will need to adjust that dose but I need to know what you are currently taking.  Bring all your medicines and a copy of your blood sugars to your next visit.

## 2023-08-09 NOTE — Progress Notes (Signed)
Subjective:  Patient ID: Dylan Abbott, male    DOB: 12/25/1965  Age: 57 y.o. MRN: [id number]  CC:  Chief Complaint  Patient presents with   Hospitalization Follow-up    Pt notes went to Urgent care for itching, notes got an injection there but the itching has not gone away, pt notes started Thursday after his appointment here     HPI Dylan Abbott presents for   Allergic reaction Urgent care evaluation on August 19.  Generalized itching at that time for 3 days.  Did note some difficulty/harder to swallow over the previous few days but no throat or tongue swelling and able to clear secretions.  Question of reaction to one of his new medications, he stopped aspirin 3 days prior.  No visible rash on exam and no facial swelling. He was given methylprednisolone injection 40 mg x 1, and recommended starting oral Benadryl.  Advised to monitor blood sugar closely with Depo-Medrol injection given his history of diabetes.  No known specific medication cause, recommended discussion with me.  Still itching all over - whole body.  No rash until scratches. No oral or genital rash.  Improving since urgent care visit, but still there - still some difficulty swallowing, but improving. Sore to swallow.  No fever.  No cough/congestion/runny nose or dyspnea.  Just started benadryl yesterday - 2 pills twice yesterday. None yet today as driving.  Only new med recently was Aspirin - taken since possible TIA. Restarted ASA yesterday.  No prior new lotions/derm products. No new foods.   Blood sugar 202 a few days ago.  On lantus insulin - unknown dose. Taking insulin daily. (Dosage increases discussed at last visit on 8/15).  Glipizide 10mg  BID Metformin 850mg  BID.        History Patient Active Problem List   Diagnosis Date Noted   NSTEMI (non-ST elevated myocardial infarction) (HCC) 07/16/2023   Impingement syndrome of left shoulder 07/30/2020   COVID-19 virus infection 10/31/2019    Abnormal liver function 10/31/2019   Hypokalemia 10/31/2019   Hyponatremia 10/31/2019   Diabetes mellitus (HCC) 11/13/2017   Hyperlipidemia 11/13/2017   Hypertensive disorder 11/13/2017   Past Medical History:  Diagnosis Date   Diabetes mellitus without complication (HCC)    Hyperlipidemia    Hypertension    Past Surgical History:  Procedure Laterality Date   LOOP RECORDER INSERTION N/A 07/18/2023   Procedure: LOOP RECORDER INSERTION;  Surgeon: Graciella Freer, PA-C;  Location: [facility];  Service: Cardiovascular;  Laterality: N/A;   No Known Allergies Prior to Admission medications   Medication Sig Start Date End Date Taking? Authorizing Provider  Accu-Chek FastClix Lancets MISC USE TO CHECK BLOOD SUGAR EVERY DAY 09/08/21  Yes Shade Flood, MD  aspirin EC 81 MG tablet Take 1 tablet (81 mg total) by mouth daily for 30 days, then as directed by provider. Swallow whole. 07/19/23  Yes Burnadette Pop, MD  atorvastatin (LIPITOR) 40 MG tablet Take 1 tablet (40 mg total) by mouth at bedtime. 08/03/23  Yes Shade Flood, MD  blood glucose meter kit and supplies Check once per day. 09/15/22  Yes Shade Flood, MD  Blood Glucose Monitoring Suppl DEVI 1 each by Does not apply route in the morning, at noon, and at bedtime. May substitute to any manufacturer covered by patient's insurance. 07/27/23  Yes Shade Flood, MD  diphenhydrAMINE (BENADRYL) 25 MG tablet Take 1 tablet (25 mg total) by mouth every 6 (six) hours as needed  for itching. Take 1 to 2 tablets every 6 hours as needed for itching. 08/07/23  Yes Radford Pax, NP  ezetimibe (ZETIA) 10 MG tablet Take 1 tablet (10 mg total) by mouth daily. 07/19/23  Yes Burnadette Pop, MD  glipiZIDE (GLUCOTROL) 10 MG tablet Take 1 tablet (10 mg total) by mouth 2 (two) times daily before a meal. 08/03/23  Yes Shade Flood, MD  glucose blood (ACCU-CHEK GUIDE) test strip USE TO CHECK BLOOD SUGAR twice per  DAY Dispense with  meter of choice. 09/15/22  Yes Shade Flood, MD  Glucose Blood (BLOOD GLUCOSE TEST STRIPS) STRP 1 each by In Vitro route in the morning, at noon, and at bedtime. May substitute to any manufacturer covered by patient's insurance. 07/27/23 08/26/23 Yes Shade Flood, MD  insulin glargine (LANTUS) 100 UNIT/ML Solostar Pen Inject 27 Units into the skin daily. 08/03/23  Yes Shade Flood, MD  Insulin Pen Needle (PEN NEEDLES) 32G X 4 MM MISC 1 application  by Does not apply route daily. 08/03/23  Yes Shade Flood, MD  Insulin Pen Needle 32G X 4 MM MISC Use as directed with insulin pen. 07/18/23  Yes Burnadette Pop, MD  Lancet Device MISC 1 each by Does not apply route in the morning, at noon, and at bedtime. May substitute to any manufacturer covered by patient's insurance. 07/27/23 08/26/23 Yes Shade Flood, MD  Lancets Misc. MISC 1 each by Does not apply route in the morning, at noon, and at bedtime. May substitute to any manufacturer covered by patient's insurance. 07/27/23 08/26/23 Yes Shade Flood, MD  losartan (COZAAR) 100 MG tablet Take 1 tablet (100 mg total) by mouth daily. 08/03/23  Yes Shade Flood, MD  metFORMIN (GLUCOPHAGE) 850 MG tablet Take 1 tablet (850 mg total) by mouth 2 (two) times daily with a meal. 08/03/23  Yes Shade Flood, MD  NIFEdipine (ADALAT CC) 90 MG 24 hr tablet Take 1 tablet (90 mg total) by mouth daily. 08/03/23  Yes Shade Flood, MD  potassium chloride (KLOR-CON) 10 MEQ tablet Take 2 tablets (20 mEq total) by mouth daily. 08/03/23  Yes Shade Flood, MD   Social History   Socioeconomic History   Marital status: Married    Spouse name: Not on file   Number of children: 2   Years of education: Not on file   Highest education level: Not on file  Occupational History   Occupation: environmental  Tobacco Use   Smoking status: Never   Smokeless tobacco: Never  Substance and Sexual Activity   Alcohol use: Never    Alcohol/week: 0.0 standard  drinks of alcohol   Drug use: Never   Sexual activity: Yes  Other Topics Concern   Not on file  Social History Narrative   Not on file   Social Determinants of Health   Financial Resource Strain: Not on file  Food Insecurity: No Food Insecurity (07/17/2023)   Hunger Vital Sign    Worried About Running Out of Food in the Last Year: Never true    Ran Out of Food in the Last Year: Never true  Transportation Needs: No Transportation Needs (07/17/2023)   PRAPARE - Administrator, Civil Service (Medical): No    Lack of Transportation (Non-Medical): No  Physical Activity: Not on file  Stress: Not on file  Social Connections: Not on file  Intimate Partner Violence: Not At Risk (07/17/2023)   Humiliation, Afraid, Rape, and Kick  questionnaire    Fear of Current or Ex-Partner: No    Emotionally Abused: No    Physically Abused: No    Sexually Abused: No    Review of Systems   Objective:   Vitals:   08/09/23 1353  BP: 108/70  Pulse: 78  Temp: 98.5 F (36.9 C)  TempSrc: Temporal  SpO2: 97%  Weight: 219 lb 6.4 oz (99.5 kg)  Height: 6' (1.829 m)     Physical Exam Vitals reviewed.  Constitutional:      Appearance: He is well-developed.  HENT:     Head: Normocephalic and atraumatic.     Mouth/Throat:     Mouth: Mucous membranes are moist.     Pharynx: Oropharynx is clear.     Comments: No oral lesions, clearing secretions normally, posterior oropharynx appears clear, no appreciable swelling.  Normal speech, no stridor. Neck:     Vascular: No carotid bruit or JVD.  Cardiovascular:     Rate and Rhythm: Normal rate and regular rhythm.     Heart sounds: Normal heart sounds. No murmur heard. Pulmonary:     Effort: Pulmonary effort is normal. No respiratory distress.     Breath sounds: Normal breath sounds. No stridor. No wheezing, rhonchi or rales.  Musculoskeletal:     Right lower leg: No edema.     Left lower leg: No edema.  Skin:    General: Skin is warm and  dry.     Comments: No visible rash.  Neurological:     Mental Status: He is alert and oriented to person, place, and time.  Psychiatric:        Mood and Affect: Mood normal.     Glucose 218 in office.    Assessment & Plan:  Phenix Sunshine is a 57 y.o. male . Allergic reaction, subsequent encounter - Plan: Ambulatory referral to Allergy, cetirizine (ZYRTEC) 10 MG tablet Pruritus - Plan: Ambulatory referral to Allergy, cetirizine (ZYRTEC) 10 MG tablet  -Suspect allergic reaction with diffuse pruritus, improving.  Unsure if aspirin as improving on meds.  Decided to remain on aspirin initially with plan to stop aspirin if itching/symptoms were not continuing to improve.  Referral to allergist, start Zyrtec, topical Aveeno and hydrocortisone cream if needed for specific pruritic areas, with ER/RTC precautions given.  Type 2 diabetes mellitus with hyperglycemia, without long-term current use of insulin (HCC) - Plan: POCT glucose (manual entry) Uncontrolled with hyperglycemia.  Unfortunately he was unsure of his dose of insulin although he was taking it daily.  Was continued on glipizide 10 mg twice daily, metformin and 50 mg twice daily, and plan for staff to call patient to verify his meds once he got home.  Advised to bring all his medications and copy of home readings to follow-up visit in 1 week.  Meds ordered this encounter  Medications   cetirizine (ZYRTEC) 10 MG tablet    Sig: Take 1 tablet (10 mg total) by mouth daily.    Dispense:  30 tablet    Refill:  0   Patient Instructions  With aspirin as the only new med aspirin, that could be cause of the itching. Since you are improving, can stay on Aspirin for now but we may need to stop it if itching does not resolve in the next few days. I will refer you to an allergist to decide if allergy testing needed.  Start zyrtec once per day for now, and Aveeno lotion may help with itching. Hydrocortisone cream can be applied  to areas of itching  as well.   Recheck in 5 days. Return to the clinic or go to the nearest emergency room if any of your symptoms worsen or new symptoms occur -including if any change in swallowing.  We will call you later today to determine the dose of your insulin.  Blood sugar is too high and we will need to adjust that dose but I need to know what you are currently taking.  Bring all your medicines and a copy of your blood sugars to your next visit.     Signed,   Meredith Staggers, MD [facility], [facility] 08/09/23 3:06 PM

## 2023-08-16 ENCOUNTER — Telehealth: Payer: 59 | Admitting: Family Medicine

## 2023-08-16 ENCOUNTER — Encounter: Payer: Self-pay | Admitting: Family Medicine

## 2023-08-16 ENCOUNTER — Ambulatory Visit: Payer: 59 | Admitting: Family Medicine

## 2023-08-16 VITALS — BP 118/66 | HR 83 | Temp 98.6°F | Ht 72.0 in | Wt 222.4 lb

## 2023-08-16 DIAGNOSIS — L299 Pruritus, unspecified: Secondary | ICD-10-CM

## 2023-08-16 DIAGNOSIS — T7840XD Allergy, unspecified, subsequent encounter: Secondary | ICD-10-CM

## 2023-08-16 DIAGNOSIS — Z794 Long term (current) use of insulin: Secondary | ICD-10-CM

## 2023-08-16 DIAGNOSIS — E1165 Type 2 diabetes mellitus with hyperglycemia: Secondary | ICD-10-CM

## 2023-08-16 DIAGNOSIS — R1319 Other dysphagia: Secondary | ICD-10-CM

## 2023-08-16 MED ORDER — OMEPRAZOLE 20 MG PO CPDR: 20.0000 mg | DELAYED_RELEASE_CAPSULE | Freq: Every day | ORAL | 0 refills | Status: DC

## 2023-08-16 NOTE — Progress Notes (Signed)
Subjective:  Patient ID: Dylan Abbott, male    DOB: 19-Feb-1966  Age: 57 y.o. MRN: [id number]  CC:  Chief Complaint  Patient presents with   Diabetes    Pt notes sugar is still high pt is pretty sure he takes 25 units of insulin daily      HPI Dylan Abbott presents for   Diabetes: Complicated by hyperglycemia, question of medication adherence and difficulty with known specific regimen, and possible CBD with recent suspected TIA.  Slightly elevated A1c of 8.1 in December with 63-month follow-up planned.  He had been off Trulicity when seen in the hospital for his suspected TIA and transient speech abnormality and left-sided weakness.  Worsening control with A1c at 12.2.  He was started on Lantus 25 units daily in addition to his usual glipizide 10 mg twice daily and metformin 850 mg twice daily.  Hyperglycemia symptoms including increased thirst, urinary frequency and blurry vision prior few months.  He is on statin with Lipitor and Zetia for hyperlipidemia.   ARB with losartan. New meter was ordered at his August 8 visit for home monitoring.  Glucose 349 at his August 8 visit, advised to increase Lantus to 27 units/day with close follow-up to decide on further changes. He was seen at urgent care on August 19 for allergic reaction and Depo-Medrol injection 40 mg given at that time.  Unknown specific dose of insulin at his August 21 visit but he did report compliance with glipizide and metformin twice daily.  Here for close follow-up with home readings and his home meds for clarification.  Fasting: not checking fasting readings.  Postprandial: 202, 230,  Lowest reading: 160.  Did not bring insulin but did bring other meds. Clarified with patient in office today with demonstration pen - he has only been injecting 2 units per night. Misunderstanding on dosing.    Lab Results  Component Value Date   HGBA1C 12.2 (H) 07/18/2023   HGBA1C 12.1 (H) 07/18/2023   HGBA1C 8.1 (H) 12/16/2022    Lab Results  Component Value Date   MICROALBUR 1.4 11/16/2022   LDLCALC 87 07/18/2023   CREATININE 1.22 07/27/2023   Dysphagia: Noted feeling of food getting stuck in upper chest since allergic reaction. No regurgitation or choking. Helps to swallow water. No further itching or rash.  No heartburn.   Home meds reviewed. On losartan 125mg  every day - advised to stop the 25mg  Has rx for both simvastatin 80mg  and lipitor 40mg  - taking both - advised to just take lipitor.  Continued on potassium 10mg  2 per day, cetirizine, metformin, glipizide.  History Patient Active Problem List   Diagnosis Date Noted   NSTEMI (non-ST elevated myocardial infarction) (HCC) 07/16/2023   Impingement syndrome of left shoulder 07/30/2020   COVID-19 virus infection 10/31/2019   Abnormal liver function 10/31/2019   Hypokalemia 10/31/2019   Hyponatremia 10/31/2019   Diabetes mellitus (HCC) 11/13/2017   Hyperlipidemia 11/13/2017   Hypertensive disorder 11/13/2017   Past Medical History:  Diagnosis Date   Diabetes mellitus without complication (HCC)    Hyperlipidemia    Hypertension    Past Surgical History:  Procedure Laterality Date   LOOP RECORDER INSERTION N/A 07/18/2023   Procedure: LOOP RECORDER INSERTION;  Surgeon: Graciella Freer, PA-C;  Location: [facility];  Service: Cardiovascular;  Laterality: N/A;   No Known Allergies Prior to Admission medications   Medication Sig Start Date End Date Taking? Authorizing Provider  Accu-Chek FastClix Lancets MISC USE TO CHECK  BLOOD SUGAR EVERY DAY 09/08/21  Yes Shade Flood, MD  aspirin EC 81 MG tablet Take 1 tablet (81 mg total) by mouth daily for 30 days, then as directed by provider. Swallow whole. 07/19/23  Yes Burnadette Pop, MD  atorvastatin (LIPITOR) 40 MG tablet Take 1 tablet (40 mg total) by mouth at bedtime. 08/03/23  Yes Shade Flood, MD  Blood Glucose Monitoring Suppl DEVI 1 each by Does not apply route in the morning,  at noon, and at bedtime. May substitute to any manufacturer covered by patient's insurance. 07/27/23  Yes Shade Flood, MD  cetirizine (ZYRTEC) 10 MG tablet Take 1 tablet (10 mg total) by mouth daily. 08/09/23  Yes Shade Flood, MD  glipiZIDE (GLUCOTROL) 10 MG tablet Take 1 tablet (10 mg total) by mouth 2 (two) times daily before a meal. 08/03/23  Yes Shade Flood, MD  Glucose Blood (BLOOD GLUCOSE TEST STRIPS) STRP 1 each by In Vitro route in the morning, at noon, and at bedtime. May substitute to any manufacturer covered by patient's insurance. 07/27/23 08/26/23 Yes Shade Flood, MD  insulin glargine (LANTUS) 100 UNIT/ML Solostar Pen Inject 27 Units into the skin daily. 08/03/23  Yes Shade Flood, MD  Insulin Pen Needle (PEN NEEDLES) 32G X 4 MM MISC 1 application  by Does not apply route daily. 08/03/23  Yes Shade Flood, MD  Lancet Device MISC 1 each by Does not apply route in the morning, at noon, and at bedtime. May substitute to any manufacturer covered by patient's insurance. 07/27/23 08/26/23 Yes Shade Flood, MD  losartan (COZAAR) 100 MG tablet Take 1 tablet (100 mg total) by mouth daily. Patient taking differently: Take 100 mg by mouth daily. Taking 125 mg at this time after urgent care visit 08/03/23  Yes Shade Flood, MD  losartan (COZAAR) 25 MG tablet Take 25 mg by mouth daily.   Yes [provider]  metFORMIN (GLUCOPHAGE) 850 MG tablet Take 1 tablet (850 mg total) by mouth 2 (two) times daily with a meal. 08/03/23  Yes Shade Flood, MD  potassium chloride (KLOR-CON) 10 MEQ tablet Take 2 tablets (20 mEq total) by mouth daily. 08/03/23  Yes Shade Flood, MD  simvastatin (ZOCOR) 80 MG tablet Take 80 mg by mouth daily.   Yes [provider]  diphenhydrAMINE (BENADRYL) 25 MG tablet Take 1 tablet (25 mg total) by mouth every 6 (six) hours as needed for itching. Take 1 to 2 tablets every 6 hours as needed for itching. Patient not taking: Reported  on 08/16/2023 08/07/23   Radford Pax, NP  ezetimibe (ZETIA) 10 MG tablet Take 1 tablet (10 mg total) by mouth daily. Patient not taking: Reported on 08/16/2023 07/19/23   Burnadette Pop, MD  NIFEdipine (ADALAT CC) 90 MG 24 hr tablet Take 1 tablet (90 mg total) by mouth daily. Patient not taking: Reported on 08/16/2023 08/03/23   Shade Flood, MD   Social History   Socioeconomic History   Marital status: Married    Spouse name: Not on file   Number of children: 2   Years of education: Not on file   Highest education level: Not on file  Occupational History   Occupation: environmental  Tobacco Use   Smoking status: Never   Smokeless tobacco: Never  Substance and Sexual Activity   Alcohol use: Never    Alcohol/week: 0.0 standard drinks of alcohol   Drug use: Never   Sexual activity:  Yes  Other Topics Concern   Not on file  Social History Narrative   Not on file   Social Determinants of Health   Financial Resource Strain: Not on file  Food Insecurity: No Food Insecurity (07/17/2023)   Hunger Vital Sign    Worried About Running Out of Food in the Last Year: Never true    Ran Out of Food in the Last Year: Never true  Transportation Needs: No Transportation Needs (07/17/2023)   PRAPARE - Administrator, Civil Service (Medical): No    Lack of Transportation (Non-Medical): No  Physical Activity: Not on file  Stress: Not on file  Social Connections: Not on file  Intimate Partner Violence: Not At Risk (07/17/2023)   Humiliation, Afraid, Rape, and Kick questionnaire    Fear of Current or Ex-Partner: No    Emotionally Abused: No    Physically Abused: No    Sexually Abused: No    Review of Systems Per HPI.   Objective:   Vitals:   08/16/23 1525  BP: 118/66  Pulse: 83  Temp: 98.6 F (37 C)  TempSrc: Temporal  SpO2: 98%  Weight: 222 lb 6.4 oz (100.9 kg)  Height: 6' (1.829 m)     Physical Exam Vitals reviewed.  Constitutional:      Appearance: He is  well-developed.  HENT:     Head: Normocephalic and atraumatic.  Neck:     Vascular: No carotid bruit or JVD.  Cardiovascular:     Rate and Rhythm: Normal rate and regular rhythm.     Heart sounds: Normal heart sounds. No murmur heard. Pulmonary:     Effort: Pulmonary effort is normal.     Breath sounds: Normal breath sounds. No rales.  Musculoskeletal:     Right lower leg: No edema.     Left lower leg: No edema.  Skin:    General: Skin is warm and dry.     Findings: No rash.  Neurological:     Mental Status: He is alert and oriented to person, place, and time.  Psychiatric:        Mood and Affect: Mood normal.        Assessment & Plan:  Dylan Abbott is a 57 y.o. male . Type 2 diabetes mellitus with hyperglycemia, with long-term current use of insulin (HCC)  -Misunderstanding regarding insulin dosing.  Demonstration pen in office used to review dosing and will initially start at 10 units/day, follow-up in 1 week for further adjustments.  Allergic reaction, subsequent encounter Pruritus  -Improved, RTC/ER precautions  Esophageal dysphagia - Plan: Ambulatory referral to Gastroenterology  -Start PPI for possible reflux component and refer to gastroenterology.  ER precautions.  No orders of the defined types were placed in this encounter.  Patient Instructions  Your blood sugars are high because you are not using enough insulin. For now use 10 units insulin per night and recheck in 1 week.  Check blood sugar fasting (after waking up and no recent meal)  AND 2 hours after meal, and write those numbers down to bring to next visit.   I will refer you to specialist for the feeling of food getting stuck.  Make sure to chew food thoroughly, see info below. Try starting acid blocker med to see if that helps.   Okay to stop Zyrtec after current bottle is done as long as the itching does not come back.  Return to the clinic or go to the nearest emergency room if any  of your  symptoms worsen or new symptoms occur.      Signed,   Meredith Staggers, MD [facility], [facility] 08/16/23 3:40 PM

## 2023-08-16 NOTE — Patient Instructions (Addendum)
Your blood sugars are high because you are not using enough insulin. For now use 10 units insulin per night and recheck in 1 week.  Check blood sugar fasting (after waking up and no recent meal)  AND 2 hours after meal, and write those numbers down to bring to next visit.   I will refer you to specialist for the feeling of food getting stuck.  Make sure to chew food thoroughly, see info below. Try starting acid blocker med to see if that helps.   Okay to stop Zyrtec after current bottle is done as long as the itching does not come back.  Return to the clinic or go to the nearest emergency room if any of your symptoms worsen or new symptoms occur.

## 2023-08-17 ENCOUNTER — Encounter: Payer: Self-pay | Admitting: Gastroenterology

## 2023-08-19 ENCOUNTER — Encounter: Payer: Self-pay | Admitting: Family Medicine

## 2023-08-22 ENCOUNTER — Ambulatory Visit (INDEPENDENT_AMBULATORY_CARE_PROVIDER_SITE_OTHER): Payer: 59

## 2023-08-22 DIAGNOSIS — I639 Cerebral infarction, unspecified: Secondary | ICD-10-CM | POA: Diagnosis not present

## 2023-08-22 LAB — CUP PACEART REMOTE DEVICE CHECK
Date Time Interrogation Session: 20240902235416
Implantable Pulse Generator Implant Date: 20240730

## 2023-08-22 NOTE — Progress Notes (Signed)
[facility] [address] [place]. [place] [address] [phone]       HOSPITAL FOLLOW UP NOTE  Mr. Dylan Abbott Date of Birth:  [birth year] Medical Record Number:  [id number]   Reason for Referral:  hospital stroke follow up    SUBJECTIVE:   CHIEF COMPLAINT:  Chief Complaint  Patient presents with   Follow-up    Pt alone, rm 8. Presents today as follow up from ER visit in July/Aug 1. States he no longer has dizziness and feels like back to baseline. PCP has been working on get better BP control.    HPI:   Mr. Dylan Abbott is a 57 y.o. male with history of hypertension, hyperlipidemia and diabetes who presented to ED on 07/16/2023 with chest pain and two episodes of transient aphasia.  Noted to be hypertensive upon arrival.  Found to have non-STEMI and AKI.  MRI brain no acute abnormality. CTA head/neck unremarkable.  Felt likely left hemispheric TIA with 2 episodes of transient aphasia, recommended ILR to evaluate for A-fib, placed on 7/30. Recommended DAPT for 3 weeks and aspirin alone.  LDL 87 -changed home simvastatin to atorvastatin and added Zetia.  A1c 12.2, has close PCP follow-up for better DM control.  Discharged home without therapy needs.  Returned to ED on 8/1 with vertigo. MRI no acute abnormality.  Lab work showed mild hyponatremia 134, hypokalemia 3.0 and glucose 308. Discharged home.    Today, 08/23/2023, patient is being seen for initial hospital follow-up unaccompanied.  Reports doing well from a neurological standpoint, denies any new or reoccurring stroke/TIA symptoms.  Has returned back to all prior activities.  Completed 3 weeks DAPT, remains on aspirin alone as well as atorvastatin and Zetia.  Blood pressure today elevated even on repeat, asymptomatic, does not routinely monitor at home.  Recent visit with PCP showed satisfactory BP.  Follows closely with PCP for stroke risk factor management and working on better DM control.  Loop recorder  has not shown atrial fibrillation thus far.  No questions or concerns at this time.     PERTINENT IMAGING  Hospitalization 07/16/2023 CT head No acute abnormality.  CTA head & neck no LVO or hemodynamically significant stenosis MRI no acute abnormality, chronic small vessel ischemic changes 2D Echo EF 55 to 60%, mild concentric LVH, grade 1 diastolic dysfunction, normal left atrial size, no atrial level shunt S/p ILR 7/30 LDL 87 HgbA1c 12.2  ED eval 07/20/2023 MR brain no acute abnormality     ROS:   14 system review of systems performed and negative with exception of those listed in HPI  PMH:  Past Medical History:  Diagnosis Date   Diabetes mellitus without complication (HCC)    Hyperlipidemia    Hypertension     PSH:  Past Surgical History:  Procedure Laterality Date   LOOP RECORDER INSERTION N/A 07/18/2023   Procedure: LOOP RECORDER INSERTION;  Surgeon: Graciella Freer, PA-C;  Location: [facility];  Service: Cardiovascular;  Laterality: N/A;    Social History:  Social History   Socioeconomic History   Marital status: Married    Spouse name: Not on file   Number of children: 2   Years of education: Not on file   Highest education level: Not on file  Occupational History   Occupation: environmental  Tobacco Use   Smoking status: Never   Smokeless tobacco: Never  Substance and Sexual Activity   Alcohol use: Never    Alcohol/week: 0.0 standard drinks of  alcohol   Drug use: Never   Sexual activity: Yes  Other Topics Concern   Not on file  Social History Narrative   Not on file   Social Determinants of Health   Financial Resource Strain: Not on file  Food Insecurity: No Food Insecurity (07/17/2023)   Hunger Vital Sign    Worried About Running Out of Food in the Last Year: Never true    Ran Out of Food in the Last Year: Never true  Transportation Needs: No Transportation Needs (07/17/2023)   PRAPARE - Scientist, research (physical sciences) (Medical): No    Lack of Transportation (Non-Medical): No  Physical Activity: Not on file  Stress: Not on file  Social Connections: Not on file  Intimate Partner Violence: Not At Risk (07/17/2023)   Humiliation, Afraid, Rape, and Kick questionnaire    Fear of Current or Ex-Partner: No    Emotionally Abused: No    Physically Abused: No    Sexually Abused: No    Family History: No family history on file.  Medications:   Current Outpatient Medications on File Prior to Visit  Medication Sig Dispense Refill   Accu-Chek FastClix Lancets MISC USE TO CHECK BLOOD SUGAR EVERY DAY 102 each 0   aspirin EC 81 MG tablet Take 1 tablet (81 mg total) by mouth daily for 30 days, then as directed by provider. Swallow whole. 120 tablet 1   atorvastatin (LIPITOR) 40 MG tablet Take 1 tablet (40 mg total) by mouth at bedtime. 90 tablet 1   Blood Glucose Monitoring Suppl DEVI 1 each by Does not apply route in the morning, at noon, and at bedtime. May substitute to any manufacturer covered by patient's insurance. 1 each 0   cetirizine (ZYRTEC) 10 MG tablet Take 1 tablet (10 mg total) by mouth daily. 30 tablet 0   diphenhydrAMINE (BENADRYL) 25 MG tablet Take 1 tablet (25 mg total) by mouth every 6 (six) hours as needed for itching. Take 1 to 2 tablets every 6 hours as needed for itching. 15 tablet 0   ezetimibe (ZETIA) 10 MG tablet Take 1 tablet (10 mg total) by mouth daily. 30 tablet 1   glipiZIDE (GLUCOTROL) 10 MG tablet Take 1 tablet (10 mg total) by mouth 2 (two) times daily before a meal. 180 tablet 1   Glucose Blood (BLOOD GLUCOSE TEST STRIPS) STRP 1 each by In Vitro route in the morning, at noon, and at bedtime. May substitute to any manufacturer covered by patient's insurance. 100 strip 0   insulin glargine (LANTUS) 100 UNIT/ML Solostar Pen Inject 27 Units into the skin daily. 15 mL 0   Insulin Pen Needle (PEN NEEDLES) 32G X 4 MM MISC 1 application  by Does not apply route daily. 90 each 3    Lancet Device MISC 1 each by Does not apply route in the morning, at noon, and at bedtime. May substitute to any manufacturer covered by patient's insurance. 1 each 0   losartan (COZAAR) 100 MG tablet Take 1 tablet (100 mg total) by mouth daily. (Patient taking differently: Take 100 mg by mouth daily. Taking 125 mg at this time after urgent care visit) 90 tablet 1   losartan (COZAAR) 25 MG tablet Take 25 mg by mouth daily.     metFORMIN (GLUCOPHAGE) 850 MG tablet Take 1 tablet (850 mg total) by mouth 2 (two) times daily with a meal. 180 tablet 1   NIFEdipine (ADALAT CC) 90 MG 24 hr tablet Take 1  tablet (90 mg total) by mouth daily. 180 tablet 1   omeprazole (PRILOSEC) 20 MG capsule Take 1 capsule (20 mg total) by mouth daily. 30 capsule 0   potassium chloride (KLOR-CON) 10 MEQ tablet Take 2 tablets (20 mEq total) by mouth daily. 180 tablet 1   simvastatin (ZOCOR) 80 MG tablet Take 80 mg by mouth daily.     No current facility-administered medications on file prior to visit.    Allergies:  No Known Allergies    OBJECTIVE:  Physical Exam  Vitals:   08/23/23 1013  BP: (!) 164/94  Pulse: 65  Weight: 230 lb (104.3 kg)  Height: 6' (1.829 m)   Body mass index is 31.19 kg/m. No results found.   General: well developed, well nourished, pleasant middle-age male, seated, in no evident distress Head: head normocephalic and atraumatic.   Neck: supple with no carotid or supraclavicular bruits Cardiovascular: regular rate and rhythm, no murmurs Musculoskeletal: no deformity Skin:  no rash/petichiae Vascular:  Normal pulses all extremities   Neurologic Exam Mental Status: Awake and fully alert.  Fluent speech and language.  Oriented to place and time. Recent and remote memory intact. Attention span, concentration and fund of knowledge appropriate. Mood and affect appropriate.  Cranial Nerves: Fundoscopic exam reveals sharp disc margins. Pupils equal, briskly reactive to light. Extraocular  movements full without nystagmus. Visual fields full to confrontation. Hearing intact. Facial sensation intact. Face, tongue, palate moves normally and symmetrically.  Motor: Normal bulk and tone. Normal strength in all tested extremity muscles Sensory.: intact to touch , pinprick , position and vibratory sensation.  Coordination: Rapid alternating movements normal in all extremities. Finger-to-nose and heel-to-shin performed accurately bilaterally. Gait and Station: Arises from chair without difficulty. Stance is normal. Gait demonstrates normal stride length and balance without use of AD. Tandem walk and heel toe without difficulty.  Reflexes: 1+ and symmetric. Toes downgoing.     NIHSS  0 Modified Rankin  0      ASSESSMENT: Doug Maslo is a 57 y.o. year old male with likely TIA on 07/16/2023  after 2 transient episodes of expressive aphasia s/p ILR. Vascular risk factors include HTN, HLD, uncontrolled DM, non-STEMI 06/2023  and obesity.      PLAN:  TIA:  Continue aspirin 81mg  daily and atorvastatin (Lipitor) and Zetia 10mg  daily for secondary stroke prevention. Loop recorder has not shown atrial fibrillation thus far -monitored by cardiology Encouraged monitoring blood pressure at home, elevated today although asymptomatic.  Discussed potential warning signs and to call 911 immediately if any should occur Discussed secondary stroke prevention measures and importance of close PCP follow up for aggressive stroke risk factor management including BP goal<130/90, HLD with LDL goal<70 and DM with A1c.<7 .  Stroke labs 06/2023: LDL 87, A1c 12.2 I have gone over the pathophysiology of stroke, warning signs and symptoms, risk factors and their management in some detail with instructions to go to the closest emergency room for symptoms of concern.    Doing well from stroke standpoint without further recommendations and risk factors are managed by PCP. He may follow up PRN, as usual for our  patients who are strictly being followed for stroke. If any new neurological issues should arise, request PCP place referral for evaluation by one of our neurologists. Thank you.     CC:  [facility] provider: Dr. Pearlean Brownie PCP: Shade Flood, MD    I spent 50 minutes of face-to-face and non-face-to-face time with patient.  This included previsit  chart review including review of recent hospitalization, lab review, study review, order entry, electronic health record documentation, patient education and discussion regarding above diagnoses and treatment plan and answered all other questions to patient's satisfaction  Ihor Austin, [facility]  [facility] [address] [place], [place] [address]  Phone [phone] Fax [phone] Note: This document was prepared with digital dictation and possible smart phrase technology. Any transcriptional errors that result from this process are unintentional.

## 2023-08-23 ENCOUNTER — Encounter: Payer: Self-pay | Admitting: Adult Health

## 2023-08-23 ENCOUNTER — Ambulatory Visit: Payer: 59 | Admitting: Adult Health

## 2023-08-23 VITALS — BP 164/94 | HR 65 | Ht 72.0 in | Wt 230.0 lb

## 2023-08-23 DIAGNOSIS — G459 Transient cerebral ischemic attack, unspecified: Secondary | ICD-10-CM | POA: Diagnosis not present

## 2023-08-23 NOTE — Progress Notes (Signed)
I agree with the above plan 

## 2023-08-23 NOTE — Patient Instructions (Addendum)
Continue aspirin 81 mg daily  and atorvastatin and Zetia for secondary stroke prevention - refills be provided by your primary care provider (PCP)  Your loop recorder will continue to be monitored by cardiology - you will be contacted with any concerning findings  Would recommend purchasing a blood pressure cuff to routinely monitor blood pressure at home - if you start to have any symptoms such as headache, vision changes, nausea/vomiting, chest pain or dizziness, please call 911 immediately for urgent evaluation   Continue to follow up with PCP regarding blood pressure, cholesterol and diabetes management  Maintain strict control of hypertension with blood pressure goal below 130/90, diabetes with hemoglobin A1c goal below 7.0 % and cholesterol with LDL cholesterol (bad cholesterol) goal below 70 mg/dL.   Signs of a Stroke? Follow the BEFAST method:  Balance Watch for a sudden loss of balance, trouble with coordination or vertigo Eyes Is there a sudden loss of vision in one or both eyes? Or double vision?  Face: Ask the person to smile. Does one side of the face droop or is it numb?  Arms: Ask the person to raise both arms. Does one arm drift downward? Is there weakness or numbness of a leg? Speech: Ask the person to repeat a simple phrase. Does the speech sound slurred/strange? Is the person confused ? Time: If you observe any of these signs, call 911.        Thank you for coming to see [place] at [facility]. I hope we have been able to provide you high quality care today.  You may receive a patient satisfaction survey over the next few weeks. We would appreciate your feedback and comments so that we may continue to improve ourselves and the health of our patients.    Transient Ischemic Attack A transient ischemic attack (TIA) causes the same symptoms as a stroke, but the symptoms go away quickly. A TIA happens when blood flow to the brain is blocked. Having a TIA  means you may be at risk for a stroke. A TIA is a medical emergency. What are the causes? A TIA is caused by a blocked artery in the head or neck. This means the brain does not get the blood supply it needs. A blockage can be caused by: Fatty buildup in an artery in the head or neck. A blood clot. A tear in an artery. Irritation and swelling (inflammation) of an artery. Sometimes the cause is not known. What increases the risk? Certain things may make you more likely to have a TIA. Some of these are things that you can change, such as: Using products that have nicotine or tobacco. Not being active. Drinking too much alcohol. Using recreational drugs. Health conditions that may increase your risk include: High blood pressure. High cholesterol. Diabetes. Heart disease. A heartbeat that is not regular (atrial fibrillation). Sickle cell disease. Problems with blood clotting. Other risk factors include: Being over the age of 9. Being male. Being very overweight. Sleep problems (sleep apnea). Having a family history of stroke. Having had blood clots, stroke, TIA, or heart attack in the past. What are the signs or symptoms? The symptoms of a TIA are like those of a stroke. They can include: Weakness or loss of feeling in your face, arm, or leg. This often happens on one side of your body. Trouble walking. Trouble moving your arms or legs. Trouble talking or understanding what people are saying. Problems with how you see. Feeling dizzy. Feeling confused.  Loss of balance or coordination. Feeling like you may vomit (nausea) or vomiting. Having a very bad headache. If you can, note what time you started to have symptoms. Tell your doctor. How is this treated? The goal of treatment is to lower the risk for a stroke. This may include: Changes to diet and lifestyle, such as getting regular exercise and stopping smoking. Taking medicines to: Thin the blood. Lower blood  pressure. Lower cholesterol. Treating other health conditions, such as diabetes. If testing shows that an artery in your brain is narrow, your doctor may recommend a procedure to: Take the blockage out of your artery. Open or widen an artery in your neck (carotid angioplasty and stenting). Follow these instructions at home: Medicines Take over-the-counter and prescription medicines only as told by your doctor. If you were told to take aspirin or another medicine to thin your blood, use it exactly as told by your doctor. Taking too much of the medicine can cause bleeding. Taking too little of the medicine may not work to treat the problem. Eating and drinking  Eat 5 or more servings of fruits and vegetables each day. Follow instructions from your doctor about your diet. You may need to follow a certain diet to help lower your risk of a stroke. You may need to: Eat a diet that is low in fat and salt. Eat foods with a lot of fiber. Limit carbohydrates and sugar. If you drink alcohol: Limit how much you have to: 0-1 drink a day for women who are not pregnant. 0-2 drinks a day for men. Know how much alcohol is in a drink. In the [place] one drink equals one 12 oz bottle of beer (355 mL), one 5 oz glass of wine (148 mL), or one 1 oz glass of hard liquor (44 mL). General instructions Keep a healthy weight. Try to get at least 30 minutes of exercise on most days. Get treatment if you have sleep problems. Do not smoke or use any products that contain nicotine or tobacco. If you need help quitting, ask your doctor. Do not use drugs. Keep all follow-up visits. Your doctor will want to know if you have any more symptoms and to check blood labs if any medicines were prescribed. Where to find more information American Stroke Association: stroke.org Get help right away if: You have chest pain. You have a heartbeat that is not regular. You have any signs of a stroke. "BE FAST" is an easy way to  remember the main warning signs: B - Balance. Dizziness, sudden trouble walking, or loss of balance. E - Eyes. Trouble seeing or a change in how you see. F - Face. Sudden weakness or loss of feeling of the face. The face or eyelid may droop on one side. A - Arms. Weakness or loss of feeling in an arm. This happens all of a sudden and most often on one side of the body. S - Speech. Sudden trouble speaking, slurred speech, or trouble understanding what people say. T - Time. Time to call emergency services. Write down what time symptoms started. You have other signs of a stroke, such as: A sudden, very bad headache with no known cause. Feeling like you may vomit. Vomiting. A seizure. These symptoms may be an emergency. Get help right away. Call 911. Do not wait to see if the symptoms will go away. Do not drive yourself to the hospital. This information is not intended to replace advice given to you by your health  care provider. Make sure you discuss any questions you have with your health care provider. Document Revised: 05/20/2022 Document Reviewed: 05/20/2022 Elsevier Patient Education  2024 [employer].

## 2023-08-31 NOTE — Progress Notes (Signed)
Carelink Summary Report / Loop Recorder 

## 2023-09-13 ENCOUNTER — Encounter: Payer: Self-pay | Admitting: Family Medicine

## 2023-09-13 ENCOUNTER — Ambulatory Visit (INDEPENDENT_AMBULATORY_CARE_PROVIDER_SITE_OTHER): Payer: 59 | Admitting: Family Medicine

## 2023-09-13 ENCOUNTER — Ambulatory Visit: Payer: PRIVATE HEALTH INSURANCE | Admitting: Internal Medicine

## 2023-09-13 VITALS — BP 160/90 | HR 67 | Temp 98.1°F | Wt 228.6 lb

## 2023-09-13 DIAGNOSIS — E1165 Type 2 diabetes mellitus with hyperglycemia: Secondary | ICD-10-CM | POA: Diagnosis not present

## 2023-09-13 DIAGNOSIS — I10 Essential (primary) hypertension: Secondary | ICD-10-CM | POA: Diagnosis not present

## 2023-09-13 DIAGNOSIS — E785 Hyperlipidemia, unspecified: Secondary | ICD-10-CM | POA: Diagnosis not present

## 2023-09-13 DIAGNOSIS — G459 Transient cerebral ischemic attack, unspecified: Secondary | ICD-10-CM

## 2023-09-13 DIAGNOSIS — Z8673 Personal history of transient ischemic attack (TIA), and cerebral infarction without residual deficits: Secondary | ICD-10-CM | POA: Diagnosis not present

## 2023-09-13 DIAGNOSIS — Z794 Long term (current) use of insulin: Secondary | ICD-10-CM

## 2023-09-13 LAB — GLUCOSE, POCT (MANUAL RESULT ENTRY): POC Glucose: 164 mg/dl — AB (ref 70–99)

## 2023-09-13 MED ORDER — EZETIMIBE 10 MG PO TABS: 10.0000 mg | ORAL_TABLET | Freq: Every day | ORAL | 1 refills | Status: DC

## 2023-09-13 MED ORDER — INSULIN GLARGINE 100 UNIT/ML SOLOSTAR PEN
12.0000 [IU] | PEN_INJECTOR | Freq: Every day | SUBCUTANEOUS | 0 refills | Status: DC
Start: 2023-09-13 — End: 2023-10-20

## 2023-09-13 NOTE — Patient Instructions (Addendum)
12 units insulin per day for now. No change in glipizide or metformin for now. Recheck in 2 weeks with home readings to decide on changes. If any low readings (under 100) or over 300, let me know right away. Check blood sugars daily.   Blood pressure slightly high today. Bring all your meds to next visit and we can decide if changes needed. Make sure you are taking the 100mg  losartan dose, NOT the 25mg .  Keep a record of your blood pressures outside of the office and if over 140/90 - let me know.    I refilled the ezetimibe. Other medications should have refills at your pharmacy.  Let me know if they do not.  Return to the clinic or go to the nearest emergency room if any of your symptoms worsen or new symptoms occur.

## 2023-09-13 NOTE — Progress Notes (Signed)
Subjective:  Patient ID: Dylan Abbott, male    DOB: 01-Sep-1966  Age: 57 y.o. MRN: [id number]  CC:  Chief Complaint  Patient presents with   Medical Management of Chronic Issues    HPI Dylan Abbott presents for   Diabetes: Complicated by hyperglycemia, previous medication nonadherence, CBD with suspected TIA.  Uncontrolled A1c in July.  When we clarified his insulin pen and dosing, realize he had been injecting only 2 units per night, not 27 units as planned.  In office teaching performed regarding dosing of the Lantus insulin pen.  Started 10 units initially per night and plan for 1 week recheck at his August 28 visit.  Since last visit, started at 10 units, but then increased insulin to 25 units -  Has been giving self different doses based on his home readings.  CBG record - no readings recorded in past few days. ? 212 few days ago.  9/17 - 212, on 9/16 -105, on 9/15 -147, on 9/14 -186 fasting readings.  Possible late meal on day of 105, no symptoms and no lower readings. Stopped insulin when he saw lower readings (105). Skipped one day of insulin, then started back on 10 units per day since 9/17. Last dose insulin this morning - 10 units.  Still taking glipizide 10mg  BID. Metformin 850mg  bid with meals. No new side effects. On ARB and statin.  Neuro appointment September 4 -continued on aspirin 81 mg daily, atorvastatin and Zetia for secondary stroke prevention  (zetia started in July, has not had repeat testing) No sign of atrial fibrillation on loop recorder.  Goal A1c less than 7, LDL less than 70, BP under 130/90. Remains on ASA, lipitor, zetia. Without side effects.  No CP, HA, focal weakness.   Fasting now. 11 hours since last meal.   No home BP readings, no missed doses of meds., on losartan - unsure dose - using pillbox. Prior on 25mg  and 100mg  - unsure which one he is taking.  BP Readings from Last 3 Encounters:  09/13/23 (!) 138/90  08/23/23 (!) 164/94  08/16/23  118/66   In office glucose 164 today.   Lab Results  Component Value Date   HGBA1C 12.2 (H) 07/18/2023   HGBA1C 12.1 (H) 07/18/2023   HGBA1C 8.1 (H) 12/16/2022   Lab Results  Component Value Date   MICROALBUR 1.4 11/16/2022   LDLCALC 87 07/18/2023   CREATININE 1.22 07/27/2023   Esophageal dysphagia Discussed at August 28 visit.  Started on omeprazole, referred to gastroenterology. Appt in November. Omeprazole has helped - no further problems swallowing.   History Patient Active Problem List   Diagnosis Date Noted   NSTEMI (non-ST elevated myocardial infarction) (HCC) 07/16/2023   Impingement syndrome of left shoulder 07/30/2020   COVID-19 virus infection 10/31/2019   Abnormal liver function 10/31/2019   Hypokalemia 10/31/2019   Hyponatremia 10/31/2019   Diabetes mellitus (HCC) 11/13/2017   Hyperlipidemia 11/13/2017   Hypertensive disorder 11/13/2017   Past Medical History:  Diagnosis Date   Diabetes mellitus without complication (HCC)    Hyperlipidemia    Hypertension    Past Surgical History:  Procedure Laterality Date   LOOP RECORDER INSERTION N/A 07/18/2023   Procedure: LOOP RECORDER INSERTION;  Surgeon: Graciella Freer, PA-C;  Location: [facility];  Service: Cardiovascular;  Laterality: N/A;   No Known Allergies Prior to Admission medications   Medication Sig Start Date End Date Taking? Authorizing Provider  Accu-Chek FastClix Lancets MISC USE TO CHECK BLOOD  SUGAR EVERY DAY 09/08/21  Yes Shade Flood, MD  aspirin EC 81 MG tablet Take 1 tablet (81 mg total) by mouth daily for 30 days, then as directed by provider. Swallow whole. 07/19/23  Yes Burnadette Pop, MD  atorvastatin (LIPITOR) 40 MG tablet Take 1 tablet (40 mg total) by mouth at bedtime. 08/03/23  Yes Shade Flood, MD  Blood Glucose Monitoring Suppl DEVI 1 each by Does not apply route in the morning, at noon, and at bedtime. May substitute to any manufacturer covered by patient's  insurance. 07/27/23  Yes Shade Flood, MD  cetirizine (ZYRTEC) 10 MG tablet Take 1 tablet (10 mg total) by mouth daily. 08/09/23  Yes Shade Flood, MD  diphenhydrAMINE (BENADRYL) 25 MG tablet Take 1 tablet (25 mg total) by mouth every 6 (six) hours as needed for itching. Take 1 to 2 tablets every 6 hours as needed for itching. 08/07/23  Yes Radford Pax, NP  ezetimibe (ZETIA) 10 MG tablet Take 1 tablet (10 mg total) by mouth daily. 07/19/23  Yes Burnadette Pop, MD  glipiZIDE (GLUCOTROL) 10 MG tablet Take 1 tablet (10 mg total) by mouth 2 (two) times daily before a meal. 08/03/23  Yes Shade Flood, MD  insulin glargine (LANTUS) 100 UNIT/ML Solostar Pen Inject 27 Units into the skin daily. 08/03/23  Yes Shade Flood, MD  Insulin Pen Needle (PEN NEEDLES) 32G X 4 MM MISC 1 application  by Does not apply route daily. 08/03/23  Yes Shade Flood, MD  losartan (COZAAR) 100 MG tablet Take 1 tablet (100 mg total) by mouth daily. Patient taking differently: Take 100 mg by mouth daily. Taking 125 mg at this time after urgent care visit 08/03/23  Yes Shade Flood, MD  losartan (COZAAR) 25 MG tablet Take 25 mg by mouth daily.   Yes [provider]  metFORMIN (GLUCOPHAGE) 850 MG tablet Take 1 tablet (850 mg total) by mouth 2 (two) times daily with a meal. 08/03/23  Yes Shade Flood, MD  NIFEdipine (ADALAT CC) 90 MG 24 hr tablet Take 1 tablet (90 mg total) by mouth daily. 08/03/23  Yes Shade Flood, MD  omeprazole (PRILOSEC) 20 MG capsule Take 1 capsule (20 mg total) by mouth daily. 08/16/23  Yes Shade Flood, MD  potassium chloride (KLOR-CON) 10 MEQ tablet Take 2 tablets (20 mEq total) by mouth daily. 08/03/23  Yes Shade Flood, MD  simvastatin (ZOCOR) 80 MG tablet Take 80 mg by mouth daily.   Yes [provider]   Social History   Socioeconomic History   Marital status: Married    Spouse name: Not on file   Number of children: 2   Years of education:  Not on file   Highest education level: Not on file  Occupational History   Occupation: environmental  Tobacco Use   Smoking status: Never   Smokeless tobacco: Never  Substance and Sexual Activity   Alcohol use: Never    Alcohol/week: 0.0 standard drinks of alcohol   Drug use: Never   Sexual activity: Yes  Other Topics Concern   Not on file  Social History Narrative   Not on file   Social Determinants of Health   Financial Resource Strain: Not on file  Food Insecurity: No Food Insecurity (07/17/2023)   Hunger Vital Sign    Worried About Running Out of Food in the Last Year: Never true    Ran Out of Food in the Last  Year: Never true  Transportation Needs: No Transportation Needs (07/17/2023)   PRAPARE - Administrator, Civil Service (Medical): No    Lack of Transportation (Non-Medical): No  Physical Activity: Not on file  Stress: Not on file  Social Connections: Not on file  Intimate Partner Violence: Not At Risk (07/17/2023)   Humiliation, Afraid, Rape, and Kick questionnaire    Fear of Current or Ex-Partner: No    Emotionally Abused: No    Physically Abused: No    Sexually Abused: No    Review of Systems   Objective:   Vitals:   09/13/23 1505  BP: (!) 140/90  Pulse: 67  Temp: 98.1 F (36.7 C)  TempSrc: Oral  SpO2: 96%  Weight: 228 lb 9.6 oz (103.7 kg)     Physical Exam Vitals reviewed.  Constitutional:      Appearance: He is well-developed.  HENT:     Head: Normocephalic and atraumatic.  Neck:     Vascular: No carotid bruit or JVD.  Cardiovascular:     Rate and Rhythm: Normal rate and regular rhythm.     Heart sounds: Normal heart sounds. No murmur heard. Pulmonary:     Effort: Pulmonary effort is normal.     Breath sounds: Normal breath sounds. No rales.  Musculoskeletal:     Right lower leg: No edema.     Left lower leg: No edema.  Skin:    General: Skin is warm and dry.  Neurological:     Mental Status: He is alert and oriented to  person, place, and time.  Psychiatric:        Mood and Affect: Mood normal.        Assessment & Plan:  Dylan Abbott is a 57 y.o. male . Type 2 diabetes mellitus with hyperglycemia, without long-term current use of insulin (HCC) - Plan: Comprehensive metabolic panel, Lipid panel, POCT glucose (manual entry), insulin glargine (LANTUS) 100 UNIT/ML Solostar Pen, CANCELED: POCT Glucose (Device for Home Use)  -Time spent again in office today reviewing previous treatment plan, assessing understanding of previous treatment plan.  After further discussion it appears that he was initially taking 10 units of insulin then with higher home readings increased to 25 units but then off meds temporarily when lower 100 reading but no true hypoglycemia.  Back on 10 units now.  Slightly elevated A1c in office but also fasting  Given use of sulfonylurea I am hesitant to go too quickly on basal insulin increases.  Will increase to 12 units for now, continue metformin and glipizide same doses for now.  Monitor home readings with RTC precautions if lows or in 300s and would adjust regimen further.  Understanding of this plan was expressed including verification on teach back approach.  Primary hypertension with history of TIA (transient ischemic attack)  -Elevated in office and recently but looked much better at his last visit.  I am suspicious that he may have accidentally stopped the 100 mg losartan instead of the 25 mg.  Should be taking 100 mg losartan and advised him to check his medications at home to verify.  RTC precautions if persistent elevated readings and advised to bring medications to every visit to verify.   Hyperlipidemia, unspecified hyperlipidemia type - Plan: Comprehensive metabolic panel, Lipid panel  -Tolerating Lipitor and Zetia, check levels with prior addition of Zetia, goal LDL less than 70.  No orders of the defined types were placed in this encounter.  Patient Instructions  12 units  insulin per day for now. No change in glipizide or metformin for now. Recheck in 2 weeks with home readings to decide on changes. If any low readings (under 100) or over 300, let me know right away. Check blood sugars daily.   Blood pressure slightly high today. Bring all your meds to next visit and we can decide if changes needed. Make sure you are taking the 100mg  losartan dose, NOT the 25mg .  Keep a record of your blood pressures outside of the office and if over 140/90 - let me know.    I refilled the ezetimibe. Other medications should have refills at your pharmacy.  Let me know if they do not.  Return to the clinic or go to the nearest emergency room if any of your symptoms worsen or new symptoms occur.        Signed,   Meredith Staggers, MD [facility], [facility] 09/13/23 3:12 PM

## 2023-09-14 LAB — COMPREHENSIVE METABOLIC PANEL
ALT: 13 U/L (ref 0–53)
AST: 23 U/L (ref 0–37)
Albumin: 4.1 g/dL (ref 3.5–5.2)
Alkaline Phosphatase: 63 U/L (ref 39–117)
BUN: 18 mg/dL (ref 6–23)
CO2: 30 mEq/L (ref 19–32)
Calcium: 9.5 mg/dL (ref 8.4–10.5)
Chloride: 100 mEq/L (ref 96–112)
Creatinine, Ser: 1.03 mg/dL (ref 0.40–1.50)
GFR: 80.35 mL/min (ref >60.00)
Glucose, Bld: 162 mg/dL — ABNORMAL HIGH (ref 70–99)
Potassium: 3.4 mEq/L — ABNORMAL LOW (ref 3.5–5.1)
Sodium: 138 mEq/L (ref 135–145)
Total Bilirubin: 0.8 mg/dL (ref 0.2–1.2)
Total Protein: 7.3 g/dL (ref 6.0–8.3)

## 2023-09-14 LAB — LIPID PANEL
Cholesterol: 137 mg/dL (ref 0–200)
HDL: 57.6 mg/dL (ref >39.00)
LDL Cholesterol: 65 mg/dL (ref 0–99)
NonHDL: 79.42
Total CHOL/HDL Ratio: 2
Triglycerides: 74 mg/dL (ref 0.0–149.0)
VLDL: 14.8 mg/dL (ref 0.0–40.0)

## 2023-09-24 LAB — CUP PACEART REMOTE DEVICE CHECK
Date Time Interrogation Session: 20241005235410
Implantable Pulse Generator Implant Date: 20240730

## 2023-09-25 ENCOUNTER — Ambulatory Visit (INDEPENDENT_AMBULATORY_CARE_PROVIDER_SITE_OTHER): Payer: 59

## 2023-09-25 DIAGNOSIS — I639 Cerebral infarction, unspecified: Secondary | ICD-10-CM | POA: Diagnosis not present

## 2023-09-27 ENCOUNTER — Ambulatory Visit: Payer: 59 | Admitting: Family Medicine

## 2023-09-27 ENCOUNTER — Encounter: Payer: Self-pay | Admitting: Family Medicine

## 2023-09-27 VITALS — BP 136/84 | HR 66 | Temp 97.8°F | Wt 227.4 lb

## 2023-09-27 DIAGNOSIS — Z794 Long term (current) use of insulin: Secondary | ICD-10-CM | POA: Diagnosis not present

## 2023-09-27 DIAGNOSIS — E1165 Type 2 diabetes mellitus with hyperglycemia: Secondary | ICD-10-CM | POA: Diagnosis not present

## 2023-09-27 DIAGNOSIS — I10 Essential (primary) hypertension: Secondary | ICD-10-CM

## 2023-09-27 DIAGNOSIS — Z9889 Other specified postprocedural states: Secondary | ICD-10-CM | POA: Diagnosis not present

## 2023-09-27 DIAGNOSIS — L299 Pruritus, unspecified: Secondary | ICD-10-CM

## 2023-09-27 MED ORDER — TRIAMCINOLONE ACETONIDE 0.1 % EX CREA: 1.0000 | TOPICAL_CREAM | Freq: Two times a day (BID) | CUTANEOUS | 0 refills | Status: DC

## 2023-09-27 NOTE — Patient Instructions (Addendum)
Increase insulin to 14 units per day, no other changes at this time. Recheck in 2 weeks.  Blood pressure looks better today, even off meds for a few days. Stay on 100mg  losartan once per day.  Take care!  For the itching on the chest you can use the steroid cream once or twice per day for the next few days but if that is not improving please call the cardiologist where the loop recorder was placed.  Let them know you are having itching at the insertion site to determine what they recommend Dylan Abbott, [place] [phone]  Your losartan and atorvastatin are being filled by the [facility] pharmacy now, and should be available for pickup soon.

## 2023-09-27 NOTE — Progress Notes (Signed)
Subjective:  Patient ID: Dylan Abbott, male    DOB: 10-15-1966  Age: 57 y.o. MRN: [id number]  CC:  Chief Complaint  Patient presents with   Diabetes    F/U.    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  Prior misunderstanding about how much insulin to inject, and had been using 2 units per night, not 27 units previously planned.  At his last visit he reported starting at 10 units of insulin but then self increased to 25 units, different doses based on his home readings.  Ranging from 10 5-2 12 at last visit.  He did have a late meal on the day of the 105, no symptomatic lows otherwise.  He did stop insulin temporarily when he saw the low reading, we discussed continued use of medication and regular meals.  He was continued on glipizide and metformin same doses. Consistent dosing of 12 units insulin recommended, with home readings to decide on further changes. Home readings fasting - none - only checks random readings:  199, 225, 230, 243 No lows. No new med side effects.    Additionally there was some confusion regarding which dose of losartan he was taking last visit for hypertension as blood pressure in August was controlled, elevated on his last 2 visits.  Potentially may have stopped the 100 mg losartan instead of stopping the 25 mg as instructed.  Asked to bring home meds to verify. Pill bottles noted - losartan 100mg  empty - out for 3 days - needs refill.  Atorvastatin 40mg  every day - needs refill. Out for 3 days.  (Chart reviewed - meds were ordered in August, he states that only short term Rx).   BP Readings from Last 3 Encounters:  09/27/23 136/84  09/13/23 (!) 160/90  08/23/23 (!) 164/94   Lab Results  Component Value Date   HGBA1C 12.2 (H) 07/18/2023   HGBA1C 12.1 (H) 07/18/2023   HGBA1C 8.1 (H) 12/16/2022   Lab Results  Component Value Date    MICROALBUR 1.4 11/16/2022   LDLCALC 65 09/13/2023   CREATININE 1.03 09/13/2023    Itching bump on chest: Noted on left chest - reports this has been present since having loop recorder inserted.  On left chest, itchy since heart mont History Patient Active Problem List   Diagnosis Date Noted   NSTEMI (non-ST elevated myocardial infarction) (HCC) 07/16/2023   Impingement syndrome of left shoulder 07/30/2020   COVID-19 virus infection 10/31/2019   Abnormal liver function 10/31/2019   Hypokalemia 10/31/2019   Hyponatremia 10/31/2019   Diabetes mellitus (HCC) 11/13/2017   Hyperlipidemia 11/13/2017   Hypertensive disorder 11/13/2017   Past Medical History:  Diagnosis Date   Diabetes mellitus without complication (HCC)    Hyperlipidemia    Hypertension    Past Surgical History:  Procedure Laterality Date   LOOP RECORDER INSERTION N/A 07/18/2023   Procedure: LOOP RECORDER INSERTION;  Surgeon: Graciella Freer, PA-C;  [facility];  Service: Cardiovascular;  Laterality: N/A;   No Known Allergies Prior to Admission medications   Medication Sig Start Date End Date Taking? Authorizing Provider  Accu-Chek FastClix Lancets MISC USE TO CHECK BLOOD SUGAR EVERY DAY 09/08/21  Yes Shade Flood, MD  aspirin EC 81 MG tablet Take 1 tablet (81 mg total) by mouth daily for 30 days, then as directed by provider. Swallow whole. 07/19/23  Yes Burnadette Pop, MD  atorvastatin (LIPITOR) 40  MG tablet Take 1 tablet (40 mg total) by mouth at bedtime. 08/03/23  Yes Shade Flood, MD  Blood Glucose Monitoring Suppl DEVI 1 each by Does not apply route in the morning, at noon, and at bedtime. May substitute to any manufacturer covered by patient's insurance. 07/27/23  Yes Shade Flood, MD  cetirizine (ZYRTEC) 10 MG tablet Take 1 tablet (10 mg total) by mouth daily. 08/09/23  Yes Shade Flood, MD  diphenhydrAMINE (BENADRYL) 25 MG tablet Take 1 tablet (25 mg total) by mouth  every 6 (six) hours as needed for itching. Take 1 to 2 tablets every 6 hours as needed for itching. 08/07/23  Yes Radford Pax, NP  ezetimibe (ZETIA) 10 MG tablet Take 1 tablet (10 mg total) by mouth daily. 09/13/23  Yes Shade Flood, MD  glipiZIDE (GLUCOTROL) 10 MG tablet Take 1 tablet (10 mg total) by mouth 2 (two) times daily before a meal. 08/03/23  Yes Shade Flood, MD  insulin glargine (LANTUS) 100 UNIT/ML Solostar Pen Inject 12 Units into the skin daily. 09/13/23  Yes Shade Flood, MD  Insulin Pen Needle (PEN NEEDLES) 32G X 4 MM MISC 1 application  by Does not apply route daily. 08/03/23  Yes Shade Flood, MD  losartan (COZAAR) 100 MG tablet Take 1 tablet (100 mg total) by mouth daily. Patient taking differently: Take 100 mg by mouth daily. Taking 125 mg at this time after urgent care visit 08/03/23  Yes Shade Flood, MD  metFORMIN (GLUCOPHAGE) 850 MG tablet Take 1 tablet (850 mg total) by mouth 2 (two) times daily with a meal. 08/03/23  Yes Shade Flood, MD  NIFEdipine (ADALAT CC) 90 MG 24 hr tablet Take 1 tablet (90 mg total) by mouth daily. 08/03/23  Yes Shade Flood, MD  omeprazole (PRILOSEC) 20 MG capsule Take 1 capsule (20 mg total) by mouth daily. 08/16/23  Yes Shade Flood, MD  potassium chloride (KLOR-CON) 10 MEQ tablet Take 2 tablets (20 mEq total) by mouth daily. 08/03/23  Yes Shade Flood, MD  simvastatin (ZOCOR) 80 MG tablet Take 80 mg by mouth daily.   Yes [provider]   Social History   Socioeconomic History   Marital status: Married    Spouse name: Not on file   Number of children: 2   Years of education: Not on file   Highest education level: Not on file  Occupational History   Occupation: environmental  Tobacco Use   Smoking status: Never   Smokeless tobacco: Never  Substance and Sexual Activity   Alcohol use: Never    Alcohol/week: 0.0 standard drinks of alcohol   Drug use: Never   Sexual activity: Yes  Other  Topics Concern   Not on file  Social History Narrative   Not on file   Social Determinants of Health   Financial Resource Strain: Not on file  Food Insecurity: No Food Insecurity (07/17/2023)   Hunger Vital Sign    Worried About Running Out of Food in the Last Year: Never true    Ran Out of Food in the Last Year: Never true  Transportation Needs: No Transportation Needs (07/17/2023)   PRAPARE - Administrator, Civil Service (Medical): No    Lack of Transportation (Non-Medical): No  Physical Activity: Not on file  Stress: Not on file  Social Connections: Not on file  Intimate Partner Violence: Not At Risk (07/17/2023)   Humiliation, Afraid, Rape, and  Kick questionnaire    Fear of Current or Ex-Partner: No    Emotionally Abused: No    Physically Abused: No    Sexually Abused: No    Review of Systems  Per HPI  Objective:   Vitals:   09/27/23 1502  BP: 136/84  Pulse: 66  Temp: 97.8 F (36.6 C)  SpO2: 98%  Weight: 227 lb 6.4 oz (103.1 kg)     Physical Exam Vitals reviewed.  Constitutional:      Appearance: He is well-developed.  HENT:     Head: Normocephalic and atraumatic.  Neck:     Vascular: No carotid bruit or JVD.  Cardiovascular:     Rate and Rhythm: Normal rate and regular rhythm.     Heart sounds: Normal heart sounds. No murmur heard.    Comments: Firm area left chest wall at site of loop recorder, describes area of itching at the wound site medial aspect, see photo.  No significant surrounding erythema or discharge appreciated. Pulmonary:     Effort: Pulmonary effort is normal.     Breath sounds: Normal breath sounds. No rales.  Musculoskeletal:     Right lower leg: No edema.     Left lower leg: No edema.  Skin:    General: Skin is warm and dry.  Neurological:     Mental Status: He is alert and oriented to person, place, and time.  Psychiatric:        Mood and Affect: Mood normal.         Assessment & Plan:  Dylan Abbott is a  57 y.o. male . Type 2 diabetes mellitus with hyperglycemia, without long-term current use of insulin (HCC)  -Still overloaded readings, will increase insulin by 2 additional units for 14 units.  Compliant with glipizide and metformin, verified understanding of plan with teach back approach.  All questions were answered.  2-week follow-up.  Hypertension  -Verify medications in office including losartan, and refills verified at pharmacy.  Based on current reading and off meds few days, no additional medication at this time.  Can recheck at follow-up in 2 weeks.  Pruritus History of loop recorder  -Pruritus at scar of prior loop recorder placement.  Temporary topical triamcinolone to help with itching, but advised to call cardiology if persistent discomfort or irritation of that area.  No appreciable sign of infection at this time.  RTC precautions. Meds ordered this encounter  Medications   triamcinolone cream (KENALOG) 0.1 %    Sig: Apply 1 Application topically 2 (two) times daily.    Dispense:  30 g    Refill:  0   Patient Instructions  Increase insulin to 14 units per day, no other changes at this time. Recheck in 2 weeks.  Blood pressure looks better today, even off meds for a few days. Stay on 100mg  losartan once per day.  Take care!  For the itching on the chest you can use the steroid cream once or twice per day for the next few days but if that is not improving please call the cardiologist where the loop recorder was placed.  Let them know you are having itching at the insertion site to determine what they recommend Dylan Abbott, [place] [phone]    Signed,   Meredith Staggers, MD [facility], [facility] 09/27/23 3:57 PM

## 2023-10-09 NOTE — Progress Notes (Signed)
Carelink Summary Report / Loop Recorder 

## 2023-10-11 ENCOUNTER — Ambulatory Visit (INDEPENDENT_AMBULATORY_CARE_PROVIDER_SITE_OTHER): Payer: 59 | Admitting: Family Medicine

## 2023-10-11 VITALS — BP 130/78 | HR 72 | Temp 98.2°F | Ht 72.0 in | Wt 228.6 lb

## 2023-10-11 DIAGNOSIS — I10 Essential (primary) hypertension: Secondary | ICD-10-CM | POA: Diagnosis not present

## 2023-10-11 DIAGNOSIS — E1165 Type 2 diabetes mellitus with hyperglycemia: Secondary | ICD-10-CM | POA: Diagnosis not present

## 2023-10-11 DIAGNOSIS — R519 Headache, unspecified: Secondary | ICD-10-CM

## 2023-10-11 DIAGNOSIS — Z794 Long term (current) use of insulin: Secondary | ICD-10-CM

## 2023-10-11 LAB — GLUCOSE, POCT (MANUAL RESULT ENTRY): POC Glucose: 308 mg/dL — AB (ref 70–99)

## 2023-10-11 NOTE — Patient Instructions (Addendum)
Exam is reassuring today.  Please follow-up if headache is not continuing to improve or if any new or worsening symptoms.  Increase Lantus insulin to 14 units/day. No other changes today. I am referring you to pharmacist to assist as well. Recheck in 2 weeks.   Return to the clinic or go to the nearest emergency room if any of your symptoms worsen or new symptoms occur.

## 2023-10-11 NOTE — Progress Notes (Signed)
Subjective:  Patient ID: Dylan Abbott, male    DOB: 05-24-1966  Age: 57 y.o. 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  Variable insulin dosing after misunderstanding of initial insulin dosing.  12 units initially daily dosing of Lantus, but still some elevated readings from 1 99-2 43 at his last visit on October 9.  Was continuing to take glipizide and metformin same doses at that time.Increased his Lantus to 14 units and teach back approach used for verification of understanding of plan. Since last visit just taking 12 units  - could not remember if he should increase dose.  Taking metformin BID.  Taking glipizide 10gm BID.  home readings: Fasting only: 188, 178, 156, 178, 187, 200, 265, 280, 290, 230, 200 Postprandial - none Symptomatic lows none.  No n/v/abd pain.  HA 3 days ago. Mild HA currently. Off and on. No focal weakness. No new vision symptoms. No speech difficulty.  Not worst HA of life. Frontal and mild now.  On statin and zetia.   Results for orders placed or performed in visit on 10/11/23  POCT glucose (manual entry)  Result Value Ref Range   POC Glucose 308 (A) 70 - 99 mg/dl     Lab Results  Component Value Date   HGBA1C 12.2 (H) 07/18/2023   HGBA1C 12.1 (H) 07/18/2023   HGBA1C 8.1 (H) 12/16/2022   Lab Results  Component Value Date   MICROALBUR 1.4 11/16/2022   LDLCALC 65 09/13/2023   CREATININE 1.03 09/13/2023   Hypertension: Medications were verified at his last visit October 9 and we verified refills at his pharmacy.  He had been out of his losartan 100 mg for a few days  at that visit but had been using the 25 mg.  Discussed continuing just 100 mg losartan daily.  Has been taking losartan 100mg  every day.   Home readings: none.  BP Readings from Last 3 Encounters:  10/11/23 130/78  09/27/23 136/84  09/13/23 (!) 160/90   Lab Results  Component Value Date   CREATININE 1.03 09/13/2023   History of borderline hypokalemia of 3.4 in September, on potassium supplement.  Borderline hyponatremia of 132 in August but hyperglycemic with glucose 249 at that time.  History Patient Active Problem List   Diagnosis Date Noted   NSTEMI (non-ST elevated myocardial infarction) (HCC) 07/16/2023   Impingement syndrome of left shoulder 07/30/2020   COVID-19 virus infection 10/31/2019   Abnormal liver function 10/31/2019   Hypokalemia 10/31/2019   Hyponatremia 10/31/2019   Diabetes mellitus (HCC) 11/13/2017   Hyperlipidemia 11/13/2017   Hypertensive disorder 11/13/2017   Past Medical History:  Diagnosis Date   Diabetes mellitus without complication (HCC)    Hyperlipidemia    Hypertension    Past Surgical History:  Procedure Laterality Date   LOOP RECORDER INSERTION N/A 07/18/2023   Procedure: LOOP RECORDER INSERTION;  Surgeon: Graciella Freer, PA-C;  Location: [facility];  Service: Cardiovascular;  Laterality: N/A;   No Known Allergies Prior to Admission medications   Medication Sig Start Date End Date Taking? Authorizing  Provider  Accu-Chek FastClix Lancets MISC USE TO CHECK BLOOD SUGAR EVERY DAY 09/08/21  Yes Shade Flood, MD  aspirin EC 81 MG tablet Take 1 tablet (81 mg total) by mouth daily for 30 days, then as directed by provider. Swallow whole. 07/19/23  Yes Burnadette Pop, MD  atorvastatin (LIPITOR) 40 MG tablet Take 1 tablet (40 mg total) by mouth at bedtime. 08/03/23  Yes Shade Flood, MD  Blood Glucose Monitoring Suppl DEVI 1 each by Does not apply route in the morning, at noon, and at bedtime. May substitute to any  manufacturer covered by patient's insurance. 07/27/23  Yes Shade Flood, MD  cetirizine (ZYRTEC) 10 MG tablet Take 1 tablet (10 mg total) by mouth daily. 08/09/23  Yes Shade Flood, MD  diphenhydrAMINE (BENADRYL) 25 MG tablet Take 1 tablet (25 mg total) by mouth every 6 (six) hours as needed for itching. Take 1 to 2 tablets every 6 hours as needed for itching. 08/07/23  Yes Radford Pax, NP  ezetimibe (ZETIA) 10 MG tablet Take 1 tablet (10 mg total) by mouth daily. 09/13/23  Yes Shade Flood, MD  glipiZIDE (GLUCOTROL) 10 MG tablet Take 1 tablet (10 mg total) by mouth 2 (two) times daily before a meal. 08/03/23  Yes Shade Flood, MD  insulin glargine (LANTUS) 100 UNIT/ML Solostar Pen Inject 12 Units into the skin daily. 09/13/23  Yes Shade Flood, MD  Insulin Pen Needle (PEN NEEDLES) 32G X 4 MM MISC 1 application  by Does not apply route daily. 08/03/23  Yes Shade Flood, MD  losartan (COZAAR) 100 MG tablet Take 1 tablet (100 mg total) by mouth daily. Patient taking differently: Take 100 mg by mouth daily. Taking 125 mg at this time after urgent care visit 08/03/23  Yes Shade Flood, MD  metFORMIN (GLUCOPHAGE) 850 MG tablet Take 1 tablet (850 mg total) by mouth 2 (two) times daily with a meal. 08/03/23  Yes Shade Flood, MD  NIFEdipine (ADALAT CC) 90 MG 24 hr tablet Take 1 tablet (90 mg total) by mouth daily. 08/03/23  Yes Shade Flood, MD  omeprazole (PRILOSEC) 20 MG capsule Take 1 capsule (20 mg total) by mouth daily. 08/16/23  Yes Shade Flood, MD  potassium chloride (KLOR-CON) 10 MEQ tablet Take 2 tablets (20 mEq total) by mouth daily. 08/03/23  Yes Shade Flood, MD  simvastatin (ZOCOR) 80 MG tablet Take 80 mg by mouth daily.   Yes [provider]  triamcinolone cream (KENALOG) 0.1 % Apply 1 Application topically 2 (two) times daily. 09/27/23  Yes Shade Flood, MD   Social History   Socioeconomic History   Marital status: Married     Spouse name: Not on file   Number of children: 2   Years of education: Not on file   Highest education level: Not on file  Occupational History   Occupation: environmental  Tobacco Use   Smoking status: Never   Smokeless tobacco: Never  Substance and Sexual Activity   Alcohol use: Never    Alcohol/week: 0.0 standard drinks of alcohol   Drug use: Never   Sexual activity: Yes  Other Topics Concern   Not on file  Social History Narrative   Not on file   Social Determinants of Health   Financial Resource Strain: Not on file  Food Insecurity: No Food Insecurity (07/17/2023)   Hunger Vital Sign    Worried About Running Out of Food in the  Last Year: Never true    Ran Out of Food in the Last Year: Never true  Transportation Needs: No Transportation Needs (07/17/2023)   PRAPARE - Administrator, Civil Service (Medical): No    Lack of Transportation (Non-Medical): No  Physical Activity: Not on file  Stress: Not on file  Social Connections: Not on file  Intimate Partner Violence: Not At Risk (07/17/2023)   Humiliation, Afraid, Rape, and Kick questionnaire    Fear of Current or Ex-Partner: No    Emotionally Abused: No    Physically Abused: No    Sexually Abused: No    Review of Systems  Per HPI.  Objective:   Vitals:   10/11/23 1551  BP: 130/78  Pulse: 72  Temp: 98.2 F (36.8 C)  TempSrc: Temporal  SpO2: 97%  Weight: 228 lb 9.6 oz (103.7 kg)  Height: 6' (1.829 m)     Physical Exam Vitals reviewed.  Constitutional:      Appearance: He is well-developed.  HENT:     Head: Normocephalic and atraumatic.     Comments: Sinuses nontender Neck:     Vascular: No carotid bruit or JVD.  Cardiovascular:     Rate and Rhythm: Normal rate and regular rhythm.     Heart sounds: Normal heart sounds. No murmur heard. Pulmonary:     Effort: Pulmonary effort is normal.     Breath sounds: Normal breath sounds. No rales.  Musculoskeletal:     Right lower leg: No edema.      Left lower leg: No edema.  Skin:    General: Skin is warm and dry.  Neurological:     General: No focal deficit present.     Mental Status: He is alert and oriented to person, place, and time.     GCS: GCS eye subscore is 4. GCS verbal subscore is 5. GCS motor subscore is 6.     Cranial Nerves: No dysarthria or facial asymmetry.     Motor: No weakness, tremor or pronator drift.     Coordination: Romberg sign negative. Coordination normal. Heel to Shin Test normal.     Gait: Gait is intact.  Psychiatric:        Mood and Affect: Mood normal.     Results for orders placed or performed in visit on 10/11/23  POCT glucose (manual entry)  Result Value Ref Range   POC Glucose 308 (A) 70 - 99 mg/dl    Assessment & Plan:  Dylan Abbott is a 57 y.o. male . Type 2 diabetes mellitus with hyperglycemia, without long-term current use of insulin (HCC) - Plan: POCT glucose (manual entry), AMB Referral [facility] Care Management  Essential hypertension - Plan: AMB Referral [facility]BCI Care Management  Nonintractable episodic headache, unspecified headache type  Uncontrolled diabetes as above.  Some readings in the 100s since last visit, but 300 in office currently.  Unfortunately misunderstanding on change in dose last visit.  I will refer him to pharmacy to help with medication adherence and discussion with meds, disease management.  Increase Lantus to 14 units per now, with teach back approach, verification of understanding of plan again today.  All questions were answered.  Nonfocal neuroexam.  Headache is improved and intermittent in nature, could be related to his hypertension or uncontrolled diabetes.  Continue monitoring with ER precautions given.  RTC precautions.  2-week follow-up for diabetes as above.  Blood pressure stable in office at this time.  No other med changes at this time.  No orders of the defined types were placed in this encounter.  Patient Instructions  Exam is reassuring today.   Please follow-up if headache is not continuing to improve or if any new or worsening symptoms.  Increase Lantus insulin to 14 units/day. No other changes today. I am referring you to pharmacist to assist as well. Recheck in 2 weeks.   Return to the clinic or go to the nearest emergency room if any of your symptoms worsen or new symptoms occur.      Signed,   Meredith Staggers, MD [facility], [facility] 10/11/23 5:24 PM

## 2023-10-13 ENCOUNTER — Telehealth: Payer: Self-pay

## 2023-10-13 NOTE — Progress Notes (Unsigned)
Care Guide Note  10/13/2023 Name: Zyah Westhoff MRN: [id number] DOB: [birth year]  Referred by: Shade Flood, MD Reason for referral : Care Coordination (Outreach to schedule with Pharm d )   Zalmen Sadek is a 57 y.o. year old male who is a primary care patient of Neva Seat Asencion Partridge, MD. Denahi Ladzinski was referred to the pharmacist for assistance related to HTN.    An unsuccessful telephone outreach was attempted today to contact the patient who was referred to the pharmacy team for assistance with medication management. Additional attempts will be made to contact the patient.   Penne Lash, RMA Care Guide [facility]  [place], [place] [address] Direct Dial: [phone] [email or web address]

## 2023-10-19 NOTE — Progress Notes (Signed)
Care Guide Note  10/19/2023 Name: Dylan Abbott MRN: [id number] DOB: September 27, 1966  Referred by: Shade Flood, MD Reason for referral : Care Coordination (Outreach to schedule with Pharm d )   Dylan Abbott is a 57 y.o. year old male who is a primary care patient of Dylan Seat Asencion Partridge, MD. Dylan Abbott was referred to the pharmacist for assistance related to HTN.    Successful contact was made with the patient to discuss pharmacy services including being ready for the pharmacist to call at least 5 minutes before the scheduled appointment time, to have medication bottles and any blood sugar or blood pressure readings ready for review. The patient agreed to meet with the pharmacist via with the pharmacist via telephone visit on (date/time).  10/20/2023  Dylan Abbott, RMA Care Guide [facility]  [place], [place] [address] Direct Dial: [phone] [email or web address]

## 2023-10-20 ENCOUNTER — Other Ambulatory Visit: Payer: Self-pay | Admitting: Family Medicine

## 2023-10-20 ENCOUNTER — Other Ambulatory Visit: Payer: 59 | Admitting: Pharmacist

## 2023-10-20 ENCOUNTER — Encounter: Payer: Self-pay | Admitting: Pharmacist

## 2023-10-20 DIAGNOSIS — E1165 Type 2 diabetes mellitus with hyperglycemia: Secondary | ICD-10-CM

## 2023-10-20 MED ORDER — OMEPRAZOLE 20 MG PO CPDR: 20.0000 mg | DELAYED_RELEASE_CAPSULE | Freq: Every day | ORAL | 0 refills | Status: DC

## 2023-10-20 MED ORDER — INSULIN GLARGINE 100 UNIT/ML SOLOSTAR PEN
14.0000 [IU] | PEN_INJECTOR | Freq: Every day | SUBCUTANEOUS | 0 refills | Status: DC
Start: 2023-10-20 — End: 2023-11-03

## 2023-10-20 MED ORDER — FREESTYLE LIBRE 3 PLUS SENSOR MISC: 0 refills | Status: DC

## 2023-10-20 NOTE — Progress Notes (Unsigned)
10/20/2023 Name: Dylan Abbott MRN: [id number] DOB: 18-Mar-1966  Chief Complaint  Patient presents with   Medication Management    Dylan Abbott is a 57 y.o. year old male who presented for a telephone visit.   They were referred to the pharmacist by their PCP for assistance in managing diabetes, medication access, and complex medication management.    Subjective:  Medication Access/Adherence  Patient reports concerns about cost of medications. Reports some months his medications cost $300.  I called his pharmacy to get more information about cost and they reported his most recent copays were all $0.  Spoke to his insurance plan to get more information. They report patient has $2000 deductible to meet each year. He has met that deductible this year so all meds are $0. Deductible will restart at beginning of 2025.   Current Pharmacy:  [facility] #[id number] [place], [place] - [address] AT [facility] OF [facility] [address] [place] [address] Phone: [phone] Fax: [phone]  [facility] [address] [place] [address] Phone: [phone] Fax: [phone]   Patient reports affordability concerns with their medications: Yes   Patient reports access/transportation concerns to their pharmacy: No  Patient reports adherence concerns with their medications:  No   - but refill history does show some inconsistencies in regular refills   Diabetes:  Current medications:  Lantus 14 units daily; metformin 850mg  twice a day and glipizide 10mg  daily.   Medications tried in the past:   Current glucose readings range from 150's to 250's (patient was not at home to give me exact numbers)  testing 1 or 2 times daily   Patient denies hypoglycemic s/sx including no dizziness, shakiness, sweating. Reports blood glucose has never been < 100   Hyperlipidemia/ASCVD Risk Reduction  Current lipid lowering medications:  exetimibe 10mg  daily; atorvastatin 40mg  daily and simvastatin 80mg  daily are both on his med list - but patient reports he is taking simvastatin 80mg  daily (though has not been filled since May 2024)   Antiplatelet regimen: aspirin 81mg  daily   ASCVD History: MI and possible TIA  Risk Factors: male, age, type 2 DM, hypertension   Hypertension:   Current medications: losartan 125mg  daily (100mg  + 25mg )   Current home blood pressure readings: not checking  BP Readings from Last 3 Encounters:  10/11/23 130/78  09/27/23 136/84  09/13/23 (!) 160/90     Objective:  Lab Results  Component Value Date   HGBA1C 12.2 (H) 07/18/2023   HGBA1C 12.1 (H) 07/18/2023    Lab Results  Component Value Date   CREATININE 1.03 09/13/2023   BUN 18 09/13/2023   NA 138 09/13/2023   K 3.4 (L) 09/13/2023   CL 100 09/13/2023   CO2 30 09/13/2023    Lab Results  Component Value Date   CHOL 137 09/13/2023   HDL 57.60 09/13/2023   LDLCALC 65 09/13/2023   TRIG 74.0 09/13/2023   CHOLHDL 2 09/13/2023    Medications Reviewed Today     Reviewed by Henrene Pastor, RPH-CPP (Pharmacist) on 10/20/23 at 1533  Med List Status: <None>   Medication Order Taking? Sig Documenting Provider Last Dose Status Informant  Accu-Chek FastClix Lancets MISC [id number] Yes USE TO CHECK BLOOD SUGAR EVERY DAY Shade Flood, MD Taking Active Self  aspirin EC 81 MG tablet [id number]  Take 1 tablet (81 mg total) by mouth daily for 30 days, then as directed by provider.  Swallow whole. Burnadette Pop, MD  Active   atorvastatin (LIPITOR) 40 MG tablet [id number] No Take 1 tablet (40 mg total) by mouth at bedtime.  Patient not taking: Reported on 10/20/2023   Shade Flood, MD Not Taking Active   Blood Glucose Monitoring Suppl DEVI [id number] Yes 1 each by Does not apply route in the morning, at noon, and at bedtime. May substitute to any manufacturer covered by patient's insurance. Shade Flood, MD Taking Active    cetirizine (ZYRTEC) 10 MG tablet [id number]  Take 1 tablet (10 mg total) by mouth daily. Shade Flood, MD  Active   diphenhydrAMINE (BENADRYL) 25 MG tablet [id number]  Take 1 tablet (25 mg total) by mouth every 6 (six) hours as needed for itching. Take 1 to 2 tablets every 6 hours as needed for itching. Radford Pax, NP  Active   ezetimibe (ZETIA) 10 MG tablet [id number] Yes Take 1 tablet (10 mg total) by mouth daily. Shade Flood, MD Taking Active   glipiZIDE (GLUCOTROL) 10 MG tablet [id number] Yes Take 1 tablet (10 mg total) by mouth 2 (two) times daily before a meal. Shade Flood, MD Taking Active   insulin glargine (LANTUS) 100 UNIT/ML Solostar Pen [id number] Yes Inject 12 Units into the skin daily.  Patient taking differently: Inject 14 Units into the skin daily.   Shade Flood, MD Taking Active   Insulin Pen Needle (PEN NEEDLES) 32G X 4 MM MISC [id number] Yes 1 application  by Does not apply route daily. Shade Flood, MD Taking Active   losartan (COZAAR) 100 MG tablet [id number] Yes Take 1 tablet (100 mg total) by mouth daily.  Patient taking differently: Take 100 mg by mouth daily. Taking 125 mg at this time after urgent care visit   Shade Flood, MD Taking Active   metFORMIN (GLUCOPHAGE) 850 MG tablet [id number] Yes Take 1 tablet (850 mg total) by mouth 2 (two) times daily with a meal. Shade Flood, MD Taking Active   omeprazole (PRILOSEC) 20 MG capsule [id number] Yes Take 1 capsule (20 mg total) by mouth daily. Shade Flood, MD Taking Active   potassium chloride (KLOR-CON) 10 MEQ tablet [id number] Yes Take 2 tablets (20 mEq total) by mouth daily. Shade Flood, MD Taking Active   simvastatin (ZOCOR) 80 MG tablet [id number] Yes Take 80 mg by mouth daily. [provider] Taking Active   triamcinolone cream (KENALOG) 0.1 % [id number]  Apply 1 Application topically 2 (two) times daily. Shade Flood, MD  Active                Assessment/Plan:  Medication Management:  - Discussed insurance plan and explained why cost was high at beginning of each year. ($2000 deductible) - Discussed possibly using [facility] in 2025 and instead of using his insurance to consider using [facility] Discount Drug List  $5 List = $5 per 30 days or $15 per 90 days: simvastatin, metformin, omeprazole  $10 List = $10 per 30 days or $30 per 90days: glipizide, potassium, losartan (because on 100mg  and 25mg )  Ezetimibe is not on discount list.   Estimate that if patient gets meds thru [facility] pharmacy off insurance and uses $35 / month coupon card for Lantus, then total cost per year would only be about $1100 which is less than his $2000 deductible. Patient will consider this option for 2025.  In the meantime since his meds will be $0 the rest of  2024, encouraged him to fill all meds he is able to fill before 12/19/2023.  - Since he has insurance coverage he is not eligible for medication assistance programs at this time.   Diabetes: - Currently uncontrolled - Reviewed long term cardiovascular and renal outcomes of uncontrolled blood sugar - Recommend to continue current regimen until we can get more blood glucose readings.  - Recommend to check glucose 2 times per day. I think using Continuous Glucose Monitor would be beneficial for Mr. Gulino. Since he has met his 2024 deductible I think could at least thru then end of 2024 would be affordable. Even if he only uses for 2 months, I think this would give his medical team and the patient some good information regarding blood glucose trends and help with adjusting medication therapy.  Hypertension: - Currently controlled - Reviewed long term cardiovascular and renal outcomes of uncontrolled blood pressure - Recommend to continue losartan 125mg  daily however usual max dose is 100mg  per day. Will check with PCP about alternatives - could change to valsasrtan 320mg  daily - cost  might be a little higher (per 90 DS valsartan is $45 versus $30 for 90 DS for losartan 100mg  and 25mg )     Hyperlipidemia/ASCVD Risk Reduction: - Currently LDL is at goal of < 70; Tg at goal of < 150 - Instructed patient that simvastatin and atrovastatin are the same type of medication. He should take EITHER atorvastatin or simvastatin. He would like to take simvastatin 80mg  daily. Continue ezetimibe 10mg  daily    Follow Up Plan: Tuesday 10/24/2023 - patient to have all med bottle in front of him for our telephone visit so we can determine how much he has of each medication.   Henrene Pastor, PharmD Clinical Pharmacist [facility]  Population Health [phone]

## 2023-10-24 ENCOUNTER — Other Ambulatory Visit (HOSPITAL_COMMUNITY): Payer: Self-pay

## 2023-10-24 ENCOUNTER — Other Ambulatory Visit: Payer: 59 | Admitting: Pharmacist

## 2023-10-24 MED ORDER — DEXCOM G6 TRANSMITTER MISC
1 refills | Status: DC
  Filled 2023-10-24: qty 1, fill #0
  Filled 2023-10-31: qty 1, 90d supply, fill #0

## 2023-10-24 MED ORDER — DEXCOM G6 SENSOR MISC
1 refills | Status: DC
  Filled 2023-10-24: qty 3, fill #0
  Filled 2023-10-31: qty 3, 30d supply, fill #0

## 2023-10-24 NOTE — Progress Notes (Signed)
10/24/2023 Name: Dylan Abbott MRN: [id number] DOB: December 05, 1966  Chief Complaint  Patient presents with   Diabetes   Medication Management    Dylan Abbott is a 56 y.o. year old male who presented for a telephone visit.   They were referred to the pharmacist by their PCP for assistance in managing diabetes, medication access, and complex medication management.    Subjective:  Medication Access/Adherence  Follow up with patient today regarding medication costs and Continuous Glucose Monitor.  He was not able to get Freestyle Libre 3 plus sensors - per his insurance his plan prefers DexCom G6.  Cost of Libre sensors was > $200.   Verified at last visit that patient has met his yearly $2000 deductible in 2024 so all meds on his plans formulary should be $0 thru 12/19/2023 . Deductible will restart at beginning of 2025.   At his last visit we discuss possibly using [facility] pharmacy's discount drug list which might be lower cost than using his insurance since he has $2000 deductible.  Reviewed list for his current medications:   $5 List = $5 per 30 days or $15 per 90 days: simvastatin (atorvastatin on $10 list), metformin, omeprazole  $10 List = $10 per 30 days or $30 per 90days: glipizide, potassium, losartan (because on 100mg  and 25mg )  Ezetimibe is not on discount list.    Estimate that if patient gets meds thru [facility] pharmacy off insurance and uses $35 / month coupon card for Lantus, then total cost per year would only be about $1100 which is less than his $2000 deductible. Patient states today that he would like to use [facility] Delivery Option.   The following medications will be due in the next 2 to 3 weeks - glipizide, metformin and potassium  Current Pharmacy:  [facility] #[id number] [place], [place] - [address] AT [facility] OF [facility] & [name] [address] Phone: [phone] Fax: [phone]  [facility] [address] [place] [address] Phone: [phone] Fax: [phone]   Patient reports affordability concerns with their medications: Yes  - only when he has not met deductible. Currently meds are $0 because he met his deductible this year.  Patient reports access/transportation concerns to their pharmacy: No  Patient reports adherence concerns with their medications:  No   - but refill history does show some inconsistencies in regular refills   Diabetes:  Current medications:  Lantus 14 units daily (takes around 8am when he returns home from work) ; metformin 850mg  twice a day and glipizide 10mg  daily.   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  Usually testing 1 or 2 times daily   Patient denies hypoglycemic s/sx including no dizziness, shakiness, sweating. Reports blood glucose has never been < 100   Hyperlipidemia/ASCVD Risk Reduction  Current lipid lowering medications: ezetimibe 10mg  daily; atorvastatin 40mg  daily and simvastatin 80mg  daily are both on his med list - but patient reports he is taking atorvastatin 40mg  daily - he reports today that he has a note on simvastatin 80mg  that Dr Neva Seat told him not to take it.   Antiplatelet regimen: aspirin 81mg  daily   ASCVD History: MI and possible TIA  Risk Factors: male, age, type 2 DM, hypertension   Hypertension:   Current  medications: losartan 100mg  daily (per patient he stopped losartan 25mg  daily)  Current home blood pressure readings: not checking - does not have home blood pressure cuff.   BP Readings from Last 3 Encounters:  10/11/23 130/78  09/27/23 136/84  09/13/23 (!) 160/90     Objective:  Lab Results  Component Value Date   HGBA1C 12.2 (H) 07/18/2023   HGBA1C 12.1 (H) 07/18/2023    Lab Results  Component Value Date   CREATININE 1.03 09/13/2023   BUN  18 09/13/2023   NA 138 09/13/2023   K 3.4 (L) 09/13/2023   CL 100 09/13/2023   CO2 30 09/13/2023    Lab Results  Component Value Date   CHOL 137 09/13/2023   HDL 57.60 09/13/2023   LDLCALC 65 09/13/2023   TRIG 74.0 09/13/2023   CHOLHDL 2 09/13/2023    Medications Reviewed Today     Reviewed by Henrene Pastor, RPH-CPP (Pharmacist) on 10/24/23 at 1207  Med List Status: <None>   Medication Order Taking? Sig Documenting Provider Last Dose Status Informant  Accu-Chek FastClix Lancets MISC [id number]  USE TO CHECK BLOOD SUGAR EVERY DAY Shade Flood, MD  Active Self  aspirin EC 81 MG tablet [id number]  Take 1 tablet (81 mg total) by mouth daily for 30 days, then as directed by provider. Swallow whole. Burnadette Pop, MD  Active   atorvastatin (LIPITOR) 40 MG tablet [id number]  Take 1 tablet (40 mg total) by mouth at bedtime.  Patient not taking: Reported on 10/20/2023   Shade Flood, MD  Active   Blood Glucose Monitoring Suppl DEVI [id number]  1 each by Does not apply route in the morning, at noon, and at bedtime. May substitute to any manufacturer covered by patient's insurance. Shade Flood, MD  Active   cetirizine (ZYRTEC) 10 MG tablet [id number]  Take 1 tablet (10 mg total) by mouth daily. Shade Flood, MD  Active   Continuous Glucose Sensor (FREESTYLE LIBRE 3 PLUS SENSOR) [place] [id number] No Change sensor every 15 days.  Patient not taking: Reported on 10/24/2023   Shade Flood, MD Not Taking Active   diphenhydrAMINE (BENADRYL) 25 MG tablet [id number]  Take 1 tablet (25 mg total) by mouth every 6 (six) hours as needed for itching. Take 1 to 2 tablets every 6 hours as needed for itching. Radford Pax, NP  Active   ezetimibe (ZETIA) 10 MG tablet [id number]  Take 1 tablet (10 mg total) by mouth daily. Shade Flood, MD  Active   glipiZIDE (GLUCOTROL) 10 MG tablet [id number]  Take 1 tablet (10 mg total) by mouth 2 (two) times daily before a meal. Shade Flood, MD   Active   insulin glargine (LANTUS) 100 UNIT/ML Solostar Pen [id number]  Inject 14 Units into the skin daily. Adjust dose as directed by medical provider until FBG is < 130 up to maximum dose of 25 units / day Shade Flood, MD  Active   Insulin Pen Needle (PEN NEEDLES) 32G X 4 MM MISC [id number]  1 application  by Does not apply route daily. Shade Flood, MD  Active   losartan (COZAAR) 100 MG tablet [id number]  Take 1 tablet (100 mg total) by mouth daily.  Patient taking differently: Take 100 mg by mouth daily. Taking 125 mg at this time after urgent care visit   Shade Flood, MD  Active   metFORMIN (GLUCOPHAGE) 850 MG tablet [id number]  Take 1 tablet (850 mg total) by mouth  2 (two) times daily with a meal. Shade Flood, MD  Active   omeprazole (PRILOSEC) 20 MG capsule [id number]  Take 1 capsule (20 mg total) by mouth daily. Shade Flood, MD  Active   potassium chloride (KLOR-CON) 10 MEQ tablet [id number]  Take 2 tablets (20 mEq total) by mouth daily. Shade Flood, MD  Active   simvastatin (ZOCOR) 80 MG tablet [id number]  Take 80 mg by mouth daily. [provider]  Active   triamcinolone cream (KENALOG) 0.1 % [id number]  Apply 1 Application topically 2 (two) times daily. Shade Flood, MD  Active               Assessment/Plan:  Medication Management:  - Will request Continuous Glucose Monitor change to DexCom 6 - will need sensor and transmitter since the [place] 3 plus was not covered by his inusrance plan.  I think using Continuous Glucose Monitor would be beneficial for Mr. Vahle. Since he has met his 2024 deductible I think could at least thru then end of 2024 would be affordable. Even if he only uses for 2 months, I think this would give his medical team and the patient some good information regarding blood glucose trends and help with adjusting medication therapy. - Will request [facility] to transfer metformin, potassium and glipizide from  [facility] and fill. Patient is provided with their phone number [phone] to follow up on providing payment information (If needed) and verifying his delivery address.   Diabetes:Currently uncontrolled - Reviewed long term cardiovascular and renal outcomes of uncontrolled blood sugar - Recommend to continue current regimen until we can get more blood glucose readings.  - Recommend to check glucose 2 times per day. Recommending checking FBG when he wakes in the afternoon (he has night shift job)   Hypertension: - Currently controlled - Reviewed long term cardiovascular and renal outcomes of uncontrolled blood pressure - Recommend to continue losartan 100mg  daily    Hyperlipidemia/ASCVD Risk Reduction: - Currently LDL is at goal of < 70; Tg at goal of < 150 - Continue to take atorvastatin 40mg  daily     Follow Up Plan: 1 week to make sure patient receives medication and check on Continuous Glucose Monitor. Will make face to face visit when he receives Continuous Glucose Monitor sensors for instructions.    Henrene Pastor, PharmD Clinical Pharmacist [facility] Primary Care  Population Health [phone]

## 2023-10-25 ENCOUNTER — Other Ambulatory Visit (HOSPITAL_COMMUNITY): Payer: Self-pay

## 2023-10-30 ENCOUNTER — Ambulatory Visit: Payer: 59

## 2023-10-30 DIAGNOSIS — I639 Cerebral infarction, unspecified: Secondary | ICD-10-CM | POA: Diagnosis not present

## 2023-10-31 ENCOUNTER — Other Ambulatory Visit: Payer: 59 | Admitting: Pharmacist

## 2023-10-31 ENCOUNTER — Other Ambulatory Visit (HOSPITAL_COMMUNITY): Payer: Self-pay

## 2023-10-31 ENCOUNTER — Other Ambulatory Visit: Payer: Self-pay | Admitting: Family Medicine

## 2023-10-31 DIAGNOSIS — E1165 Type 2 diabetes mellitus with hyperglycemia: Secondary | ICD-10-CM

## 2023-10-31 DIAGNOSIS — I10 Essential (primary) hypertension: Secondary | ICD-10-CM

## 2023-10-31 DIAGNOSIS — N1832 Chronic kidney disease, stage 3b: Secondary | ICD-10-CM

## 2023-10-31 LAB — CUP PACEART REMOTE DEVICE CHECK
Date Time Interrogation Session: 20241110232458
Implantable Pulse Generator Implant Date: 20240730

## 2023-10-31 NOTE — Progress Notes (Signed)
10/31/2023 Name: Dylan Abbott MRN: [id number] DOB: March 06, 1966  Chief Complaint  Patient presents with   Medication Management   Diabetes    Dylan Abbott is a 57 y.o. year old male who presented for a telephone visit.   They were referred to the pharmacist by their PCP for assistance in managing diabetes, medication access, and complex medication management.    Subjective:  Medication Access/Adherence  Follow up with patient today regarding medication costs and Continuous Glucose Monitor.  He was not able to get Freestyle Libre 3 plus sensors - per his insurance his plan prefers DexCom G6. Dr Dylan Abbott sent in Rx for [facility] to [facility] pharmacy but patient went to [facility]. [facility] dispensed the [place] 2 sensor and patient paid $200 for 3 months.    Patient has met his yearly $2000 deductible in 2024 so all meds on his plans formulary should be $0 thru 12/19/2023 . Deductible will restart at beginning of 2025.   At a previous visit we discuss possibly using [facility] pharmacy's discount drug list which might be lower cost than using his insurance since he has $2000 deductible.  Reviewed list for his current medications:   $5 List = $5 per 30 days or $15 per 90 days: simvastatin (atorvastatin on $10 list), metformin, omeprazole  $10 List = $10 per 30 days or $30 per 90days: glipizide, potassium, losartan (because on 100mg  and 25mg )  Ezetimibe is not on discount list.    Estimate that if patient gets meds thru [facility] pharmacy off insurance and uses $35 / month coupon card for Lantus, then total cost per year would only be about $1100 which is less than his $2000 deductible. Patient states today that he would like to use [facility] Delivery Option.   Patient is planning to transition meds over the [name] to help with cost and for delivery.   Patient reports he will need refill for metformin soon   Current Pharmacy:  [id number] [place], [place] - [address] AT [facility] OF [facility] & [address] [place] [address] Phone: [phone] Fax: [phone]  [place] [facility] [address] [place] [address] Phone: [phone] Fax: [phone]   Patient reports affordability concerns with their medications: Yes  - only when he has not met deductible. Currently meds are $0 because he met his deductible this year.  Patient reports access/transportation concerns to their pharmacy: No  Patient reports adherence concerns with their medications:  No   - but refill history does show some inconsistencies in regular refills   Diabetes:  Current medications:  Lantus 14 units daily (takes around 8am when he returns home from work) ; metformin 850mg  twice a day and glipizide 10mg  daily.    Current glucose readings range from 120 to 200 (these numbers are when he gets home from work - works night shift - 7pm to 7am.  Usually testing 1 or 2 times daily   Patient denies hypoglycemic s/sx including no dizziness, shakiness, sweating. Reports blood glucose has never been < 100   Hyperlipidemia/ASCVD Risk Reduction  Current lipid lowering medications: ezetimibe 10mg  daily; atorvastatin 40mg  daily     Antiplatelet regimen: aspirin 81mg  daily   ASCVD History: MI and possible TIA  Risk Factors: male, age, type 2 DM, hypertension   Hypertension:   Current medications: losartan 100mg  daily (per patient he stopped losartan 25mg  daily)  Current home blood pressure readings: not  checking - does not have home blood pressure cuff.   BP Readings from Last 3 Encounters:  10/11/23 130/78  09/27/23 136/84  09/13/23 (!) 160/90     Objective:  Lab Results  Component Value Date   HGBA1C 12.2 (H) 07/18/2023   HGBA1C 12.1 (H) 07/18/2023    Lab Results  Component Value Date   CREATININE 1.03 09/13/2023   BUN 18 09/13/2023   NA 138 09/13/2023   K 3.4 (L) 09/13/2023   CL 100 09/13/2023    CO2 30 09/13/2023    Lab Results  Component Value Date   CHOL 137 09/13/2023   HDL 57.60 09/13/2023   LDLCALC 65 09/13/2023   TRIG 74.0 09/13/2023   CHOLHDL 2 09/13/2023    Medications Reviewed Today     Reviewed by Dylan Abbott, RPH-CPP (Pharmacist) on 10/31/23 at 1603  Med List Status: <None>   Medication Order Taking? Sig Documenting Provider Last Dose Status Informant  Accu-Chek FastClix Lancets MISC [id number] Yes USE TO CHECK BLOOD SUGAR EVERY DAY Shade Flood, MD Taking Active Self  aspirin EC 81 MG tablet [id number] Yes Take 1 tablet (81 mg total) by mouth daily for 30 days, then as directed by provider. Swallow whole. Burnadette Pop, MD Taking Active   atorvastatin (LIPITOR) 40 MG tablet [id number] Yes Take 1 tablet (40 mg total) by mouth at bedtime. Shade Flood, MD Taking Active   Blood Glucose Monitoring Suppl DEVI [id number] Yes 1 each by Does not apply route in the morning, at noon, and at bedtime. May substitute to any manufacturer covered by patient's insurance. Shade Flood, MD Taking Active   cetirizine (ZYRTEC) 10 MG tablet [id number]  Take 1 tablet (10 mg total) by mouth daily. Shade Flood, MD  Active   Continuous Glucose Sensor (DEXCOM G6 SENSOR) MISC [id number] No Use to check blood glucose continuously. Change sensor every 10 days.  Patient not taking: Reported on 10/31/2023   Shade Flood, MD Not Taking Active   Continuous Glucose Transmitter [facility] G6 TRANSMITTER) MISC [id number] No Use to check blood glucose continuously. Change transmitter every 90 days  Patient not taking: Reported on 10/31/2023   Shade Flood, MD Not Taking Active   diphenhydrAMINE (BENADRYL) 25 MG tablet [id number]  Take 1 tablet (25 mg total) by mouth every 6 (six) hours as needed for itching. Take 1 to 2 tablets every 6 hours as needed for itching. Dylan Pax, NP  Active   ezetimibe (ZETIA) 10 MG tablet [id number] Yes Take 1 tablet (10 mg total) by mouth daily.  Shade Flood, MD Taking Active   glipiZIDE (GLUCOTROL) 10 MG tablet [id number] Yes Take 1 tablet (10 mg total) by mouth 2 (two) times daily before a meal. Shade Flood, MD Taking Active   insulin glargine (LANTUS) 100 UNIT/ML Solostar Pen [id number] Yes Inject 14 Units into the skin daily. Adjust dose as directed by medical provider until FBG is < 130 up to maximum dose of 25 units / day Shade Flood, MD Taking Active   Insulin Pen Needle (PEN NEEDLES) 32G X 4 MM MISC [id number] Yes 1 application  by Does not apply route daily. Shade Flood, MD Taking Active   losartan (COZAAR) 100 MG tablet [id number] Yes Take 1 tablet (100 mg total) by mouth daily. Shade Flood, MD Taking Active   metFORMIN (GLUCOPHAGE) 850 MG tablet [id number] Yes Take 1 tablet (850 mg total) by mouth 2 (two) times daily with a meal.  Shade Flood, MD Taking Active   omeprazole (PRILOSEC) 20 MG capsule [id number] No Take 1 capsule (20 mg total) by mouth daily.  Patient not taking: Reported on 10/24/2023   Shade Flood, MD Not Taking Active   potassium chloride (KLOR-CON) 10 MEQ tablet [id number] Yes Take 2 tablets (20 mEq total) by mouth daily.  Patient taking differently: Take 10 mEq by mouth 2 (two) times daily.   Shade Flood, MD Taking Active   triamcinolone cream (KENALOG) 0.1 % [id number]  Apply 1 Application topically 2 (two) times daily. Shade Flood, MD  Active               Assessment/Plan:  Medication Management:  - Called [facility] to see if they would take back and refund Mr. Hargan for the [place] 3 sensors. They are unable to take them back. Plan to still use the [employer]. Appointment made for patient to come to office for education on how to use sensors. 11/03/2023 at 2pm - called [employer] - they ran [facility] G6 sensors and transmitters - both were $0 copay. They will fill and mail to patient. Plan to start once he uses current stock of Libre 3's.  [facility] will also transfer patient's profile from [facility] Currently he need metformin filled and [facility] will fill and mail metformin to patient.    Diabetes:Currently uncontrolled - Reviewed long term cardiovascular and renal outcomes of uncontrolled blood sugar - Recommend to continue current regimen until we can get more blood glucose readings.  - Recommend to check glucose 2 times per day. Recommending checking FBG when he wakes in the afternoon (he has night shift job). Will be able to better assess blood glucose trends when we start Continuous Glucose Monitor later this week.   Hypertension: - Currently controlled - Reviewed long term cardiovascular and renal outcomes of uncontrolled blood pressure - Recommend to continue losartan 100mg  daily    Hyperlipidemia/ASCVD Risk Reduction: - Currently LDL is at goal of < 70; Tg at goal of < 150 - Continue to take atorvastatin 40mg  daily     Follow Up Plan: Friday, Nov 15th at 2pm.    Dylan Abbott, PharmD Clinical Pharmacist [facility]  Population Health [phone]

## 2023-11-01 ENCOUNTER — Other Ambulatory Visit: Payer: Self-pay

## 2023-11-01 ENCOUNTER — Other Ambulatory Visit (HOSPITAL_COMMUNITY): Payer: Self-pay

## 2023-11-02 ENCOUNTER — Other Ambulatory Visit (HOSPITAL_COMMUNITY): Payer: Self-pay

## 2023-11-02 ENCOUNTER — Other Ambulatory Visit: Payer: 59 | Admitting: Pharmacist

## 2023-11-02 ENCOUNTER — Other Ambulatory Visit: Payer: Self-pay

## 2023-11-02 MED ORDER — TRULICITY 1.5 MG/0.5ML ~~LOC~~ SOAJ
1.5000 mg | SUBCUTANEOUS | 2 refills | Status: DC
  Filled 2023-11-02: qty 2, 28d supply, fill #0
  Filled 2023-12-12 – 2023-12-18 (×2): qty 2, 28d supply, fill #1

## 2023-11-02 MED ORDER — UNIFINE PENTIPS 32G X 4 MM MISC
3 refills | Status: DC
  Filled 2023-11-02: qty 100, 90d supply, fill #0
  Filled 2024-06-11: qty 100, 90d supply, fill #1

## 2023-11-02 MED ORDER — POTASSIUM CHLORIDE ER 10 MEQ PO TBCR
20.0000 meq | EXTENDED_RELEASE_TABLET | Freq: Every day | ORAL | 1 refills | Status: DC
  Filled 2023-11-02: qty 180, 90d supply, fill #0

## 2023-11-02 MED ORDER — LOSARTAN POTASSIUM 100 MG PO TABS
100.0000 mg | ORAL_TABLET | Freq: Every day | ORAL | 0 refills | Status: DC
  Filled 2023-12-12: qty 90, 90d supply, fill #0

## 2023-11-02 MED ORDER — DIPHENHYDRAMINE HCL 25 MG PO TABS: 25.0000 mg | ORAL_TABLET | Freq: Four times a day (QID) | ORAL | 0 refills | Status: DC | PRN

## 2023-11-02 MED ORDER — NIFEDIPINE ER 90 MG PO TB24
90.0000 mg | ORAL_TABLET | Freq: Every day | ORAL | 1 refills | Status: DC
  Filled 2023-11-02: qty 90, 90d supply, fill #0

## 2023-11-02 MED ORDER — EZETIMIBE 10 MG PO TABS
10.0000 mg | ORAL_TABLET | Freq: Every day | ORAL | 1 refills | Status: DC
  Filled 2024-01-16: qty 90, 90d supply, fill #0

## 2023-11-02 MED ORDER — INSULIN GLARGINE 100 UNIT/ML SOLOSTAR PEN
12.0000 [IU] | PEN_INJECTOR | Freq: Every day | SUBCUTANEOUS | 0 refills | Status: DC
  Filled 2023-11-02: qty 3, 25d supply, fill #0
  Filled 2024-03-22: qty 12, 75d supply, fill #0

## 2023-11-02 MED ORDER — ATORVASTATIN CALCIUM 40 MG PO TABS
40.0000 mg | ORAL_TABLET | Freq: Every day | ORAL | 1 refills | Status: DC
  Filled 2023-12-12: qty 90, 90d supply, fill #0

## 2023-11-03 ENCOUNTER — Telehealth: Payer: Self-pay | Admitting: Pharmacist

## 2023-11-03 ENCOUNTER — Encounter: Payer: Self-pay | Admitting: Pharmacist

## 2023-11-03 ENCOUNTER — Other Ambulatory Visit: Payer: Self-pay | Admitting: Pharmacist

## 2023-11-03 NOTE — Telephone Encounter (Signed)
Ok to close referral

## 2023-11-03 NOTE — Progress Notes (Signed)
11/03/2023 Name: Dylan Abbott MRN: [id number] DOB: 07-13-1966  Chief Complaint  Patient presents with   Diabetes    Cathy Fury is a 57 y.o. year old male who presented for a telephone visit.   They were referred to the pharmacist by their PCP for assistance in managing diabetes, medication access, and complex medication management.    Subjective:  Medication Access/Adherence  Follow up with patient today for education on Continuous Glucose Monitor and to review medications. Patient brings in all his medications  except Trulucity and Lantus because they are refrigerated.   Patient has received 6 Freestyle Libre 3 Plus sensors from [employer] but these are not preferred and he paid $200 for them.  His insurance plan prefers DexCom G6. He also had 1 month of DexCom filled recently at [facility]. For the rest of 2024 he pays nothing for daibetic supplies but he might have a $2000 deductible that restarts in January. We are planning to use the [place] sensors for the next 3 months and then transition over to the Dex Com G6.   Patient has met his yearly $2000 deductible in 2024 so all meds on his plans formulary should be $0 thru 12/19/2023 .   Current Pharmacy:  [facility] #[id number] [place], [place] - [address] AT [facility] OF [facility] [address] [place] [address] Phone: [phone] Fax: [phone]  [place] [facility] [address] [place] [address] Phone: [phone] Fax: [phone]   Patient reports affordability concerns with their medications: Yes  - only when he has not met deductible. Currently meds are $0 because he met his deductible this year.  Patient reports access/transportation concerns to their pharmacy: No  Patient reports adherence concerns with their medications:  No   - but refill history does show some inconsistencies in regular refills   Diabetes:  Current medications:  Lantus  14 units daily (takes around 8am when he returns home from work) ; metformin 850mg  twice a day and glipizide 10mg  daily.    Current glucose readings range from 120 to 200 (these numbers are when he gets home from work - works night shift - 7pm to 7am.  Usually testing 1 or 2 times daily   Blood glucose by fingerstick this morning per patient was 175,   Patient denies hypoglycemic s/sx including no dizziness, shakiness, sweating. Reports blood glucose has never been < 100   Hyperlipidemia/ASCVD Risk Reduction  Current lipid lowering medications: ezetimibe 10mg  daily; atorvastatin 40mg  daily     Antiplatelet regimen: aspirin 81mg  daily   ASCVD History: MI and possible TIA  Risk Factors: male, age, type 2 DM, hypertension   Hypertension:   Current medications: losartan 100mg  daily (per patient he stopped losartan 25mg  daily)  Current home blood pressure readings: not checking - does not have home blood pressure cuff.  He has been on nifedipine 90 mg in past but was stopped 09/2023  Blood pressure in office was elevated today but at last visit it was at goal. Patient does not have a home blood pressure monitor.   BP Readings from Last 3 Encounters:  11/03/23 (!) 145/85  10/11/23 130/78  09/27/23 136/84     Objective:  Lab Results  Component Value Date   HGBA1C 12.2 (H) 07/18/2023   HGBA1C 12.1 (H) 07/18/2023    Lab Results  Component Value Date   CREATININE 1.03 09/13/2023   BUN 18 09/13/2023   NA  138 09/13/2023   K 3.4 (L) 09/13/2023   CL 100 09/13/2023   CO2 30 09/13/2023    Lab Results  Component Value Date   CHOL 137 09/13/2023   HDL 57.60 09/13/2023   LDLCALC 65 09/13/2023   TRIG 74.0 09/13/2023   CHOLHDL 2 09/13/2023    Medications Reviewed Today     Reviewed by Henrene Pastor, RPH-CPP (Pharmacist) on 11/03/23 at 1459  Med List Status: <None>   Medication Order Taking? Sig Documenting Provider Last Dose Status Informant  Accu-Chek FastClix  Lancets MISC [id number]  USE TO CHECK BLOOD SUGAR EVERY DAY Shade Flood, MD  Active Self  aspirin EC 81 MG tablet [id number] Yes Take 1 tablet (81 mg total) by mouth daily for 30 days, then as directed by provider. Swallow whole. Burnadette Pop, MD Taking Active   atorvastatin (LIPITOR) 40 MG tablet [id number] Yes Take 1 tablet (40 mg total) by mouth at bedtime. Shade Flood, MD Taking Active   Blood Glucose Monitoring Suppl DEVI [id number] No 1 each by Does not apply route in the morning, at noon, and at bedtime. May substitute to any manufacturer covered by patient's insurance.  Patient not taking: Reported on 11/03/2023   Shade Flood, MD Not Taking Active   cetirizine (ZYRTEC) 10 MG tablet [id number] Yes Take 1 tablet (10 mg total) by mouth daily.  Patient taking differently: Take 10 mg by mouth daily as needed for allergies.   Shade Flood, MD Taking Active   Continuous Glucose Sensor (DEXCOM G6 SENSOR) [place] [id number]  Use to check blood glucose continuously. Change sensor every 10 days.  Patient not taking: Reported on 10/31/2023   Shade Flood, MD  Active   Continuous Glucose Transmitter (DEXCOM G6 TRANSMITTER) [place] [id number]  Use to check blood glucose continuously. Change transmitter every 90 days  Patient not taking: Reported on 10/31/2023   Shade Flood, MD  Active   diphenhydrAMINE (BENADRYL ALLERGY) 25 MG tablet [id number] Yes Take 1 tablet (25 mg total) by mouth every 6 (six) hours as needed for itching.  Taking Active   Dulaglutide (TRULICITY) 1.5 MG/0.5ML SOAJ [id number] Yes Inject 1.5 mg into the skin every 7 (seven) days. Shade Flood, MD Taking Active   ezetimibe (ZETIA) 10 MG tablet [id number] Yes Take 1 tablet (10 mg total) by mouth daily. Shade Flood, MD Taking Active   glipiZIDE (GLUCOTROL) 10 MG tablet [id number] Yes Take 1 tablet (10 mg total) by mouth 2 (two) times daily before a meal. Shade Flood, MD Taking Active   insulin  glargine (LANTUS) 100 UNIT/ML Solostar Pen [id number] Yes Inject 12 Units into the skin daily. Shade Flood, MD Taking Active   Insulin Pen Needle (UNIFINE PENTIPS) 32G X 4 MM MISC [id number] Yes Use daily as directed Shade Flood, MD Taking Active   losartan (COZAAR) 100 MG tablet [id number]  Take 1 tablet (100 mg total) by mouth daily. Shade Flood, MD  Active   losartan (COZAAR) 100 MG tablet [id number] Yes Take 1 tablet (100 mg total) by mouth daily. Shade Flood, MD Taking Active   metFORMIN (GLUCOPHAGE) 850 MG tablet [id number] Yes Take 1 tablet (850 mg total) by mouth 2 (two) times daily with a meal. Shade Flood, MD Taking Active   omeprazole (PRILOSEC) 20 MG capsule [id number] No Take 1 capsule (20 mg total) by mouth daily.  Patient not taking: Reported on 10/24/2023   Shade Flood, MD Not Taking Active  potassium chloride (KLOR-CON) 10 MEQ tablet [id number] Yes Take 2 tablets (20 mEq total) by mouth daily. Shade Flood, MD Taking Active   triamcinolone cream (KENALOG) 0.1 % [id number]  Apply 1 Application topically 2 (two) times daily. Shade Flood, MD  Active               Assessment/Plan:  Medication Management:  - reviewed each medication with patient.   Diabetes:Currently uncontrolled - Reviewed long term cardiovascular and renal outcomes of uncontrolled blood sugar - Recommend to continue current regimen until we can get more blood glucose readings. - Patient received the following instruction for Freestyle Libre 3 Plus Personal CGM:   - preparation of placement site - clean with alcohol and allow to dry.  Sensor is to only be place on back of upper arm.  Patient to rotate sides and site.   -care of sensor and site   - reminded that sensor is waterproof up to 3 feet and for 30 minutes.   - Assisted in downloading [place] 3 app to her phone and with app / account set up.   - reviewed Libre 3 app home screen and how to read and respond to  trend arrows.   - reminded that when magnifying glass symbols shows up she is to confirm BG with finger stick before making any treatment decisions. .   - pt signed up for libre view in office. Linked with PCP office.    Hypertension: - Currently uncontrolled - Reviewed long term cardiovascular and renal outcomes of uncontrolled blood pressure - Recommend to continue losartan 100mg  daily   He will see PCP next week, if blood pressure still elevated, consider changing losartan to valsartan 320mg  or add either CCB or diuretic.   Hyperlipidemia/ASCVD Risk Reduction: - Currently LDL is at goal of < 70; Tg at goal of < 150 - Continue to take atorvastatin 40mg  daily     Follow Up Plan: follow up phone call to check on blood glucose readings in 1 week. Henrene Pastor, PharmD Clinical Pharmacist [facility] Primary Care  Population Health [phone]

## 2023-11-03 NOTE — Telephone Encounter (Signed)
Reported feeling unsafe getting stuck in August.  If those symptoms have resolved I think it is reasonable to cancel GI referral.  Please call and clarify if he is having any of those symptoms, and if so would recommend follow-up with GI as planned.  Thanks

## 2023-11-03 NOTE — Telephone Encounter (Signed)
Patient was referred to GI for dysphagia. He reports that he has not had any problems with swallowing. Would you still like him to go see GI?

## 2023-11-03 NOTE — Telephone Encounter (Signed)
Patient has been contacted and reports he no longer is having difficulty with swallowing and would feel comfortable with closing the referral.

## 2023-11-04 NOTE — Progress Notes (Signed)
Note reviewed, and agree with documentation and plan. Signed,   Meredith Staggers, MD [facility], [facility] 11/04/23 8:21 AM

## 2023-11-06 ENCOUNTER — Other Ambulatory Visit: Payer: Self-pay | Admitting: Family Medicine

## 2023-11-06 ENCOUNTER — Other Ambulatory Visit: Payer: Self-pay

## 2023-11-06 ENCOUNTER — Encounter: Payer: Self-pay | Admitting: Family Medicine

## 2023-11-06 ENCOUNTER — Ambulatory Visit (INDEPENDENT_AMBULATORY_CARE_PROVIDER_SITE_OTHER): Payer: 59 | Admitting: Family Medicine

## 2023-11-06 ENCOUNTER — Other Ambulatory Visit (HOSPITAL_COMMUNITY): Payer: Self-pay

## 2023-11-06 VITALS — BP 158/80 | HR 71 | Temp 97.6°F | Ht 72.0 in | Wt 230.4 lb

## 2023-11-06 DIAGNOSIS — Z794 Long term (current) use of insulin: Secondary | ICD-10-CM | POA: Diagnosis not present

## 2023-11-06 DIAGNOSIS — I10 Essential (primary) hypertension: Secondary | ICD-10-CM | POA: Diagnosis not present

## 2023-11-06 DIAGNOSIS — R519 Headache, unspecified: Secondary | ICD-10-CM | POA: Diagnosis not present

## 2023-11-06 DIAGNOSIS — E1165 Type 2 diabetes mellitus with hyperglycemia: Secondary | ICD-10-CM | POA: Diagnosis not present

## 2023-11-06 DIAGNOSIS — Z7984 Long term (current) use of oral hypoglycemic drugs: Secondary | ICD-10-CM

## 2023-11-06 LAB — COMPREHENSIVE METABOLIC PANEL
ALT: 18 U/L (ref 0–53)
AST: 24 U/L (ref 0–37)
Albumin: 4 g/dL (ref 3.5–5.2)
Alkaline Phosphatase: 64 U/L (ref 39–117)
BUN: 22 mg/dL (ref 6–23)
CO2: 29 meq/L (ref 19–32)
Calcium: 9.6 mg/dL (ref 8.4–10.5)
Chloride: 94 meq/L — ABNORMAL LOW (ref 96–112)
Creatinine, Ser: 1.35 mg/dL (ref 0.40–1.50)
GFR: 58.02 mL/min — ABNORMAL LOW (ref >60.00)
Glucose, Bld: 357 mg/dL — ABNORMAL HIGH (ref 70–99)
Potassium: 3.3 meq/L — ABNORMAL LOW (ref 3.5–5.1)
Sodium: 133 meq/L — ABNORMAL LOW (ref 135–145)
Total Bilirubin: 0.5 mg/dL (ref 0.2–1.2)
Total Protein: 7 g/dL (ref 6.0–8.3)

## 2023-11-06 LAB — MICROALBUMIN / CREATININE URINE RATIO
Creatinine,U: 63.5 mg/dL
Microalb Creat Ratio: 6.3 mg/g (ref 0.0–30.0)
Microalb, Ur: 4 mg/dL — ABNORMAL HIGH (ref 0.0–1.9)

## 2023-11-06 LAB — GLUCOSE, POCT (MANUAL RESULT ENTRY): POC Glucose: 335 mg/dL — AB (ref 70–99)

## 2023-11-06 LAB — HEMOGLOBIN A1C: Hgb A1c MFr Bld: 11.3 % — ABNORMAL HIGH (ref 4.6–6.5)

## 2023-11-06 MED ORDER — METFORMIN HCL 850 MG PO TABS
850.0000 mg | ORAL_TABLET | Freq: Two times a day (BID) | ORAL | 1 refills | Status: DC
  Filled 2023-11-06: qty 180, 90d supply, fill #0

## 2023-11-06 MED ORDER — AMLODIPINE BESYLATE 2.5 MG PO TABS
2.5000 mg | ORAL_TABLET | Freq: Every day | ORAL | 1 refills | Status: DC
  Filled 2024-02-02: qty 90, 90d supply, fill #0

## 2023-11-06 NOTE — Patient Instructions (Addendum)
Add new med for blood pressure - amlodipine once per day. Continue losartan once per day. Improved blood pressure and blood sugar control should help headaches.   Increase insulin to 16 units. Watch for any low blood sugars. Make sure to drink regular meals. Keep follow up with pharmacist and I will see you in 2 weeks as well.   No other med changes for now.   Return to the clinic or go to the nearest emergency room if any of your symptoms worsen or new symptoms occur.

## 2023-11-06 NOTE — Progress Notes (Addendum)
Subjective:  Patient ID: Dylan Abbott, male    DOB: 1966/11/09  Age: 57 y.o. MRN: [id number]  CC:  Chief Complaint  Patient presents with   Diabetes    2 week recheck pt notes medication seems to give him a headache and wonders if this is normal, pt has been using a CBGM notes it is still high, 354 according to his monitor currently nothing to eat since yesterday, notes he does get down to about 150 in the afternoon most of the time     HPI Dylan Abbott presents for   Diabetes: Complicated by hyperglycemia, CVD with prior suspected TIA, difficulty with medication adherence previously.  Variable insulin dosing after misunderstanding of initial insulin dosing.  See prior visits.  As of his October 23 visit was taking just 12 units of insulin, had not increased from prior recommendations.  Blood sugars at that time ranging from 156-290 fasting.  He was taking glipizide 10 mg twice daily, metformin 850 mg twice daily, Lantus increased to 14 units.  He has met with clinical pharmacist since our last visit.  Started on CGM.  Most recent visit 3 days ago. Lantus 14 units per day.  Glipizide 2 times per day.  Metformin 2 times per day. Not on trulicity - difficulty obtaining Rx.  Blood sugar 354 this morning.  He reports readings in the 150s in the afternoons.  In office glucose 335. No lows - lowest 150.  200 yesterday in morning, works 7pm to 7 am. Sleeps 8am to about 2pm.  Has not taken insulin yet today - usually at 8am, missed dose 2 days ago, but usually takes daily. Last dose yesterday am.  No n/v.    Results for orders placed or performed in visit on 11/06/23  POCT glucose (manual entry)  Result Value Ref Range   POC Glucose 335 (A) 70 - 99 mg/dl     Lab Results  Component Value Date   HGBA1C 12.2 (H) 07/18/2023   HGBA1C 12.1 (H) 07/18/2023   HGBA1C 8.1 (H) 12/16/2022   Lab Results  Component Value Date   MICROALBUR 1.4 11/16/2022   LDLCALC 65 09/13/2023   CREATININE  1.03 09/13/2023   Headache With hypertension.  Elevated readings recently.  Continues on losartan 100 mg daily.  intermittent headache discussed last visit.  Improved at that time and nonfocal exam.  Question if this was related to his glycemic control.  Recommendation from pharmacist noted from November 15 with either changing losartan to valsartan 320 mg or adding CCB or diuretic. Denies missed doses of losartan.  More HA since last visit daily. Not worst HA of life. Top of head. No new blurry vision, speech difficulty or weakness. No treatment. Not persistent. Intermittent.  Taking potassium every day.   BP Readings from Last 3 Encounters:  11/06/23 (!) 144/80  11/03/23 (!) 145/85  10/11/23 130/78     History Patient Active Problem List   Diagnosis Date Noted   NSTEMI (non-ST elevated myocardial infarction) (HCC) 07/16/2023   Impingement syndrome of left shoulder 07/30/2020   COVID-19 virus infection 10/31/2019   Abnormal liver function 10/31/2019   Hypokalemia 10/31/2019   Hyponatremia 10/31/2019   Diabetes mellitus (HCC) 11/13/2017   Hyperlipidemia 11/13/2017   Hypertensive disorder 11/13/2017   Past Medical History:  Diagnosis Date   Diabetes mellitus without complication (HCC)    Hyperlipidemia    Hypertension    Past Surgical History:  Procedure Laterality Date   LOOP RECORDER INSERTION N/A  07/18/2023   Procedure: LOOP RECORDER INSERTION;  Surgeon: Graciella Freer, PA-C;  Location: [facility] INVASIVE CV LAB;  Service: Cardiovascular;  Laterality: N/A;   No Known Allergies Prior to Admission medications   Medication Sig Start Date End Date Taking? Authorizing Provider  Accu-Chek FastClix Lancets MISC USE TO CHECK BLOOD SUGAR EVERY DAY 09/08/21  Yes Shade Flood, MD  aspirin EC 81 MG tablet Take 1 tablet (81 mg total) by mouth daily for 30 days, then as directed by provider. Swallow whole. 07/19/23  Yes Burnadette Pop, MD  atorvastatin (LIPITOR) 40 MG  tablet Take 1 tablet (40 mg total) by mouth at bedtime. 08/03/23  Yes Shade Flood, MD  Blood Glucose Monitoring Suppl DEVI 1 each by Does not apply route in the morning, at noon, and at bedtime. May substitute to any manufacturer covered by patient's insurance. 07/27/23  Yes Shade Flood, MD  cetirizine (ZYRTEC) 10 MG tablet Take 1 tablet (10 mg total) by mouth daily. Patient taking differently: Take 10 mg by mouth daily as needed for allergies. 08/09/23  Yes Shade Flood, MD  Continuous Glucose Sensor (DEXCOM G6 SENSOR) MISC Use to check blood glucose continuously. Change sensor every 10 days. 10/24/23  Yes Shade Flood, MD  Continuous Glucose Transmitter (DEXCOM G6 TRANSMITTER) MISC Use to check blood glucose continuously. Change transmitter every 90 days 10/24/23  Yes Shade Flood, MD  diphenhydrAMINE (BENADRYL ALLERGY) 25 MG tablet Take 1 tablet (25 mg total) by mouth every 6 (six) hours as needed for itching. 08/07/23  Yes   Dulaglutide (TRULICITY) 1.5 MG/0.5ML SOAJ Inject 1.5 mg into the skin every 7 (seven) days. 12/16/22  Yes Shade Flood, MD  ezetimibe (ZETIA) 10 MG tablet Take 1 tablet (10 mg total) by mouth daily. 09/13/23  Yes Shade Flood, MD  glipiZIDE (GLUCOTROL) 10 MG tablet Take 1 tablet (10 mg total) by mouth 2 (two) times daily before a meal. 08/03/23  Yes Shade Flood, MD  insulin glargine (LANTUS) 100 UNIT/ML Solostar Pen Inject 12 Units into the skin daily. 09/13/23  Yes Shade Flood, MD  Insulin Pen Needle (UNIFINE PENTIPS) 32G X 4 MM MISC Use daily as directed 08/03/23  Yes Shade Flood, MD  losartan (COZAAR) 100 MG tablet Take 1 tablet (100 mg total) by mouth daily. 08/03/23  Yes Shade Flood, MD  losartan (COZAAR) 100 MG tablet Take 1 tablet (100 mg total) by mouth daily. 08/03/23  Yes Shade Flood, MD  metFORMIN (GLUCOPHAGE) 850 MG tablet Take 1 tablet (850 mg total) by mouth 2 (two) times daily with a meal. 08/03/23  Yes Shade Flood, MD  omeprazole (PRILOSEC) 20 MG capsule Take 1 capsule (20 mg total) by mouth daily. 10/20/23  Yes Shade Flood, MD  potassium chloride (KLOR-CON) 10 MEQ tablet Take 2 tablets (20 mEq total) by mouth daily. 07/10/23  Yes Shade Flood, MD  triamcinolone cream (KENALOG) 0.1 % Apply 1 Application topically 2 (two) times daily. 09/27/23  Yes Shade Flood, MD   Social History   Socioeconomic History   Marital status: Married    Spouse name: Not on file   Number of children: 2   Years of education: Not on file   Highest education level: Not on file  Occupational History   Occupation: environmental  Tobacco Use   Smoking status: Never   Smokeless tobacco: Never  Substance and Sexual Activity   Alcohol use: Never  Alcohol/week: 0.0 standard drinks of alcohol   Drug use: Never   Sexual activity: Yes  Other Topics Concern   Not on file  Social History Narrative   Not on file   Social Determinants of Health   Financial Resource Strain: Not on file  Food Insecurity: No Food Insecurity (07/17/2023)   Hunger Vital Sign    Worried About Running Out of Food in the Last Year: Never true    Ran Out of Food in the Last Year: Never true  Transportation Needs: No Transportation Needs (07/17/2023)   PRAPARE - Administrator, Civil Service (Medical): No    Lack of Transportation (Non-Medical): No  Physical Activity: Not on file  Stress: Not on file  Social Connections: Not on file  Intimate Partner Violence: Not At Risk (07/17/2023)   Humiliation, Afraid, Rape, and Kick questionnaire    Fear of Current or Ex-Partner: No    Emotionally Abused: No    Physically Abused: No    Sexually Abused: No    Review of Systems  Constitutional:  Negative for fatigue and unexpected weight change.  Eyes:  Negative for visual disturbance.  Respiratory:  Negative for cough, chest tightness and shortness of breath.   Cardiovascular:  Negative for chest pain, palpitations  and leg swelling.  Gastrointestinal:  Negative for abdominal pain and blood in stool.  Neurological:  Positive for headaches. Negative for dizziness and light-headedness.     Objective:   Vitals:   11/06/23 0804  BP: (!) 144/80  Pulse: 71  Temp: 97.6 F (36.4 C)  TempSrc: Temporal  SpO2: 98%  Weight: 230 lb 6.4 oz (104.5 kg)  Height: 6' (1.829 m)     Physical Exam Vitals reviewed.  Constitutional:      Appearance: He is well-developed.  HENT:     Head: Normocephalic and atraumatic.  Neck:     Vascular: No carotid bruit or JVD.  Cardiovascular:     Rate and Rhythm: Normal rate and regular rhythm.     Heart sounds: Normal heart sounds. No murmur heard. Pulmonary:     Effort: Pulmonary effort is normal.     Breath sounds: Normal breath sounds. No rales.  Musculoskeletal:     Right lower leg: No edema.     Left lower leg: No edema.  Skin:    General: Skin is warm and dry.  Neurological:     Mental Status: He is alert and oriented to person, place, and time.     Comments: Nonfocal exam with normal speech, equal facial movements, equal strength and intact upper extremities lower extremities bilaterally.  Ambulating without assistive device, nonantalgic.    Psychiatric:        Mood and Affect: Mood normal.        Assessment & Plan:  Dylan Abbott is a 57 y.o. male . Type 2 diabetes mellitus with hyperglycemia, without long-term current use of insulin (HCC) - Plan: POCT glucose (manual entry), Microalbumin / creatinine urine ratio, Comprehensive metabolic panel, Hemoglobin A1c, Ambulatory referral to Endocrinology  -Uncontrolled.  Now with CGM which should be helpful monitoring glucose readings.  Elevated readings this morning but has not yet taken his insulin dosing.  Missed 1 dose on Saturday.  Does report some readings in the 150s, hesitant to increase his insulin abruptly but does appear to need improved control.  Will increase by 2 additional units at 16 units for  now.  Continue glipizide and metformin same doses.  May need to  look at getting back on GLP-1 but availability issues previously.  Follow-up with pharmacist as planned we may be able to assist with this medication.  If restarted GLP, may need to titrate off of the sulfonylurea.  2-week follow-up with ER/RTC precautions.  I will go ahead and refer him to endocrinology to assist with diabetes treatment, but will follow him up in the interim until he has that appointment.  Nonintractable episodic headache, unspecified headache type  -Nonfocal neuroexam.  Intermittent headache, could still be related to glycemic control as well as uncontrolled hypertension.  Will initially load up in 2.5 mg, continue losartan, keep follow-up with pharmacist and I will see him in 2 weeks.  ER precautions given.  Essential hypertension - Plan: amLODipine (NORVASC) 2.5 MG tablet, Comprehensive metabolic panel  -Uncontrolled as above, add amlodipine, continue same regimen otherwise and check labs.  2-week follow-up.  ER precautions.  Meds ordered this encounter  Medications   amLODipine (NORVASC) 2.5 MG tablet    Sig: Take 1 tablet (2.5 mg total) by mouth daily.    Dispense:  90 tablet    Refill:  1   Patient Instructions  Add new med for blood pressure - amlodipine once per day. Continue losartan once per day. Improved blood pressure and blood sugar control should help headaches.   Increase insulin to 16 units. Watch for any low blood sugars. Make sure to drink regular meals. Keep follow up with pharmacist and I will see you in 2 weeks as well.   No other med changes for now.   Return to the clinic or go to the nearest emergency room if any of your symptoms worsen or new symptoms occur.     Signed,   Meredith Staggers, MD [facility], [facility] 11/06/23 8:40 AM

## 2023-11-07 ENCOUNTER — Other Ambulatory Visit (HOSPITAL_COMMUNITY): Payer: Self-pay

## 2023-11-08 ENCOUNTER — Ambulatory Visit: Payer: 59 | Admitting: Gastroenterology

## 2023-11-09 ENCOUNTER — Telehealth: Payer: Self-pay

## 2023-11-09 ENCOUNTER — Other Ambulatory Visit: Payer: Self-pay | Admitting: Pharmacist

## 2023-11-09 ENCOUNTER — Other Ambulatory Visit (HOSPITAL_COMMUNITY): Payer: Self-pay

## 2023-11-09 NOTE — Progress Notes (Signed)
11/09/2023 Name: Dylan Abbott MRN: [id number] DOB: 03-Aug-1966  Chief Complaint  Patient presents with   Diabetes    Dylan Abbott is a 57 y.o. year old male who presented for a telephone visit.   They were referred to the pharmacist by their PCP for assistance in managing diabetes, medication access, and complex medication management.    Subjective:  Medication Access/Adherence  Following up with patient since we started Continuous Glucose Monitor - Libre last week.   Patient also saw Dr Neva Seat 11/06/2023 and dose of Lantus was increased from 12 units to 16 units daily.  Patient has Trulicity on his med list but reports he does not have at home. Looks like it was filled 11/02/2023 by [facility]. Spoke with pharmacist and it was delivered with a picture with delivery confirmation. Patient lives in an apartment building and not sure if maybe a neighbor got the package instead.   Patient has received 6 Freestyle Libre 3 Plus sensors from [employer] but these are not preferred and he paid $200 for them.  His insurance plan prefers DexCom G6. He also had 1 month of DexCom filled recently at [facility]. For the rest of 2024 he pays nothing for daibetic supplies but he might have a $2000 deductible that restarts in January. We are planning to use the [place] sensors for the next 3 months and then transition over to the Dex Com G6.   Patient has met his yearly $2000 deductible in 2024 so all meds on his plans formulary should be $0 thru 12/19/2023 .   Current Pharmacy:  [id number] [place], [place] - [address] AT [facility] [address] [place] [address] Phone: [phone] Fax: [phone]  [place] [facility] [address] [place] [address] Phone: [phone] Fax: [phone]   Patient reports affordability concerns with their medications: Yes  - only when he has not met  deductible. Currently meds are $0 because he met his deductible this year.  Patient reports access/transportation concerns to their pharmacy: No  Patient reports adherence concerns with their medications:  No   - but refill history does show some inconsistencies in regular refills   Diabetes:  Current medications:  Lantus 16 units daily (takes around 8am when he returns home from work) ; metformin 850mg  twice a day and glipizide 10mg  daily.  Trulicity 1.5mg  weekly - has not restarted yet because patient does not have medication on hand.   CGM Documentation:  Days Worn: 7 so far (recommend 14 days) % Time CGM is active: 43% (goal >=70%) Average Glucose: 276 mg/dL Glucose Management Indicator: 9.9 Glucose Variability: 29.1% (goal <36%) Time in Range:  - Time above range >250: 59% (typical goal: <5%) - Time above range 181-250: 26% (typical goal <20%) - Time in range 70-180 15% (typical goal >=70%) - Time below range 54-69: 0% (typical goal <4%) - Time below range < 54: 0% (typical goal <1%)  Trends: blood glucose is improved since increase in Lantus dose to 16 units. He is still having post prandial highs but I think this is because he has not taken Trulicity in > 1 month.  His lowest blood glucose was 97 which occurred last night around midnight.        Hyperlipidemia/ASCVD Risk Reduction  Current lipid lowering medications: ezetimibe 10mg  daily; atorvastatin 40mg  daily     Antiplatelet regimen: aspirin 81mg  daily   ASCVD  History: MI and possible TIA  Risk Factors: male, age, type 2 DM, hypertension   Hypertension:   Current medications: losartan 100mg  daily (per patient he stopped losartan 25mg  daily); amlodipine 2.5mg  daily (started 11/06/2023)  Current home blood pressure readings: not checking - does not have home blood pressure cuff.  He has been on nifedipine 90 mg in past but was stopped 09/2023   BP Readings from Last 3 Encounters:  11/06/23 (!) 158/80   11/03/23 (!) 145/85  10/11/23 130/78     Objective:  Lab Results  Component Value Date   HGBA1C 11.3 (H) 11/06/2023    Lab Results  Component Value Date   CREATININE 1.35 11/06/2023   BUN 22 11/06/2023   NA 133 (L) 11/06/2023   K 3.3 (L) 11/06/2023   CL 94 (L) 11/06/2023   CO2 29 11/06/2023    Lab Results  Component Value Date   CHOL 137 09/13/2023   HDL 57.60 09/13/2023   LDLCALC 65 09/13/2023   TRIG 74.0 09/13/2023   CHOLHDL 2 09/13/2023    Medications Reviewed Today   Medications were not reviewed in this encounter       Assessment/Plan:  Medication Management:  - reviewed each medication with patient.   Diabetes:Currently uncontrolled - Reviewed long term cardiovascular and renal outcomes of uncontrolled blood sugar - Patient to check with his neighbor and the office at his apartment to see if anyone else received his delivery of Trulucity 11/14. Per the pharmacy the picture they have of delivery there was an M on the door. Patient's apartment is [address]. Pharmacy is checking to see if there is a way to get Trulicity shipped out to patient again. Might have to consider lowering dose for 0.75mg  weekly  - Since pt had blood glucose of 97 last night will not increase Lantus yet. Plan to check back in 5 to 7 days to review blood glucose reading and adjust medications as needed.    Hypertension: - Currently uncontrolled - Reviewed long term cardiovascular and renal outcomes of uncontrolled blood pressure - Recommend to continue losartan 100mg  daily and amlodipine 2.5mg  daily   Hyperlipidemia/ASCVD Risk Reduction: - Currently LDL is at goal of < 70; Tg at goal of < 150 - Continue to take atorvastatin 40mg  daily     Follow Up Plan: follow up phone call in 5 to 7 days.    Henrene Pastor, PharmD Clinical Pharmacist [facility]  Population Health [phone]

## 2023-11-09 NOTE — Telephone Encounter (Signed)
When I called and spoke to patient he states he is currently taking potassium MEq 20 mg two times daily which would change the directions for this concern please advise

## 2023-11-09 NOTE — Telephone Encounter (Signed)
-----   Message from Shade Flood sent at 11/07/2023  4:43 PM EST ----- Urine test for protein was slightly elevated but higher readings previously.  We will continue to work on diabetes control to help minimize this from worsening.  Sugar was unfortunately very high at 357.  Sodium borderline low but when corrected for high blood sugar that is okay.   Potassium was borderline low as well.  Should be on 20 mEq potassium per day, have him double that for 1 day (40 mEq or 4 of the 10 mg pills once), then take 30 mEq total per day starting the following day.  Should recheck levels with lab visit in 1 week.  BMP for hypokalemia. 49-month blood sugar test still elevated as expected.  Medication changes as discussed at his most recent visit, follow-up with myself and pharmacist as planned.  Let me know if there are questions.

## 2023-11-09 NOTE — Telephone Encounter (Signed)
Noted.  If he is taking 20 Meq twice per day with this low reading, have him increase it to 3 pills/day and recheck levels within 1 week, let me know if there are questions.  Thanks

## 2023-11-10 ENCOUNTER — Other Ambulatory Visit (HOSPITAL_COMMUNITY): Payer: Self-pay

## 2023-11-10 NOTE — Telephone Encounter (Signed)
Called and LM to call back so we can discuss the recommendation from Dr Neva Seat

## 2023-11-13 NOTE — Telephone Encounter (Signed)
Called but I was unable to leave a voicemail today

## 2023-11-14 ENCOUNTER — Other Ambulatory Visit: Payer: Self-pay | Admitting: Pharmacist

## 2023-11-14 NOTE — Telephone Encounter (Signed)
left another message. Sending unable to contact letter due to question about medication

## 2023-11-14 NOTE — Telephone Encounter (Signed)
Letter out

## 2023-11-14 NOTE — Progress Notes (Signed)
11/14/2023 Name: Woods Mascola MRN: [id number] DOB: 1966-07-16  Chief Complaint  Patient presents with   Diabetes    Genovevo Gurung is a 57 y.o. year old male who presented for a telephone visit.   They were referred to the pharmacist by their PCP for assistance in managing diabetes, medication access, and complex medication management.    Subjective:  Medication Access/Adherence  Following up with patient since we started Continuous Glucose Monitor - Libre 2 weeks ago   Appointment with Dr Neva Seat 11/06/2023. Dose of Lantus was increased from 12 units to 16 units daily.   At our last visit patient has stated that he was not taking Trulicity because he had not received delivery of Trulicity at his home. Trulicity was filled 11/02/2023 by [facility]. Spoke with pharmacist and it was delivered with a picture with delivery confirmation. Patient lives in an apartment building and not sure if maybe a neighbor got the package instead. Today patient reports his neighbors nor the apartment office had received his Trulicity delivery. When I spoke with pharmacy last week they were checking with [facility] to see if his order could be reissued. Patient reports he has not received a delivery of Trulicity.   Patient is using Freestyle Libre 3 Plus sensors. He had purchased 6 sensors from [employer] but these are not preferred and he paid $200 for them.  His insurance plan prefers DexCom G6. He has had 1 month of DexCom filled recently at [facility]. For the rest of 2024 he pays nothing for diabetic supplies but he might have a $2000 deductible that restarts in January. Planning to use the Bowen sensors for the next 3 months and then transition over to the Dex Com G6.   Patient has met his yearly $2000 deductible in 2024 so all meds on his plans formulary should be $0 thru 12/19/2023 .   Current Pharmacy:  [id number] [place], [place] - [address]  AT [facility] & [address] [place] [address] Phone: [phone] Fax: [phone]  [place] [facility] [address] [place] [address] Phone: [phone] Fax: [phone]   Patient reports affordability concerns with their medications: Yes  - only when he has not met deductible. Currently meds are $0 because he met his deductible this year.  Patient reports access/transportation concerns to their pharmacy: No  Patient reports adherence concerns with their medications:  No   - but refill history does show some inconsistencies in regular refills   Diabetes:  Current medications:  Lantus 16 units daily (takes around 6pm when he leaves for work) ; metformin 850mg  twice a day and glipizide 10mg  daily.  Trulicity 1.5mg  weekly - has not restarted yet because patient does not have medication on hand.   CGM Documentation:  Days Worn: 12 days so far (recommend 14 days) % Time CGM is active: 79% (goal >=70%) Average Glucose: 256 mg/dL (previously was 932 mg/dL) Glucose Management Indicator: 9.4% (previously was 9.9%) Glucose Variability: 32.1% (previously was 29.1%) (goal <36%) Time in Range:  - Time above range >250: 49% (previously was 59%) (typical goal: <5%) - Time above range 181-250: 27% (previously was 26%) (typical goal <20%) - Time in range 70-180: 24% (previously was 15%) (typical goal >=70%) - Time below range 54-69: 0% (typical goal <4%) - Time below range < 54: 0% (typical goal <1%)  Trends: blood glucose is improved since increase in  Lantus dose to 16 units. He is still having post prandial highs but I think this is because he has not taken Trulicity in > 1 month.  He did have a reading of 98 last night while he was at work. Patient denies hypoglycemia symptoms - no dizziness, lightheadedness or sweating. He did eat an apple and blood glucose increased to 251 about 30 to 60 minutes after eating apple.          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  He has been on nifedipine 90 mg in past but was stopped 09/2023   BP Readings from Last 3 Encounters:  11/06/23 (!) 158/80  11/03/23 (!) 145/85  10/11/23 130/78     Objective:  Lab Results  Component Value Date   HGBA1C 11.3 (H) 11/06/2023    Lab Results  Component Value Date   CREATININE 1.35 11/06/2023   BUN 22 11/06/2023   NA 133 (L) 11/06/2023   K 3.3 (L) 11/06/2023   CL 94 (L) 11/06/2023   CO2 29 11/06/2023    Lab Results  Component Value Date   CHOL 137 09/13/2023   HDL 57.60 09/13/2023   LDLCALC 65 09/13/2023   TRIG 74.0 09/13/2023   CHOLHDL 2 09/13/2023    Medications Reviewed Today     Reviewed by Henrene Pastor, RPH-CPP (Pharmacist) on 11/14/23 at 1609  Med List Status: <None>   Medication Order Taking? Sig Documenting Provider Last Dose Status Informant  Accu-Chek FastClix Lancets MISC [id number] No USE TO CHECK BLOOD SUGAR EVERY DAY  Patient not taking: Reported on 11/09/2023   Shade Flood, MD Not Taking Active Self  amLODipine (NORVASC) 2.5 MG tablet [id number] Yes Take 1 tablet (2.5 mg total) by mouth daily. Shade Flood, MD Taking Active   aspirin EC 81 MG tablet [id number] Yes Take 1 tablet (81 mg total) by mouth daily for 30 days, then as directed by provider. Swallow whole. Burnadette Pop, MD Taking Active   atorvastatin (LIPITOR) 40 MG tablet [id number] Yes Take 1 tablet (40 mg total) by mouth at bedtime. Shade Flood, MD Taking Active   Blood Glucose Monitoring  Suppl DEVI [id number] No 1 each by Does not apply route in the morning, at noon, and at bedtime. May substitute to any manufacturer covered by patient's insurance.  Patient not taking: Reported on 11/09/2023   Shade Flood, MD Not Taking Active   cetirizine (ZYRTEC) 10 MG tablet [id number]  Take 1 tablet (10 mg total) by mouth daily.  Patient taking differently: Take 10 mg by mouth daily as needed for allergies.   Shade Flood, MD  Active   Continuous Glucose Sensor (DEXCOM G6 SENSOR) MISC [id number] No Use to check blood glucose continuously. Change sensor every 10 days.  Patient not taking: Reported on 11/09/2023   Shade Flood, MD Not Taking Active   Continuous Glucose Transmitter [facility] G6 TRANSMITTER) MISC [id number] No Use to check blood glucose continuously. Change transmitter every 90 days  Patient not taking: Reported on 11/09/2023   Shade Flood, MD Not Taking Active   diphenhydrAMINE (BENADRYL ALLERGY) 25  MG tablet [id number]  Take 1 tablet (25 mg total) by mouth every 6 (six) hours as needed for itching.   Active   Dulaglutide (TRULICITY) 1.5 MG/0.5ML SOAJ [id number] No Inject 1.5 mg into the skin every 7 (seven) days.  Patient not taking: Reported on 11/09/2023   Shade Flood, MD Not Taking Active   ezetimibe (ZETIA) 10 MG tablet [id number] Yes Take 1 tablet (10 mg total) by mouth daily. Shade Flood, MD Taking Active   glipiZIDE (GLUCOTROL) 10 MG tablet [id number] Yes Take 1 tablet (10 mg total) by mouth 2 (two) times daily before a meal. Shade Flood, MD Taking Active   insulin glargine (LANTUS) 100 UNIT/ML Solostar Pen [id number] Yes Inject 12 Units into the skin daily.  Patient taking differently: Inject 16 Units into the skin daily.   Shade Flood, MD Taking Active   Insulin Pen Needle (UNIFINE PENTIPS) 32G X 4 MM MISC [id number] Yes Use daily as directed Shade Flood, MD Taking Active   losartan (COZAAR) 100 MG tablet [id number]  Take 1  tablet (100 mg total) by mouth daily. Shade Flood, MD  Active   losartan (COZAAR) 100 MG tablet [id number] Yes Take 1 tablet (100 mg total) by mouth daily. Shade Flood, MD Taking Active   metFORMIN (GLUCOPHAGE) 850 MG tablet [id number] Yes Take 1 tablet (850 mg total) by mouth 2 (two) times daily with a meal. Shade Flood, MD Taking Active   omeprazole (PRILOSEC) 20 MG capsule [id number] Yes Take 1 capsule (20 mg total) by mouth daily. Shade Flood, MD Taking Active   potassium chloride (KLOR-CON) 10 MEQ tablet [id number] Yes Take 2 tablets (20 mEq total) by mouth daily.  Patient taking differently: Take 30 mEq by mouth daily.   Shade Flood, MD Taking Active            Med Note [facility] [place] [facility]   Thu Nov 09, 2023  2:40 PM) Dose increased per lab notes 11/06/23  triamcinolone cream (KENALOG) 0.1 % [id number]  Apply 1 Application topically 2 (two) times daily. Shade Flood, MD  Active               Assessment/Plan:  Medication Management:  - reviewed each medication with patient.   Diabetes:Currently uncontrolled - but blood glucose improving per Continuous Glucose Monitor report - Reviewed long term cardiovascular and renal outcomes of uncontrolled blood sugar - Patient will call [facility] regarding Trulicity 1.5mg  delivery. Provided phone number. If they are not able to send replacement delivery   - could consider Rx for Trulicity 0.75mg  weekly x 4 weeks and then go back to 1.5mg  weekly  - Recommended increase Lantus to 18 units daily.   Hypertension: - Currently uncontrolled - Reviewed long term cardiovascular and renal outcomes of uncontrolled blood pressure - Recommend to continue losartan 100mg  daily and amlodipine 2.5mg  daily   Hyperlipidemia/ASCVD Risk Reduction: - Currently LDL is at goal of < 70; Tg at goal of < 150 - Continue to take atorvastatin 40mg  daily     Follow Up Plan: follow up phone call in 5 to 7 days.    Henrene Pastor, PharmD Clinical Pharmacist [facility]  Population Health [phone]

## 2023-11-23 NOTE — Progress Notes (Signed)
Carelink Summary Report / Loop Recorder 

## 2023-11-24 ENCOUNTER — Other Ambulatory Visit: Payer: Self-pay | Admitting: Pharmacist

## 2023-11-24 NOTE — Progress Notes (Signed)
11/24/2023 Name: Dylan Abbott MRN: [id number] DOB: 09/26/1966  No chief complaint on file.   Dylan Abbott is a 57 y.o. year old male who presented for a telephone visit.   They were referred to the pharmacist by their PCP for assistance in managing diabetes, medication access, and complex medication management.    Subjective:  Medication Access/Adherence  Following up with patient since we started Continuous Glucose Monitor - Libre Continuous Glucose Monitor - 2 weeks ago     Patient is using Freestyle Libre 3 Plus sensors. He had purchased 6 sensors from [employer] but these are not preferred and he paid $200 for them.  His insurance plan prefers DexCom G6. He has had 1 month of DexCom filled recently at [facility]. For the rest of 2024 he pays nothing for diabetic supplies but he might have a $2000 deductible that restarts in January. Planning to use the Dwight sensors for the next 3 months and then transition over to the Dex Com G6.   Patient has met his yearly $2000 deductible in 2024 so all meds on his plans formulary should be $0 thru 12/19/2023 .   Current Pharmacy:  [facility] #[id number] [place], [place] - [address] AT [facility] OF [facility] [address] [place] [address] Phone: [phone] Fax: [phone]  [place] [facility] [address] [place] [address] Phone: [phone] Fax: [phone]   Patient reports affordability concerns with their medications: Yes  - only when he has not met deductible. Currently meds are $0 because he met his deductible this year.  Patient reports access/transportation concerns to their pharmacy: No  Patient reports adherence concerns with their medications:  No   - but refill history does show some inconsistencies in regular refills   Diabetes:  Current medications:  Lantus 16 units daily (takes around 6pm when he leaves for work) ; metformin 850mg  twice a day  and glipizide 10mg  daily.   Trulicity 1.5mg  weekly - has not restarted yet because patient does not have medication on hand.  Trulicity was filled 11/02/2023 by [facility] but there was an issue with delivery. Patient did not receive at his home and pharmacy was not able to replace the missing medication.  Today patient reports he has 1 pen of Trulicity that he found in his refridgerator. It is a 0.75mg  pen. He checked expiration date and it was 04/2025.  Patient also found some older Lantus pens but when I had him check the date their were 07/19/2023 and 10/19/2023 - recommended not using these Lantus pens.   CGM Documentation:  Days Worn: 14 days - last sensor ended 11/18/2023  - patient has not placed new sensor as he is worried he will do incorrectly.  days so far  % Time CGM is active: 98% (goal >=70%) Average Glucose:  261 mg/dL (previously was 188 mg/dL) Glucose Management Indicator: 9.6% (previously was 9.4%) Glucose Variability: 32.1% (previously was 32.1%) (goal <36%) Time in Range:  - Time above range >250: 52% (previously was 49%) (typical goal: <5%) - Time above range 181-250: 26% (previously was 27%) (typical goal <20%) - Time in range 70-180: 22% (previously was 24%) (typical goal >=70%) - Time below range 54-69: 0% (typical goal <4%) - Time below range < 54: 0% (typical goal <1%)  Trends: blood glucose has not change since our last visit. He is still having post prandial highs but I think this is  because he has not taken Trulicity in > 3 months.        Hyperlipidemia/ASCVD Risk Reduction  Current lipid lowering medications: ezetimibe 10mg  daily; atorvastatin 40mg  daily     Antiplatelet regimen: aspirin 81mg  daily   ASCVD History: MI and possible TIA  Risk Factors: male, age, type 2 DM, hypertension   Hypertension:   Current medications: losartan 100mg  daily and amlodipine 2.5mg  daily (started 11/06/2023)  Current home blood pressure  readings: not checking - does not have home blood pressure cuff.  He has been on nifedipine 90 mg in past but was stopped 09/2023   BP Readings from Last 3 Encounters:  11/06/23 (!) 158/80  11/03/23 (!) 145/85  10/11/23 130/78     Objective:  Lab Results  Component Value Date   HGBA1C 11.3 (H) 11/06/2023    Lab Results  Component Value Date   CREATININE 1.35 11/06/2023   BUN 22 11/06/2023   NA 133 (L) 11/06/2023   K 3.3 (L) 11/06/2023   CL 94 (L) 11/06/2023   CO2 29 11/06/2023    Lab Results  Component Value Date   CHOL 137 09/13/2023   HDL 57.60 09/13/2023   LDLCALC 65 09/13/2023   TRIG 74.0 09/13/2023   CHOLHDL 2 09/13/2023    Medications Reviewed Today     Reviewed by Henrene Pastor, RPH-CPP (Pharmacist) on 11/24/23 at 1548  Med List Status: <None>   Medication Order Taking? Sig Documenting Provider Last Dose Status Informant  Accu-Chek FastClix Lancets MISC [id number] No USE TO CHECK BLOOD SUGAR EVERY DAY  Patient not taking: Reported on 11/24/2023   Shade Flood, MD Not Taking Active Self  amLODipine (NORVASC) 2.5 MG tablet [id number] Yes Take 1 tablet (2.5 mg total) by mouth daily. Shade Flood, MD Taking Active   aspirin EC 81 MG tablet [id number] Yes Take 1 tablet (81 mg total) by mouth daily for 30 days, then as directed by provider. Swallow whole. Burnadette Pop, MD Taking Active   atorvastatin (LIPITOR) 40 MG tablet [id number] Yes Take 1 tablet (40 mg total) by mouth at bedtime. Shade Flood, MD Taking Active   Blood Glucose Monitoring Suppl DEVI [id number] No 1 each by Does not apply route in the morning, at noon, and at bedtime. May substitute to any manufacturer covered by patient's insurance.  Patient not taking: Reported on 11/09/2023   Shade Flood, MD Not Taking Active   cetirizine (ZYRTEC) 10 MG tablet [id number]  Take 1 tablet (10 mg total) by mouth daily.  Patient taking differently: Take 10 mg by mouth daily as needed for  allergies.   Shade Flood, MD  Active   Continuous Glucose Sensor (DEXCOM G6 SENSOR) MISC [id number] No Use to check blood glucose continuously. Change sensor every 10 days.  Patient not taking: Reported on 11/09/2023   Shade Flood, MD Not Taking Active   Continuous Glucose Transmitter [facility] G6 TRANSMITTER) MISC [id number] No Use to check blood glucose continuously. Change transmitter every 90 days  Patient not taking: Reported on 11/09/2023   Shade Flood, MD Not Taking Active   diphenhydrAMINE (BENADRYL ALLERGY) 25 MG tablet [id number]  Take 1 tablet (25 mg total) by mouth every 6 (six) hours as needed for itching.   Active   Dulaglutide (TRULICITY) 1.5 MG/0.5ML SOAJ [id number] No Inject 1.5 mg into the skin every 7 (seven) days.  Patient not taking: Reported on 11/09/2023   Shade Flood, MD Not Taking Active   ezetimibe (ZETIA)  10 MG tablet [id number] Yes Take 1 tablet (10 mg total) by mouth daily. Shade Flood, MD Taking Active   glipiZIDE (GLUCOTROL) 10 MG tablet [id number] Yes Take 1 tablet (10 mg total) by mouth 2 (two) times daily before a meal. Shade Flood, MD Taking Active   insulin glargine (LANTUS) 100 UNIT/ML Solostar Pen [id number] Yes Inject 12 Units into the skin daily.  Patient taking differently: Inject 16 Units into the skin daily.   Shade Flood, MD Taking Active   Insulin Pen Needle (UNIFINE PENTIPS) 32G X 4 MM MISC [id number] Yes Use daily as directed Shade Flood, MD Taking Active   losartan (COZAAR) 100 MG tablet [id number]  Take 1 tablet (100 mg total) by mouth daily. Shade Flood, MD  Active   losartan (COZAAR) 100 MG tablet [id number] Yes Take 1 tablet (100 mg total) by mouth daily. Shade Flood, MD Taking Active   metFORMIN (GLUCOPHAGE) 850 MG tablet [id number] Yes Take 1 tablet (850 mg total) by mouth 2 (two) times daily with a meal. Shade Flood, MD Taking Active   omeprazole (PRILOSEC) 20 MG capsule [id number] Yes Take  1 capsule (20 mg total) by mouth daily. Shade Flood, MD Taking Active   potassium chloride (KLOR-CON) 10 MEQ tablet [id number] Yes Take 2 tablets (20 mEq total) by mouth daily.  Patient taking differently: Take 30 mEq by mouth daily.   Shade Flood, MD Taking Active            Med Note [facility] [place] [facility]   Thu Nov 09, 2023  2:40 PM) Dose increased per lab notes 11/06/23  triamcinolone cream (KENALOG) 0.1 % [id number]  Apply 1 Application topically 2 (two) times daily. Shade Flood, MD  Active              Assessment/Plan:  Medication Management:  - reviewed each medication with patient. He requests refill on aspirin 81mg .   Diabetes:Currently uncontrolled  - Patient to come into office for visit with me 11/28/2023. He would like to be in the office when he places his sensor this time.  - Recommended increase Lantus to 18 units daily. - Recommended that he take the Trulicity 0.75mg  dose he has on hand. Since he has not had Trulicity in about 3 months should probably restart at lower dose but will check with Dr Chilton Si. (He would be able to get 0.75mg  dose now, but he could also get 1.5mg  since last RF was 11/14 for 28 DS).    Hypertension: - Currently uncontrolled - Reviewed long term cardiovascular and renal outcomes of uncontrolled blood pressure - Recommend to continue losartan 100mg  daily and amlodipine 2.5mg  daily   Hyperlipidemia/ASCVD Risk Reduction: - Currently LDL is at goal of < 70; Tg at goal of < 150 - Continue to take atorvastatin 40mg  daily     Follow Up Plan: in person visit 11/28/2023  Henrene Pastor, PharmD Clinical Pharmacist [facility]  Population Health [phone]

## 2023-11-28 ENCOUNTER — Other Ambulatory Visit: Payer: Self-pay

## 2023-11-28 ENCOUNTER — Other Ambulatory Visit: Payer: Self-pay | Admitting: Pharmacist

## 2023-11-28 MED ORDER — TRULICITY 0.75 MG/0.5ML ~~LOC~~ SOAJ
0.7500 mg | SUBCUTANEOUS | 0 refills | Status: AC
  Filled 2023-11-28: qty 2, 28d supply, fill #0

## 2023-11-28 NOTE — Progress Notes (Signed)
11/28/2023 Name: Dylan Abbott MRN: [id number] DOB: Nov 24, 1966  Chief Complaint  Patient presents with   Diabetes    Dylan Abbott is a 57 y.o. year old male who presented for an in- person visit. Patient was seen by Clinical Pharmacist at [facility] today.   They were referred to the pharmacist by their PCP for assistance in managing diabetes, medication access, and complex medication management.    Subjective:  Medication Access/Adherence  Patient stated Continuous Glucose Monitor - Libre Continuous Glucose Monitor - 3 weeks ago but he did not feel comfortable replacing sensor on his own and requested appointment today with clinical pharmacist to guide him thru starting new Libre 3 sensor.   Patient is using Freestyle Libre 3 Plus sensors. He had purchased 6 sensors from [employer] but these are not preferred and he paid $200 for them. He has 4 sensors left.   His insurance plan prefers DexCom G6. He has had 1 month of DexCom filled at [facility]. For the rest of 2024 he pays nothing for diabetic supplies but he might have a $2000 deductible that restarts in January. Planning to use the [place] sensors for the next 2 months and then transition over to the Dex Com G6.   Patient has met his yearly $2000 deductible in 2024 so all meds on his plans formulary should be $0 thru 12/19/2023 .   Current Pharmacy:  [id number] [place], [place] - [address] AT [facility] [address] [place] [address] Phone: [phone] Fax: [phone]  [place] [facility] [address] [place] [address] Phone: [phone] Fax: [phone]   Patient reports affordability concerns with their medications: Yes  - only when he has not met deductible. Currently meds are $0 because he met his deductible this year.  Patient reports access/transportation concerns to their pharmacy: No  Patient reports  adherence concerns with their medications:  No   - but refill history does show some inconsistencies in regular refills   Diabetes:  Current medications:  Lantus 16 units daily (takes around 6pm when he leaves for work) ; metformin 850mg  twice a day and glipizide 10mg  daily.   Trulicity 1.5mg  weekly - has not restarted yet because patient does not have medication on hand.  Last week he found a Trulicity 0.75mg  dose in his refrigerator that was in date. He took for 1 dose but does not have any more.  After sending Dr Neva Seat a message about how long patient has been without Trulicity it was decided to restart at lower dose 0.75mg  for 4 weeks and then increase to 1.5mg  weekly .    CGM Documentation - this report is from 11/05/2023 to 11/18/2023 Days Worn: 14 days - last sensor ended 11/18/2023  - patient has not placed new sensor because he was worried he will do incorrectly.  % Time CGM is active: 98% (goal >=70%) Average Glucose:  261 mg/dL (previously was 563 mg/dL) Glucose Management Indicator: 9.6% (previously was 9.4%) Glucose Variability: 32.1% (previously was 32.1%) (goal <36%) Time in Range:  - Time above range >250: 52% (previously was 49%) (typical goal: <5%) - Time above range 181-250: 26% (previously was 27%) (typical goal <20%) - Time in range 70-180: 22% (previously was 24%) (typical goal >=70%) - Time below range 54-69: 0% (typical goal <4%) - Time below range < 54: 0% (typical goal <1%)  Objective:  Lab Results  Component Value Date   HGBA1C 11.3 (H) 11/06/2023    Lab Results  Component Value Date   CREATININE 1.35 11/06/2023   BUN 22 11/06/2023   NA 133 (L) 11/06/2023   K 3.3 (L) 11/06/2023   CL 94 (L) 11/06/2023   CO2 29 11/06/2023    Lab Results  Component Value Date   CHOL 137 09/13/2023   HDL 57.60 09/13/2023   LDLCALC 65 09/13/2023   TRIG 74.0 09/13/2023   CHOLHDL 2 09/13/2023    Medications Reviewed Today     Reviewed by Henrene Pastor, RPH-CPP (Pharmacist) on 11/28/23 at 1517  Med List Status: <None>   Medication Order Taking? Sig Documenting Provider Last Dose Status Informant  Accu-Chek FastClix Lancets MISC [id number] No USE TO CHECK BLOOD SUGAR EVERY DAY  Patient not taking: Reported on 11/28/2023   Shade Flood, MD Not Taking Active Self  amLODipine (NORVASC) 2.5 MG tablet [id number] Yes Take 1 tablet (2.5 mg total) by mouth daily. Shade Flood, MD Taking Active   aspirin EC 81 MG tablet [id number] Yes Take 1 tablet (81 mg total) by mouth daily for 30 days, then as directed by provider. Swallow whole. Burnadette Pop, MD Taking Active   atorvastatin (LIPITOR) 40 MG tablet [id number] Yes Take 1 tablet (40 mg total) by mouth at bedtime. Shade Flood, MD Taking Active   Blood Glucose Monitoring Suppl DEVI [id number] No 1 each by Does not apply route in the morning, at noon, and at bedtime. May substitute to any manufacturer covered by patient's insurance.  Patient not taking: Reported on 11/28/2023   Shade Flood, MD Not Taking Active   cetirizine (ZYRTEC) 10 MG tablet [id number] Yes Take 1 tablet (10 mg total) by mouth daily.  Patient taking differently: Take 10 mg by mouth daily as needed for allergies.   Shade Flood, MD Taking Active   Continuous Glucose Sensor (DEXCOM G6 SENSOR) MISC [id number] No Use to check blood glucose continuously. Change sensor every 10 days.  Patient not taking: Reported on 11/28/2023   Shade Flood, MD Not Taking Active   Continuous Glucose Transmitter [facility] G6 TRANSMITTER) MISC [id number] No Use to check blood glucose continuously. Change transmitter every 90 days  Patient not taking: Reported on 11/28/2023   Shade Flood, MD Not Taking Active   diphenhydrAMINE (BENADRYL ALLERGY) 25 MG tablet [id number]  Take 1 tablet (25 mg total) by mouth every 6 (six) hours as needed for itching.   Active   Dulaglutide (TRULICITY) 1.5 MG/0.5ML SOAJ [id number] No Inject 1.5  mg into the skin every 7 (seven) days.  Patient not taking: Reported on 11/28/2023   Shade Flood, MD Not Taking Active   ezetimibe (ZETIA) 10 MG tablet [id number] Yes Take 1 tablet (10 mg total) by mouth daily. Shade Flood, MD Taking Active   glipiZIDE (GLUCOTROL) 10 MG tablet [id number] Yes Take 1 tablet (10 mg total) by mouth 2 (two) times daily before a meal. Shade Flood, MD Taking Active   insulin glargine (LANTUS) 100 UNIT/ML Solostar Pen [id number] Yes Inject 12 Units into the skin daily.  Patient taking differently: Inject 16 Units into the skin daily.   Shade Flood, MD Taking Active   Insulin Pen Needle (UNIFINE PENTIPS) 32G X 4 MM MISC [id number] Yes Use daily as directed Shade Flood, MD Taking Active   losartan (COZAAR) 100 MG tablet [id number]  Take 1 tablet (100 mg total)  by mouth daily. Shade Flood, MD  Active   losartan (COZAAR) 100 MG tablet [id number] Yes Take 1 tablet (100 mg total) by mouth daily. Shade Flood, MD Taking Active   metFORMIN (GLUCOPHAGE) 850 MG tablet [id number] Yes Take 1 tablet (850 mg total) by mouth 2 (two) times daily with a meal. Shade Flood, MD Taking Active   omeprazole (PRILOSEC) 20 MG capsule [id number]  Take 1 capsule (20 mg total) by mouth daily. Shade Flood, MD  Active   potassium chloride (KLOR-CON) 10 MEQ tablet [id number] Yes Take 2 tablets (20 mEq total) by mouth daily.  Patient taking differently: Take 30 mEq by mouth daily.   Shade Flood, MD Taking Active            Med Note [facility] [place] [facility]   Thu Nov 09, 2023  2:40 PM) Dose increased per lab notes 11/06/23  triamcinolone cream (KENALOG) 0.1 % [id number]  Apply 1 Application topically 2 (two) times daily. Shade Flood, MD  Active              Assessment/Plan:  Medication Management:  - reviewed each medication with patient. Patient requested refill for aspirin 81mg  daily   Diabetes:Currently uncontrolled  - Guided patient with new  sensor placement and start.   - Continue Lantus to 18 units daily. - Continue Trulicity 0.75mg  weekly for 4 weeks, then increase to 1.5mg  thereafter.    Follow Up Plan: has follow up with PCP later this week. Will check in with patient to review Continuous Glucose Monitor in 2 weeks.   Henrene Pastor, PharmD Clinical Pharmacist [facility]  Population Health [phone]

## 2023-11-28 NOTE — Addendum Note (Signed)
Addended by: Henrene Pastor B on: 11/28/2023 03:31 PM   Modules accepted: Orders

## 2023-11-28 NOTE — Progress Notes (Signed)
11/28/2023 - Addendum -  Sent in Rx for Trulicity 0.75mg  - inject SQ weekly for 4 weeks, then restart 1.5mg  weekly.

## 2023-12-04 ENCOUNTER — Ambulatory Visit (INDEPENDENT_AMBULATORY_CARE_PROVIDER_SITE_OTHER): Payer: 59

## 2023-12-04 DIAGNOSIS — I639 Cerebral infarction, unspecified: Secondary | ICD-10-CM | POA: Diagnosis not present

## 2023-12-04 LAB — CUP PACEART REMOTE DEVICE CHECK
Date Time Interrogation Session: 20241215231849
Implantable Pulse Generator Implant Date: 20240730

## 2023-12-07 ENCOUNTER — Ambulatory Visit: Payer: 59 | Admitting: Family Medicine

## 2023-12-07 ENCOUNTER — Encounter: Payer: Self-pay | Admitting: Family Medicine

## 2023-12-07 VITALS — BP 144/88 | HR 75 | Temp 98.4°F | Ht 72.0 in | Wt 230.8 lb

## 2023-12-07 DIAGNOSIS — E1165 Type 2 diabetes mellitus with hyperglycemia: Secondary | ICD-10-CM | POA: Diagnosis not present

## 2023-12-07 DIAGNOSIS — I10 Essential (primary) hypertension: Secondary | ICD-10-CM | POA: Diagnosis not present

## 2023-12-07 MED ORDER — ASPIRIN 81 MG PO TBEC: 81.0000 mg | DELAYED_RELEASE_TABLET | Freq: Every day | ORAL | 3 refills | Status: DC

## 2023-12-07 NOTE — Progress Notes (Signed)
Subjective:  Patient ID: Dylan Abbott, male    DOB: [birth year]  Age: 57 y.o. MRN: [id number]  CC:  Chief Complaint  Patient presents with   Medical Management of Chronic Issues    Pt doing well, notes he does still have headache since the last time he came in the office     HPI Dylan Abbott presents for   Diabetes: See previous visits, complicated by hyperglycemia, CVD with prior suspected TIA, medication adherence previously.  Referred to endocrinology in November.  No appointment scheduled yet. Has also been under the care of clinical pharmacist.  Last visit December 10. He is on statin with Lipitor 40 mg daily, Zetia 10 mg daily ARB with losartan 100 mg daily Asa81mg  every day  - needs RF. No new bleeding.  Had been started on freestyle libre 3+ sensors, appointment December 10 to help with use of sensors.  Insurance preferred [employer].  Planned to continue using libre sensors for the next 2 months and then transition over to Dexcom G6.  Had met his deductible in 2024.  Current meds should be $0 because he has met deductible the remainder of this year. As of December 10 visit he was taking Lantus 16 units daily, metformin 850 mg twice daily and glipizide 10 mg daily. Trulicity had been ordered, had not yet restarted because he had not had medication on hand.  He did take one of the 0.75 mg doses a week prior.  Because he had been off his 1.5 mg for a while we restarted 0.75 mg for 4 weeks and plan for increase to 1.5 mg weekly. CGM documentation report noted from 11/17 through 11/30 with average glucose 261.  No lows, and time in range of 70-180 was only 22%.  26% between 181-250 and 52% above 250. On Lantus 18 units daily.  On Trulicity 0.75 mg weekly - started back 4 days ago.  No side effects - no n/v/abd pain.   Current glucose reading 138.  Glucose down to 68 last week once at work. Had not eaten a meal recently. No other episodes.    No new side effects of meds.     Lab Results  Component Value Date   HGBA1C 11.3 (H) 11/06/2023   HGBA1C 12.2 (H) 07/18/2023   HGBA1C 12.1 (H) 07/18/2023   Lab Results  Component Value Date   MICROALBUR 4.0 (H) 11/06/2023   LDLCALC 65 09/13/2023   CREATININE 1.35 11/06/2023    History Patient Active Problem List   Diagnosis Date Noted   NSTEMI (non-ST elevated myocardial infarction) (HCC) 07/16/2023   Impingement syndrome of left shoulder 07/30/2020   COVID-19 virus infection 10/31/2019   Abnormal liver function 10/31/2019   Hypokalemia 10/31/2019   Hyponatremia 10/31/2019   Diabetes mellitus (HCC) 11/13/2017   Hyperlipidemia 11/13/2017   Hypertensive disorder 11/13/2017   Past Medical History:  Diagnosis Date   Diabetes mellitus without complication (HCC)    Hyperlipidemia    Hypertension    Past Surgical History:  Procedure Laterality Date   LOOP RECORDER INSERTION N/A 07/18/2023   Procedure: LOOP RECORDER INSERTION;  Surgeon: Graciella Freer, PA-C;  Location: [facility];  Service: Cardiovascular;  Laterality: N/A;   No Known Allergies Prior to Admission medications   Medication Sig Start Date End Date Taking? Authorizing Provider  amLODipine (NORVASC) 2.5 MG tablet Take 1 tablet (2.5 mg total) by mouth daily. 11/06/23  Yes Shade Flood, MD  aspirin EC 81 MG tablet Take  1 tablet (81 mg total) by mouth daily for 30 days, then as directed by provider. Swallow whole. 07/19/23  Yes Burnadette Pop, MD  atorvastatin (LIPITOR) 40 MG tablet Take 1 tablet (40 mg total) by mouth at bedtime. 08/03/23  Yes Shade Flood, MD  cetirizine (ZYRTEC) 10 MG tablet Take 1 tablet (10 mg total) by mouth daily. Patient taking differently: Take 10 mg by mouth daily as needed for allergies. 08/09/23  Yes Shade Flood, MD  diphenhydrAMINE (BENADRYL ALLERGY) 25 MG tablet Take 1 tablet (25 mg total) by mouth every 6 (six) hours as needed for itching. 08/07/23  Yes   Dulaglutide (TRULICITY) 0.75  MG/0.5ML SOAJ Inject 0.75 mg into the skin once a week for 28 days. 11/28/23 12/27/23 Yes Shade Flood, MD  ezetimibe (ZETIA) 10 MG tablet Take 1 tablet (10 mg total) by mouth daily. 09/13/23  Yes Shade Flood, MD  glipiZIDE (GLUCOTROL) 10 MG tablet Take 1 tablet (10 mg total) by mouth 2 (two) times daily before a meal. 08/03/23  Yes Shade Flood, MD  insulin glargine (LANTUS) 100 UNIT/ML Solostar Pen Inject 12 Units into the skin daily. Patient taking differently: Inject 16 Units into the skin daily. 09/13/23  Yes Shade Flood, MD  Insulin Pen Needle (UNIFINE PENTIPS) 32G X 4 MM MISC Use daily as directed 08/03/23  Yes Shade Flood, MD  losartan (COZAAR) 100 MG tablet Take 1 tablet (100 mg total) by mouth daily. 08/03/23  Yes Shade Flood, MD  losartan (COZAAR) 100 MG tablet Take 1 tablet (100 mg total) by mouth daily. 08/03/23  Yes Shade Flood, MD  metFORMIN (GLUCOPHAGE) 850 MG tablet Take 1 tablet (850 mg total) by mouth 2 (two) times daily with a meal. 11/06/23  Yes Shade Flood, MD  omeprazole (PRILOSEC) 20 MG capsule Take 1 capsule (20 mg total) by mouth daily. 10/20/23  Yes Shade Flood, MD  potassium chloride (KLOR-CON) 10 MEQ tablet Take 2 tablets (20 mEq total) by mouth daily. Patient taking differently: Take 30 mEq by mouth daily. 07/10/23  Yes Shade Flood, MD  triamcinolone cream (KENALOG) 0.1 % Apply 1 Application topically 2 (two) times daily. 09/27/23  Yes Shade Flood, MD  Accu-Chek FastClix Lancets MISC USE TO CHECK BLOOD SUGAR EVERY DAY Patient not taking: Reported on 11/28/2023 09/08/21   Shade Flood, MD  Blood Glucose Monitoring Suppl DEVI 1 each by Does not apply route in the morning, at noon, and at bedtime. May substitute to any manufacturer covered by patient's insurance. Patient not taking: Reported on 12/07/2023 07/27/23   Shade Flood, MD  Continuous Glucose Sensor (DEXCOM G6 SENSOR) MISC Use to check blood glucose  continuously. Change sensor every 10 days. Patient not taking: Reported on 12/07/2023 10/24/23   Shade Flood, MD  Continuous Glucose Transmitter (DEXCOM G6 TRANSMITTER) MISC Use to check blood glucose continuously. Change transmitter every 90 days Patient not taking: Reported on 12/07/2023 10/24/23   Shade Flood, MD  Dulaglutide (TRULICITY) 1.5 MG/0.5ML SOAJ Inject 1.5 mg into the skin every 7 (seven) days. Patient not taking: Reported on 12/07/2023 12/16/22   Shade Flood, MD   Social History   Socioeconomic History   Marital status: Married    Spouse name: Not on file   Number of children: 2   Years of education: Not on file   Highest education level: Not on file  Occupational History   Occupation: environmental  Tobacco  Use   Smoking status: Never   Smokeless tobacco: Never  Substance and Sexual Activity   Alcohol use: Never    Alcohol/week: 0.0 standard drinks of alcohol   Drug use: Never   Sexual activity: Yes  Other Topics Concern   Not on file  Social History Narrative   Not on file   Social Drivers of Health   Financial Resource Strain: Not on file  Food Insecurity: No Food Insecurity (07/17/2023)   Hunger Vital Sign    Worried About Running Out of Food in the Last Year: Never true    Ran Out of Food in the Last Year: Never true  Transportation Needs: No Transportation Needs (07/17/2023)   PRAPARE - Administrator, Civil Service (Medical): No    Lack of Transportation (Non-Medical): No  Physical Activity: Not on file  Stress: Not on file  Social Connections: Not on file  Intimate Partner Violence: Not At Risk (07/17/2023)   Humiliation, Afraid, Rape, and Kick questionnaire    Fear of Current or Ex-Partner: No    Emotionally Abused: No    Physically Abused: No    Sexually Abused: No    Review of Systems  Per HPI.  Objective:   Vitals:   12/07/23 1322 12/07/23 1326  BP: (!) 158/90 (!) 144/88  Pulse: 75   Temp: 98.4 F (36.9 C)    TempSrc: Temporal   SpO2: 98%   Weight: 230 lb 12.8 oz (104.7 kg)   Height: 6' (1.829 m)      Physical Exam Vitals reviewed.  Constitutional:      Appearance: He is well-developed.  HENT:     Head: Normocephalic and atraumatic.  Neck:     Vascular: No carotid bruit or JVD.  Cardiovascular:     Rate and Rhythm: Normal rate and regular rhythm.     Heart sounds: Normal heart sounds. No murmur heard. Pulmonary:     Effort: Pulmonary effort is normal.     Breath sounds: Normal breath sounds. No rales.  Musculoskeletal:     Right lower leg: No edema.     Left lower leg: No edema.  Skin:    General: Skin is warm and dry.  Neurological:     Mental Status: He is alert and oriented to person, place, and time.  Psychiatric:        Mood and Affect: Mood normal.        Assessment & Plan:  Dylan Abbott is a 57 y.o. male . Essential hypertension - Plan: aspirin EC 81 MG tablet  Type 2 diabetes mellitus with hyperglycemia, without long-term current use of insulin (HCC) - Plan: aspirin EC 81 MG tablet Elevated blood pressure in office.  Some concern of medication adherence previously, advised to bring all his medicines to upcoming pharmacy appointment as well as outside blood pressure readings prior to that visit if possible to decide on changes.  Additional history at end of visit, patient had just taken his blood pressure medicine prior to visit today.  Blood sugar improving in office today.  No changes with current regimen, keep follow-up with pharmacist next week.  Single episode of low blood sugar as above when he had not eaten, regular meals discussed, handout given on preventing hypoglycemia.  RTC precautions given.  Plan on repeat visit with me in 2 months for labs, keep follow-up with pharmacist as planned.  Appreciate their assistance.  Meds ordered this encounter  Medications   aspirin EC 81 MG tablet  Sig: Take 1 tablet (81 mg total) by mouth daily.    Dispense:   90 tablet    Refill:  3   Patient Instructions  Blood pressure is slightly high here today.  Please bring your medicines with you to the pharmacy visit next week to make sure the medicines we have you taking is consistent with what you are taking at home.  Try to check your blood pressure outside of the office and bring some records to the pharmacy appointment and she can send me those notes to decide if we need to make some changes.  Make sure not to skip any meals, see other information below on preventing low blood sugars.  If you eat regular meals and have any return of low blood sugars let me know right away as we will need to adjust your medications.  Continue Trulicity same dose for now as well as Lantus same dose for now and keep follow-up with pharmacist next week.  Follow-up appointment with me in 2 months and we can recheck labs at that time  Return to the clinic or go to the nearest emergency room if any of your symptoms worsen or new symptoms occur.   Preventing Hypoglycemia Hypoglycemia is when the amount of sugar, or glucose, in your blood is too low. Low blood sugar can happen if you have diabetes or if you don't have diabetes. It may be an emergency. Work with your health care provider to make and change your meal plan as needed. This can help prevent low blood sugar. What can increase my risk? You may be more likely to get low blood sugar if: You take insulin or other diabetes medicines. You skip or delay a meal or snack. You get sick. How can low blood sugar affect me? Mild symptoms Mild cases may not cause symptoms. If you do have symptoms, they may include: Hunger or feeling like you may vomit. Sweating and feeling cold to the touch. Feeling dizzy or light-headed. Being sleepy or having trouble sleeping. A fast heart rate. A headache. Blurry eyesight. Mood changes. These include feeling worried, nervous, or easily annoyed. Tingling or numbness around your mouth,  lips, or tongue. If a mild case of low blood sugar isn't treated, it can become moderate or severe. Moderate symptoms If you have a moderate case, you may: Feel confused. Have changes in the way you act or move. Feel weak. Have an uneven heartbeat. Severe symptoms Having very low blood sugar is an emergency. It can cause: Fainting. Seizures. A coma. Death. What nutrition changes can I make? Work with your provider or an expert in healthy eating called a dietitian to make a meal plan. Eat meals at set times. Have snacks between meals, as told by your provider. Donot skip or delay meals or snacks. What other actions can I take to prevent low blood sugar?  Work closely with your provider to manage your blood sugar. Make sure you know: What your blood sugar should be. How and when to check your blood sugar. The symptoms of low blood sugar. Be sure to eat food when you drink alcohol. When you're sick, check your blood sugar more often. Make a sick day plan in advance with your provider. Follow this plan when you can't eat or drink like normal. Always check your blood sugar before, during, and after exercise. How is this treated? Treating low blood sugar If you have low blood sugar, eat or drink something with sugar in it right  away. The food or drink should have 15 grams of a fast-acting carbohydrate (carb). Options include: 4 oz (120 mL) of fruit juice. 4 oz (120 mL) of soda (not diet soda). A few pieces of hard candy. Check food labels to see how many pieces to eat. 1 Tbsp (15 mL) of sugar or honey. 4 glucose tablets. 1 tube of glucose gel. Treating low blood sugar if you have diabetes If you're alert and can swallow safely, follow the 15:15 rule: Take 15 grams of a fast-acting carb. Talk with your provider about how much carb you should take. Check your blood sugar 15 minutes after you take the carb. If your blood sugar is still at or below 70 mg/dL (3.9 mmol/L), take 15 grams  of a carb again. If your blood sugar doesn't go above 70 mg/dL (3.9 mmol/L) after 3 tries, get help right away. After your blood sugar goes back to normal, eat a meal or a snack within 1 hour. Treating very low blood sugar If your blood sugar is less than 54 mg/dL (3 mmol/L), it's an emergency. Get help right away. If you can't eat or drink, you will need to be given glucagon. A family member or friend should learn how to check your blood sugar and give you glucagon. Ask your provider if you should keep a glucagon kit at home. You may also need to be treated in a hospital. Where to find more information American Diabetes Association (ADA): diabetes.[employer] of Diabetes and Digestive and Kidney Diseases (NIDDK): [employer]Sync.si Association of Diabetes Care & Education Specialists: diabeteseducator.org Contact a health care provider if: You have diabetes and are having trouble keeping your blood sugar in the right range. You have low blood sugar often. Get help right away if: You can't get your blood sugar above 70 mg/dL (3.9 mmol/L) after 3 tries. Your blood sugar is below 54 mg/dL (3 mmol/L). You faint. You have a seizure. These symptoms may be an emergency. Call 911 right away. Do not wait to see if the symptoms will go away. Do not drive yourself to the hospital. This information is not intended to replace advice given to you by your health care provider. Make sure you discuss any questions you have with your health care provider. Document Revised: 02/23/2023 Document Reviewed: 02/23/2023 Elsevier Patient Education  2024 Elsevier Inc.     Signed,   Meredith Staggers, MD [facility] 12/07/23 2:04 PM

## 2023-12-07 NOTE — Patient Instructions (Signed)
Blood pressure is slightly high here today.  Please bring your medicines with you to the pharmacy visit next week to make sure the medicines we have you taking is consistent with what you are taking at home.  Try to check your blood pressure outside of the office and bring some records to the pharmacy appointment and she can send me those notes to decide if we need to make some changes.  Make sure not to skip any meals, see other information below on preventing low blood sugars.  If you eat regular meals and have any return of low blood sugars let me know right away as we will need to adjust your medications.  Continue Trulicity same dose for now as well as Lantus same dose for now and keep follow-up with pharmacist next week.  Follow-up appointment with me in 2 months and we can recheck labs at that time  Return to the clinic or go to the nearest emergency room if any of your symptoms worsen or new symptoms occur.   Preventing Hypoglycemia Hypoglycemia is when the amount of sugar, or glucose, in your blood is too low. Low blood sugar can happen if you have diabetes or if you don't have diabetes. It may be an emergency. Work with your health care provider to make and change your meal plan as needed. This can help prevent low blood sugar. What can increase my risk? You may be more likely to get low blood sugar if: You take insulin or other diabetes medicines. You skip or delay a meal or snack. You get sick. How can low blood sugar affect me? Mild symptoms Mild cases may not cause symptoms. If you do have symptoms, they may include: Hunger or feeling like you may vomit. Sweating and feeling cold to the touch. Feeling dizzy or light-headed. Being sleepy or having trouble sleeping. A fast heart rate. A headache. Blurry eyesight. Mood changes. These include feeling worried, nervous, or easily annoyed. Tingling or numbness around your mouth, lips, or tongue. If a mild case of low blood sugar  isn't treated, it can become moderate or severe. Moderate symptoms If you have a moderate case, you may: Feel confused. Have changes in the way you act or move. Feel weak. Have an uneven heartbeat. Severe symptoms Having very low blood sugar is an emergency. It can cause: Fainting. Seizures. A coma. Death. What nutrition changes can I make? Work with your provider or an expert in healthy eating called a dietitian to make a meal plan. Eat meals at set times. Have snacks between meals, as told by your provider. Donot skip or delay meals or snacks. What other actions can I take to prevent low blood sugar?  Work closely with your provider to manage your blood sugar. Make sure you know: What your blood sugar should be. How and when to check your blood sugar. The symptoms of low blood sugar. Be sure to eat food when you drink alcohol. When you're sick, check your blood sugar more often. Make a sick day plan in advance with your provider. Follow this plan when you can't eat or drink like normal. Always check your blood sugar before, during, and after exercise. How is this treated? Treating low blood sugar If you have low blood sugar, eat or drink something with sugar in it right away. The food or drink should have 15 grams of a fast-acting carbohydrate (carb). Options include: 4 oz (120 mL) of fruit juice. 4 oz (120 mL) of soda (  not diet soda). A few pieces of hard candy. Check food labels to see how many pieces to eat. 1 Tbsp (15 mL) of sugar or honey. 4 glucose tablets. 1 tube of glucose gel. Treating low blood sugar if you have diabetes If you're alert and can swallow safely, follow the 15:15 rule: Take 15 grams of a fast-acting carb. Talk with your provider about how much carb you should take. Check your blood sugar 15 minutes after you take the carb. If your blood sugar is still at or below 70 mg/dL (3.9 mmol/L), take 15 grams of a carb again. If your blood sugar doesn't go  above 70 mg/dL (3.9 mmol/L) after 3 tries, get help right away. After your blood sugar goes back to normal, eat a meal or a snack within 1 hour. Treating very low blood sugar If your blood sugar is less than 54 mg/dL (3 mmol/L), it's an emergency. Get help right away. If you can't eat or drink, you will need to be given glucagon. A family member or friend should learn how to check your blood sugar and give you glucagon. Ask your provider if you should keep a glucagon kit at home. You may also need to be treated in a hospital. Where to find more information American Diabetes Association (ADA): diabetes.[employer] of Diabetes and Digestive and Kidney Diseases (NIDDK): StageSync.si Association of Diabetes Care & Education Specialists: diabeteseducator.org Contact a health care provider if: You have diabetes and are having trouble keeping your blood sugar in the right range. You have low blood sugar often. Get help right away if: You can't get your blood sugar above 70 mg/dL (3.9 mmol/L) after 3 tries. Your blood sugar is below 54 mg/dL (3 mmol/L). You faint. You have a seizure. These symptoms may be an emergency. Call 911 right away. Do not wait to see if the symptoms will go away. Do not drive yourself to the hospital. This information is not intended to replace advice given to you by your health care provider. Make sure you discuss any questions you have with your health care provider. Document Revised: 02/23/2023 Document Reviewed: 02/23/2023 Elsevier Patient Education  2024 [employer].

## 2023-12-12 ENCOUNTER — Other Ambulatory Visit: Payer: Self-pay | Admitting: Pharmacist

## 2023-12-12 ENCOUNTER — Other Ambulatory Visit (HOSPITAL_COMMUNITY): Payer: Self-pay

## 2023-12-12 ENCOUNTER — Other Ambulatory Visit: Payer: Self-pay

## 2023-12-12 ENCOUNTER — Encounter: Payer: Self-pay | Admitting: Pharmacist

## 2023-12-12 NOTE — Progress Notes (Signed)
12/12/2023 Name: Dylan Abbott MRN: [id number] DOB: 1966/05/08  Chief Complaint  Patient presents with   Diabetes    Dylan Abbott is a 57 y.o. year old male who presented for a phone visit with the  Clinical Pharmacist today.   They were referred to the pharmacist by their PCP for assistance in managing diabetes, medication access, and complex medication management.    Subjective:  Medication Access/Adherence  Patient stated Continuous Glucose Monitor - Libre Continuous Glucose Monitor - about 5 weeks ago He has been doing well and blood glucose has improved significantly.   Patient is using Freestyle Libre 3 Plus sensors. He had purchased 6 sensors from [employer] but these are not preferred and he paid $200 for them. He has 3 sensors left.   His insurance plan prefers DexCom G6. He has had 1 month of DexCom filled at [facility]. For the rest of 2024 he pays nothing for diabetic supplies but he might have a $2000 deductible that restarts in January. Planning to use the [place] sensors for the next 2 months and then transition over to the Dex Com G6.   Patient has met his yearly $2000 deductible in 2024 so all meds on his plans formulary should be $0 thru 12/19/2023 .   Current Pharmacy:  [id number] [place], [place] - [address] AT [facility] OF [facility] [address] [place] [address] Phone: [phone] Fax: [phone]  [place] [facility] [address] [place] [address] Phone: [phone] Fax: [phone]   Patient reports affordability concerns with their medications: Yes  - only when he has not met deductible. Currently meds are $0 because he met his deductible this year.  Patient reports access/transportation concerns to their pharmacy: No  Patient reports adherence concerns with their medications:  No   - but refill history does show some inconsistencies in regular  refills   Diabetes:  Current medications:  Lantus 18 units daily (takes around 6pm when he leaves for work); Trulicity 0.75mg  weekly (restarted about 2 or 3 weeks ago); metformin 850mg  twice a day and glipizide 10mg  daily.   Plan is to increase to Trulicity 1.5mg  weekly once he completes current box of Trulicity 0.75mg .    CGM Documentation - this report is from 11/29/2023 to 12/12/2023 Days Worn: 14 days  % Time CGM is active: 97% (goal >=70%) Average Glucose:  168 mg/dL (previously was 169 mg/dL) Glucose Management Indicator: 7.3% (previously was 9.6%) Glucose Variability: 33.8% (previously was 32.1%) (goal <36%) Time in Range:  - Time above range >250: 10% (previously was 52%) (typical goal: <5%) - Time above range 181-250: 25% (previously was 26%) (typical goal <20%) - Time in range 70-180: 65% (previously was 22%) (typical goal >=70%) - Time below range 54-69: 0% (typical goal <4%) - Time below range < 54: 0% (typical goal <1%)  Trends Noted - blood glucose much improved since restarted Trulicity.  Patient did have 2 lows in the 60's while at work over the last 2 weeks - one on 11/30/2023 and another on 12/08/2023. Blood glucose improved quickly - with spike after treated hypoglycemic event.          Previous report for comparison - 11/17 to 11/18/2023      Objective:  Lab Results  Component Value Date   HGBA1C 11.3 (H) 11/06/2023    Lab Results  Component Value Date   CREATININE 1.35 11/06/2023   BUN 22  11/06/2023   NA 133 (L) 11/06/2023   K 3.3 (L) 11/06/2023   CL 94 (L) 11/06/2023   CO2 29 11/06/2023    Lab Results  Component Value Date   CHOL 137 09/13/2023   HDL 57.60 09/13/2023   LDLCALC 65 09/13/2023   TRIG 74.0 09/13/2023   CHOLHDL 2 09/13/2023    Medications Reviewed Today     Reviewed by Henrene Pastor, RPH-CPP (Pharmacist) on 12/12/23 at 1213  Med List Status: <None>   Medication Order Taking? Sig Documenting Provider Last Dose  Status Informant  amLODipine (NORVASC) 2.5 MG tablet [id number] Yes Take 1 tablet (2.5 mg total) by mouth daily. Shade Flood, MD Taking Active   aspirin EC 81 MG tablet [id number] Yes Take 1 tablet (81 mg total) by mouth daily. Shade Flood, MD Taking Active   atorvastatin (LIPITOR) 40 MG tablet [id number] Yes Take 1 tablet (40 mg total) by mouth at bedtime. Shade Flood, MD Taking Active   cetirizine (ZYRTEC) 10 MG tablet [id number]  Take 1 tablet (10 mg total) by mouth daily.  Patient taking differently: Take 10 mg by mouth daily as needed for allergies.   Shade Flood, MD  Active   diphenhydrAMINE (BENADRYL ALLERGY) 25 MG tablet [id number]  Take 1 tablet (25 mg total) by mouth every 6 (six) hours as needed for itching.   Active   Dulaglutide (TRULICITY) 0.75 MG/0.5ML SOAJ [id number] Yes Inject 0.75 mg into the skin once a week for 28 days. Shade Flood, MD Taking Active            Med Note [facility] [place] [facility]   Tue Nov 28, 2023  3:33 PM) Restarting at lower dose for 4 weeks since he has not had Trulicity in > 1 month, then will restart 1.5mg  weekly  Dulaglutide (TRULICITY) 1.5 MG/0.5ML SOAJ [id number] No Inject 1.5 mg into the skin every 7 (seven) days.  Patient not taking: Reported on 11/28/2023   Shade Flood, MD Not Taking Active   ezetimibe (ZETIA) 10 MG tablet [id number] Yes Take 1 tablet (10 mg total) by mouth daily. Shade Flood, MD Taking Active   glipiZIDE (GLUCOTROL) 10 MG tablet [id number] Yes Take 1 tablet (10 mg total) by mouth 2 (two) times daily before a meal. Shade Flood, MD Taking Active   insulin glargine (LANTUS) 100 UNIT/ML Solostar Pen [id number] Yes Inject 12 Units into the skin daily.  Patient taking differently: Inject 16 Units into the skin daily.   Shade Flood, MD Taking Active   Insulin Pen Needle (UNIFINE PENTIPS) 32G X 4 MM MISC [id number] Yes Use daily as directed Shade Flood, MD Taking Active   losartan (COZAAR) 100 MG  tablet [id number] Yes Take 1 tablet (100 mg total) by mouth daily. Shade Flood, MD Taking Active   losartan (COZAAR) 100 MG tablet [id number] Yes Take 1 tablet (100 mg total) by mouth daily. Shade Flood, MD Taking Active   metFORMIN (GLUCOPHAGE) 850 MG tablet [id number] Yes Take 1 tablet (850 mg total) by mouth 2 (two) times daily with a meal. Shade Flood, MD Taking Active   omeprazole (PRILOSEC) 20 MG capsule [id number] Yes Take 1 capsule (20 mg total) by mouth daily. Shade Flood, MD Taking Active   potassium chloride (KLOR-CON) 10 MEQ tablet [id number]  Take 2 tablets (20 mEq total) by mouth daily.  Patient taking differently: Take 30 mEq by mouth daily.   Shade Flood, MD  Active  Med Note Clydie Braun, Shamel Galyean B   Thu Nov 09, 2023  2:40 PM) Dose increased per lab notes 11/06/23  triamcinolone cream (KENALOG) 0.1 % [id number] Yes Apply 1 Application topically 2 (two) times daily. Shade Flood, MD Taking Active              Assessment/Plan:  Medication Management:  - reviewed each medication with patient. Patient states [employer] is sending his aspirin in the mail  Diabetes:Currently uncontrolled  - Reviewed Continuous Glucose Monitor report with patient.  - Lower dose of Lantus to 16 units daily. - Continue Trulicity 0.75mg  weekly for 2 more doses. He will then increase to 1.5mg  weekly. We are hoping he will be able to fill 1 more time in 2024.  - Also requested pharmacy to see if atorvastatin and losartan can be filled 12/19/2023 - Discussed how to treat hypoglycemia - blood glucose < 80. Recommended 4 ounces of soda or juice or 3 to 4 glucose tablets or pieces of sugar / hard candy. If blood glucose not > 80 after 15 minutes he is not repeat. Follow with eating a small meal or snack.   Follow Up Plan: Will check in with patient to review Continuous Glucose Monitor in 2 to 3 weeks.   Henrene Pastor, PharmD Clinical Pharmacist [facility] Primary Care   Population Health [phone]

## 2023-12-28 ENCOUNTER — Other Ambulatory Visit: Payer: Self-pay | Admitting: Pharmacist

## 2024-01-03 ENCOUNTER — Ambulatory Visit: Payer: Self-pay | Admitting: Endocrinology

## 2024-01-07 NOTE — Progress Notes (Signed)
 01/07/2024 Name: Dylan Abbott MRN: [id number] DOB: 1966-11-12  No chief complaint on file.   Dylan Abbott is a 58 y.o. year old male who presented for a phone visit with the  Clinical Pharmacist today.   They were referred to the pharmacist by their PCP for assistance in managing diabetes, medication access, and complex medication management.    Subjective:  Medication Access/Adherence  Patient stated Continuous Glucose Monitor - Libre Continuous Glucose Monitor - about 5 weeks ago He has been doing well and blood glucose has improved significantly.   Patient is using Freestyle Libre 3 Plus sensors. He had purchased 6 sensors from [employer] but these are not preferred and he paid $200 for them. He has 3 sensors left.   His insurance plan prefers DexCom G6. He has had 1 month of DexCom filled at [facility]. For the rest of 2024 he pays nothing for diabetic supplies but he might have a $2000 deductible that restarts in January. Planning to use the [place] sensors for the next 2 months and then transition over to the Dex Com G6.   Patient has met his yearly $2000 deductible in 2024 so all meds on his plans formulary should be $0 thru 12/19/2023 .   Current Pharmacy:  [facility] #[id number] [place], [place] - [address] AT [facility] OF [facility] & [address] [place] [address] Phone: [phone] Fax: [phone]  [place] [facility] [address] [place] [address] Phone: [phone] Fax: [phone]   Patient reports affordability concerns with their medications: Yes  - only when he has not met deductible. Currently meds are $0 because he met his deductible this year.  Patient reports access/transportation concerns to their pharmacy: No  Patient reports adherence concerns with their medications:  No   - but refill history does show some inconsistencies in regular refills   Diabetes:  Current medications:   Lantus 18 units daily (takes around 6pm when he leaves for work); Trulicity 0.75mg  weekly (restarted about 2 or 3 weeks ago); metformin 850mg  twice a day and glipizide 10mg  daily.   Plan is to increase to Trulicity 1.5mg  weekly once he completes current box of Trulicity 0.75mg .    CGM Documentation - this report is from 11/29/2023 to 12/12/2023 Days Worn: 14 days  % Time CGM is active: 97% (goal >=70%) Average Glucose:  168 mg/dL (previously was 563 mg/dL) Glucose Management Indicator: 7.3% (previously was 9.6%) Glucose Variability: 33.8% (previously was 32.1%) (goal <36%) Time in Range:  - Time above range >250: 10% (previously was 52%) (typical goal: <5%) - Time above range 181-250: 25% (previously was 26%) (typical goal <20%) - Time in range 70-180: 65% (previously was 22%) (typical goal >=70%) - Time below range 54-69: 0% (typical goal <4%) - Time below range < 54: 0% (typical goal <1%)  Trends Noted - blood glucose much improved since restarted Trulicity.  Patient did have 2 lows in the 60's while at work over the last 2 weeks - one on 11/30/2023 and another on 12/08/2023. Blood glucose improved quickly - with spike after treated hypoglycemic event.         Objective:  Lab Results  Component Value Date   HGBA1C 11.3 (H) 11/06/2023    Lab Results  Component Value Date   CREATININE 1.35 11/06/2023   BUN 22 11/06/2023   NA 133 (L) 11/06/2023   K 3.3 (L) 11/06/2023   CL 94 (L) 11/06/2023  CO2 29 11/06/2023    Lab Results  Component Value Date   CHOL 137 09/13/2023   HDL 57.60 09/13/2023   LDLCALC 65 09/13/2023   TRIG 74.0 09/13/2023   CHOLHDL 2 09/13/2023    Medications Reviewed Today   Medications were not reviewed in this encounter      Assessment/Plan:  Medication Management:  - reviewed each medication with patient. Patient states [employer] is sending his aspirin in the mail  Diabetes:Currently uncontrolled  - Reviewed Continuous Glucose Monitor  report with patient.  - Lower dose of Lantus to 16 units daily. - Continue Trulicity 0.75mg  weekly for 2 more doses. He will then increase to 1.5mg  weekly. We are hoping he will be able to fill 1 more time in 2024.  - Also requested pharmacy to see if atorvastatin and losartan can be filled 12/19/2023 - Discussed how to treat hypoglycemia - blood glucose < 80. Recommended 4 ounces of soda or juice or 3 to 4 glucose tablets or pieces of sugar / hard candy. If blood glucose not > 80 after 15 minutes he is not repeat. Follow with eating a small meal or snack.   Follow Up Plan: Will check in with patient to review Continuous Glucose Monitor in 2 to 3 weeks.   Henrene Pastor, PharmD Clinical Pharmacist [facility] Primary Care  Population Health [phone]

## 2024-01-08 ENCOUNTER — Other Ambulatory Visit: Payer: Self-pay | Admitting: Pharmacist

## 2024-01-08 ENCOUNTER — Other Ambulatory Visit (HOSPITAL_COMMUNITY): Payer: Self-pay

## 2024-01-08 ENCOUNTER — Ambulatory Visit (INDEPENDENT_AMBULATORY_CARE_PROVIDER_SITE_OTHER): Payer: 59

## 2024-01-08 DIAGNOSIS — I639 Cerebral infarction, unspecified: Secondary | ICD-10-CM | POA: Diagnosis not present

## 2024-01-08 LAB — CUP PACEART REMOTE DEVICE CHECK
Date Time Interrogation Session: 20250119232541
Implantable Pulse Generator Implant Date: 20240730

## 2024-01-08 MED ORDER — TRULICITY 1.5 MG/0.5ML ~~LOC~~ SOAJ
1.5000 mg | SUBCUTANEOUS | 1 refills | Status: DC
  Filled 2024-01-08: qty 2, 28d supply, fill #0

## 2024-01-08 MED ORDER — TRULICITY 1.5 MG/0.5ML ~~LOC~~ SOAJ
1.5000 mg | SUBCUTANEOUS | 1 refills | Status: DC
  Filled 2024-01-08: qty 2, 28d supply, fill #0
  Filled 2024-02-27: qty 2, 28d supply, fill #1

## 2024-01-08 NOTE — Telephone Encounter (Signed)
Called Dylan Abbott pharmacy to check cost of Dylan Abbott Trulicity. They were able to use Trulicity discount card and it lowered cost from $900 to $25. The discount card will lower cost to $25 up to total yearly coverage of $1950 so he should get at least 1 more refill at the $25 cost and will get him closer to meeting his yearly deductible.   Trulicity will be mailed to patient from [facility].  Lm on VM of patient about Trulicity cost and refill.

## 2024-01-08 NOTE — Telephone Encounter (Signed)
Patient is due to escalate dose of Trulicity form 0.75mg  to 1.5mg . Rx sent to pharmacy for Turlicity 1.5mg  weekly

## 2024-01-08 NOTE — Progress Notes (Signed)
Carelink Summary Report / Loop Recorder 

## 2024-01-16 ENCOUNTER — Other Ambulatory Visit: Payer: Self-pay

## 2024-01-16 ENCOUNTER — Other Ambulatory Visit: Payer: Self-pay | Admitting: Pharmacist

## 2024-01-16 ENCOUNTER — Other Ambulatory Visit (HOSPITAL_COMMUNITY): Payer: Self-pay

## 2024-01-16 MED ORDER — DEXCOM G6 RECEIVER DEVI
0 refills | Status: DC
  Filled 2024-01-16: qty 1, 30d supply, fill #0

## 2024-01-16 NOTE — Progress Notes (Signed)
01/16/2024 Name: Dylan Abbott MRN: [id number] DOB: [birth year]  Chief Complaint  Patient presents with   Diabetes    Dylan Abbott is a 58 y.o. year old male who presented for a phone visit with the  Clinical Pharmacist today    They were referred to the pharmacist by their PCP for assistance in managing diabetes, medication access, and complex medication management.    Subjective:  Medication Access/Adherence  Patient stated Continuous Glucose Monitor - Libre Continuous Glucose Monitor - about 10 weeks ago. He has been doing well with the exceptionof the most recent sensor which fell off before the full 15 days. His blood glucose has improved significantly.   Patient is using Freestyle Libre 3 Plus sensors. He had purchased 6 Libre 3+ sensors from [employer] but these are not preferred by his insurance and he paid $200 for them. He has 3 sensors left.   His insurance plan prefers DexCom G6. He has had 1 month of DexCom filled at [facility] but we have not stated yet - plan to use Freestyle Libre 3+ sensors and then transition over the DexCom 6. However patient's cell phone is not compatible with the DexCom .   Current Pharmacy:  [id number] [place], [place] - [address] AT [facility] & [address] [place] [address] Phone: [phone] Fax: [phone]  [place] [facility] [address] [place] [address] Phone: [phone] Fax: [phone]   Patient reports affordability concerns with their medications: Yes  - only when he has not met deductible. He has not filled any medications in 2025 yet.  Patient reports access/transportation concerns to their pharmacy: No  Patient reports adherence concerns with their medications:  No   - but refill history does show some inconsistencies in regular refills   Diabetes:  Current medications:  Lantus 15 units daily (takes around 6pm when he leaves  for work); Trulicity 0.75mg  weekly (he was to increase to 1.5mg  weekly but has not started new dose yet because he was trying to use up 0.75mg  doses he had on hand); metformin 850mg  twice a day and glipizide 10mg  daily.   CGM Documentation - this report is from 01/03/2024 to 01/16/2024  Days Worn: 14 days  % Time CGM is active: 83% (goal >=70%) Average Glucose:  180mg /dL (previously was 630 mg/dL) Glucose Management Indicator: 7.6% (previously was 7.3%) Glucose Variability: 34.4% (previously was 33.8%) (goal <36%) Time in Range:  - Time above range >250: 14% (previously was 10%) (typical goal: <5%) - Time above range 181-250: 28% (previously was 25%) (typical goal <20%) - Time in range 70-180: 58% (previously was 65%) (typical goal >=70%) - Time below range 54-69: 0% (typical goal <4%) - Time below range < 54: 0% (typical goal <1%)  Trends Noted - still seeing postprandial blood glucose excursions. Only noted 1 low blood glucose which occurred at work.    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   Exercise - none currently, has an active job.    Hyperlipidemia/ASCVD Risk Reduction  Current lipid lowering medications:  atorvastatin 40mg  once a day and ezetimibe 10mg  daily He also had 2 bottles of atrovastatin and patient thought he was taking 1 tablet from each bottle  Antiplatelet regimen: aspirin 81mg  daily   Patient also reports he is only taking potassium  10 mEq twice a day but per last CMP patient was to increase to take per day and recheck labs in 1 week. He has not rechecked labs.    Objective:  Lab Results  Component Value Date   HGBA1C 11.3 (H) 11/06/2023    Lab Results  Component Value Date   CREATININE 1.35 11/06/2023   BUN 22 11/06/2023   NA 133 (L) 11/06/2023   K 3.3 (L) 11/06/2023   CL 94 (L) 11/06/2023   CO2 29 11/06/2023    Lab Results  Component  Value Date   CHOL 137 09/13/2023   HDL 57.60 09/13/2023   LDLCALC 65 09/13/2023   TRIG 74.0 09/13/2023   CHOLHDL 2 09/13/2023    Medications Reviewed Today     Reviewed by Henrene Pastor, RPH-CPP (Pharmacist) on 01/16/24 at 1350  Med List Status: <None>   Medication Order Taking? Sig Documenting Provider Last Dose Status Informant  amLODipine (NORVASC) 2.5 MG tablet [id number] Yes Take 1 tablet (2.5 mg total) by mouth daily. Shade Flood, MD Taking Active   aspirin EC 81 MG tablet [id number] Yes Take 1 tablet (81 mg total) by mouth daily. Shade Flood, MD Taking Active   atorvastatin (LIPITOR) 40 MG tablet [id number] Yes Take 1 tablet (40 mg total) by mouth at bedtime. Shade Flood, MD Taking Active   cetirizine (ZYRTEC) 10 MG tablet [id number] Yes Take 1 tablet (10 mg total) by mouth daily.  Patient taking differently: Take 10 mg by mouth daily as needed for allergies.   Shade Flood, MD Taking Active   Dulaglutide (TRULICITY) 1.5 MG/0.5ML Ivory Broad [id number] No Inject 1.5 mg into the skin every 7 (seven) days.  Patient not taking: Reported on 01/16/2024   Shade Flood, MD Not Taking Active            Med Note [facility] [place] B   Tue Jan 16, 2024  1:13 PM) Hasn't started this dose yet - plans to start Sunday 01/21/24  ezetimibe (ZETIA) 10 MG tablet [id number] No Take 1 tablet (10 mg total) by mouth daily.  Patient not taking: Reported on 01/16/2024   Shade Flood, MD Not Taking Active   glipiZIDE (GLUCOTROL) 10 MG tablet [id number] Yes Take 1 tablet (10 mg total) by mouth 2 (two) times daily before a meal. Shade Flood, MD Taking Active   insulin glargine (LANTUS) 100 UNIT/ML Solostar Pen [id number] Yes Inject 12 Units into the skin daily.  Patient taking differently: Inject 15 Units into the skin daily.   Shade Flood, MD Taking Active   Insulin Pen Needle (UNIFINE PENTIPS) 32G X 4 MM MISC [id number]  Use daily as directed Shade Flood, MD  Active    losartan (COZAAR) 100 MG tablet [id number] Yes Take 1 tablet (100 mg total) by mouth daily. Shade Flood, MD Taking Active   metFORMIN (GLUCOPHAGE) 850 MG tablet [id number] Yes Take 1 tablet (850 mg total) by mouth 2 (two) times daily with a meal. Shade Flood, MD Taking Active   omeprazole (PRILOSEC) 20 MG capsule [id number] No Take 1 capsule (20 mg total) by mouth daily.  Patient not taking: Reported on 01/16/2024   Shade Flood, MD Not Taking Active   potassium chloride (KLOR-CON) 10 MEQ tablet [id number] Yes Take 2 tablets (20 mEq total) by mouth daily.  Patient taking differently: Take 10 mEq by mouth 2 (two) times daily.   Shade Flood, MD Taking Active  Med Note [facility] Braun, [place] [place] B   Thu Nov 09, 2023  2:40 PM) Dose increased per lab notes 11/06/23  triamcinolone cream (KENALOG) 0.1 % [id number] No Apply 1 Application topically 2 (two) times daily.  Patient not taking: Reported on 01/16/2024   Shade Flood, MD Not Taking Active              Assessment/Plan:  Medication Management:  - reviewed each medication with patient.  - Recommended he increase potassium to take 10 mEq - 3 tablets = 30 mEq per day and I will check with Dr Neva Seat about ordering labs - pt would prefer to have labs drawn at [facility] if possible.   Diabetes:Currently uncontrolled  but Continuous Glucose Monitor show improved BG - reviewed diet - limit intake of high CHO foods.  - Continue Lantus to 15 units daily. - Increase Trulicity to 1.5mg  weekly - Called patient's insurance to see if we could get a prior authorization for Libre 3+ sensors since his phone is not compatible with the G6 app. Per insurance there is no exception for Libre 3+. Sent in order for G6 reader.    Hyperlipidemia/ASCVD Risk Reduction - Patient advised to combine the 2 bottles of atorvastatin 40mg  he has and to only take 1 tablet daily  - Continue ezetimibe 10mg  daily.     Follow Up Plan: Will check in  with patient to review Continuous Glucose Monitor in 2 to 3 weeks.   Henrene Pastor, PharmD Clinical Pharmacist [facility]  Population Health [phone]

## 2024-01-17 ENCOUNTER — Other Ambulatory Visit: Payer: Self-pay | Admitting: Family Medicine

## 2024-01-17 DIAGNOSIS — E876 Hypokalemia: Secondary | ICD-10-CM

## 2024-01-17 NOTE — Progress Notes (Signed)
Prior hypokalemia.

## 2024-01-19 ENCOUNTER — Other Ambulatory Visit: Payer: Self-pay | Admitting: Family Medicine

## 2024-01-19 ENCOUNTER — Telehealth: Payer: Self-pay | Admitting: Pharmacist

## 2024-01-19 DIAGNOSIS — E785 Hyperlipidemia, unspecified: Secondary | ICD-10-CM

## 2024-01-19 NOTE — Telephone Encounter (Signed)
Patient was notified about lab order and will go on Monday 01/22/2024

## 2024-01-19 NOTE — Telephone Encounter (Signed)
-----   Message from Shade Flood sent at 01/17/2024  5:34 PM EST ----- Looking back at his last lab note, it appears that we were unable to contact him with those results and plan.  I have placed an order for a BMP, and that can be drawn at [facility] across from [place] [facility].  Do mind contacting him with this information or would you like our staff to handle it?  Thanks!  JG  [employer] in 8:30-4:30 during weekdays, no appointment needed [address]  [place], [place] [address] ----- Message ----- From: Henrene Pastor, RPH-CPP Sent: 01/16/2024   4:10 PM EST To: Shade Flood, MD  Dylan Abbott was supposed to have his potassium rechecked after his last labs but he has not had checked yet. His next appt with you is 02/10/2024 - do you want him to go ahead and have potassium checked? He would prefer to have labs drawn at lab at [facility] if possible.

## 2024-01-24 ENCOUNTER — Other Ambulatory Visit (INDEPENDENT_AMBULATORY_CARE_PROVIDER_SITE_OTHER): Payer: Self-pay

## 2024-01-24 DIAGNOSIS — E876 Hypokalemia: Secondary | ICD-10-CM

## 2024-01-24 LAB — BASIC METABOLIC PANEL
BUN: 20 mg/dL (ref 6–23)
CO2: 31 meq/L (ref 19–32)
Calcium: 8.8 mg/dL (ref 8.4–10.5)
Chloride: 100 meq/L (ref 96–112)
Creatinine, Ser: 1.11 mg/dL (ref 0.40–1.50)
GFR: 73.27 mL/min (ref >60.00)
Glucose, Bld: 109 mg/dL — ABNORMAL HIGH (ref 70–99)
Potassium: 3.2 meq/L — ABNORMAL LOW (ref 3.5–5.1)
Sodium: 140 meq/L (ref 135–145)

## 2024-01-30 ENCOUNTER — Other Ambulatory Visit: Payer: Self-pay | Admitting: Family Medicine

## 2024-01-30 ENCOUNTER — Telehealth: Payer: Self-pay

## 2024-01-30 ENCOUNTER — Other Ambulatory Visit: Payer: Self-pay | Admitting: Pharmacist

## 2024-01-30 DIAGNOSIS — E876 Hypokalemia: Secondary | ICD-10-CM

## 2024-01-30 MED ORDER — POTASSIUM CHLORIDE CRYS ER 10 MEQ PO TBCR
30.0000 meq | EXTENDED_RELEASE_TABLET | Freq: Every day | ORAL | 1 refills | Status: DC
  Filled 2024-01-30: qty 270, 90d supply, fill #0

## 2024-01-30 NOTE — Telephone Encounter (Signed)
-----   Message from Shade Flood sent at 01/28/2024  2:44 PM EST ----- Call patient.  Potassium level was still low.  Is he taking the supplement?  Should be on at least 20 mill equivalents per day but clarify if he is taking this dose and if he has missed any doses lately.  That can help me decide on next step in treatment.

## 2024-01-30 NOTE — Progress Notes (Signed)
Noted pharmacist visit today. potassium daily ordered.

## 2024-01-30 NOTE — Telephone Encounter (Signed)
Patient has been informed notes did not miss doses and has been taking 20 meq as recommend

## 2024-01-30 NOTE — Telephone Encounter (Signed)
Take additional 20 mg dose today (total dose of 40 mg today, then 1 and 1/2 pills each day with repeat BMP in the next 1 week.  Diagnosis hypokalemia.

## 2024-01-30 NOTE — Telephone Encounter (Signed)
Yes, I see that note from her now.  3 of the 10 meq tablets/day is fine. Thanks.

## 2024-01-30 NOTE — Progress Notes (Unsigned)
01/30/2024 Name: Dylan Abbott MRN: [id number] DOB: Oct 04, 1966  Chief Complaint  Patient presents with   Diabetes   Medication Management   Hypertension    Dylan Abbott is a 58 y.o. year old male who presented for a phone visit with the  Clinical Pharmacist today    They were referred to the pharmacist by their PCP for assistance in managing diabetes, medication access, and complex medication management.    Subjective:  Medication Access/Adherence  Patient is using Libre Continuous Glucose Monitor. . He has been doing well and blood glucose is showing improvement.    Patient is using Freestyle Libre 3 Plus sensors. He had purchased 6 Libre 3+ sensors from [place] but these are not preferred by his insurance from 2024. Today patient reports he has a new insurance thru his company but has not received a card yet and he is not sure the name of the new insurance plan.   In 2024, his insurance plan preferred DexCom G6. He has had 1 month of DexCom filled at [facility] but we have not stated yet - plan to use Freestyle Libre 3+ sensors and then transition over the DexCom 6. However at out last visit I was unable to download the [facility] app because patient's cell phone is not compatible with the [facility] app    Current Pharmacy:  [facility] #[id number] [place], [place] - [address] AT [facility] & [address] [place] [address] Phone: [phone] Fax: [phone]  [place] - [facility] [address] [place] [address] Phone: [phone] Fax: [phone]   Patient reports affordability concerns with their medications: Yes  - only when he has not met deductible.   Patient reports access/transportation concerns to their pharmacy: No  Patient reports adherence concerns with their medications:  No   - but refill history does show some inconsistencies in regular refills   Diabetes:  Current medications:   Lantus 15 units daily (takes around 6pm when he leaves for work); Trulicity 1.5mg  weekly (he increased to 1.5mg  weekly 2 weeks ago.)   metformin 850mg  twice a day and glipizide 10mg  daily.   CGM Documentation - this report is from 01/17/2024 to 01/30/2024  Days Worn: 14 days  % Time CGM is active: 91% (goal >=70%) Average Glucose:  165 mg/dL (previously was 329 mg/dL) Glucose Management Indicator: 7.3% (previously was 7.6%) Glucose Variability: 35.9% (previously was 34.4%) (goal <36%) Time in Range:  - Time above range >250: 10% (previously was 14%) (typical goal: <5%) - Time above range 181-250: 25% (previously was 28%) (typical goal <20%) - Time in range 70-180: 65% (previously was 58%) (typical goal >=70%) - Time below range 54-69: 0% (typical goal <4%) - Time below range < 54: 0% (typical goal <1%)  Trends Noted - still seeing postprandial blood glucose excursions. Noted 3 low blood glucose which occurred at work and all were short in duration.       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   Exercise - none currently, has an active job.    Hyperlipidemia/ASCVD Risk Reduction  Current lipid lowering medications:  atorvastatin 40mg  once a day and ezetimibe 10mg  daily  Antiplatelet regimen: aspirin 81mg  daily  Hypertension:  Current therapy: amlodipine 2.5mg  daily and losartan 100mg  daily  Does not check blood pressure at home.  Patient also reports he is taking potassium 10 mEq twice a day. He had BMP checked last week but office has not been able to reach him to discuss results. Potassium was low at 3.2.   Objective:  Lab Results  Component Value Date   HGBA1C 11.3 (H) 11/06/2023    Lab Results  Component Value Date   CREATININE 1.11 01/24/2024   BUN 20 01/24/2024   NA 140 01/24/2024   K 3.2 (L) 01/24/2024   CL 100 01/24/2024   CO2 31 01/24/2024    Lab Results   Component Value Date   CHOL 137 09/13/2023   HDL 57.60 09/13/2023   LDLCALC 65 09/13/2023   TRIG 74.0 09/13/2023   CHOLHDL 2 09/13/2023    Medications Reviewed Today     Reviewed by Henrene Pastor, RPH-CPP (Pharmacist) on 01/30/24 at 1332  Med List Status: <None>   Medication Order Taking? Sig Documenting Provider Last Dose Status Informant  amLODipine (NORVASC) 2.5 MG tablet [id number] Yes Take 1 tablet (2.5 mg total) by mouth daily. Shade Flood, MD Taking Active   aspirin EC 81 MG tablet [id number] Yes Take 1 tablet (81 mg total) by mouth daily. Shade Flood, MD Taking Active   atorvastatin (LIPITOR) 40 MG tablet [id number] Yes Take 1 tablet (40 mg total) by mouth at bedtime. Shade Flood, MD Taking Active   cetirizine (ZYRTEC) 10 MG tablet [id number]  Take 1 tablet (10 mg total) by mouth daily.  Patient taking differently: Take 10 mg by mouth daily as needed for allergies.   Shade Flood, MD  Active   Continuous Glucose Receiver [facility] G6 RECEIVER) DEVI [id number] No Use with G6 sensors to check blood glucose several times a day.  Patient not taking: Reported on 01/30/2024   Shade Flood, MD Not Taking Active   Dulaglutide (TRULICITY) 1.5 MG/0.5ML Ivory Broad [id number] Yes Inject 1.5 mg into the skin every 7 (seven) days. Shade Flood, MD Taking Active            Med Note [facility] B   Tue Jan 30, 2024  1:22 PM)    ezetimibe (ZETIA) 10 MG tablet [id number] Yes Take 1 tablet (10 mg total) by mouth daily. Shade Flood, MD Taking Active   glipiZIDE (GLUCOTROL) 10 MG tablet [id number] Yes Take 1 tablet (10 mg total) by mouth 2 (two) times daily before a meal. Shade Flood, MD Taking Active   insulin glargine (LANTUS) 100 UNIT/ML Solostar Pen [id number] Yes Inject 12 Units into the skin daily.  Patient taking differently: Inject 15 Units into the skin daily.   Shade Flood, MD Taking Active   Insulin Pen Needle (UNIFINE PENTIPS) 32G X 4 MM MISC  [id number] Yes Use daily as directed Shade Flood, MD Taking Active   losartan (COZAAR) 100 MG tablet [id number] Yes Take 1 tablet (100 mg total) by mouth daily. Shade Flood, MD Taking Active   metFORMIN (GLUCOPHAGE) 850 MG tablet [id number] Yes Take 1 tablet (850 mg total) by mouth 2 (two) times daily with a meal. Shade Flood, MD Taking Active   omeprazole (PRILOSEC) 20 MG capsule [id number] No Take 1 capsule (20 mg total) by mouth daily.  Patient not taking: Reported on 01/30/2024   Shade Flood, MD Not Taking Active   potassium chloride (KLOR-CON) 10 MEQ tablet [id number] Yes Take 2 tablets (20 mEq total) by mouth daily.  Patient taking differently: Take 10 mEq by mouth 2 (two) times  daily.   Shade Flood, MD Taking Active            Med Note [facility], Cindie Laroche Nov 09, 2023  2:40 PM) Dose increased per lab notes 11/06/23  triamcinolone cream (KENALOG) 0.1 % [id number] No Apply 1 Application topically 2 (two) times daily.  Patient not taking: Reported on 01/30/2024   Shade Flood, MD Not Taking Active              Assessment/Plan:  Medication Management:  - reviewed each medication with patient.  - Recommended he increase potassium to take 10 mEq - 3 tablets = 30 mEq per day. I will notify PCP and request updated Rx to be sent to [facility]. - Reviewed medications. He will need the following medications refilled soon - amlodipine, glipizide, metformin and potassium. Will coordinate with his pharmacy.   Diabetes:Currently uncontrolled  but Continuous Glucose Monitor show improved BG - reviewed diet - limit intake of high CHO foods.  - Continue Lantus to 15 units daily. - Continue Trulicity to 1.5mg  weekly, glipizide 10mg  and metformin 850mg  - Patient will call me once he has information from HR department about 2025 insurance so I can check to see if Libre 3+ sensors will be covered.     Hyperlipidemia/ASCVD Risk Reduction - Continue atorvastatin 40mg   daily  - Continue ezetimibe 10mg  daily.     Follow Up Plan: Sees PCP 02/08/2024; follow up with clinical pharmacist 4 weeks.   Henrene Pastor, PharmD Clinical Pharmacist [facility]  Population Health [phone]

## 2024-01-30 NOTE — Telephone Encounter (Signed)
Patient is confused, he was advised otherwise by [facility] pharmacist a little bit ago during appt with her so now he is unsure how to proceed   She advised he take 2 10 meq tablets in am and 1 in pm going forward, please advise this should equal the same dose as you recommended but wanted to triple check since patient has 10 meq tabs and not 

## 2024-01-31 ENCOUNTER — Other Ambulatory Visit: Payer: Self-pay

## 2024-01-31 ENCOUNTER — Other Ambulatory Visit (HOSPITAL_COMMUNITY): Payer: Self-pay

## 2024-01-31 NOTE — Telephone Encounter (Signed)
Patient has been advised

## 2024-02-01 ENCOUNTER — Other Ambulatory Visit: Payer: Self-pay | Admitting: Family Medicine

## 2024-02-01 DIAGNOSIS — E1165 Type 2 diabetes mellitus with hyperglycemia: Secondary | ICD-10-CM

## 2024-02-02 ENCOUNTER — Other Ambulatory Visit (HOSPITAL_COMMUNITY): Payer: Self-pay

## 2024-02-02 ENCOUNTER — Other Ambulatory Visit: Payer: Self-pay | Admitting: Family Medicine

## 2024-02-02 DIAGNOSIS — E1165 Type 2 diabetes mellitus with hyperglycemia: Secondary | ICD-10-CM

## 2024-02-02 DIAGNOSIS — I10 Essential (primary) hypertension: Secondary | ICD-10-CM

## 2024-02-02 MED FILL — Glipizide Tab 10 MG: ORAL | 90 days supply | Qty: 180 | Fill #0 | Status: AC

## 2024-02-02 MED FILL — Metformin HCl Tab 850 MG: ORAL | 90 days supply | Qty: 180 | Fill #0 | Status: AC

## 2024-02-08 ENCOUNTER — Ambulatory Visit (INDEPENDENT_AMBULATORY_CARE_PROVIDER_SITE_OTHER): Payer: Self-pay | Admitting: Family Medicine

## 2024-02-08 VITALS — BP 122/80 | HR 93 | Temp 98.2°F | Ht 72.0 in | Wt 233.0 lb

## 2024-02-08 DIAGNOSIS — Z23 Encounter for immunization: Secondary | ICD-10-CM

## 2024-02-08 DIAGNOSIS — I10 Essential (primary) hypertension: Secondary | ICD-10-CM | POA: Diagnosis not present

## 2024-02-08 DIAGNOSIS — E785 Hyperlipidemia, unspecified: Secondary | ICD-10-CM

## 2024-02-08 DIAGNOSIS — E876 Hypokalemia: Secondary | ICD-10-CM

## 2024-02-08 DIAGNOSIS — Z7984 Long term (current) use of oral hypoglycemic drugs: Secondary | ICD-10-CM

## 2024-02-08 DIAGNOSIS — E1165 Type 2 diabetes mellitus with hyperglycemia: Secondary | ICD-10-CM | POA: Diagnosis not present

## 2024-02-08 DIAGNOSIS — Z7985 Long-term (current) use of injectable non-insulin antidiabetic drugs: Secondary | ICD-10-CM

## 2024-02-08 NOTE — Patient Instructions (Addendum)
Sample of libre sensor until we can determine what monitor will be covered.  Try to eat a healthy snack at about 4 hours after previous meal to see if that lessens low blood sugar risk.  No med changes for now.  You did have an appointment scheduled with diabetes specialist on 01/03/24. Please call their office to try to reschedule appointment.   Dylan Thapa, MD Endocrinologist in [place] Address: [address] [place], [place] [address] Phone: [phone]  If any concern on labs I will let you know.  Stop amlodipine for now since you are taking nifedipine. Recheck in 2 weeks to review labs and adjust meds.   Return to the clinic or go to the nearest emergency room if any of your symptoms worsen or new symptoms occur.

## 2024-02-08 NOTE — Progress Notes (Signed)
 Subjective:  Patient ID: Dylan Abbott, male    DOB: Jan 31, 1966  Age: 58 y.o. MRN: [id number]  CC:  Chief Complaint  Patient presents with   Medical Management of Chronic Issues    Pt is doing okay, no concerns     HPI Dylan Abbott presents for   Diabetes:  Complicated by hyperglycemia, CVD with prior suspected TIA and medication adherence difficulty previously.  Referred to endocrinology previously.  No show for 1/15 endocrine appt - he did not know about this.   Last visit with me in December.  He is also followed by clinical pharmacist.  Last visit noted February 11. Freestyle libre 3 with plan to transition to Dexcom G6 for improved insurance coverage. Average glucose 165 based on CGM data February 11.  10% above 250.  Less than 5% below 69. Lantus 15 units daily, Trulicity 1.5 mg weekly, metformin 150 mg twice daily, glipizide 10 mg daily.  No changes at his pharmacy visit. He is on statin and Zetia for ASCVD risk reduction. He is on ARB with losartan 100 mg daily.  Ran out of libre 3 yesterday. He is not sure if it will be covered.   Last low blood sugar few days ago - noticed at work - 20. Did not feel anything - alarm on CGM went off - had a snack.  Eating 2 times per day.  Low reading above prior to 2nd meal ( 5 hours   Microalbumin: 4.0 on 11/06/23.  Optho, foot exam, pneumovax:  Optho - appt in March.  Prevnar 20 today.  Immunization History  Administered Date(s) Administered   PFIZER(Purple Top)SARS-COV-2 Vaccination 04/15/2020, 05/11/2020   Pneumococcal Polysaccharide-23 07/25/2019     Lab Results  Component Value Date   HGBA1C 11.3 (H) 11/06/2023   HGBA1C 12.2 (H) 07/18/2023   HGBA1C 12.1 (H) 07/18/2023   Lab Results  Component Value Date   MICROALBUR 4.0 (H) 11/06/2023   LDLCALC 65 09/13/2023   CREATININE 1.11 01/24/2024   Hypertension: With hypokalemia.  Some delay in repeat testing with prior low potassium.  Recent reading was still low,  plan for increase potassium to 30 mEq total per day.  Recent increase from 20 meqs total per day.  Continues on amlodipine 2.5 mg daily, losartan 100 mg daily for hypertension. Has been on nifedipine 90mg  in past. Took one this morning. He states he is still taking nifedipine 90mg  every day.  Home readings: none BP Readings from Last 3 Encounters:  02/08/24 122/80  12/07/23 (!) 144/88  11/06/23 (!) 158/80   Lab Results  Component Value Date   K 3.2 (L) 01/24/2024    Lab Results  Component Value Date   CREATININE 1.11 01/24/2024   Hyperlipidemia: Lipitor 40mg  every day, zetia 10mg  every day.  Lab Results  Component Value Date   CHOL 137 09/13/2023   HDL 57.60 09/13/2023   LDLCALC 65 09/13/2023   TRIG 74.0 09/13/2023   CHOLHDL 2 09/13/2023   Lab Results  Component Value Date   ALT 18 11/06/2023   AST 24 11/06/2023   ALKPHOS 64 11/06/2023   BILITOT 0.5 11/06/2023       History Patient Active Problem List   Diagnosis Date Noted   NSTEMI (non-ST elevated myocardial infarction) (HCC) 07/16/2023   Impingement syndrome of left shoulder 07/30/2020   COVID-19 virus infection 10/31/2019   Abnormal liver function 10/31/2019   Hypokalemia 10/31/2019   Hyponatremia 10/31/2019   Diabetes mellitus (HCC) 11/13/2017   Hyperlipidemia 11/13/2017  Hypertensive disorder 11/13/2017   Past Medical History:  Diagnosis Date   Diabetes mellitus without complication (HCC)    Hyperlipidemia    Hypertension    Past Surgical History:  Procedure Laterality Date   LOOP RECORDER INSERTION N/A 07/18/2023   Procedure: LOOP RECORDER INSERTION;  Surgeon: Graciella Freer, PA-C;  Location: [facility];  Service: Cardiovascular;  Laterality: N/A;   No Known Allergies Prior to Admission medications   Medication Sig Start Date End Date Taking? Authorizing Provider  amLODipine (NORVASC) 2.5 MG tablet Take 1 tablet (2.5 mg total) by mouth daily. 11/06/23  Yes Shade Flood, MD   aspirin EC 81 MG tablet Take 1 tablet (81 mg total) by mouth daily. 12/07/23  Yes Shade Flood, MD  atorvastatin (LIPITOR) 40 MG tablet Take 1 tablet (40 mg total) by mouth at bedtime. 08/03/23  Yes Shade Flood, MD  Dulaglutide (TRULICITY) 1.5 MG/0.5ML SOAJ Inject 1.5 mg into the skin every 7 (seven) days. 01/08/24  Yes Shade Flood, MD  ezetimibe (ZETIA) 10 MG tablet Take 1 tablet (10 mg total) by mouth daily. 09/13/23  Yes Shade Flood, MD  glipiZIDE (GLUCOTROL) 10 MG tablet Take 1 tablet (10 mg total) by mouth 2 (two) times daily before a meal. 02/01/24  Yes Shade Flood, MD  insulin glargine (LANTUS) 100 UNIT/ML Solostar Pen Inject 12 Units into the skin daily. Patient taking differently: Inject 15 Units into the skin daily. 09/13/23  Yes Shade Flood, MD  Insulin Pen Needle (UNIFINE PENTIPS) 32G X 4 MM MISC Use daily as directed 08/03/23  Yes Shade Flood, MD  losartan (COZAAR) 100 MG tablet Take 1 tablet (100 mg total) by mouth daily. 08/03/23  Yes Shade Flood, MD  metFORMIN (GLUCOPHAGE) 850 MG tablet Take 1 tablet (850 mg total) by mouth 2 (two) times daily with a meal. 02/01/24  Yes Shade Flood, MD  potassium chloride (KLOR-CON M) 10 MEQ tablet Take 3 tablets (30 mEq total) by mouth daily. 01/30/24  Yes Shade Flood, MD  potassium chloride (KLOR-CON) 10 MEQ tablet Take 2 tablets (20 mEq total) by mouth daily. Patient taking differently: Take 10 mEq by mouth 2 (two) times daily. 07/10/23  Yes Shade Flood, MD  cetirizine (ZYRTEC) 10 MG tablet Take 1 tablet (10 mg total) by mouth daily. Patient not taking: Reported on 02/08/2024 08/09/23   Shade Flood, MD  Continuous Glucose Receiver (DEXCOM G6 RECEIVER) DEVI Use with G6 sensors to check blood glucose several times a day. Patient not taking: Reported on 02/08/2024 01/16/24   Shade Flood, MD  omeprazole (PRILOSEC) 20 MG capsule Take 1 capsule (20 mg total) by mouth daily. Patient not  taking: Reported on 01/16/2024 10/20/23   Shade Flood, MD  triamcinolone cream (KENALOG) 0.1 % Apply 1 Application topically 2 (two) times daily. Patient not taking: Reported on 01/16/2024 09/27/23   Shade Flood, MD   Social History   Socioeconomic History   Marital status: Married    Spouse name: Not on file   Number of children: 2   Years of education: Not on file   Highest education level: Not on file  Occupational History   Occupation: environmental  Tobacco Use   Smoking status: Never   Smokeless tobacco: Never  Substance and Sexual Activity   Alcohol use: Never    Alcohol/week: 0.0 standard drinks of alcohol   Drug use: Never   Sexual activity: Yes  Other Topics Concern   Not on file  Social History Narrative   Not on file   Social Drivers of Health   Financial Resource Strain: Not on file  Food Insecurity: No Food Insecurity (07/17/2023)   Hunger Vital Sign    Worried About Running Out of Food in the Last Year: Never true    Ran Out of Food in the Last Year: Never true  Transportation Needs: No Transportation Needs (07/17/2023)   PRAPARE - Administrator, Civil Service (Medical): No    Lack of Transportation (Non-Medical): No  Physical Activity: Not on file  Stress: Not on file  Social Connections: Not on file  Intimate Partner Violence: Not At Risk (07/17/2023)   Humiliation, Afraid, Rape, and Kick questionnaire    Fear of Current or Ex-Partner: No    Emotionally Abused: No    Physically Abused: No    Sexually Abused: No    Review of Systems  Constitutional:  Negative for fatigue and unexpected weight change.  Eyes:  Negative for visual disturbance.  Respiratory:  Negative for cough, chest tightness and shortness of breath.   Cardiovascular:  Negative for chest pain, palpitations and leg swelling.  Gastrointestinal:  Negative for abdominal pain and blood in stool.  Neurological:  Positive for headaches (rare. no persistent HA.). Negative  for dizziness and light-headedness.     Objective:   Vitals:   02/08/24 1440  BP: 122/80  Pulse: 93  Temp: 98.2 F (36.8 C)  TempSrc: Temporal  SpO2: 99%  Weight: 233 lb (105.7 kg)  Height: 6' (1.829 m)     Physical Exam Vitals reviewed.  Constitutional:      Appearance: He is well-developed.  HENT:     Head: Normocephalic and atraumatic.  Neck:     Vascular: No carotid bruit or JVD.  Cardiovascular:     Rate and Rhythm: Normal rate and regular rhythm.     Heart sounds: Normal heart sounds. No murmur heard. Pulmonary:     Effort: Pulmonary effort is normal.     Breath sounds: Normal breath sounds. No rales.  Musculoskeletal:     Right lower leg: No edema.     Left lower leg: No edema.  Skin:    General: Skin is warm and dry.  Neurological:     Mental Status: He is alert and oriented to person, place, and time.  Psychiatric:        Mood and Affect: Mood normal.        Assessment & Plan:  Dylan Abbott is a 58 y.o. male . Type 2 diabetes mellitus with hyperglycemia, without long-term current use of insulin (HCC) - Plan: Hemoglobin A1c  -Labs obtained as above.  With few borderline lows we will hold on med changes at this time, phone number provided for endocrinologist to schedule/reschedule appointment as no-show for prior appointment.  Sample of libre 3 given until we can determine CGM coverage.  Note sent to pharmacist.  Appreciate assistance with Dylan Abbott care.  Essential hypertension - Plan: Comprehensive metabolic panel  -Tolerating current regimen.  Check labs and adjust plan accordingly.  Did ask that he stop the amlodipine at this time onto calcium channel blockers, nifedipine at higher dose.  Close monitoring with recheck in 2 weeks, medication adjustment at that time with RTC precautions sooner if needed.  Hyperlipidemia, unspecified hyperlipidemia type - Plan: Lipid panel  -Tolerating current regimen, check labs and adjust plan  accordingly  Hypokalemia - Plan: Comprehensive metabolic  panel  -Now on 30 mEq daily of potassium supplementation with improved testing on labs.  Continue same  Need for pneumococcal vaccination - Plan: Pneumococcal conjugate vaccine 20-valent   No orders of the defined types were placed in this encounter.  Patient Instructions  Sample of libre sensor until we can determine what monitor will be covered.  Try to eat a healthy snack at about 4 hours after previous meal to see if that lessens low blood sugar risk.  No med changes for now.  You did have an appointment scheduled with diabetes specialist on 01/03/24. Please call their office to try to reschedule appointment.   Iraq Thapa, MD Endocrinologist in [place], [place] Address: [address] [place], [place] [address] Phone: [phone]  If any concern on labs I will let you know.  Stop amlodipine for now since you are taking nifedipine. Recheck in 2 weeks to review labs and adjust meds.   Return to the clinic or go to the nearest emergency room if any of your symptoms worsen or new symptoms occur.       Signed,   Meredith Staggers, MD [facility], [facility] 02/08/24 2:49 PM

## 2024-02-09 LAB — LIPID PANEL
Cholesterol: 86 mg/dL (ref 0–200)
HDL: 47.9 mg/dL (ref >39.00)
LDL Cholesterol: 25 mg/dL (ref 0–99)
NonHDL: 38.18
Total CHOL/HDL Ratio: 2
Triglycerides: 66 mg/dL (ref 0.0–149.0)
VLDL: 13.2 mg/dL (ref 0.0–40.0)

## 2024-02-09 LAB — HEMOGLOBIN A1C: Hgb A1c MFr Bld: 8.6 % — ABNORMAL HIGH (ref 4.6–6.5)

## 2024-02-09 LAB — COMPREHENSIVE METABOLIC PANEL
ALT: 17 U/L (ref 0–53)
AST: 26 U/L (ref 0–37)
Albumin: 4.5 g/dL (ref 3.5–5.2)
Alkaline Phosphatase: 59 U/L (ref 39–117)
BUN: 15 mg/dL (ref 6–23)
CO2: 29 meq/L (ref 19–32)
Calcium: 9.4 mg/dL (ref 8.4–10.5)
Chloride: 99 meq/L (ref 96–112)
Creatinine, Ser: 1.2 mg/dL (ref 0.40–1.50)
GFR: 66.7 mL/min (ref >60.00)
Glucose, Bld: 164 mg/dL — ABNORMAL HIGH (ref 70–99)
Potassium: 3.5 meq/L (ref 3.5–5.1)
Sodium: 139 meq/L (ref 135–145)
Total Bilirubin: 0.8 mg/dL (ref 0.2–1.2)
Total Protein: 7.8 g/dL (ref 6.0–8.3)

## 2024-02-10 ENCOUNTER — Encounter: Payer: Self-pay | Admitting: Family Medicine

## 2024-02-12 ENCOUNTER — Ambulatory Visit (INDEPENDENT_AMBULATORY_CARE_PROVIDER_SITE_OTHER): Payer: 59

## 2024-02-12 DIAGNOSIS — I639 Cerebral infarction, unspecified: Secondary | ICD-10-CM

## 2024-02-14 LAB — CUP PACEART REMOTE DEVICE CHECK
Date Time Interrogation Session: 20250223232053
Implantable Pulse Generator Implant Date: 20240730

## 2024-02-15 NOTE — Progress Notes (Signed)
 Carelink Summary Report / Loop Recorder

## 2024-02-22 LAB — HM DIABETES EYE EXAM

## 2024-02-27 ENCOUNTER — Other Ambulatory Visit: Payer: Self-pay | Admitting: Pharmacist

## 2024-02-27 ENCOUNTER — Other Ambulatory Visit (HOSPITAL_COMMUNITY): Payer: Self-pay

## 2024-02-27 ENCOUNTER — Other Ambulatory Visit: Payer: Self-pay

## 2024-02-27 MED ORDER — FREESTYLE LIBRE 3 PLUS SENSOR MISC
2 refills | Status: DC
Start: 2024-02-27 — End: 2024-05-03
  Filled 2024-02-27: qty 2, 30d supply, fill #0

## 2024-02-27 NOTE — Progress Notes (Unsigned)
 03/01/2024 Name: Dylan Abbott MRN: [id number] DOB: May 13, 1966  Chief Complaint  Patient presents with   Diabetes    Dylan Abbott is a 58 y.o. year old male who presented for a phone visit with the  Clinical Pharmacist today    They were referred to the pharmacist by their PCP for assistance in managing diabetes, medication access, and complex medication management.    Subjective:  Medication Access/Adherence  Patient was using Libre 3 Plus Continuous Glucose Monitor but he has run out of sensor. He has been doing well and blood glucose is showing improvement.    Current Pharmacy:  [facility] [address] [place] [address] Phone: [phone] Fax: [phone]   Patient reports affordability concerns with their medications: Yes  - only when he has not met deductible.   Patient reports access/transportation concerns to their pharmacy: No  Patient reports adherence concerns with their medications:  No   - but refill history does show some inconsistencies in regular refills   Diabetes:  Current medications:  Lantus 16 units daily (takes around 6pm when he leaves for work); Trulicity 1.5mg  weekly (he is due a dose soon but does not have any at home)   metformin 850mg  twice a day and glipizide 10mg  daily.   CGM Documentation - this report is from 02/10/2024 to 02/23/2024  Days Worn: 14 days  % Time CGM is active: 79% (goal >=70%) Average Glucose:  143mg /dL (previously was 725 mg/dL) Glucose Management Indicator: 6.7% (previously was 7.6%) Glucose Variability: 38.0% (previously was 34.4%) (goal <36%) Time in Range:  - Time above range >250: 6% (previously was 14%) (typical goal: <5%) - Time above range 181-250: 14% (previously was 28%) (typical goal <20%) - Time in range 70-180: 79% (previously was 58%) (typical goal >=70%) - Time below range 54-69: 1% (typical goal <4%) - Time below range < 54: 0% (typical goal <1%)  Trends  Noted - still seeing postprandial blood glucose excursions. Noted 3 low blood glucose which occurred at work - all were short in duration.       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   Exercise - none currently, has an active job.    Hyperlipidemia/ASCVD Risk Reduction  Current lipid lowering medications:  atorvastatin 40mg  once a day and ezetimibe 10mg  daily  Antiplatelet regimen: aspirin 81mg  daily  Hypertension:  Current therapy: amlodipine 2.5mg  daily and losartan 100mg  daily  Does not check blood pressure at home.   Patient also reports he is taking potassium 10 mEq 3 times a day now. He states he needs new Rx with new directions but it looks like that was done 02/01/2024 for 90 day supply.    Objective:  Lab Results  Component Value Date   HGBA1C 8.6 (H) 02/08/2024    Lab Results  Component Value Date   CREATININE 1.20 02/08/2024   BUN 15 02/08/2024   NA 139 02/08/2024   K 3.5 02/08/2024   CL 99 02/08/2024   CO2 29 02/08/2024    Lab Results  Component Value Date   CHOL 86 02/08/2024   HDL 47.90 02/08/2024   LDLCALC 25 02/08/2024   TRIG 66.0 02/08/2024   CHOLHDL 2 02/08/2024    Medications Reviewed Today   Medications were not reviewed in this encounter      Assessment/Plan:  Medication Management:  -  reviewed each medication with patient.  - Reviewed medications.  - Called [facility] to verified potassium was filled 01/22/2024 fro 90 days. Coordinated with patient to check because there are likely 2 or 3 large bottles of potassium due to the quantity that was filled - he looked again and he did find a bottle of potassium - with directions take 3 times a day.   Diabetes:Currently uncontrolled  but improving.  - reviewed diet - limit intake of high CHO foods.  - Continue Lantus to 16 units daily. - Continue Trulicity to 1.5mg  weekly, glipizide  10mg  and metformin 850mg  - Requested refill from pharmacy for Trulicity - they will deliver to patient later this week.  - Sent in refill for Libre 3 plus sensor  Meds ordered this encounter  Medications   Continuous Glucose Sensor (FREESTYLE LIBRE 3 PLUS SENSOR) MISC    Sig: Change sensor every 15 days. Use to check blood glucose prior to meals and as needed throughout the day.    Dispense:  2 each    Refill:  2     Hyperlipidemia/ASCVD Risk Reduction - Continue atorvastatin 40mg  daily  - Continue ezetimibe 10mg  daily.     Follow Up Plan: follow up 4 to 6 weeks  Henrene Pastor, PharmD Clinical Pharmacist [facility]  Population Health [phone]

## 2024-02-28 ENCOUNTER — Other Ambulatory Visit (HOSPITAL_COMMUNITY): Payer: Self-pay

## 2024-02-28 ENCOUNTER — Other Ambulatory Visit: Payer: Self-pay

## 2024-03-01 ENCOUNTER — Ambulatory Visit (INDEPENDENT_AMBULATORY_CARE_PROVIDER_SITE_OTHER): Payer: BC Managed Care – PPO | Admitting: Family Medicine

## 2024-03-01 ENCOUNTER — Encounter: Payer: Self-pay | Admitting: Family Medicine

## 2024-03-01 ENCOUNTER — Other Ambulatory Visit: Payer: Self-pay

## 2024-03-01 ENCOUNTER — Other Ambulatory Visit (HOSPITAL_COMMUNITY): Payer: Self-pay

## 2024-03-01 VITALS — BP 138/80 | HR 76 | Temp 98.3°F | Ht 72.0 in | Wt 230.6 lb

## 2024-03-01 DIAGNOSIS — J309 Allergic rhinitis, unspecified: Secondary | ICD-10-CM

## 2024-03-01 DIAGNOSIS — E876 Hypokalemia: Secondary | ICD-10-CM | POA: Diagnosis not present

## 2024-03-01 DIAGNOSIS — E1165 Type 2 diabetes mellitus with hyperglycemia: Secondary | ICD-10-CM | POA: Diagnosis not present

## 2024-03-01 DIAGNOSIS — I10 Essential (primary) hypertension: Secondary | ICD-10-CM

## 2024-03-01 DIAGNOSIS — Z7984 Long term (current) use of oral hypoglycemic drugs: Secondary | ICD-10-CM

## 2024-03-01 DIAGNOSIS — Z1211 Encounter for screening for malignant neoplasm of colon: Secondary | ICD-10-CM

## 2024-03-01 DIAGNOSIS — Z794 Long term (current) use of insulin: Secondary | ICD-10-CM

## 2024-03-01 DIAGNOSIS — Z7985 Long-term (current) use of injectable non-insulin antidiabetic drugs: Secondary | ICD-10-CM

## 2024-03-01 LAB — BASIC METABOLIC PANEL
BUN: 19 mg/dL (ref 6–23)
CO2: 30 meq/L (ref 19–32)
Calcium: 9.2 mg/dL (ref 8.4–10.5)
Chloride: 94 meq/L — ABNORMAL LOW (ref 96–112)
Creatinine, Ser: 1.76 mg/dL — ABNORMAL HIGH (ref 0.40–1.50)
GFR: 42.11 mL/min — ABNORMAL LOW (ref >60.00)
Glucose, Bld: 306 mg/dL — ABNORMAL HIGH (ref 70–99)
Potassium: 3.2 meq/L — ABNORMAL LOW (ref 3.5–5.1)
Sodium: 135 meq/L (ref 135–145)

## 2024-03-01 MED ORDER — FLUTICASONE PROPIONATE 50 MCG/ACT NA SUSP
1.0000 | Freq: Every day | NASAL | 6 refills | Status: AC
  Filled 2024-03-01 (×3): qty 16, 30d supply, fill #0
  Filled 2024-03-22 – 2024-03-25 (×2): qty 16, 30d supply, fill #1

## 2024-03-01 NOTE — Progress Notes (Signed)
 Subjective:  Patient ID: Dylan Abbott, male    DOB: May 18, 1966  Age: 58 y.o. MRN: [id number]  CC:  Chief Complaint  Patient presents with   Medical Management of Chronic Issues    Pt is well notes no questions or concerns. Requesting refill zyrtec for seasonal allergies    Health Maintenance    Patient has declined Shingles, thinks he had 1 COVID-19 vaccine in 2024 will check [facility], would like a cologaurd rather than colonoscopy     HPI Dylan Abbott presents for   Diabetes: Complicated by hyperglycemia, CVD with prior suspected TIA and prior medication he has difficulties.  Had been referred to endocrinology previously, no-show for January appointment, he was not aware of that appointment. Last visit with me in February.  Lantus 15 units/day, Trulicity 1.5 mg weekly, metformin 850 mg twice daily, glipizide 10 mg daily. Has used CGM for monitoring.  Improved A1c last visit.  Sample of libre sensor provided last visit until determination of which CGM would be covered.  he is also followed by clinical pharmacist, visit 3/11.  Notes reviewed. Lantus 16 units/day, otherwise doses of meds as above.  CGM download from 2/22 through 02/23/2024, average glucose 143, 6% above 250, 14% 181-250, 1% 54-69, 0 below 54.  Still seeing postprandial glucose excursions, low blood sugars at work that were short in duration (we did discuss timing of meals, snacks at his last visit).  Diet instructions were reviewed, continued on same dose of Lantus, Trulicity, metformin and glipizide.  Libre 3 sensor refilled.  Has not filled sensor rx yet.  Still eating 2 meals per day. Some snacks if sensor running low only at work.   Microalbumin: 4.0, ratio 6.3 on 11/06/2023 Optho, foot exam, pneumovax:  Ophtho: Reports appointment this month. Pneumonia vaccine given last visit.  Lab Results  Component Value Date   HGBA1C 8.6 (H) 02/08/2024   HGBA1C 11.3 (H) 11/06/2023   HGBA1C 12.2 (H) 07/18/2023   HGBA1C  12.1 (H) 07/18/2023   Lab Results  Component Value Date   MICROALBUR 4.0 (H) 11/06/2023   LDLCALC 25 02/08/2024   CREATININE 1.20 02/08/2024    Hypertension: Amlodipine was discontinued at his February visit as he was taking nifedipine. Home readings: BP Readings from Last 3 Encounters:  03/01/24 138/80  02/08/24 122/80  12/07/23 (!) 144/88   Lab Results  Component Value Date   CREATININE 1.20 02/08/2024    Hypokalemia Improved on daily of potassium supplementation at his last visit. Lab Results  Component Value Date   NA 139 02/08/2024   K 3.5 02/08/2024   CL 99 02/08/2024   CO2 29 02/08/2024   Allergic rhinitis Flonase nasal spray for allergies. Effective in past. Needs refill, flare recently.    Health maintenance As above, has declined shingles vaccine. Screening options with colonoscopy versus Cologuard discussed.  Denies personal history of polyps or bleeding, no family history of colon cancer.  Discussed timing of repeat testing intervals if normal, as well as potential need for diagnostic Colonoscopy if positive Cologuard. Understanding expressed, and chose Cologuard.     History Patient Active Problem List   Diagnosis Date Noted   NSTEMI (non-ST elevated myocardial infarction) (HCC) 07/16/2023   Impingement syndrome of left shoulder 07/30/2020   COVID-19 virus infection 10/31/2019   Abnormal liver function 10/31/2019   Hypokalemia 10/31/2019   Hyponatremia 10/31/2019   Diabetes mellitus (HCC) 11/13/2017   Hyperlipidemia 11/13/2017   Hypertensive disorder 11/13/2017   Past Medical History:  Diagnosis  Date   Diabetes mellitus without complication (HCC)    Hyperlipidemia    Hypertension    Past Surgical History:  Procedure Laterality Date   LOOP RECORDER INSERTION N/A 07/18/2023   Procedure: LOOP RECORDER INSERTION;  Surgeon: Graciella Freer, PA-C;  Location: [facility];  Service: Cardiovascular;  Laterality: N/A;   No Known  Allergies Prior to Admission medications   Medication Sig Start Date End Date Taking? Authorizing Provider  amLODipine (NORVASC) 2.5 MG tablet Take 1 tablet (2.5 mg total) by mouth daily. 11/06/23  Yes Shade Flood, MD  aspirin EC 81 MG tablet Take 1 tablet (81 mg total) by mouth daily. 12/07/23  Yes Shade Flood, MD  atorvastatin (LIPITOR) 40 MG tablet Take 1 tablet (40 mg total) by mouth at bedtime. 08/03/23  Yes Shade Flood, MD  Continuous Glucose Sensor (FREESTYLE LIBRE 3 PLUS SENSOR) MISC Change sensor every 15 days. Use to check blood glucose prior to meals and as needed throughout the day. 02/27/24  Yes Shade Flood, MD  Dulaglutide (TRULICITY) 1.5 MG/0.5ML SOAJ Inject 1.5 mg into the skin every 7 (seven) days. 01/08/24  Yes Shade Flood, MD  ezetimibe (ZETIA) 10 MG tablet Take 1 tablet (10 mg total) by mouth daily. 09/13/23  Yes Shade Flood, MD  glipiZIDE (GLUCOTROL) 10 MG tablet Take 1 tablet (10 mg total) by mouth 2 (two) times daily before a meal. 02/01/24  Yes Shade Flood, MD  insulin glargine (LANTUS) 100 UNIT/ML Solostar Pen Inject 12 Units into the skin daily. Patient taking differently: Inject 16 Units into the skin daily. 09/13/23  Yes Shade Flood, MD  Insulin Pen Needle (UNIFINE PENTIPS) 32G X 4 MM MISC Use daily as directed 08/03/23  Yes Shade Flood, MD  losartan (COZAAR) 100 MG tablet Take 1 tablet (100 mg total) by mouth daily. 08/03/23  Yes Shade Flood, MD  metFORMIN (GLUCOPHAGE) 850 MG tablet Take 1 tablet (850 mg total) by mouth 2 (two) times daily with a meal. 02/01/24  Yes Shade Flood, MD  omeprazole (PRILOSEC) 20 MG capsule Take 1 capsule (20 mg total) by mouth daily. 10/20/23  Yes Shade Flood, MD  potassium chloride (KLOR-CON M) 10 MEQ tablet Take 3 tablets (30 mEq total) by mouth daily. 01/30/24  Yes Shade Flood, MD  triamcinolone cream (KENALOG) 0.1 % Apply 1 Application topically 2 (two) times daily.  09/27/23  Yes Shade Flood, MD   Social History   Socioeconomic History   Marital status: Married    Spouse name: Not on file   Number of children: 2   Years of education: Not on file   Highest education level: Not on file  Occupational History   Occupation: environmental  Tobacco Use   Smoking status: Never   Smokeless tobacco: Never  Substance and Sexual Activity   Alcohol use: Never    Alcohol/week: 0.0 standard drinks of alcohol   Drug use: Never   Sexual activity: Yes  Other Topics Concern   Not on file  Social History Narrative   Not on file   Social Drivers of Health   Financial Resource Strain: Not on file  Food Insecurity: No Food Insecurity (07/17/2023)   Hunger Vital Sign    Worried About Running Out of Food in the Last Year: Never true    Ran Out of Food in the Last Year: Never true  Transportation Needs: No Transportation Needs (07/17/2023)   PRAPARE -  Administrator, Civil Service (Medical): No    Lack of Transportation (Non-Medical): No  Physical Activity: Not on file  Stress: Not on file  Social Connections: Not on file  Intimate Partner Violence: Not At Risk (07/17/2023)   Humiliation, Afraid, Rape, and Kick questionnaire    Fear of Current or Ex-Partner: No    Emotionally Abused: No    Physically Abused: No    Sexually Abused: No    Review of Systems  Constitutional:  Negative for fatigue and unexpected weight change.  Eyes:  Negative for visual disturbance.  Respiratory:  Negative for cough, chest tightness and shortness of breath.   Cardiovascular:  Negative for chest pain, palpitations and leg swelling.  Gastrointestinal:  Negative for abdominal pain and blood in stool.  Neurological:  Negative for dizziness, light-headedness and headaches.     Objective:   Vitals:   03/01/24 1101  BP: 138/80  Pulse: 76  Temp: 98.3 F (36.8 C)  TempSrc: Temporal  SpO2: 96%  Weight: 230 lb 9.6 oz (104.6 kg)  Height: 6' (1.829 m)      Physical Exam Vitals reviewed.  Constitutional:      Appearance: He is well-developed.  HENT:     Head: Normocephalic and atraumatic.  Neck:     Vascular: No carotid bruit or JVD.  Cardiovascular:     Rate and Rhythm: Normal rate and regular rhythm.     Heart sounds: Normal heart sounds. No murmur heard. Pulmonary:     Effort: Pulmonary effort is normal.     Breath sounds: Normal breath sounds. No rales.  Musculoskeletal:     Right lower leg: No edema.     Left lower leg: No edema.  Skin:    General: Skin is warm and dry.  Neurological:     Mental Status: He is alert and oriented to person, place, and time.  Psychiatric:        Mood and Affect: Mood normal.        Assessment & Plan:  Dylan Abbott is a 58 y.o. male . Type 2 diabetes mellitus with hyperglycemia, without long-term current use of insulin (HCC) - Plan: Basic metabolic panel  -Improved A1c last month, still with some variability, room for improvement in diet.  Stressed importance again of snacking during his time at work his sets when he tends to drop low, healthy snacks and dietary discussion reviewed along with handout given.  Discussed concept of low glycemic index foods and understanding expressed.  Recheck in 2 months with repeat A1c.  Advised that the [place] monitor has been ordered and can check with his pharmacy if he has not received those.   Essential hypertension - Plan: Basic metabolic panel  -Stable on current regimen, check labs.  39-month follow-up  Hypokalemia - Plan: Basic metabolic panel  -Normal on last check, continue same dose of potassium and adjust plan accordingly based on labs today.  Screen for colon cancer - Plan: Cologuard Options for testing discussed and he chose Cologuard.  Potential risk of false positive and follow-up need for colonoscopy discussed.  Demonstration kit reviewed with patient by CMA.  Allergic rhinitis, unspecified seasonality, unspecified trigger - Plan:  fluticasone (FLONASE) 50 MCG/ACT nasal spray Recurrence of symptoms off meds, restart Flonase.  RTC precautions.  Meds ordered this encounter  Medications   fluticasone (FLONASE) 50 MCG/ACT nasal spray    Sig: Place 1-2 sprays into both nostrils daily.    Dispense:  16 g  Refill:  6   Patient Instructions  Contact [facility] pharmacy if you have not received your new sensors.  See info about diet for diabetes, healthy snack during your shift at work to keep you form running low.   Look at your meds at home. If you are taking both nifedipine and amlodipine, we will need to stop one of those meds. We stopped amlodipine last visit - let me know if your meds at home are different.     Diabetes Mellitus and Nutrition, Adult When you have diabetes, or diabetes mellitus, it is very important to have healthy eating habits because your blood sugar (glucose) levels are greatly affected by what you eat and drink. Eating healthy foods in the right amounts, at about the same times every day, can help you: Manage your blood glucose. Lower your risk of heart disease. Improve your blood pressure. Reach or maintain a healthy weight. What can affect my meal plan? Every person with diabetes is different, and each person has different needs for a meal plan. Your health care provider may recommend that you work with a dietitian to make a meal plan that is best for you. Your meal plan may vary depending on factors such as: The calories you need. The medicines you take. Your weight. Your blood glucose, blood pressure, and cholesterol levels. Your activity level. Other health conditions you have, such as heart or kidney disease. How do carbohydrates affect me? Carbohydrates, also called carbs, affect your blood glucose level more than any other type of food. Eating carbs raises the amount of glucose in your blood. It is important to know how many carbs you can safely have in each meal. This is  different for every person. Your dietitian can help you calculate how many carbs you should have at each meal and for each snack. How does alcohol affect me? Alcohol can cause a decrease in blood glucose (hypoglycemia), especially if you use insulin or take certain diabetes medicines by mouth. Hypoglycemia can be a life-threatening condition. Symptoms of hypoglycemia, such as sleepiness, dizziness, and confusion, are similar to symptoms of having too much alcohol. Do not drink alcohol if: Your health care provider tells you not to drink. You are pregnant, may be pregnant, or are planning to become pregnant. If you drink alcohol: Limit how much you have to: 0-1 drink a day for women. 0-2 drinks a day for men. Know how much alcohol is in your drink. In the [place] one drink equals one 12 oz bottle of beer (355 mL), one 5 oz glass of wine (148 mL), or one 1 oz glass of hard liquor (44 mL). Keep yourself hydrated with water, diet soda, or unsweetened iced tea. Keep in mind that regular soda, juice, and other mixers may contain a lot of sugar and must be counted as carbs. What are tips for following this plan?  Reading food labels Start by checking the serving size on the Nutrition Facts label of packaged foods and drinks. The number of calories and the amount of carbs, fats, and other nutrients listed on the label are based on one serving of the item. Many items contain more than one serving per package. Check the total grams (g) of carbs in one serving. Check the number of grams of saturated fats and trans fats in one serving. Choose foods that have a low amount or none of these fats. Check the number of milligrams (mg) of salt (sodium) in one serving. Most people should limit  total sodium intake to less than 2,300 mg per day. Always check the nutrition information of foods labeled as "low-fat" or "nonfat." These foods may be higher in added sugar or refined carbs and should be avoided. Talk to your  dietitian to identify your daily goals for nutrients listed on the label. Shopping Avoid buying canned, pre-made, or processed foods. These foods tend to be high in fat, sodium, and added sugar. Shop around the outside edge of the grocery store. This is where you will most often find fresh fruits and vegetables, bulk grains, fresh meats, and fresh dairy products. Cooking Use low-heat cooking methods, such as baking, instead of high-heat cooking methods, such as deep frying. Cook using healthy oils, such as olive, canola, or sunflower oil. Avoid cooking with butter, cream, or high-fat meats. Meal planning Eat meals and snacks regularly, preferably at the same times every day. Avoid going long periods of time without eating. Eat foods that are high in fiber, such as fresh fruits, vegetables, beans, and whole grains. Eat 4-6 oz (112-168 g) of lean protein each day, such as lean meat, chicken, fish, eggs, or tofu. One ounce (oz) (28 g) of lean protein is equal to: 1 oz (28 g) of meat, chicken, or fish. 1 egg.  cup (62 g) of tofu. Eat some foods each day that contain healthy fats, such as avocado, nuts, seeds, and fish. What foods should I eat? Fruits Berries. Apples. Oranges. Peaches. Apricots. Plums. Grapes. Mangoes. Papayas. Pomegranates. Kiwi. Cherries. Vegetables Leafy greens, including lettuce, spinach, kale, chard, collard greens, mustard greens, and cabbage. Beets. Cauliflower. Broccoli. Carrots. Green beans. Tomatoes. Peppers. Onions. Cucumbers. Brussels sprouts. Grains Whole grains, such as whole-wheat or whole-grain bread, crackers, tortillas, cereal, and pasta. Unsweetened oatmeal. Quinoa. Brown or wild rice. Meats and other proteins Seafood. Poultry without skin. Lean cuts of poultry and beef. Tofu. Nuts. Seeds. Dairy Low-fat or fat-free dairy products such as milk, yogurt, and cheese. The items listed above may not be a complete list of foods and beverages you can eat and drink.  Contact a dietitian for more information. What foods should I avoid? Fruits Fruits canned with syrup. Vegetables Canned vegetables. Frozen vegetables with butter or cream sauce. Grains Refined white flour and flour products such as bread, pasta, snack foods, and cereals. Avoid all processed foods. Meats and other proteins Fatty cuts of meat. Poultry with skin. Breaded or fried meats. Processed meat. Avoid saturated fats. Dairy Full-fat yogurt, cheese, or milk. Beverages Sweetened drinks, such as soda or iced tea. The items listed above may not be a complete list of foods and beverages you should avoid. Contact a dietitian for more information. Questions to ask a health care provider Do I need to meet with a certified diabetes care and education specialist? Do I need to meet with a dietitian? What number can I call if I have questions? When are the best times to check my blood glucose? Where to find more information: American Diabetes Association: diabetes.org Academy of Nutrition and Dietetics: eatright.[employer] of Diabetes and Digestive and Kidney Diseases: StageSync.si Association of Diabetes Care & Education Specialists: diabeteseducator.org Summary It is important to have healthy eating habits because your blood sugar (glucose) levels are greatly affected by what you eat and drink. It is important to use alcohol carefully. A healthy meal plan will help you manage your blood glucose and lower your risk of heart disease. Your health care provider may recommend that you work with a dietitian to make a  meal plan that is best for you. This information is not intended to replace advice given to you by your health care provider. Make sure you discuss any questions you have with your health care provider. Document Revised: 07/07/2020 Document Reviewed: 07/08/2020 Elsevier Patient Education  2024 Elsevier Inc.         Signed,   Meredith Staggers, MD [facility], [facility] 03/01/24 11:50 AM

## 2024-03-01 NOTE — Patient Instructions (Addendum)
 Contact [facility] if you have not received your new sensors.  See info about diet for diabetes, healthy snack during your shift at work to keep you form running low.   Look at your meds at home. If you are taking both nifedipine and amlodipine, we will need to stop one of those meds. We stopped amlodipine last visit - let me know if your meds at home are different.     Diabetes Mellitus and Nutrition, Adult When you have diabetes, or diabetes mellitus, it is very important to have healthy eating habits because your blood sugar (glucose) levels are greatly affected by what you eat and drink. Eating healthy foods in the right amounts, at about the same times every day, can help you: Manage your blood glucose. Lower your risk of heart disease. Improve your blood pressure. Reach or maintain a healthy weight. What can affect my meal plan? Every person with diabetes is different, and each person has different needs for a meal plan. Your health care provider may recommend that you work with a dietitian to make a meal plan that is best for you. Your meal plan may vary depending on factors such as: The calories you need. The medicines you take. Your weight. Your blood glucose, blood pressure, and cholesterol levels. Your activity level. Other health conditions you have, such as heart or kidney disease. How do carbohydrates affect me? Carbohydrates, also called carbs, affect your blood glucose level more than any other type of food. Eating carbs raises the amount of glucose in your blood. It is important to know how many carbs you can safely have in each meal. This is different for every person. Your dietitian can help you calculate how many carbs you should have at each meal and for each snack. How does alcohol affect me? Alcohol can cause a decrease in blood glucose (hypoglycemia), especially if you use insulin or take certain diabetes medicines by mouth. Hypoglycemia can be a  life-threatening condition. Symptoms of hypoglycemia, such as sleepiness, dizziness, and confusion, are similar to symptoms of having too much alcohol. Do not drink alcohol if: Your health care provider tells you not to drink. You are pregnant, may be pregnant, or are planning to become pregnant. If you drink alcohol: Limit how much you have to: 0-1 drink a day for women. 0-2 drinks a day for men. Know how much alcohol is in your drink. In the [place] one drink equals one 12 oz bottle of beer (355 mL), one 5 oz glass of wine (148 mL), or one 1 oz glass of hard liquor (44 mL). Keep yourself hydrated with water, diet soda, or unsweetened iced tea. Keep in mind that regular soda, juice, and other mixers may contain a lot of sugar and must be counted as carbs. What are tips for following this plan?  Reading food labels Start by checking the serving size on the Nutrition Facts label of packaged foods and drinks. The number of calories and the amount of carbs, fats, and other nutrients listed on the label are based on one serving of the item. Many items contain more than one serving per package. Check the total grams (g) of carbs in one serving. Check the number of grams of saturated fats and trans fats in one serving. Choose foods that have a low amount or none of these fats. Check the number of milligrams (mg) of salt (sodium) in one serving. Most people should limit total sodium intake to less than 2,300 mg  per day. Always check the nutrition information of foods labeled as "low-fat" or "nonfat." These foods may be higher in added sugar or refined carbs and should be avoided. Talk to your dietitian to identify your daily goals for nutrients listed on the label. Shopping Avoid buying canned, pre-made, or processed foods. These foods tend to be high in fat, sodium, and added sugar. Shop around the outside edge of the grocery store. This is where you will most often find fresh fruits and vegetables,  bulk grains, fresh meats, and fresh dairy products. Cooking Use low-heat cooking methods, such as baking, instead of high-heat cooking methods, such as deep frying. Cook using healthy oils, such as olive, canola, or sunflower oil. Avoid cooking with butter, cream, or high-fat meats. Meal planning Eat meals and snacks regularly, preferably at the same times every day. Avoid going long periods of time without eating. Eat foods that are high in fiber, such as fresh fruits, vegetables, beans, and whole grains. Eat 4-6 oz (112-168 g) of lean protein each day, such as lean meat, chicken, fish, eggs, or tofu. One ounce (oz) (28 g) of lean protein is equal to: 1 oz (28 g) of meat, chicken, or fish. 1 egg.  cup (62 g) of tofu. Eat some foods each day that contain healthy fats, such as avocado, nuts, seeds, and fish. What foods should I eat? Fruits Berries. Apples. Oranges. Peaches. Apricots. Plums. Grapes. Mangoes. Papayas. Pomegranates. Kiwi. Cherries. Vegetables Leafy greens, including lettuce, spinach, kale, chard, collard greens, mustard greens, and cabbage. Beets. Cauliflower. Broccoli. Carrots. Green beans. Tomatoes. Peppers. Onions. Cucumbers. Brussels sprouts. Grains Whole grains, such as whole-wheat or whole-grain bread, crackers, tortillas, cereal, and pasta. Unsweetened oatmeal. Quinoa. Brown or wild rice. Meats and other proteins Seafood. Poultry without skin. Lean cuts of poultry and beef. Tofu. Nuts. Seeds. Dairy Low-fat or fat-free dairy products such as milk, yogurt, and cheese. The items listed above may not be a complete list of foods and beverages you can eat and drink. Contact a dietitian for more information. What foods should I avoid? Fruits Fruits canned with syrup. Vegetables Canned vegetables. Frozen vegetables with butter or cream sauce. Grains Refined white flour and flour products such as bread, pasta, snack foods, and cereals. Avoid all processed foods. Meats and  other proteins Fatty cuts of meat. Poultry with skin. Breaded or fried meats. Processed meat. Avoid saturated fats. Dairy Full-fat yogurt, cheese, or milk. Beverages Sweetened drinks, such as soda or iced tea. The items listed above may not be a complete list of foods and beverages you should avoid. Contact a dietitian for more information. Questions to ask a health care provider Do I need to meet with a certified diabetes care and education specialist? Do I need to meet with a dietitian? What number can I call if I have questions? When are the best times to check my blood glucose? Where to find more information: American Diabetes Association: diabetes.org Academy of Nutrition and Dietetics: eatright.[employer] of Diabetes and Digestive and Kidney Diseases: StageSync.si Association of Diabetes Care & Education Specialists: diabeteseducator.org Summary It is important to have healthy eating habits because your blood sugar (glucose) levels are greatly affected by what you eat and drink. It is important to use alcohol carefully. A healthy meal plan will help you manage your blood glucose and lower your risk of heart disease. Your health care provider may recommend that you work with a dietitian to make a meal plan that is best for you. This  information is not intended to replace advice given to you by your health care provider. Make sure you discuss any questions you have with your health care provider. Document Revised: 07/07/2020 Document Reviewed: 07/08/2020 Elsevier Patient Education  2024 [employer].

## 2024-03-06 ENCOUNTER — Telehealth: Payer: Self-pay

## 2024-03-06 NOTE — Telephone Encounter (Signed)
 Called patient to go over lab results and to make sure appointments are set up, Left Vm to return call

## 2024-03-06 NOTE — Telephone Encounter (Signed)
 Left vm to call office about lab results

## 2024-03-06 NOTE — Telephone Encounter (Signed)
 Call patient.  Potassium was low again.  Blood sugar elevated, kidney function test elevated.  Have him see me later this week if possible with his home meds and we can change potassium dosing based on what he is taking at home and recheck kidney function test.  Make sure he is not taking any NSAIDs and drinking sufficient fluids.  Additionally routed to admin pool to schedule visit next few days.

## 2024-03-07 ENCOUNTER — Encounter: Payer: Self-pay | Admitting: Family Medicine

## 2024-03-07 NOTE — Telephone Encounter (Signed)
 Reached out to pt, no answer, sending letter to contact the office

## 2024-03-13 ENCOUNTER — Encounter: Payer: Self-pay | Admitting: Family Medicine

## 2024-03-14 ENCOUNTER — Telehealth: Payer: Self-pay | Admitting: Pharmacist

## 2024-03-14 ENCOUNTER — Other Ambulatory Visit: Payer: Self-pay | Admitting: Pharmacist

## 2024-03-15 ENCOUNTER — Other Ambulatory Visit: Admitting: Pharmacist

## 2024-03-15 ENCOUNTER — Other Ambulatory Visit: Payer: Self-pay | Admitting: Pharmacist

## 2024-03-15 ENCOUNTER — Telehealth: Payer: Self-pay | Admitting: Pharmacist

## 2024-03-15 NOTE — Progress Notes (Signed)
 Attempt was made to contact patient by phone today for follow up by Clinical Pharmacist regarding follow up diabetes and CGM.  Unable to reach patient. LM on VM with my contact number [phone].

## 2024-03-18 ENCOUNTER — Ambulatory Visit (INDEPENDENT_AMBULATORY_CARE_PROVIDER_SITE_OTHER): Payer: 59

## 2024-03-18 DIAGNOSIS — I639 Cerebral infarction, unspecified: Secondary | ICD-10-CM

## 2024-03-18 LAB — CUP PACEART REMOTE DEVICE CHECK
Date Time Interrogation Session: 20250330231835
Implantable Pulse Generator Implant Date: 20240730

## 2024-03-20 NOTE — Progress Notes (Signed)
 Carelink Summary Report / Loop Recorder

## 2024-03-20 NOTE — Addendum Note (Signed)
 Addended by: Geralyn Flash D on: 03/20/2024 10:38 AM   Modules accepted: Orders

## 2024-03-22 ENCOUNTER — Other Ambulatory Visit: Payer: Self-pay | Admitting: Pharmacist

## 2024-03-22 ENCOUNTER — Encounter: Payer: Self-pay | Admitting: Pharmacist

## 2024-03-22 ENCOUNTER — Other Ambulatory Visit (HOSPITAL_COMMUNITY): Payer: Self-pay

## 2024-03-22 MED ORDER — ATORVASTATIN CALCIUM 40 MG PO TABS
40.0000 mg | ORAL_TABLET | Freq: Every day | ORAL | 0 refills | Status: DC
  Filled 2024-03-22: qty 90, 90d supply, fill #0

## 2024-03-22 MED ORDER — TRULICITY 1.5 MG/0.5ML ~~LOC~~ SOAJ
1.5000 mg | SUBCUTANEOUS | 1 refills | Status: DC
  Filled 2024-03-22: qty 2, 28d supply, fill #0
  Filled 2024-05-17: qty 2, 28d supply, fill #1

## 2024-03-22 MED ORDER — LOSARTAN POTASSIUM 100 MG PO TABS
100.0000 mg | ORAL_TABLET | Freq: Every day | ORAL | 0 refills | Status: DC
  Filled 2024-03-22: qty 90, 90d supply, fill #0

## 2024-03-22 NOTE — Progress Notes (Signed)
 03/26/2024 Name: Dylan Abbott MRN: [id number] DOB: 12-18-1966  Chief Complaint  Patient presents with   Diabetes   Medication Management    Dylan Abbott is a 58 y.o. year old male who presented for a phone visit with the  Clinical Pharmacist today    They were referred to the pharmacist by their PCP for assistance in managing diabetes, medication access, and complex medication management.    Subjective:  Medication Access/Adherence  Patient was using Libre 3 Plus Continuous Glucose Monitor but he has run out of sensors. We need to get prior authorization but I need information about his prescription insurance. I have not been able to see any updated insurance card information but patient states he brought card in to be scanned at last visit.  He provided me some information from the card at home - [employer]BS / ID: [id number] / PPO network provider info line [phone] but the card he was reading from had no Rx information.  Called [employer] and they were able to provide a number [phone] for [facility] / [place] Rx services   Current Pharmacy:  [facility] [address] [place] [address] Phone: [phone] Fax: [phone]   Patient reports affordability concerns with their medications: Yes  - only when he has not met deductible.   Patient reports access/transportation concerns to their pharmacy: No  Patient reports adherence concerns with their medications:  No    Diabetes:  Current medications:  Lantus 16 units daily (takes around 6pm when he leaves for work); Trulicity 1.5mg  weekly (he is due a dose soon but does not have any at home)  metformin 850mg  twice a day and glipizide 10mg  daily.   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   Exercise - none currently, has an active job.    Hyperlipidemia/ASCVD Risk  Reduction  Current lipid lowering medications:  atorvastatin 40mg  once a day and ezetimibe 10mg  daily  Antiplatelet regimen: aspirin 81mg  daily  Hypertension:  Current therapy: nifedipine ER 90mg  daily  and losartan 100mg  daily  (his med list had amlodipine but per PCP notes - Dr Neva Seat recommended he could take EITHER nifedipine OR amlodipine.  Does not check blood pressure at home.   Patient also reports he is taking potassium 10 mEq 3 times a day now. His last BMet showed low potassium. Dr Neva Seat had noted to verify dose and wanted patient to schedule an appointment and recheck labs.    Objective:  Lab Results  Component Value Date   HGBA1C 8.6 (H) 02/08/2024    Lab Results  Component Value Date   CREATININE 1.76 (H) 03/01/2024   BUN 19 03/01/2024   NA 135 03/01/2024   K 3.2 (L) 03/01/2024   CL 94 (L) 03/01/2024   CO2 30 03/01/2024    Lab Results  Component Value Date   CHOL 86 02/08/2024   HDL 47.90 02/08/2024   LDLCALC 25 02/08/2024   TRIG 66.0 02/08/2024   CHOLHDL 2 02/08/2024    Medications Reviewed Today     Reviewed by Henrene Pastor, RPH-CPP (Pharmacist) on 03/22/24 at 1538  Med List Status: <None>   Medication Order Taking? Sig Documenting Provider Last Dose Status Informant  aspirin EC 81 MG tablet [id number] Yes Take 1 tablet (81 mg total) by mouth daily. Shade Flood, MD Taking Active  atorvastatin (LIPITOR) 40 MG tablet [id number] Yes Take 1 tablet (40 mg total) by mouth at bedtime. Shade Flood, MD Taking Active   Continuous Glucose Sensor (FREESTYLE LIBRE 3 PLUS SENSOR) [place] [id number] No Change sensor every 15 days. Use to check blood glucose prior to meals and as needed throughout the day.  Patient not taking: Reported on 03/22/2024   Shade Flood, MD Not Taking Active   Dulaglutide (TRULICITY) 1.5 MG/0.5ML Ivory Broad [id number] Yes Inject 1.5 mg into the skin every 7 (seven) days. Shade Flood, MD Taking Active            Med Note  [facility]   Tue Jan 30, 2024  1:22 PM)    ezetimibe (ZETIA) 10 MG tablet [id number] Yes Take 1 tablet (10 mg total) by mouth daily. Shade Flood, MD Taking Active   fluticasone [facility]) 50 MCG/ACT nasal spray [id number] Yes Place 1-2 sprays into both nostrils daily. Shade Flood, MD Taking Active   glipiZIDE (GLUCOTROL) 10 MG tablet [id number] Yes Take 1 tablet (10 mg total) by mouth 2 (two) times daily before a meal. Shade Flood, MD Taking Active   insulin glargine (LANTUS) 100 UNIT/ML Solostar Pen [id number] Yes Inject 12 Units into the skin daily.  Patient taking differently: Inject 16 Units into the skin daily.   Shade Flood, MD Taking Active   Insulin Pen Needle (UNIFINE PENTIPS) 32G X 4 MM MISC [id number] Yes Use daily as directed Shade Flood, MD Taking Active   losartan (COZAAR) 100 MG tablet [id number] Yes Take 1 tablet (100 mg total) by mouth daily. Shade Flood, MD Taking Active   metFORMIN (GLUCOPHAGE) 850 MG tablet [id number] Yes Take 1 tablet (850 mg total) by mouth 2 (two) times daily with a meal. Shade Flood, MD Taking Active   NIFEdipine (PROCARDIA XL/NIFEDICAL-XL) 90 MG 24 hr tablet [id number] Yes Take 90 mg by mouth daily. [provider] Taking Active   potassium chloride (KLOR-CON M) 10 MEQ tablet [id number] Yes Take 3 tablets (30 mEq total) by mouth daily. Shade Flood, MD Taking Active              Assessment/Plan:  Medication Management:  - reviewed each medication with patient.  - Reviewed medications.  - Called [facility] to request the following refills with patient approval. Trulicity, atorvastatin, losartan  Hypokalemia:  - Instructed patient to take potassium 4 tabs daily instead of 3 tabs.  - Made appointment for patient to follow up with Dr Neva Seat 03/29/2024.  Diabetes:Currently uncontrolled  but improving.  - reviewed diet - limit intake of high CHO foods.  - Continue Lantus to 16 units  daily. - Continue Trulicity to 1.5mg  weekly, glipizide 10mg  and metformin 850mg  - Requested refill from pharmacy for Trulicity - they will deliver to patient later this week.  - Contacted [employer] prior authorization department to start an appeal for [place] 3 plus sensors. Appeal Reference # [id number]   Meds ordered this encounter  Medications   atorvastatin (LIPITOR) 40 MG tablet    Sig: Take 1 tablet (40 mg total) by mouth at bedtime.    Dispense:  90 tablet    Refill:  0   Dulaglutide (TRULICITY) 1.5 MG/0.5ML SOAJ    Sig: Inject 1.5 mg into the skin every 7 (seven) days.    Dispense:  2 mL    Refill:  1    Dose escalation   losartan (COZAAR) 100 MG tablet  Sig: Take 1 tablet (100 mg total) by mouth daily.    Dispense:  90 tablet    Refill:  0     Hyperlipidemia/ASCVD Risk Reduction - Continue atorvastatin 40mg  daily  - Continue ezetimibe 10mg  daily.     Follow Up Plan:appt with PCP 03/29/2024 - plan to be in office to help with Continuous Glucose Sensors.   Henrene Pastor, PharmD Clinical Pharmacist [facility]  Population Health [phone]

## 2024-03-22 NOTE — Progress Notes (Signed)
 Opened in error

## 2024-03-25 ENCOUNTER — Other Ambulatory Visit: Payer: Self-pay

## 2024-03-25 ENCOUNTER — Other Ambulatory Visit (HOSPITAL_COMMUNITY): Payer: Self-pay

## 2024-03-29 ENCOUNTER — Ambulatory Visit: Admitting: Family Medicine

## 2024-03-29 ENCOUNTER — Encounter: Payer: Self-pay | Admitting: Family Medicine

## 2024-03-29 ENCOUNTER — Other Ambulatory Visit (HOSPITAL_COMMUNITY): Payer: Self-pay

## 2024-03-29 VITALS — BP 138/78 | HR 71 | Temp 98.5°F | Ht 72.0 in | Wt 231.6 lb

## 2024-03-29 DIAGNOSIS — Z7984 Long term (current) use of oral hypoglycemic drugs: Secondary | ICD-10-CM

## 2024-03-29 DIAGNOSIS — E1165 Type 2 diabetes mellitus with hyperglycemia: Secondary | ICD-10-CM | POA: Diagnosis not present

## 2024-03-29 DIAGNOSIS — E876 Hypokalemia: Secondary | ICD-10-CM | POA: Diagnosis not present

## 2024-03-29 DIAGNOSIS — R7989 Other specified abnormal findings of blood chemistry: Secondary | ICD-10-CM | POA: Diagnosis not present

## 2024-03-29 DIAGNOSIS — I10 Essential (primary) hypertension: Secondary | ICD-10-CM

## 2024-03-29 LAB — BASIC METABOLIC PANEL WITH GFR
BUN: 24 mg/dL — ABNORMAL HIGH (ref 6–23)
CO2: 33 meq/L — ABNORMAL HIGH (ref 19–32)
Calcium: 9.6 mg/dL (ref 8.4–10.5)
Chloride: 96 meq/L (ref 96–112)
Creatinine, Ser: 1.52 mg/dL — ABNORMAL HIGH (ref 0.40–1.50)
GFR: 50.18 mL/min — ABNORMAL LOW (ref >60.00)
Glucose, Bld: 163 mg/dL — ABNORMAL HIGH (ref 70–99)
Potassium: 3.5 meq/L (ref 3.5–5.1)
Sodium: 138 meq/L (ref 135–145)

## 2024-03-29 MED ORDER — POTASSIUM CHLORIDE CRYS ER 10 MEQ PO TBCR
20.0000 meq | EXTENDED_RELEASE_TABLET | Freq: Two times a day (BID) | ORAL | 1 refills | Status: DC
  Filled 2024-03-29 – 2024-05-17 (×2): qty 120, 30d supply, fill #0
  Filled 2024-06-11: qty 120, 30d supply, fill #1

## 2024-03-29 NOTE — Progress Notes (Signed)
 Subjective:  Patient ID: Dylan Abbott, male    DOB: Jul 04, 1966  Age: 58 y.o. MRN: [id number]  CC:  Chief Complaint  Patient presents with   Results    Discuss lab results, notes no questions about labs or other concerns     HPI Dylan Abbott presents for   Diabetes: Complicated by hyperglycemia, CVD with prior suspected TIA.  Last visit March 14.  Most recent A1c had improved at 8.6.  Followed by clinical pharmacist as well.  Appreciate assistance.  As of last visit was taking Lantus 16 units/day, Trulicity 1.5 mg weekly, metformin 850 mg twice daily and glipizide 10 mg BID.  CGM for monitoring.  Dietary advice has been discussed including regular meals and healthy snacks to minimize hypoglycemia.   Lab Results  Component Value Date   HGBA1C 8.6 (H) 02/08/2024   HGBA1C 11.3 (H) 11/06/2023   HGBA1C 12.2 (H) 07/18/2023   HGBA1C 12.1 (H) 07/18/2023   Lab Results  Component Value Date   MICROALBUR 4.0 (H) 11/06/2023   LDLCALC 25 02/08/2024   CREATININE 1.76 (H) 03/01/2024   Hypokalemia Dosage of potassium has been adjusted.  30 mill equivalents daily as of his March 14 visit.  Low reading at that time.  Some question regarding his dosage of potassium at home, 1 week follow-up with him as recommended on his March 18 lab visit. Lab Results  Component Value Date   K 3.2 (L) 03/01/2024   Elevated creatinine Previous range 1.11-1.20 previous range of 1.03-1.35.  Elevated reading at his March 14 visit of 1.76.  Plan for repeat testing, avoidance of NSAIDs and hydration recommended.  There was some question about his antihypertensive regimen, as he appeared to have been on both nifedipine and amlodipine, but when meds were reviewed in February advised him to discontinue amlodipine as he was taking nifedipine at that time.   Visit with clinical pharmacist noted on April 4.  Meds verified at that time with nifedipine ER 90 mg daily, losartan 100 mg daily for hypertension.  Potassium  10 mEq 3 times per day, dosage was increased to 4 tabs per day at his April 4 visit.  Plan for appeal for libre 3+ sensors.  Refilled Trulicity.  Still working on [place] - has not received yet.  Only taking 3 potassium pills per day - did not know about change on 4/4. No palpitations. Feels well, no new c/o.  No nsaids.  Drinking water during day - a lot of water unknown amount.   Meds reviewed in office - nifedipine, losartan for BP - denies missed doses.  .History Patient Active Problem List   Diagnosis Date Noted   NSTEMI (non-ST elevated myocardial infarction) (HCC) 07/16/2023   Impingement syndrome of left shoulder 07/30/2020   COVID-19 virus infection 10/31/2019   Abnormal liver function 10/31/2019   Hypokalemia 10/31/2019   Hyponatremia 10/31/2019   Diabetes mellitus (HCC) 11/13/2017   Hyperlipidemia 11/13/2017   Hypertensive disorder 11/13/2017   Past Medical History:  Diagnosis Date   Diabetes mellitus without complication (HCC)    Hyperlipidemia    Hypertension    Past Surgical History:  Procedure Laterality Date   LOOP RECORDER INSERTION N/A 07/18/2023   Procedure: LOOP RECORDER INSERTION;  Surgeon: Graciella Freer, PA-C;  Location: [facility];  Service: Cardiovascular;  Laterality: N/A;   No Known Allergies Prior to Admission medications   Medication Sig Start Date End Date Taking? Authorizing Provider  aspirin EC 81 MG tablet Take 1  tablet (81 mg total) by mouth daily. 12/07/23  Yes Shade Flood, MD  atorvastatin (LIPITOR) 40 MG tablet Take 1 tablet (40 mg total) by mouth at bedtime. 03/22/24  Yes Shade Flood, MD  Dulaglutide (TRULICITY) 1.5 MG/0.5ML SOAJ Inject 1.5 mg into the skin every 7 (seven) days. 03/22/24  Yes Shade Flood, MD  ezetimibe (ZETIA) 10 MG tablet Take 1 tablet (10 mg total) by mouth daily. 09/13/23  Yes Shade Flood, MD  fluticasone (FLONASE) 50 MCG/ACT nasal spray Place 1-2 sprays into both nostrils daily. 03/01/24   Yes Shade Flood, MD  glipiZIDE (GLUCOTROL) 10 MG tablet Take 1 tablet (10 mg total) by mouth 2 (two) times daily before a meal. 02/01/24  Yes Shade Flood, MD  insulin glargine (LANTUS) 100 UNIT/ML Solostar Pen Inject 12 Units into the skin daily. Patient taking differently: Inject 16 Units into the skin daily. 09/13/23  Yes Shade Flood, MD  Insulin Pen Needle (UNIFINE PENTIPS) 32G X 4 MM MISC Use daily as directed 08/03/23  Yes Shade Flood, MD  losartan (COZAAR) 100 MG tablet Take 1 tablet (100 mg total) by mouth daily. 03/22/24  Yes Shade Flood, MD  metFORMIN (GLUCOPHAGE) 850 MG tablet Take 1 tablet (850 mg total) by mouth 2 (two) times daily with a meal. 02/01/24  Yes Shade Flood, MD  NIFEdipine (PROCARDIA XL/NIFEDICAL-XL) 90 MG 24 hr tablet Take 90 mg by mouth daily.   Yes [provider]  potassium chloride (KLOR-CON M) 10 MEQ tablet Take 3 tablets (30 mEq total) by mouth daily. 01/30/24  Yes Shade Flood, MD  Continuous Glucose Sensor (FREESTYLE LIBRE 3 PLUS SENSOR) MISC Change sensor every 15 days. Use to check blood glucose prior to meals and as needed throughout the day. Patient not taking: Reported on 03/29/2024 02/27/24   Shade Flood, MD   Social History   Socioeconomic History   Marital status: Married    Spouse name: Not on file   Number of children: 2   Years of education: Not on file   Highest education level: Not on file  Occupational History   Occupation: environmental  Tobacco Use   Smoking status: Never   Smokeless tobacco: Never  Substance and Sexual Activity   Alcohol use: Never    Alcohol/week: 0.0 standard drinks of alcohol   Drug use: Never   Sexual activity: Yes  Other Topics Concern   Not on file  Social History Narrative   Not on file   Social Drivers of Health   Financial Resource Strain: Not on file  Food Insecurity: No Food Insecurity (07/17/2023)   Hunger Vital Sign    Worried About Running Out of  Food in the Last Year: Never true    Ran Out of Food in the Last Year: Never true  Transportation Needs: No Transportation Needs (07/17/2023)   PRAPARE - Administrator, Civil Service (Medical): No    Lack of Transportation (Non-Medical): No  Physical Activity: Not on file  Stress: Not on file  Social Connections: Not on file  Intimate Partner Violence: Not At Risk (07/17/2023)   Humiliation, Afraid, Rape, and Kick questionnaire    Fear of Current or Ex-Partner: No    Emotionally Abused: No    Physically Abused: No    Sexually Abused: No    Review of Systems  Constitutional:  Negative for fatigue and unexpected weight change.  Eyes:  Negative for visual disturbance.  Respiratory:  Negative for cough, chest tightness and shortness of breath.   Cardiovascular:  Negative for chest pain, palpitations and leg swelling.  Gastrointestinal:  Negative for abdominal pain and blood in stool.  Neurological:  Negative for dizziness, light-headedness and headaches.     Objective:   Vitals:   03/29/24 0952 03/29/24 1007  BP: (!) 154/80 138/78  Pulse: 71   Temp: 98.5 F (36.9 C)   TempSrc: Temporal   SpO2: 99%   Weight: 231 lb 9.6 oz (105.1 kg)   Height: 6' (1.829 m)      Physical Exam Vitals reviewed.  Constitutional:      Appearance: He is well-developed.  HENT:     Head: Normocephalic and atraumatic.  Neck:     Vascular: No carotid bruit or JVD.  Cardiovascular:     Rate and Rhythm: Normal rate and regular rhythm.     Heart sounds: Normal heart sounds. No murmur heard. Pulmonary:     Effort: Pulmonary effort is normal.     Breath sounds: Normal breath sounds. No rales.  Musculoskeletal:     Right lower leg: No edema.     Left lower leg: No edema.  Skin:    General: Skin is warm and dry.  Neurological:     Mental Status: He is alert and oriented to person, place, and time.  Psychiatric:        Mood and Affect: Mood normal.        Assessment & Plan:   Paula Zietz is a 58 y.o. male . Hypokalemia - Plan: Basic metabolic panel with GFR, potassium chloride (KLOR-CON M) 10 MEQ tablet  - Unfortunately has remained at twice daily dosing of potassium, has not increased as recommended on the fourth.  We did discuss that change today, understanding was verified and I did write the new dosing on his current bottle of potassium as well as prescribed new supply.  Check BMP and adjust plan accordingly.  Asymptomatic at this time, ER precautions given if symptomatic hypokalemia.  Elevated serum creatinine - Plan: Basic metabolic panel with GFR  - Deviation from prior baseline on last lab work.  Denies any NSAIDs, or change in hydration.  Repeat labs today and adjust plan accordingly.  Type 2 diabetes mellitus with hyperglycemia, without long-term current use of insulin (HCC) - Tolerating current med regimen, unknown home glycemic control at this time, prior authorization in progress for [place] 3 sensor.  Keep follow-up as planned with me next month.  No med changes for now.  Essential hypertension  - Verified home meds, he is not taking amlodipine.  Repeat blood pressure reasonable today.  No med changes for now.  Meds ordered this encounter  Medications   potassium chloride (KLOR-CON M) 10 MEQ tablet    Sig: Take 2 tablets (20 mEq total) by mouth 2 (two) times daily.    Dispense:  120 tablet    Refill:  1   Patient Instructions  Thanks for coming in today.  Increase the potassium to 2 pills in the morning, 2 pills at night for now.  I did send a new prescription to your pharmacy.  I will recheck your electrolytes including the potassium and kidney function test today but no other med changes at this time.  Recheck next month and we can repeat the blood sugar test at that time.  Let me know if there are any questions before that visit and be seen if any new symptoms.  Take care!  Signed,   Meredith Staggers, MD [facility], [facility] 03/29/24 11:08 AM

## 2024-03-29 NOTE — Patient Instructions (Signed)
 Thanks for coming in today.  Increase the potassium to 2 pills in the morning, 2 pills at night for now.  I did send a new prescription to your pharmacy.  I will recheck your electrolytes including the potassium and kidney function test today but no other med changes at this time.  Recheck next month and we can repeat the blood sugar test at that time.  Let me know if there are any questions before that visit and be seen if any new symptoms.  Take care!

## 2024-04-17 LAB — COLOGUARD: COLOGUARD: NEGATIVE

## 2024-04-18 ENCOUNTER — Telehealth: Payer: Self-pay

## 2024-04-18 NOTE — Telephone Encounter (Signed)
 Cologuard results  have been discussed.   Verbalized understanding? Yes  Are there any questions? No

## 2024-04-18 NOTE — Telephone Encounter (Signed)
-----   Message from Benjiman Bras sent at 04/17/2024  6:05 PM EDT ----- Good news.  Cologuard colon cancer screening test was negative or normal.  This test should be repeated in 3 years.  Please let me know if he has questions.

## 2024-04-22 ENCOUNTER — Ambulatory Visit (INDEPENDENT_AMBULATORY_CARE_PROVIDER_SITE_OTHER): Payer: 59

## 2024-04-22 DIAGNOSIS — I639 Cerebral infarction, unspecified: Secondary | ICD-10-CM | POA: Diagnosis not present

## 2024-04-22 LAB — CUP PACEART REMOTE DEVICE CHECK
Date Time Interrogation Session: 20250504233216
Implantable Pulse Generator Implant Date: 20240730

## 2024-05-02 NOTE — Progress Notes (Signed)
 Carelink Summary Report / Loop Recorder

## 2024-05-03 ENCOUNTER — Encounter: Payer: Self-pay | Admitting: Family Medicine

## 2024-05-03 ENCOUNTER — Ambulatory Visit (INDEPENDENT_AMBULATORY_CARE_PROVIDER_SITE_OTHER): Admitting: Family Medicine

## 2024-05-03 VITALS — BP 130/74 | HR 83 | Temp 99.0°F | Ht 72.0 in | Wt 232.4 lb

## 2024-05-03 DIAGNOSIS — I10 Essential (primary) hypertension: Secondary | ICD-10-CM | POA: Diagnosis not present

## 2024-05-03 DIAGNOSIS — E1165 Type 2 diabetes mellitus with hyperglycemia: Secondary | ICD-10-CM | POA: Diagnosis not present

## 2024-05-03 DIAGNOSIS — Z7984 Long term (current) use of oral hypoglycemic drugs: Secondary | ICD-10-CM

## 2024-05-03 DIAGNOSIS — E876 Hypokalemia: Secondary | ICD-10-CM | POA: Diagnosis not present

## 2024-05-03 DIAGNOSIS — R5383 Other fatigue: Secondary | ICD-10-CM | POA: Diagnosis not present

## 2024-05-03 DIAGNOSIS — Z7985 Long-term (current) use of injectable non-insulin antidiabetic drugs: Secondary | ICD-10-CM

## 2024-05-03 LAB — COMPREHENSIVE METABOLIC PANEL WITH GFR
ALT: 13 U/L (ref 0–53)
AST: 20 U/L (ref 0–37)
Albumin: 4.5 g/dL (ref 3.5–5.2)
Alkaline Phosphatase: 64 U/L (ref 39–117)
BUN: 22 mg/dL (ref 6–23)
CO2: 29 meq/L (ref 19–32)
Calcium: 9.2 mg/dL (ref 8.4–10.5)
Chloride: 98 meq/L (ref 96–112)
Creatinine, Ser: 1.27 mg/dL (ref 0.40–1.50)
GFR: 62.22 mL/min (ref >60.00)
Glucose, Bld: 263 mg/dL — ABNORMAL HIGH (ref 70–99)
Potassium: 3.3 meq/L — ABNORMAL LOW (ref 3.5–5.1)
Sodium: 136 meq/L (ref 135–145)
Total Bilirubin: 0.5 mg/dL (ref 0.2–1.2)
Total Protein: 7.8 g/dL (ref 6.0–8.3)

## 2024-05-03 LAB — CBC
HCT: 36.3 % — ABNORMAL LOW (ref 39.0–52.0)
Hemoglobin: 12.9 g/dL — ABNORMAL LOW (ref 13.0–17.0)
MCHC: 35.6 g/dL (ref 30.0–36.0)
MCV: 86.3 fl (ref 78.0–100.0)
Platelets: 209 10*3/uL (ref 150.0–400.0)
RBC: 4.21 Mil/uL — ABNORMAL LOW (ref 4.22–5.81)
RDW: 13.3 % (ref 11.5–15.5)
WBC: 7.9 10*3/uL (ref 4.0–10.5)

## 2024-05-03 LAB — GLUCOSE, POCT (MANUAL RESULT ENTRY): POC Glucose: 253 mg/dL — AB (ref 70–99)

## 2024-05-03 NOTE — Progress Notes (Signed)
 Subjective:  Patient ID: Dylan Abbott, male    DOB: 1966/04/17  Age: 58 y.o. MRN: [id number]  CC:  Chief Complaint  Patient presents with   Medical Management of Chronic Issues    Pt is doing okay notes he has been feeling fatigued recently    Fatigue    Pt notes feeling of fatigue for last 2 weeks, no other sxs     HPI Bari Handshoe presents for  Diabetes: Complicated by hyperglycemia, CVD with prior suspected TIA.  Also followed by clinical pharmacist.  Treated with Lantus , Trulicity , metformin  and glipizide .  CGM for monitoring, dietary advice has been discussed including regular meals and snacks to minimize hypoglycemia.  Some difficulty obtaining CGM at his last visit. He also has a history of hypokalemia with dosage of potassium adjusted previously (plan for 40 mEq daily but had only been taking 30 mill equivalents at his April 11 visit.  prior slight increased range of his creatinine on prior testing, 1.76 on his March 14 visit up from range of 1.03-1.35.  Somewhat improved on April 11 at 1.52 and potassium low normal at that time.  He does report feeling fatigued for the last 2 weeks. Feels fatigued walking up stairs, not with ADL's.  No focal weakness, HA or slurred speech. No new vision symptoms.  No chest pain/palpitations.  No dyspnea.  No abd pain, melena/hematochezia.  Not checking blood sugars. Never heard back about CGM - not using and not checking home readings.  Has traditional fingerstick blood glucose meter with strip at home - not using.  Last used trulicity  12 days ago - ran out.  Did not call pharmacy for refill - appears refill is available on last rx at pharmacy - this has been sent in past.  Taking lantus  16 units per day - taking daily, no missed doses.  Glipizide  10mg  bid, metformin  850mg  BID -  denies missed dose Taking potassium 20meq BID - no missed doses.      Lab Results  Component Value Date   NA 138 03/29/2024   K 3.5 03/29/2024   CL 96  03/29/2024   CO2 33 (H) 03/29/2024   Lab Results  Component Value Date   CREATININE 1.52 (H) 03/29/2024     Lab Results  Component Value Date   HGBA1C 8.6 (H) 02/08/2024   HGBA1C 11.3 (H) 11/06/2023   HGBA1C 12.2 (H) 07/18/2023   HGBA1C 12.1 (H) 07/18/2023   Lab Results  Component Value Date   MICROALBUR 4.0 (H) 11/06/2023   LDLCALC 25 02/08/2024   CREATININE 1.52 (H) 03/29/2024     History Patient Active Problem List   Diagnosis Date Noted   NSTEMI (non-ST elevated myocardial infarction) (HCC) 07/16/2023   Impingement syndrome of left shoulder 07/30/2020   COVID-19 virus infection 10/31/2019   Abnormal liver function 10/31/2019   Hypokalemia 10/31/2019   Hyponatremia 10/31/2019   Diabetes mellitus (HCC) 11/13/2017   Hyperlipidemia 11/13/2017   Hypertensive disorder 11/13/2017   Past Medical History:  Diagnosis Date   Diabetes mellitus without complication (HCC)    Hyperlipidemia    Hypertension    Past Surgical History:  Procedure Laterality Date   LOOP RECORDER INSERTION N/A 07/18/2023   Procedure: LOOP RECORDER INSERTION;  Surgeon: Tylene Galla, PA-C;  Location: [facility];  Service: Cardiovascular;  Laterality: N/A;   No Known Allergies Prior to Admission medications   Medication Sig Start Date End Date Taking? Authorizing Provider  aspirin  EC 81 MG tablet Take  1 tablet (81 mg total) by mouth daily. 12/07/23  Yes Benjiman Bras, MD  atorvastatin  (LIPITOR) 40 MG tablet Take 1 tablet (40 mg total) by mouth at bedtime. 03/22/24  Yes Benjiman Bras, MD  Dulaglutide  (TRULICITY ) 1.5 MG/0.5ML SOAJ Inject 1.5 mg into the skin every 7 (seven) days. 03/22/24  Yes Benjiman Bras, MD  ezetimibe  (ZETIA ) 10 MG tablet Take 1 tablet (10 mg total) by mouth daily. 09/13/23  Yes Benjiman Bras, MD  fluticasone  (FLONASE ) 50 MCG/ACT nasal spray Place 1-2 sprays into both nostrils daily. 03/01/24  Yes Benjiman Bras, MD  glipiZIDE  (GLUCOTROL ) 10 MG  tablet Take 1 tablet (10 mg total) by mouth 2 (two) times daily before a meal. 02/01/24  Yes Benjiman Bras, MD  insulin  glargine (LANTUS ) 100 UNIT/ML Solostar Pen Inject 12 Units into the skin daily. Patient taking differently: Inject 16 Units into the skin daily. 09/13/23  Yes Benjiman Bras, MD  Insulin  Pen Needle (UNIFINE PENTIPS) 32G X 4 MM MISC Use daily as directed 08/03/23  Yes Benjiman Bras, MD  losartan  (COZAAR ) 100 MG tablet Take 1 tablet (100 mg total) by mouth daily. 03/22/24  Yes Benjiman Bras, MD  metFORMIN  (GLUCOPHAGE ) 850 MG tablet Take 1 tablet (850 mg total) by mouth 2 (two) times daily with a meal. 02/01/24  Yes Benjiman Bras, MD  NIFEdipine  (PROCARDIA  XL/NIFEDICAL-XL) 90 MG 24 hr tablet Take 90 mg by mouth daily.   Yes [provider]  potassium chloride  (KLOR-CON  M) 10 MEQ tablet Take 2 tablets (20 mEq total) by mouth 2 (two) times daily. 03/29/24  Yes Benjiman Bras, MD   Social History   Socioeconomic History   Marital status: Married    Spouse name: Not on file   Number of children: 2   Years of education: Not on file   Highest education level: Not on file  Occupational History   Occupation: environmental  Tobacco Use   Smoking status: Never   Smokeless tobacco: Never  Substance and Sexual Activity   Alcohol use: Never    Alcohol/week: 0.0 standard drinks of alcohol   Drug use: Never   Sexual activity: Yes  Other Topics Concern   Not on file  Social History Narrative   Not on file   Social Drivers of Health   Financial Resource Strain: Not on file  Food Insecurity: No Food Insecurity (07/17/2023)   Hunger Vital Sign    Worried About Running Out of Food in the Last Year: Never true    Ran Out of Food in the Last Year: Never true  Transportation Needs: No Transportation Needs (07/17/2023)   PRAPARE - Administrator, Civil Service (Medical): No    Lack of Transportation (Non-Medical): No  Physical Activity: Not on file   Stress: Not on file  Social Connections: Not on file  Intimate Partner Violence: Not At Risk (07/17/2023)   Humiliation, Afraid, Rape, and Kick questionnaire    Fear of Current or Ex-Partner: No    Emotionally Abused: No    Physically Abused: No    Sexually Abused: No    Review of Systems  Per HPI Objective:   Vitals:   05/03/24 1013  BP: 130/74  Pulse: 83  Temp: 99 F (37.2 C)  TempSrc: Temporal  SpO2: 97%  Weight: 232 lb 6.4 oz (105.4 kg)  Height: 6' (1.829 m)     Physical Exam Vitals reviewed.  Constitutional:  Appearance: He is well-developed.  HENT:     Head: Normocephalic and atraumatic.  Neck:     Vascular: No carotid bruit or JVD.  Cardiovascular:     Rate and Rhythm: Normal rate and regular rhythm.     Heart sounds: Normal heart sounds. No murmur heard. Pulmonary:     Effort: Pulmonary effort is normal.     Breath sounds: Normal breath sounds. No rales.  Musculoskeletal:     Right lower leg: No edema.     Left lower leg: No edema.  Skin:    General: Skin is warm and dry.  Neurological:     General: No focal deficit present.     Mental Status: He is alert and oriented to person, place, and time.     GCS: GCS eye subscore is 4. GCS verbal subscore is 5. GCS motor subscore is 6.     Cranial Nerves: No cranial nerve deficit, dysarthria or facial asymmetry.     Sensory: No sensory deficit.     Motor: Pronator drift present. No weakness (no focal weakness.).     Coordination: Coordination is intact. Finger-Nose-Finger Test and Heel to [facility] Test normal. Rapid alternating movements normal.     Gait: Gait is intact.  Psychiatric:        Mood and Affect: Mood normal.   EKG, sinus rhythm with first-degree AV block.  PR 236, compared to 184 previously on comparison EKG 07/20/2023.  Incomplete right bundle branch block also seen previously.  No apparent significant changes on comparison EKG 07/20/23 or apparent acute findings. Results for orders placed or  performed in visit on 05/03/24  POCT glucose (manual entry)   Collection Time: 05/03/24 11:28 AM  Result Value Ref Range   POC Glucose 253 (A) 70 - 99 mg/dl     52 minutes spent during visit, including chart review, counseling and assimilation of information, discussion of fatigue and possible causes, exam, discussion of plan, and chart completion.  Time is exclusive of EKG interpretation.   Assessment & Plan:  Hanad Leino is a 58 y.o. male . Fatigue, unspecified type - Plan: EKG 12-Lead, POCT glucose (manual entry), TSH, CBC, Comprehensive metabolic panel with GFR, CANCELED: CBC, CANCELED: Comprehensive metabolic panel with GFR  Hypokalemia - Plan: EKG 12-Lead, Comprehensive metabolic panel with GFR, CANCELED: Comprehensive metabolic panel with GFR  Essential hypertension - Plan: EKG 12-Lead, TSH, Comprehensive metabolic panel with GFR, CANCELED: Comprehensive metabolic panel with GFR  Type 2 diabetes mellitus with hyperglycemia, without long-term current use of insulin  (HCC) - Plan: EKG 12-Lead  Recent fatigue may be related to his diabetic control.  Hemoglobin lower than previous range but borderline and unlikely cause of fatigue.  Like to be rechecked at follow-up visit along with repeat potassium level on BMP given low reading at visit. I spoke with patient on May 17 regarding the results.. Will have him increase the potassium to 30 mEq twice per day.  Unfortunately has not been checking his blood sugars yet and he will do so with a call from staff on Monday to check blood sugar readings and then we can decide on adjustment of his insulin  dosing.  Close follow-up in 2 weeks with RTC precautions in the interim.  No orders of the defined types were placed in this encounter.  Patient Instructions  I will check some labs and contact you later today about any change in meds or plan depending on those results.  If any new or worsening symptoms proceed to  the emergency room.  Otherwise  lets recheck the fatigue in the next 2 weeks.  Make sure you are drinking plenty of fluids, regular meals.  Check blood sugar fasting and 2 hours after every meal.  If any low blood sugars let me know right away.  Return to the clinic or go to the nearest emergency room if any of your symptoms worsen or new symptoms occur.   Fatigue If you have fatigue, you feel tired all the time and have a lack of energy or a lack of motivation. Fatigue may make it difficult to start or complete tasks because of exhaustion. Occasional or mild fatigue is often a normal response to activity or life. However, long-term (chronic) or extreme fatigue may be a symptom of a medical condition such as: Depression. Not having enough red blood cells or hemoglobin in the blood (anemia). A problem with a small gland located in the lower front part of the neck (thyroid disorder). Rheumatologic conditions. These are problems related to the body's defense system (immune system). Infections, especially certain viral infections. Fatigue can also lead to negative health outcomes over time. Follow these instructions at home: Medicines Take over-the-counter and prescription medicines only as told by your health care provider. Take a multivitamin if told by your health care provider. Do not use herbal or dietary supplements unless they are approved by your health care provider. Eating and drinking  Avoid heavy meals in the evening. Eat a well-balanced diet, which includes lean proteins, whole grains, plenty of fruits and vegetables, and low-fat dairy products. Avoid eating or drinking too many products with caffeine in them. Avoid alcohol. Drink enough fluid to keep your urine pale yellow. Activity  Exercise regularly, as told by your health care provider. Use or practice techniques to help you relax, such as yoga, tai chi, meditation, or massage therapy. Lifestyle Change situations that cause you stress. Try to keep your  work and personal schedules in balance. Do not use recreational or illegal drugs. General instructions Monitor your fatigue for any changes. Go to bed and get up at the same time every day. Avoid fatigue by pacing yourself during the day and getting enough sleep at night. Maintain a healthy weight. Contact a health care provider if: Your fatigue does not get better. You have a fever. You suddenly lose or gain weight. You have headaches. You have trouble falling asleep or sleeping through the night. You feel angry, guilty, anxious, or sad. You have swelling in your legs or another part of your body. Get help right away if: You feel confused, feel like you might faint, or faint. Your vision is blurry or you have a severe headache. You have severe pain in your abdomen, your back, or the area between your waist and hips (pelvis). You have chest pain, shortness of breath, or an irregular or fast heartbeat. You are unable to urinate, or you urinate less than normal. You have abnormal bleeding from the rectum, nose, lungs, nipples, or, if you are male, the vagina. You vomit blood. You have thoughts about hurting yourself or others. These symptoms may be an emergency. Get help right away. Call 911. Do not wait to see if the symptoms will go away. Do not drive yourself to the hospital. Get help right away if you feel like you may hurt yourself or others, or have thoughts about taking your own life. Go to your nearest emergency room or: Call 911. Call the National Suicide Prevention Lifeline at 765 084 0517 or  988. This is open 24 hours a day. Text the Crisis Text Line at 5312986980. Summary If you have fatigue, you feel tired all the time and have a lack of energy or a lack of motivation. Fatigue may make it difficult to start or complete tasks because of exhaustion. Long-term (chronic) or extreme fatigue may be a symptom of a medical condition. Exercise regularly, as told by your health  care provider. Change situations that cause you stress. Try to keep your work and personal schedules in balance. This information is not intended to replace advice given to you by your health care provider. Make sure you discuss any questions you have with your health care provider. Document Revised: 09/27/2021 Document Reviewed: 09/27/2021 Elsevier Patient Education  2024 [employer]    Signed,   Caro Christmas, MD [facility], [facility] 05/03/24 11:30 AM

## 2024-05-03 NOTE — Patient Instructions (Addendum)
 I will check some labs and contact you later today about any change in meds or plan depending on those results.  If any new or worsening symptoms proceed to the emergency room.  Otherwise lets recheck the fatigue in the next 2 weeks.  Make sure you are drinking plenty of fluids, regular meals.  Check blood sugar fasting and 2 hours after every meal.  If any low blood sugars let me know right away.  Return to the clinic or go to the nearest emergency room if any of your symptoms worsen or new symptoms occur.   Fatigue If you have fatigue, you feel tired all the time and have a lack of energy or a lack of motivation. Fatigue may make it difficult to start or complete tasks because of exhaustion. Occasional or mild fatigue is often a normal response to activity or life. However, long-term (chronic) or extreme fatigue may be a symptom of a medical condition such as: Depression. Not having enough red blood cells or hemoglobin in the blood (anemia). A problem with a small gland located in the lower front part of the neck (thyroid disorder). Rheumatologic conditions. These are problems related to the body's defense system (immune system). Infections, especially certain viral infections. Fatigue can also lead to negative health outcomes over time. Follow these instructions at home: Medicines Take over-the-counter and prescription medicines only as told by your health care provider. Take a multivitamin if told by your health care provider. Do not use herbal or dietary supplements unless they are approved by your health care provider. Eating and drinking  Avoid heavy meals in the evening. Eat a well-balanced diet, which includes lean proteins, whole grains, plenty of fruits and vegetables, and low-fat dairy products. Avoid eating or drinking too many products with caffeine in them. Avoid alcohol. Drink enough fluid to keep your urine pale yellow. Activity  Exercise regularly, as told by your health  care provider. Use or practice techniques to help you relax, such as yoga, tai chi, meditation, or massage therapy. Lifestyle Change situations that cause you stress. Try to keep your work and personal schedules in balance. Do not use recreational or illegal drugs. General instructions Monitor your fatigue for any changes. Go to bed and get up at the same time every day. Avoid fatigue by pacing yourself during the day and getting enough sleep at night. Maintain a healthy weight. Contact a health care provider if: Your fatigue does not get better. You have a fever. You suddenly lose or gain weight. You have headaches. You have trouble falling asleep or sleeping through the night. You feel angry, guilty, anxious, or sad. You have swelling in your legs or another part of your body. Get help right away if: You feel confused, feel like you might faint, or faint. Your vision is blurry or you have a severe headache. You have severe pain in your abdomen, your back, or the area between your waist and hips (pelvis). You have chest pain, shortness of breath, or an irregular or fast heartbeat. You are unable to urinate, or you urinate less than normal. You have abnormal bleeding from the rectum, nose, lungs, nipples, or, if you are male, the vagina. You vomit blood. You have thoughts about hurting yourself or others. These symptoms may be an emergency. Get help right away. Call 911. Do not wait to see if the symptoms will go away. Do not drive yourself to the hospital. Get help right away if you feel like you may hurt  yourself or others, or have thoughts about taking your own life. Go to your nearest emergency room or: Call 911. Call the National Suicide Prevention Lifeline at (775)160-3401 or 988. This is open 24 hours a day. Text the Crisis Text Line at 9861518600. Summary If you have fatigue, you feel tired all the time and have a lack of energy or a lack of motivation. Fatigue may make it  difficult to start or complete tasks because of exhaustion. Long-term (chronic) or extreme fatigue may be a symptom of a medical condition. Exercise regularly, as told by your health care provider. Change situations that cause you stress. Try to keep your work and personal schedules in balance. This information is not intended to replace advice given to you by your health care provider. Make sure you discuss any questions you have with your health care provider. Document Revised: 09/27/2021 Document Reviewed: 09/27/2021 Elsevier Patient Education  2024 [employer].

## 2024-05-04 ENCOUNTER — Ambulatory Visit: Payer: Self-pay | Admitting: Family Medicine

## 2024-05-04 ENCOUNTER — Encounter: Payer: Self-pay | Admitting: Family Medicine

## 2024-05-06 NOTE — Telephone Encounter (Signed)
 Pt has been advised on PCP note

## 2024-05-06 NOTE — Telephone Encounter (Signed)
-----   Message from Larissa Plowman, [place] sent at 05/06/2024 11:46 AM EDT ----- Called pt and unable to leave vm

## 2024-05-06 NOTE — Telephone Encounter (Signed)
-----   Message from Dylan Abbott sent at 05/04/2024  5:06 PM EDT ----- I called patient with results on Saturday, May 17.  I asked him to increase his potassium supplement to 30 mill equivalents twice per day, up from his 20 mEq dosing currently.  Also discussed his blood counts.  Please call him on Monday.  Advised him to check his blood sugars multiple times per day and keep a record to discuss on Monday and then we can adjust his medications including insulin  dose if needed.  Will repeat some of these test at his follow-up in 2 weeks.  Please let me know what blood sugars he is getting at home.  Thanks

## 2024-05-17 ENCOUNTER — Other Ambulatory Visit: Payer: Self-pay | Admitting: Pharmacist

## 2024-05-17 ENCOUNTER — Other Ambulatory Visit (HOSPITAL_COMMUNITY): Payer: Self-pay

## 2024-05-17 ENCOUNTER — Other Ambulatory Visit: Payer: Self-pay

## 2024-05-17 MED ORDER — EZETIMIBE 10 MG PO TABS
10.0000 mg | ORAL_TABLET | Freq: Every day | ORAL | 0 refills | Status: DC
  Filled 2024-05-17: qty 90, 90d supply, fill #0

## 2024-05-17 MED ORDER — NIFEDIPINE ER OSMOTIC RELEASE 90 MG PO TB24
90.0000 mg | ORAL_TABLET | Freq: Every day | ORAL | 0 refills | Status: DC
  Filled 2024-05-17: qty 90, 90d supply, fill #0

## 2024-05-17 MED FILL — Glipizide Tab 10 MG: ORAL | 90 days supply | Qty: 180 | Fill #1 | Status: AC

## 2024-05-17 MED FILL — Metformin HCl Tab 850 MG: ORAL | 90 days supply | Qty: 180 | Fill #1 | Status: AC

## 2024-05-17 NOTE — Progress Notes (Signed)
 05/17/2024 Name: Dylan Abbott MRN: [id number] DOB: 1966/07/10  Chief Complaint  Patient presents with   Medication Adherence   Diabetes    Dylan Abbott is a 58 y.o. year old male who presented for a phone visit with the  Clinical Pharmacist today    They were referred to the pharmacist by their PCP for assistance in managing diabetes, medication access, and complex medication management.    Subjective:  Medication Access/Adherence  Patient was using Libre 3 Plus Continuous Glucose Monitor but he has run out of sensors. We need to get prior authorization. An appeal for [place] sensors was submitted [employer] prior authorization department in April 2025. Appeal Reference # [id number]. Prior authorization / appeal show that initial denial of Libre 3 sensor coverage was upheld.  (number [phone] for [employer] / [facility] services)   Current Pharmacy:  Melodee Spruce [facility] - [facility] [address] [place] [address] Phone: [phone] Fax: [phone]   Patient reports affordability concerns with their medications: Yes  - only when he has not met deductible.   Patient reports access/transportation concerns to their pharmacy: No  Patient reports adherence concerns with their medications:  No    Diabetes:  Current medications:  Lantus  16 units daily (takes around 6pm when he leaves for work); Trulicity  1.5mg  weekly (he reports he has been out of Trulicity  for 3 weeks)  metformin  850mg  twice a day and glipizide  10mg  daily.   Exercise - none currently, has an active job.   Has not been checking blood glucose - ran out of [place] sensors and insurance denied our appeal for coverage.    Hyperlipidemia/ASCVD Risk Reduction  Current lipid lowering medications:  atorvastatin  40mg  once a day and ezetimibe  10mg  daily  Antiplatelet regimen: aspirin  81mg  daily  Hypertension:  Current therapy: nifedipine  ER 90mg  daily and losartan  100mg  daily   Does not  check blood pressure at home. Blood pressure has been at goal during last few office visits.  BP Readings from Last 3 Encounters:  05/03/24 130/74  03/29/24 138/78  03/01/24 138/80     Hypokalemia:  Patient endorses that he is aware Dr Ester Helms increased dose of potassum to 30mEq twice a day after his last potassium level 05/03/2024. However today he reports that he has run out of potassium and has not taken any in the last few days.     Objective:  Lab Results  Component Value Date   HGBA1C 8.6 (H) 02/08/2024    Lab Results  Component Value Date   CREATININE 1.27 05/03/2024   BUN 22 05/03/2024   NA 136 05/03/2024   K 3.3 (L) 05/03/2024   CL 98 05/03/2024   CO2 29 05/03/2024    Lab Results  Component Value Date   CHOL 86 02/08/2024   HDL 47.90 02/08/2024   LDLCALC 25 02/08/2024   TRIG 66.0 02/08/2024   CHOLHDL 2 02/08/2024    Medications Reviewed Today     Reviewed by Cecilie Coffee, RPH-CPP (Pharmacist) on 05/17/24 at 1032  Med List Status: <None>   Medication Order Taking? Sig Documenting Provider Last Dose Status Informant  aspirin  EC 81 MG tablet [id number] Yes Take 1 tablet (81 mg total) by mouth daily. Benjiman Bras, MD Taking Active   atorvastatin  (LIPITOR) 40 MG tablet [id number] Yes Take 1 tablet (40 mg total) by mouth at bedtime. Benjiman Bras, MD Taking Active   Dulaglutide  (TRULICITY ) 1.5 MG/0.5ML [place] [id number] No Inject 1.5 mg into the skin every 7 (  seven) days.  Patient not taking: Reported on 05/17/2024   Benjiman Bras, MD Not Taking Active            Med Note Alida Ion, [place] B   Fri May 17, 2024 10:17 AM) Has been out for 3 weeks  ezetimibe  (ZETIA ) 10 MG tablet [id number] No Take 1 tablet (10 mg total) by mouth daily.  Patient not taking: Reported on 05/17/2024   Benjiman Bras, MD Not Taking Active   fluticasone  (FLONASE ) 50 MCG/ACT nasal spray [id number]  Place 1-2 sprays into both nostrils daily. Benjiman Bras, MD  Active   glipiZIDE   (GLUCOTROL ) 10 MG tablet [id number] Yes Take 1 tablet (10 mg total) by mouth 2 (two) times daily before a meal. Benjiman Bras, MD Taking Active   insulin  glargine (LANTUS ) 100 UNIT/ML Solostar Pen [id number] Yes Inject 12 Units into the skin daily.  Patient taking differently: Inject 16 Units into the skin daily.   Benjiman Bras, MD Taking Active   Insulin  Pen Needle (UNIFINE PENTIPS) 32G X 4 MM MISC [id number] Yes Use daily as directed Benjiman Bras, MD Taking Active   losartan  (COZAAR ) 100 MG tablet [id number] Yes Take 1 tablet (100 mg total) by mouth daily. Benjiman Bras, MD Taking Active   metFORMIN  (GLUCOPHAGE ) 850 MG tablet [id number] Yes Take 1 tablet (850 mg total) by mouth 2 (two) times daily with a meal. Benjiman Bras, MD Taking Active   NIFEdipine  (PROCARDIA  XL/NIFEDICAL-XL) 90 MG 24 hr tablet [id number] Yes Take 1 tablet (90 mg total) by mouth daily. Benjiman Bras, MD Taking Active   potassium chloride  (KLOR-CON  M) 10 MEQ tablet [id number] No Take 2 tablets (20 mEq total) by mouth 2 (two) times daily.  Patient not taking: Reported on 05/17/2024   Benjiman Bras, MD Not Taking Active              Assessment/Plan:  Medication Management:  - reviewed each medication with patient.  - Reviewed medications.  - Called [facility] to request the following refills with patient approval. Trulicity , ezetimibe , potassium and nifedipine   Hypokalemia:  - Instructed patient to take potassium 10mEq - 3 tablets twice a day - requested refill for patient - patient to follow up with Dr Ester Helms 05/20/2024  Diabetes:Currently uncontrolled  but last A1c had improved.   - Continue Lantus  to 16 units daily. - Restart Trulicity  to 1.5mg  weekly,  - Continue glipizide  10mg  and metformin  850mg     Meds ordered this encounter  Medications   ezetimibe  (ZETIA ) 10 MG tablet    Sig: Take 1 tablet (10 mg total) by mouth daily.    Dispense:  90 tablet    Refill:  0    NIFEdipine  (PROCARDIA  XL/NIFEDICAL-XL) 90 MG 24 hr tablet    Sig: Take 1 tablet (90 mg total) by mouth daily.    Dispense:  90 tablet    Refill:  0     Hyperlipidemia/ASCVD Risk Reduction - Continue atorvastatin  40mg  daily  - Continue ezetimibe  10mg  daily.     Follow Up Plan:appt with PCP 05/20/2024; Follow up with Clinical Pharmacist 06/05/2024 - in person at [facility].   Cecilie Coffee, PharmD Clinical Pharmacist [facility]  Population Health [phone]

## 2024-05-18 ENCOUNTER — Other Ambulatory Visit (HOSPITAL_COMMUNITY): Payer: Self-pay

## 2024-05-20 ENCOUNTER — Encounter: Payer: Self-pay | Admitting: Family Medicine

## 2024-05-20 ENCOUNTER — Ambulatory Visit (INDEPENDENT_AMBULATORY_CARE_PROVIDER_SITE_OTHER): Admitting: Family Medicine

## 2024-05-20 VITALS — BP 142/100 | HR 64 | Temp 98.5°F | Ht 72.0 in | Wt 231.8 lb

## 2024-05-20 DIAGNOSIS — R5383 Other fatigue: Secondary | ICD-10-CM

## 2024-05-20 DIAGNOSIS — E1165 Type 2 diabetes mellitus with hyperglycemia: Secondary | ICD-10-CM | POA: Diagnosis not present

## 2024-05-20 DIAGNOSIS — D649 Anemia, unspecified: Secondary | ICD-10-CM

## 2024-05-20 DIAGNOSIS — Z7985 Long-term (current) use of injectable non-insulin antidiabetic drugs: Secondary | ICD-10-CM

## 2024-05-20 DIAGNOSIS — E876 Hypokalemia: Secondary | ICD-10-CM

## 2024-05-20 DIAGNOSIS — I10 Essential (primary) hypertension: Secondary | ICD-10-CM

## 2024-05-20 LAB — COMPREHENSIVE METABOLIC PANEL WITH GFR
ALT: 14 U/L (ref 0–53)
AST: 21 U/L (ref 0–37)
Albumin: 4.3 g/dL (ref 3.5–5.2)
Alkaline Phosphatase: 63 U/L (ref 39–117)
BUN: 20 mg/dL (ref 6–23)
CO2: 29 meq/L (ref 19–32)
Calcium: 9.7 mg/dL (ref 8.4–10.5)
Chloride: 98 meq/L (ref 96–112)
Creatinine, Ser: 1.28 mg/dL (ref 0.40–1.50)
GFR: 61.61 mL/min (ref >60.00)
Glucose, Bld: 265 mg/dL — ABNORMAL HIGH (ref 70–99)
Potassium: 3.4 meq/L — ABNORMAL LOW (ref 3.5–5.1)
Sodium: 135 meq/L (ref 135–145)
Total Bilirubin: 0.5 mg/dL (ref 0.2–1.2)
Total Protein: 7.4 g/dL (ref 6.0–8.3)

## 2024-05-20 LAB — CBC
HCT: 36 % — ABNORMAL LOW (ref 39.0–52.0)
Hemoglobin: 12.8 g/dL — ABNORMAL LOW (ref 13.0–17.0)
MCHC: 35.4 g/dL (ref 30.0–36.0)
MCV: 87 fl (ref 78.0–100.0)
Platelets: 192 10*3/uL (ref 150.0–400.0)
RBC: 4.14 Mil/uL — ABNORMAL LOW (ref 4.22–5.81)
RDW: 13.4 % (ref 11.5–15.5)
WBC: 7.5 10*3/uL (ref 4.0–10.5)

## 2024-05-20 LAB — HEMOGLOBIN A1C: Hgb A1c MFr Bld: 11.4 % — ABNORMAL HIGH (ref 4.6–6.5)

## 2024-05-20 NOTE — Patient Instructions (Signed)
 Since you just started the Trulicity  I am not making any changes today.  However if you continue to have blood sugar readings in the 200s beyond of this week, let me know and we may need to make some changes to your insulin  dose.  Keep follow-up with pharmacist in 10 days as planned and follow-up with me in 1 month.  If any concerns on labs I will let you know.  Return to the clinic or go to the nearest emergency room if any of your symptoms worsen or new symptoms occur.

## 2024-05-20 NOTE — Progress Notes (Signed)
 Subjective:  Patient ID: Dylan Abbott, male    DOB: 11/13/1966  Age: 58 y.o. MRN: [id number]  CC:  Chief Complaint  Patient presents with   Fatigue    Patient states his fatigue is okay right now. No other concerns to discuss.     HPI Dylan Abbott presents for   Fatigue Discussed at his May 16 visit.  Approximately 2-week history of fatigue at that time.  Fatigue walking up stairs but not with ADLs, no focal weakness headache or slurred speech at that time, no chest pain palpitations dyspnea visual symptoms abdominal pain melena or hematochezia at that time. He does have history of diabetes with variable control, not checking blood sugars at his last visit.  Insulin -dependent.  On 16 units Lantus  per day along with 10 mg glipizide  twice daily, metformin  850 mg twice daily.  Also should be on Trulicity  1.5 mg weekly but had reported being out of that for 3 weeks at his last pharmacy visit on May 30.  Additionally has history of hypokalemia treated with potassium 20 mEq twice daily as of his last visit.  There was a thought that part of the fatigue could be related to his variable diabetic control.  However his potassium was still low and a lower hemoglobin on most recent CBC.  Recommended increasing his potassium to 30 mill equivalents twice daily.  Stressed importance of home monitoring of blood sugar to assist with decisions on adjusting his insulin  dosing.  Visit with pharmacist noted on Friday.  Out of Trulicity  for 3 weeks.  Had been taking Lantus  16 units, glipizide  and metformin  as above.  Not checking blood sugar as libre sensors not covered including after appeal for coverage.  Also noted that he did run out of his potassium and not taking any in the previous few days at his May 30 pharmacy visit. Recommended to restart Trulicity , no other diabetic med changes. Refill of potassium requested, along with refills for Trulicity , ezetimibe  and nifedipine .  Lab Results  Component  Value Date   NA 136 05/03/2024   K 3.3 (L) 05/03/2024   CL 98 05/03/2024   CO2 29 05/03/2024   Lab Results  Component Value Date   WBC 7.9 05/03/2024   HGB 12.9 (L) 05/03/2024   HCT 36.3 (L) 05/03/2024   MCV 86.3 05/03/2024   PLT 209.0 05/03/2024  Previous potassium 3.5 on 03/29/2024 Previous hemoglobin 15.1 on 07/20/2023.  Range of 13.8-15.1 over the past 4 years.  Today, reports that fatigue has been improving. Better day after last visit. Back to normal.  No melena/hematochezia/abd pain.  Cologuard negative 04/12/24.  Back on potassium 10meq taking 3 BID - reports no missed doses - had not missed any potassium last week.   Blood sugar 246 this morning.  Had been off trulicity  for a few weeks, restarted 2 days ago.  Still on 16 units lantus .  Lab Results  Component Value Date   HGBA1C 8.6 (H) 02/08/2024    BP Readings from Last 3 Encounters:  05/20/24 (!) 142/100  05/03/24 130/74  03/29/24 138/78  Blood pressure elevated in office today.  Stable on prior 2 visits. No home readings.  Had been out of nifedipine  for 2 weeks, restarted this morning - just took prior to OV - has at home now.  No CP/HA today. No new vision changes.    History Patient Active Problem List   Diagnosis Date Noted   NSTEMI (non-ST elevated myocardial infarction) (HCC) 07/16/2023   Impingement  syndrome of left shoulder 07/30/2020   COVID-19 virus infection 10/31/2019   Abnormal liver function 10/31/2019   Hypokalemia 10/31/2019   Hyponatremia 10/31/2019   Diabetes mellitus (HCC) 11/13/2017   Hyperlipidemia 11/13/2017   Hypertensive disorder 11/13/2017   Past Medical History:  Diagnosis Date   Diabetes mellitus without complication (HCC)    Hyperlipidemia    Hypertension    Past Surgical History:  Procedure Laterality Date   LOOP RECORDER INSERTION N/A 07/18/2023   Procedure: LOOP RECORDER INSERTION;  Surgeon: Tylene Galla, PA-C;  Location: [facility];  Service:  Cardiovascular;  Laterality: N/A;   No Known Allergies Prior to Admission medications   Medication Sig Start Date End Date Taking? Authorizing Provider  aspirin  EC 81 MG tablet Take 1 tablet (81 mg total) by mouth daily. 12/07/23  Yes Benjiman Bras, MD  atorvastatin  (LIPITOR) 40 MG tablet Take 1 tablet (40 mg total) by mouth at bedtime. 03/22/24  Yes Benjiman Bras, MD  Dulaglutide  (TRULICITY ) 1.5 MG/0.5ML SOAJ Inject 1.5 mg into the skin every 7 (seven) days. 03/22/24  Yes Benjiman Bras, MD  ezetimibe  (ZETIA ) 10 MG tablet Take 1 tablet (10 mg total) by mouth daily. 05/17/24  Yes Benjiman Bras, MD  fluticasone  (FLONASE ) 50 MCG/ACT nasal spray Place 1-2 sprays into both nostrils daily. 03/01/24  Yes Benjiman Bras, MD  glipiZIDE  (GLUCOTROL ) 10 MG tablet Take 1 tablet (10 mg total) by mouth 2 (two) times daily before a meal. 02/01/24  Yes Benjiman Bras, MD  insulin  glargine (LANTUS ) 100 UNIT/ML Solostar Pen Inject 12 Units into the skin daily. Patient taking differently: Inject 16 Units into the skin daily. 09/13/23  Yes Benjiman Bras, MD  Insulin  Pen Needle (UNIFINE PENTIPS) 32G X 4 MM MISC Use daily as directed 08/03/23  Yes Benjiman Bras, MD  losartan  (COZAAR ) 100 MG tablet Take 1 tablet (100 mg total) by mouth daily. 03/22/24  Yes Benjiman Bras, MD  metFORMIN  (GLUCOPHAGE ) 850 MG tablet Take 1 tablet (850 mg total) by mouth 2 (two) times daily with a meal. 02/01/24  Yes Benjiman Bras, MD  NIFEdipine  (PROCARDIA  XL/NIFEDICAL-XL) 90 MG 24 hr tablet Take 1 tablet (90 mg total) by mouth daily. 05/17/24  Yes Benjiman Bras, MD  potassium chloride  (KLOR-CON  M) 10 MEQ tablet Take 2 tablets (20 mEq total) by mouth 2 (two) times daily. 03/29/24  Yes Benjiman Bras, MD   Social History   Socioeconomic History   Marital status: Married    Spouse name: Not on file   Number of children: 2   Years of education: Not on file   Highest education level: Not on file   Occupational History   Occupation: environmental  Tobacco Use   Smoking status: Never   Smokeless tobacco: Never  Substance and Sexual Activity   Alcohol use: Never    Alcohol/week: 0.0 standard drinks of alcohol   Drug use: Never   Sexual activity: Yes  Other Topics Concern   Not on file  Social History Narrative   Not on file   Social Drivers of Health   Financial Resource Strain: Low Risk  (05/20/2024)   Overall Financial Resource Strain (CARDIA)    Difficulty of Paying Living Expenses: Not hard at all  Food Insecurity: No Food Insecurity (07/17/2023)   Hunger Vital Sign    Worried About Running Out of Food in the Last Year: Never true    Ran Out of Food in  the Last Year: Never true  Transportation Needs: No Transportation Needs (07/17/2023)   PRAPARE - Administrator, Civil Service (Medical): No    Lack of Transportation (Non-Medical): No  Physical Activity: Inactive (05/20/2024)   Exercise Vital Sign    Days of Exercise per Week: 0 days    Minutes of Exercise per Session: 0 min  Stress: No Stress Concern Present (05/20/2024)   [employer] of Occupational Health - Occupational Stress Questionnaire    Feeling of Stress : Not at all  Social Connections: Not on file  Intimate Partner Violence: Not At Risk (07/17/2023)   Humiliation, Afraid, Rape, and Kick questionnaire    Fear of Current or Ex-Partner: No    Emotionally Abused: No    Physically Abused: No    Sexually Abused: No    Review of Systems Per HPI  Objective:   Vitals:   05/20/24 1124  BP: (!) 142/100  Pulse: 64  Temp: 98.5 F (36.9 C)  TempSrc: Oral  SpO2: 95%  Weight: 231 lb 12.8 oz (105.1 kg)  Height: 6' (1.829 m)     Physical Exam Vitals reviewed.  Constitutional:      Appearance: He is well-developed.  HENT:     Head: Normocephalic and atraumatic.  Neck:     Vascular: No carotid bruit or JVD.  Cardiovascular:     Rate and Rhythm: Normal rate and regular rhythm.     Heart  sounds: Normal heart sounds. No murmur heard. Pulmonary:     Effort: Pulmonary effort is normal.     Breath sounds: Normal breath sounds. No rales.  Musculoskeletal:     Right lower leg: No edema.     Left lower leg: No edema.  Skin:    General: Skin is warm and dry.  Neurological:     Mental Status: He is alert and oriented to person, place, and time.  Psychiatric:        Mood and Affect: Mood normal.        Assessment & Plan:  Dylan Abbott is a 58 y.o. male . Low hemoglobin - Plan: CBC Type 2 diabetes mellitus with hyperglycemia, without long-term current use of insulin  (HCC) - Plan: Hemoglobin A1c Fatigue, unspecified type - Plan: Comprehensive metabolic panel with GFR, Hemoglobin A1c, CBC  - Fatigue may have been multifactorial.  Borderline low hemoglobin last visit, hypokalemia at that time as well as well as veritable diabetic control.  Symptoms improved soon after last visit.  Check updated CBC today, A1c, anticipate that will be elevated with variable dosing of medicines and off Trulicity .  Has since restarted.  Adjustment and plan accordingly based on lab results.  Continue potassium 30 mill equivalents twice daily for now.  - Appreciate assistance from pharmacist.  Does have an appointment with her in 10 days and can recheck blood sugar control at that time as well as any med needs but appears to be up-to-date on refills at this time.  If he continues to have readings in the 200s past this week may need to adjust medications but again just restarted Trulicity .  Hypokalemia - Plan: Comprehensive metabolic panel with GFR  - As above, check labs and adjust medication dose accordingly.  Essential hypertension  - Elevated in office, one of his antihypertensives just restarted this morning.  Asymptomatic.  Continue to monitor  No orders of the defined types were placed in this encounter.  Patient Instructions  Since you just started the Trulicity  I am not  making any changes  today.  However if you continue to have blood sugar readings in the 200s beyond of this week, let me know and we may need to make some changes to your insulin  dose.  Keep follow-up with pharmacist in 10 days as planned and follow-up with me in 1 month.  If any concerns on labs I will let you know.  Return to the clinic or go to the nearest emergency room if any of your symptoms worsen or new symptoms occur.     Signed,   Caro Christmas, MD [facility], [facility] 05/20/24 1:15 PM

## 2024-05-25 ENCOUNTER — Ambulatory Visit: Payer: Self-pay | Admitting: Family Medicine

## 2024-05-30 ENCOUNTER — Other Ambulatory Visit: Payer: Self-pay | Admitting: Pharmacist

## 2024-05-30 NOTE — Progress Notes (Signed)
 05/30/2024 Name: Dylan Abbott MRN: [id number] DOB: [birth year]  Chief Complaint  Patient presents with   Diabetes   Medication Management    Barnes Florek is a 58 y.o. year old male who presented for an in person visit with the  Clinical Pharmacist today    They were referred to the pharmacist by their PCP for assistance in managing diabetes, medication access, and complex medication management.    Subjective:  Medication Access/Adherence  Patient has restarted using Libre 3 Plus Continuous Glucose Monitoring system. He had run out of both [place] sensors and Trulicity  about 4 to 6 weeks ago.  We have tried to get prior authorization for the [facility] but was initial prior authorization and appeal were both denied.  (number [phone] for [employer] / [employer] Rx services)  His plan covers DexCom G6 but his phone is not compatible with their app. Also not compatible with G7 app.   Current Pharmacy:  Maryan Smalling - [facility] [address] [place] [address] Phone: [phone] Fax: [phone]   Patient reports affordability concerns with their medications: Yes  - only when he has not met deductible.   Patient reports access/transportation concerns to their pharmacy: No  Patient reports adherence concerns with their medications:  No    Diabetes:  Current medications:  Lantus  16 units daily (takes around 6pm when he leaves for work); Trulicity  1.5mg  weekly  metformin  850mg  twice a day and glipizide  10mg  daily.   Exercise - none currently, has an active job.   Restarted using Libre 3 sensors 05/20/2024  CGM Documentation:  Days Worn: 14 (recommend 14 days) % Time CGM is active: 73% (goal >=70%) Average Glucose: 260 mg/dL Glucose Management Indicator: 9.5% Glucose Variability: 33.7% (goal <36%) Time in Range:  - Time above range >250: 46%% (typical goal: <5%) - Time above range 181-250: 35%% (typical goal <20%) - Time in range  70-180 19%% (typical goal >=70%) - Time below range 54-69: 0% (typical goal <4%) - Time below range: 0% (typical goal <1%)            Hyperlipidemia/ASCVD Risk Reduction  Current lipid lowering medications:  atorvastatin  40mg  once a day and ezetimibe  10mg  daily  Antiplatelet regimen: aspirin  81mg  daily  Hypertension:  Current therapy: nifedipine  ER 90mg  daily and losartan  100mg  daily   Does not check blood pressure at home.   BP Readings from Last 3 Encounters:  05/30/24 136/80  05/20/24 (!) 142/100  05/03/24 130/74     Objective:  Lab Results  Component Value Date   HGBA1C 11.4 (H) 05/20/2024    Lab Results  Component Value Date   CREATININE 1.28 05/20/2024   BUN 20 05/20/2024   NA 135 05/20/2024   K 3.4 (L) 05/20/2024   CL 98 05/20/2024   CO2 29 05/20/2024    Lab Results  Component Value Date   CHOL 86 02/08/2024   HDL 47.90 02/08/2024   LDLCALC 25 02/08/2024   TRIG 66.0 02/08/2024   CHOLHDL 2 02/08/2024    Medications Reviewed Today     Reviewed by Cecilie Coffee, RPH-CPP (Pharmacist) on 05/30/24 at 1539  Med List Status: <None>   Medication Order Taking? Sig Documenting Provider Last Dose Status Informant  aspirin  EC 81 MG tablet [id number]  Take 1 tablet (81 mg total) by mouth daily. Benjiman Bras, MD  Active   atorvastatin  (LIPITOR) 40 MG tablet [id number] Yes Take 1 tablet (40 mg total) by mouth at bedtime. Caro Christmas  R, MD  Active   Dulaglutide  (TRULICITY ) 1.5 MG/0.5ML SOAJ [id number] Yes Inject 1.5 mg into the skin every 7 (seven) days. Benjiman Bras, MD  Active            Med Note [facility], [place] B   Fri May 17, 2024 10:17 AM) Has been out for 3 weeks  ezetimibe  (ZETIA ) 10 MG tablet [id number] Yes Take 1 tablet (10 mg total) by mouth daily. Benjiman Bras, MD  Active   fluticasone  (FLONASE ) 50 MCG/ACT nasal spray [id number]  Place 1-2 sprays into both nostrils daily. Benjiman Bras, MD  Active   glipiZIDE  (GLUCOTROL ) 10 MG  tablet [id number] Yes Take 1 tablet (10 mg total) by mouth 2 (two) times daily before a meal. Benjiman Bras, MD  Active   insulin  glargine (LANTUS ) 100 UNIT/ML Solostar Pen [id number] Yes Inject 12 Units into the skin daily.  Patient taking differently: Inject 16 Units into the skin daily.   Benjiman Bras, MD  Active   Insulin  Pen Needle (UNIFINE PENTIPS) 32G X 4 MM MISC [id number] Yes Use daily as directed Benjiman Bras, MD  Active   losartan  (COZAAR ) 100 MG tablet [id number] Yes Take 1 tablet (100 mg total) by mouth daily. Benjiman Bras, MD  Active   metFORMIN  (GLUCOPHAGE ) 850 MG tablet [id number] Yes Take 1 tablet (850 mg total) by mouth 2 (two) times daily with a meal. Benjiman Bras, MD  Active   NIFEdipine  (PROCARDIA  XL/NIFEDICAL-XL) 90 MG 24 hr tablet [id number] Yes Take 1 tablet (90 mg total) by mouth daily. Benjiman Bras, MD  Active   potassium chloride  (KLOR-CON  M) 10 MEQ tablet [id number] Yes Take 2 tablets (20 mEq total) by mouth 2 (two) times daily. Benjiman Bras, MD  Active              Assessment/Plan:  Medication Management:  - reviewed each medication with patient. No refills needed at this time  Diabetes:Currently uncontrolled  but per Continuous Glucose Monitor report blood glucose has improved over the last 3 to 5 days. He did have one low this am - was while he was sleeping so could have been a compression low.  - Continue Lantus  to 16 units daily. - Continue Trulicity  to 1.5mg  weekly - will consider increasing to 3mg  weekly with next refill at the end of June - Continue glipizide  10mg  and metformin  850mg  - Continue to use Libre 3 + sensors - provided patient with 2 samples today. He will look into getting a new phone so that he can use the DexCom G6 or G7 app since that is the Continuous Glucose Monitor system that his insurance prefers.    Hyperlipidemia/ASCVD Risk Reduction - Continue atorvastatin  40mg  daily  - Continue ezetimibe  10mg  daily.     Hypertension- blood pressure at goal today:  - Continue losartan  and nifedipine   Follow Up Plan: 1 week  Cecilie Coffee, PharmD Clinical Pharmacist [facility]  Population Health [phone]

## 2024-06-04 ENCOUNTER — Other Ambulatory Visit: Payer: Self-pay | Admitting: Pharmacist

## 2024-06-04 NOTE — Progress Notes (Signed)
 06/04/2024 Name: Dylan Abbott MRN: [id number] DOB: 1966/01/22  Chief Complaint  Patient presents with   Diabetes    Dylan Abbott is a 58 y.o. year old male who presented for a phone visit with the Clinical Pharmacist today    They were referred to the pharmacist by their PCP for assistance in managing diabetes, medication access, and complex medication management.    Subjective:  Medication Access/Adherence  Patient has restarted using Libre 3 Plus Continuous Glucose Monitoring system. He had run out of both Evans sensors and Trulicity  about 4 to 6 weeks ago.  We have tried to get prior authorization for the [facility] but was initial prior authorization and appeal were both denied.  (number [phone] for [employer] / [facility] [facility] services)  His plan covers DexCom G6 but his phone is not compatible with their app. Also not compatible with G7 app.   Current Pharmacy:  Maryan Smalling - [facility] [address] [place] [address] Phone: [phone] Fax: [phone]   Patient reports affordability concerns with their medications: Yes  - only when he has not met deductible.   Patient reports access/transportation concerns to their pharmacy: No  Patient reports adherence concerns with their medications:  No    Diabetes:  Current medications:  Lantus  16 units daily (takes around 6pm when he leaves for work); Trulicity  1.5mg  weekly  metformin  850mg  twice a day and glipizide  10mg  daily.   Exercise - none currently, has an active job.   Restarted using Libre 3 sensors 05/20/2024  CGM Documentation:  Days Worn: 14 (recommend 14 days) % Time CGM is active: 97% (goal >=70%) Average Glucose: 212 mg/dL (previously was 034) Glucose Management Indicator: 8.4% (previously was 9.5%) Glucose Variability: 45.2% (goal <36%) Time in Range:  - Time above range >250: 30% (typical goal: <5%)(previously was 45%) - Time above range 181-250: 30% (typical  goal <20%)(previously was 35%) - Time in range 70-180 37%(typical goal >=70%)(previously was 19%) - Time below range 54-69: 3% (typical goal <4%)(previously was 0%) - Time below range: 0% (typical goal <1%)(previously was 0%)  Blood glucose trends: Since restarting Trulicity  blood glucose has improved. He has actually had more lows recently - occurring when he sleeps which is usually around 6am to 1 or 2pm (patient works 3rd shift). Highs usually occur after meals.            Hyperlipidemia/ASCVD Risk Reduction  Current lipid lowering medications:  atorvastatin  40mg  once a day and ezetimibe  10mg  daily  Antiplatelet regimen: aspirin  81mg  daily  Hypertension:  Current therapy: nifedipine  ER 90mg  daily and losartan  100mg  daily   Does not check blood pressure at home.   BP Readings from Last 3 Encounters:  05/30/24 136/80  05/20/24 (!) 142/100  05/03/24 130/74     Objective:  Lab Results  Component Value Date   HGBA1C 11.4 (H) 05/20/2024    Lab Results  Component Value Date   CREATININE 1.28 05/20/2024   BUN 20 05/20/2024   NA 135 05/20/2024   K 3.4 (L) 05/20/2024   CL 98 05/20/2024   CO2 29 05/20/2024    Lab Results  Component Value Date   CHOL 86 02/08/2024   HDL 47.90 02/08/2024   LDLCALC 25 02/08/2024   TRIG 66.0 02/08/2024   CHOLHDL 2 02/08/2024    Medications Reviewed Today     Reviewed by Cecilie Coffee, RPH-CPP (Pharmacist) on 06/04/24 at 1419  Med List Status: <None>   Medication Order Taking? Sig  Documenting Provider Last Dose Status Informant  aspirin  EC 81 MG tablet [id number] Yes Take 1 tablet (81 mg total) by mouth daily. Benjiman Bras, MD  Active   atorvastatin  (LIPITOR) 40 MG tablet [id number] Yes Take 1 tablet (40 mg total) by mouth at bedtime. Benjiman Bras, MD  Active   Dulaglutide  (TRULICITY ) 1.5 MG/0.5ML [place] [id number] Yes Inject 1.5 mg into the skin every 7 (seven) days. Benjiman Bras, MD  Active            Med Note  [facility] [place] [facility]   Fri May 17, 2024 10:17 AM) Has been out for 3 weeks  ezetimibe  (ZETIA ) 10 MG tablet [id number] Yes Take 1 tablet (10 mg total) by mouth daily. Benjiman Bras, MD  Active   fluticasone  (FLONASE ) 50 MCG/ACT nasal spray [id number] Yes Place 1-2 sprays into both nostrils daily. Benjiman Bras, MD  Active   glipiZIDE  (GLUCOTROL ) 10 MG tablet [id number] Yes Take 1 tablet (10 mg total) by mouth 2 (two) times daily before a meal. Benjiman Bras, MD  Active   insulin  glargine (LANTUS ) 100 UNIT/ML Solostar Pen [id number] Yes Inject 12 Units into the skin daily.  Patient taking differently: Inject 16 Units into the skin daily.   Benjiman Bras, MD  Active   Insulin  Pen Needle (UNIFINE PENTIPS) 32G X 4 MM MISC [id number] Yes Use daily as directed Benjiman Bras, MD  Active   losartan  (COZAAR ) 100 MG tablet [id number] Yes Take 1 tablet (100 mg total) by mouth daily. Benjiman Bras, MD  Active   metFORMIN  (GLUCOPHAGE ) 850 MG tablet [id number] Yes Take 1 tablet (850 mg total) by mouth 2 (two) times daily with a meal. Benjiman Bras, MD  Active   NIFEdipine  (PROCARDIA  XL/NIFEDICAL-XL) 90 MG 24 hr tablet [id number] Yes Take 1 tablet (90 mg total) by mouth daily. Benjiman Bras, MD  Active   potassium chloride  (KLOR-CON  M) 10 MEQ tablet [id number] Yes Take 2 tablets (20 mEq total) by mouth 2 (two) times daily. Benjiman Bras, MD  Active              Assessment/Plan:  Medication Management:  - reviewed each medication with patient. He will be due to refill Trulicity  soon.   Diabetes:Currently uncontrolled  but per Continuous Glucose Monitor report blood glucose has improved over the last 7 to 10 days but he now is having more hypoglycemia events.   - Decreased Lantus  to 12 units daily. - Continue Trulicity  to 1.5mg  weekly - will ask Dr Ester Helms about possibly increasing to 3mg  weekly and lowering dose of Lantus  more.  - Continue glipizide  10mg  and metformin  850mg  -  Continue to use Libre 3 + sensors - provided patient with 2 samples today. He will look into getting a new phone so that he can use the DexCom G6 or G7 app since that is the Continuous Glucose Monitor system that his insurance prefers.    Hyperlipidemia/ASCVD Risk Reduction - Continue atorvastatin  40mg  daily  - Continue ezetimibe  10mg  daily.    Hypertension- blood pressure at goal today:  - Continue losartan  and nifedipine   Follow Up Plan: 1 week Appt with PCP is scheduled for 06/24/24  Cecilie Coffee, PharmD Clinical Pharmacist [facility]  Population Health [phone]

## 2024-06-10 NOTE — Progress Notes (Signed)
 Carelink Summary Report / Loop Recorder

## 2024-06-11 ENCOUNTER — Other Ambulatory Visit: Payer: Self-pay | Admitting: Family Medicine

## 2024-06-11 ENCOUNTER — Other Ambulatory Visit: Payer: Self-pay | Admitting: Pharmacist

## 2024-06-11 ENCOUNTER — Other Ambulatory Visit (HOSPITAL_COMMUNITY): Payer: Self-pay

## 2024-06-11 NOTE — Progress Notes (Signed)
 06/11/2024 Name: Dylan Abbott MRN: [id number] DOB: [birth year]  Chief Complaint  Patient presents with   Medication Management    Dylan Abbott is a 58 y.o. year old male who presented for a phone visit with the Clinical Pharmacist today    They were referred to the pharmacist by their PCP for assistance in managing diabetes, medication access, and complex medication management.    Subjective:  Medication Access/Adherence  Patient has restarted using Libre 3 Plus Continuous Glucose Monitoring system.  We have tried to get prior authorization for the [facility] but was initial prior authorization and appeal were both denied.  (number [id number] for [employer] / [employer] services)  His plan covers DexCom G6 but his phone is not compatible with their app. Also not compatible with G7 app.   Current Pharmacy:  [name] - [facility] [address] [place] [address] Phone: [phone] Fax: [phone]   Patient reports affordability concerns with their medications: Yes  - only when he has not met deductible.   Patient reports access/transportation concerns to their pharmacy: No  Patient reports adherence concerns with their medications:  No    Diabetes:  Current medications:  Lantus  12 units daily (takes around 6pm when he leaves for work); Trulicity  1.5mg  weekly  metformin  850mg  twice a day and glipizide  10mg  daily.   Exercise - none currently, has an active job.   Restarted using Libre 3 sensors 05/20/2024  CGM Documentation:  Days Worn: 14 (recommend 14 days) % Time CGM is active: 96% (goal >=70%) Average Glucose: 167 mg/dL (previously was 787) Glucose Management Indicator: 7.3% (previously was 8.4%) Glucose Variability: 41.6% (goal <36%) Time in Range:  - Time above range >250: 14% (typical goal: <5%)(previously was 30%) - Time above range 181-250: 23% (typical goal <20%)(previously was 30%) - Time in range 70-180  59%(typical goal >=70%)(previously was 37%) - Time below range 54-69: 4% (typical goal <4%)(previously was 3%) - Time below range: 0% (typical goal <1%)(previously was 0%)  Blood glucose trends: Since restarting Trulicity  blood glucose has improved. He was having a lot of lows about 2 weeks ago but since lose of Lantus  was lowered to 12 units daily, he has only had 1 low blood glucose event.              Hyperlipidemia/ASCVD Risk Reduction  Current lipid lowering medications:  atorvastatin  40mg  once a day and ezetimibe  10mg  daily  Antiplatelet regimen: aspirin  81mg  daily  Hypertension:  Current therapy: nifedipine  ER 90mg  daily and losartan  100mg  daily   Does not check blood pressure at home.   BP Readings from Last 3 Encounters:  05/30/24 136/80  05/20/24 (!) 142/100  05/03/24 130/74     Objective:  Lab Results  Component Value Date   HGBA1C 11.4 (H) 05/20/2024    Lab Results  Component Value Date   CREATININE 1.28 05/20/2024   BUN 20 05/20/2024   NA 135 05/20/2024   K 3.4 (L) 05/20/2024   CL 98 05/20/2024   CO2 29 05/20/2024    Lab Results  Component Value Date   CHOL 86 02/08/2024   HDL 47.90 02/08/2024   LDLCALC 25 02/08/2024   TRIG 66.0 02/08/2024   CHOLHDL 2 02/08/2024    Medications Reviewed Today     Reviewed by Carla Milling, RPH-CPP (Pharmacist) on 06/11/24 at 1446  Med List Status: <None>   Medication Order Taking? Sig Documenting Provider Last Dose Status Informant  aspirin  EC 81 MG tablet  [id number] Yes Take 1 tablet (81 mg total) by mouth daily. Levora Reyes SAUNDERS, MD  Active   atorvastatin  (LIPITOR) 40 MG tablet [id number] Yes Take 1 tablet (40 mg total) by mouth at bedtime. Levora Reyes SAUNDERS, MD  Active   Dulaglutide  (TRULICITY ) 1.5 MG/0.5ML EMMANUEL [id number] Yes Inject 1.5 mg into the skin every 7 (seven) days. Levora Reyes SAUNDERS, MD  Active            Med Note [facility] [place] [facility]   Fri May 17, 2024 10:17 AM) Has been out for 3 weeks   ezetimibe  (ZETIA ) 10 MG tablet [id number] Yes Take 1 tablet (10 mg total) by mouth daily. Levora Reyes SAUNDERS, MD  Active   fluticasone  (FLONASE ) 50 MCG/ACT nasal spray [id number]  Place 1-2 sprays into both nostrils daily.  Patient not taking: Reported on 06/11/2024   Levora Reyes SAUNDERS, MD  Active   glipiZIDE  (GLUCOTROL ) 10 MG tablet [id number] Yes Take 1 tablet (10 mg total) by mouth 2 (two) times daily before a meal. Levora Reyes SAUNDERS, MD  Active   insulin  glargine (LANTUS ) 100 UNIT/ML Solostar Pen [id number] Yes Inject 12 Units into the skin daily. Levora Reyes SAUNDERS, MD  Active   Insulin  Pen Needle (UNIFINE PENTIPS) 32G X 4 MM MISC [id number] Yes Use daily as directed Levora Reyes SAUNDERS, MD  Active   losartan  (COZAAR ) 100 MG tablet [id number] Yes Take 1 tablet (100 mg total) by mouth daily. Levora Reyes SAUNDERS, MD  Active   metFORMIN  (GLUCOPHAGE ) 850 MG tablet [id number] Yes Take 1 tablet (850 mg total) by mouth 2 (two) times daily with a meal. Levora Reyes SAUNDERS, MD  Active   NIFEdipine  (PROCARDIA  XL/NIFEDICAL-XL) 90 MG 24 hr tablet [id number] Yes Take 1 tablet (90 mg total) by mouth daily. Levora Reyes SAUNDERS, MD  Active   potassium chloride  (KLOR-CON  M) 10 MEQ tablet [id number] Yes Take 2 tablets (20 mEq total) by mouth 2 (two) times daily. Levora Reyes SAUNDERS, MD  Active              Assessment/Plan:  Medication Management:  - reviewed each medication with patient. Needed the following medications refilled - Trulicity , atorvastatin , aspirin , losartan , potassium and pen needles - requested from his pharmacy.   Diabetes:Currently uncontrolled  but per Continuous Glucose Monitor report blood glucose has improved over the last 7 to 10 days but he now is having more hypoglycemia events.   - Continue Lantus  12 units daily. - Continue Trulicity  to 1.5mg  weekly. Consider increasing to 3mg  weekly and lowering dose of Lantus  more. He will see Dr Levora 06/24/2024 to consider dose changes.  - Continue glipizide   10mg  and metformin  850mg  - Continue to use Libre 3 + sensors. He will look into getting a new phone so that he can use the DexCom G6 or G7 app since that is the Continuous Glucose Monitor system that his insurance prefers.   Hyperlipidemia/ASCVD Risk Reduction - Continue atorvastatin  40mg  daily  - Continue ezetimibe  10mg  daily.    Hypertension- blood pressure at goal today:  - Continue losartan  and nifedipine   Follow Up Plan: 2 to 3 weeks Appt with PCP is scheduled for 06/24/24  Madelin Ray, PharmD Clinical Pharmacist [facility]  Population Health [phone]

## 2024-06-12 ENCOUNTER — Other Ambulatory Visit: Payer: Self-pay

## 2024-06-12 MED ORDER — ATORVASTATIN CALCIUM 40 MG PO TABS
40.0000 mg | ORAL_TABLET | Freq: Every day | ORAL | 0 refills | Status: DC
Start: 2024-06-12 — End: 2024-09-02
  Filled 2024-06-12: qty 90, 90d supply, fill #0

## 2024-06-12 MED ORDER — TRULICITY 1.5 MG/0.5ML ~~LOC~~ SOAJ
1.5000 mg | SUBCUTANEOUS | 1 refills | Status: DC
  Filled 2024-06-12: qty 2, 28d supply, fill #0

## 2024-06-12 MED ORDER — LOSARTAN POTASSIUM 100 MG PO TABS
100.0000 mg | ORAL_TABLET | Freq: Every day | ORAL | 0 refills | Status: DC
  Filled 2024-06-12: qty 90, 90d supply, fill #0

## 2024-06-24 ENCOUNTER — Encounter: Payer: Self-pay | Admitting: Family Medicine

## 2024-06-24 ENCOUNTER — Other Ambulatory Visit (HOSPITAL_COMMUNITY): Payer: Self-pay

## 2024-06-24 ENCOUNTER — Ambulatory Visit (INDEPENDENT_AMBULATORY_CARE_PROVIDER_SITE_OTHER): Admitting: Family Medicine

## 2024-06-24 VITALS — BP 132/78 | HR 74 | Temp 97.8°F | Ht 72.0 in | Wt 230.8 lb

## 2024-06-24 DIAGNOSIS — E876 Hypokalemia: Secondary | ICD-10-CM | POA: Diagnosis not present

## 2024-06-24 DIAGNOSIS — Z7985 Long-term (current) use of injectable non-insulin antidiabetic drugs: Secondary | ICD-10-CM

## 2024-06-24 DIAGNOSIS — D649 Anemia, unspecified: Secondary | ICD-10-CM | POA: Diagnosis not present

## 2024-06-24 DIAGNOSIS — E1165 Type 2 diabetes mellitus with hyperglycemia: Secondary | ICD-10-CM | POA: Diagnosis not present

## 2024-06-24 DIAGNOSIS — Z7984 Long term (current) use of oral hypoglycemic drugs: Secondary | ICD-10-CM

## 2024-06-24 LAB — CBC
HCT: 36.6 % — ABNORMAL LOW (ref 39.0–52.0)
Hemoglobin: 12.8 g/dL — ABNORMAL LOW (ref 13.0–17.0)
MCHC: 35.1 g/dL (ref 30.0–36.0)
MCV: 86.7 fl (ref 78.0–100.0)
Platelets: 205 K/uL (ref 150.0–400.0)
RBC: 4.22 Mil/uL (ref 4.22–5.81)
RDW: 13.4 % (ref 11.5–15.5)
WBC: 6.4 K/uL (ref 4.0–10.5)

## 2024-06-24 LAB — MICROALBUMIN / CREATININE URINE RATIO
Creatinine,U: 67.3 mg/dL
Microalb Creat Ratio: 23.9 mg/g (ref 0.0–30.0)
Microalb, Ur: 1.6 mg/dL (ref 0.0–1.9)

## 2024-06-24 LAB — COMPREHENSIVE METABOLIC PANEL WITH GFR
ALT: 17 U/L (ref 0–53)
AST: 26 U/L (ref 0–37)
Albumin: 4.4 g/dL (ref 3.5–5.2)
Alkaline Phosphatase: 55 U/L (ref 39–117)
BUN: 28 mg/dL — ABNORMAL HIGH (ref 6–23)
CO2: 28 meq/L (ref 19–32)
Calcium: 9.5 mg/dL (ref 8.4–10.5)
Chloride: 100 meq/L (ref 96–112)
Creatinine, Ser: 1.42 mg/dL (ref 0.40–1.50)
GFR: 54.36 mL/min — ABNORMAL LOW (ref >60.00)
Glucose, Bld: 102 mg/dL — ABNORMAL HIGH (ref 70–99)
Potassium: 3 meq/L — ABNORMAL LOW (ref 3.5–5.1)
Sodium: 139 meq/L (ref 135–145)
Total Bilirubin: 0.5 mg/dL (ref 0.2–1.2)
Total Protein: 7.8 g/dL (ref 6.0–8.3)

## 2024-06-24 MED ORDER — TRULICITY 3 MG/0.5ML ~~LOC~~ SOAJ
3.0000 mg | SUBCUTANEOUS | 2 refills | Status: DC
  Filled 2024-06-24 – 2024-07-19 (×2): qty 2, 28d supply, fill #0
  Filled 2024-08-13: qty 2, 28d supply, fill #1

## 2024-06-24 NOTE — Patient Instructions (Addendum)
 Decrease lantus  to 6 units for now, and start higher trulicity  dose at 3mg  tomorrow. This combo should help with less low blood sugars and better control of blood sugars after meals. Let me know if any questions.  I will check other labs as well.  Recheck in 2 weeks - sooner if any new symptoms or more low blood sugars.

## 2024-06-24 NOTE — Progress Notes (Signed)
 Subjective:  Patient ID: Dylan Abbott, male    DOB: [birth year]  Age: 58 y.o. MRN: [id number]  CC:  Chief Complaint  Patient presents with   Medical Management of Chronic Issues    1 Month follow up    HPI [name] presents for   Diabetes: Complicated by hyperglycemia, CVD with prior suspected TIA. He has also been followed by clinical pharmacist, appreciate the assistance.  Some lows but postprandial highs noted on his CGM at last visit.  Had been on 1.5 mg Trulicity  and Lantus  16 units.  Recommended lowering dose of Lantus  to 12 units to prevent lows, with option to increase Trulicity  to 3 mg weekly, eventual goal of tapering Lantus  further and possibly tapering off Lantus .  He was most recently seen by pharmacist on June 12, 2011 units Lantus  at that time along with Trulicity  1.5 mg weekly, metformin  850 mg twice daily.  Average glucose on CGM 167, 23% between 181 and 250, 4% lows between 54 and 69.  Only 1 low blood glucose after lowering Lantus  to 12 units.  He continues on metformin  ER 50 mg twice daily and glipizide  10 mg BID.  He is on statin with atorvastatin  40 mg daily and ezetimibe  10 mg daily for hyperlipidemia, losartan  and nifedipine  for hypertension. Home readings unknown - download with pharmacist as above. Shaky feeling sometimes when feeling low.possibly 3 days ago.  CGM on phone: 112 currently.  7 day trend: Lows - 4 in past 7 days. Drinks soda. Recovers quickly.  70-180 - 74% 181-250 16% >250 7% 54-69 3%  Still on lantus  12 units. No other med changes.  Microalbumin: today.  Optho, foot exam, pneumovax:   Lab Results  Component Value Date   HGBA1C 11.4 (H) 05/20/2024   HGBA1C 8.6 (H) 02/08/2024   HGBA1C 11.3 (H) 11/06/2023   Lab Results  Component Value Date   LDLCALC 25 02/08/2024   CREATININE 1.28 05/20/2024   Fatigue  with history of low hemoglobin, prior history of hypokalemia with medication adjustment for potassium supplement. Most  recent visit June 2.  Had described some fatigue since May.  Part of fatigue found to be due to variable diabetic control.  However also had a lower hemoglobin on most recent CBC and some persistent hypokalemia.  There have been some medication nonadherence on his potassium supplement and other meds.  Fatigue was improving at his June 2 visit, and was taking potassium 30 mEq twice daily.  Denied melena hematochezia or abdominal pain.  Negative Cologuard in 04/12/2024. Borderline hypokalemia of 3.4 on June 2, additional 10 mill equivalents per day recommended in addition to what he had been taking as above.  Repeat testing planned today.  Currently taking 30meq BID potassium. No melena/hematochezia.  Denies new fatigue. 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    Lab Results  Component Value Date   NA 135 05/20/2024   K 3.4 (L) 05/20/2024   CL 98 05/20/2024   CO2 29 05/20/2024   Lab Results  Component Value Date   WBC 7.5 05/20/2024   HGB 12.8 (L) 05/20/2024   HCT 36.0 (L) 05/20/2024   MCV 87.0 05/20/2024   PLT 192.0 05/20/2024      History Patient Active Problem List   Diagnosis Date Noted   NSTEMI (non-ST elevated myocardial infarction) (HCC) 07/16/2023   Impingement syndrome of left shoulder 07/30/2020   COVID-19 virus infection 10/31/2019   Abnormal liver function 10/31/2019  Hypokalemia 10/31/2019   Hyponatremia 10/31/2019   Diabetes mellitus (HCC) 11/13/2017   Hyperlipidemia 11/13/2017   Hypertensive disorder 11/13/2017   Past Medical History:  Diagnosis Date   Diabetes mellitus without complication (HCC)    Hyperlipidemia    Hypertension    Past Surgical History:  Procedure Laterality Date   LOOP RECORDER INSERTION N/A 07/18/2023   Procedure: LOOP RECORDER INSERTION;  Surgeon: Lesia Ozell Barter, PA-C;  Location: [facility]VASIVE [facility];  Service: Cardiovascular;  Laterality: N/A;   No Known Allergies Prior to  Admission medications   Medication Sig Start Date End Date Taking? Authorizing Provider  aspirin  EC 81 MG tablet Take 1 tablet (81 mg total) by mouth daily. 12/07/23  Yes Levora Reyes SAUNDERS, MD  atorvastatin  (LIPITOR) 40 MG tablet Take 1 tablet (40 mg total) by mouth at bedtime. 06/12/24  Yes Levora Reyes SAUNDERS, MD  Dulaglutide  (TRULICITY ) 1.5 MG/0.5ML SOAJ Inject 1.5 mg into the skin every 7 (seven) days. 06/12/24  Yes Levora Reyes SAUNDERS, MD  ezetimibe  (ZETIA ) 10 MG tablet Take 1 tablet (10 mg total) by mouth daily. 05/17/24  Yes Levora Reyes SAUNDERS, MD  glipiZIDE  (GLUCOTROL ) 10 MG tablet Take 1 tablet (10 mg total) by mouth 2 (two) times daily before a meal. 02/01/24  Yes Levora Reyes SAUNDERS, MD  insulin  glargine (LANTUS ) 100 UNIT/ML Solostar Pen Inject 12 Units into the skin daily. 09/13/23  Yes Levora Reyes SAUNDERS, MD  Insulin  Pen Needle (UNIFINE PENTIPS) 32G X 4 MM MISC Use daily as directed 08/03/23  Yes Levora Reyes SAUNDERS, MD  losartan  (COZAAR ) 100 MG tablet Take 1 tablet (100 mg total) by mouth daily. 06/12/24  Yes Levora Reyes SAUNDERS, MD  metFORMIN  (GLUCOPHAGE ) 850 MG tablet Take 1 tablet (850 mg total) by mouth 2 (two) times daily with a meal. 02/01/24  Yes Levora Reyes SAUNDERS, MD  NIFEdipine  (PROCARDIA  XL/NIFEDICAL-XL) 90 MG 24 hr tablet Take 1 tablet (90 mg total) by mouth daily. 05/17/24  Yes Levora Reyes SAUNDERS, MD  potassium chloride  (KLOR-CON  M) 10 MEQ tablet Take 2 tablets (20 mEq total) by mouth 2 (two) times daily. 03/29/24  Yes Levora Reyes SAUNDERS, MD  fluticasone  (FLONASE ) 50 MCG/ACT nasal spray Place 1-2 sprays into both nostrils daily. Patient not taking: Reported on 06/24/2024 03/01/24   Levora Reyes SAUNDERS, MD   Social History   Socioeconomic History   Marital status: Married    Spouse name: Not on file   Number of children: 2   Years of education: Not on file   Highest education level: Not on file  Occupational History   Occupation: environmental  Tobacco Use   Smoking status: Never   Smokeless  tobacco: Never  Substance and Sexual Activity   Alcohol use: Never    Alcohol/week: 0.0 standard drinks of alcohol   Drug use: Never   Sexual activity: Yes  Other Topics Concern   Not on file  Social History Narrative   Not on file   Social Drivers of Health   Financial Resource Strain: Low Risk  (05/20/2024)   Overall Financial Resource Strain (CARDIA)    Difficulty of Paying Living Expenses: Not hard at all  Food Insecurity: No Food Insecurity (07/17/2023)   Hunger Vital Sign    Worried About Running Out of Food in the Last Year: Never true    Ran Out of Food in the Last Year: Never true  Transportation Needs: No Transportation Needs (07/17/2023)   PRAPARE - Transportation    Lack of Transportation (  Medical): No    Lack of Transportation (Non-Medical): No  Physical Activity: Inactive (05/20/2024)   Exercise Vital Sign    Days of Exercise per Week: 0 days    Minutes of Exercise per Session: 0 min  Stress: No Stress Concern Present (05/20/2024)   [employer] of Occupational Health - Occupational Stress Questionnaire    Feeling of Stress : Not at all  Social Connections: Not on file  Intimate Partner Violence: Not At Risk (07/17/2023)   Humiliation, Afraid, Rape, and Kick questionnaire    Fear of Current or Ex-Partner: No    Emotionally Abused: No    Physically Abused: No    Sexually Abused: No    Review of Systems  Constitutional:  Negative for fatigue and unexpected weight change.  Eyes:  Positive for visual disturbance (minimal foggy vision at times -not new. had at eye exam.).  Respiratory:  Negative for cough, chest tightness and shortness of breath.   Cardiovascular:  Negative for chest pain, palpitations and leg swelling.  Gastrointestinal:  Negative for abdominal pain and blood in stool.  Neurological:  Negative for dizziness, light-headedness and headaches.     Objective:   Vitals:   06/24/24 0903  BP: 132/78  Pulse: 74  Temp: 97.8 F (36.6 C)  SpO2: 97%   Weight: 230 lb 12.8 oz (104.7 kg)  Height: 6' (1.829 m)     Physical Exam Vitals reviewed.  Constitutional:      Appearance: He is well-developed.  HENT:     Head: Normocephalic and atraumatic.  Neck:     Vascular: No carotid bruit or JVD.  Cardiovascular:     Rate and Rhythm: Normal rate and regular rhythm.     Heart sounds: Normal heart sounds. No murmur heard. Pulmonary:     Effort: Pulmonary effort is normal.     Breath sounds: Normal breath sounds. No rales.  Musculoskeletal:     Right lower leg: No edema.     Left lower leg: No edema.  Skin:    General: Skin is warm and dry.  Neurological:     Mental Status: He is alert and oriented to person, place, and time.  Psychiatric:        Mood and Affect: Mood normal.        Assessment & Plan:  Sutton Hirsch is a 58 y.o. male . Type 2 diabetes mellitus with hyperglycemia, without long-term current use of insulin  (HCC) - Plan: Comprehensive metabolic panel with GFR, Microalbumin / creatinine urine ratio, Dulaglutide  (TRULICITY ) 3 MG/0.5ML SOAJ  - Uncontrolled by last A1c, part of decreased control due to medication adherence.  Improving readings as above, postprandial elevations previously, some lows.  Will transition to higher dose of Trulicity , back off Lantus  insulin  to 6 units for now with goal of tapering off Lantus  altogether.  No change in glipizide , metformin  for now.  Hypoglycemia precautions given.  2-week follow-up.  Low hemoglobin - Plan: CBC, Iron, TIBC and Ferritin Panel  - Fatigue has improved, check updated CBC and iron testing.  Hypokalemia - Plan: Comprehensive metabolic panel with GFR  - Borderline control previously, has not added any additional doses.  Still at 30 mEq twice daily.  Check updated labs and adjust regimen accordingly  Meds ordered this encounter  Medications   Dulaglutide  (TRULICITY ) 3 MG/0.5ML SOAJ    Sig: Inject 3 mg as directed once a week.    Dispense:  2 mL    Refill:  2    Patient Instructions  Decrease lantus  to 6 units for now, and start higher trulicity  dose at 3mg  tomorrow. This combo should help with less low blood sugars and better control of blood sugars after meals. Let me know if any questions.  I will check other labs as well.  Recheck in 2 weeks - sooner if any new symptoms or more low blood sugars.     Signed,   Reyes Pines, MD [facility], [facility] 06/24/24 10:01 AM

## 2024-06-25 ENCOUNTER — Other Ambulatory Visit: Payer: Self-pay

## 2024-06-25 ENCOUNTER — Ambulatory Visit: Payer: Self-pay | Admitting: Family Medicine

## 2024-06-25 ENCOUNTER — Other Ambulatory Visit (HOSPITAL_COMMUNITY): Payer: Self-pay

## 2024-06-25 DIAGNOSIS — E876 Hypokalemia: Secondary | ICD-10-CM

## 2024-06-26 ENCOUNTER — Other Ambulatory Visit (INDEPENDENT_AMBULATORY_CARE_PROVIDER_SITE_OTHER)

## 2024-06-26 ENCOUNTER — Ambulatory Visit: Payer: Self-pay | Admitting: Family Medicine

## 2024-06-26 DIAGNOSIS — E876 Hypokalemia: Secondary | ICD-10-CM | POA: Diagnosis not present

## 2024-06-26 LAB — BASIC METABOLIC PANEL WITH GFR
BUN: 15 mg/dL (ref 6–23)
CO2: 31 meq/L (ref 19–32)
Calcium: 8.8 mg/dL (ref 8.4–10.5)
Chloride: 100 meq/L (ref 96–112)
Creatinine, Ser: 1.06 mg/dL (ref 0.40–1.50)
GFR: 77.21 mL/min (ref >60.00)
Glucose, Bld: 132 mg/dL — ABNORMAL HIGH (ref 70–99)
Potassium: 3.1 meq/L — ABNORMAL LOW (ref 3.5–5.1)
Sodium: 140 meq/L (ref 135–145)

## 2024-06-26 LAB — IRON,TIBC AND FERRITIN PANEL
%SAT: 28 % (ref 20–48)
Ferritin: 181 ng/mL (ref 38–380)
Iron: 75 ug/dL (ref 50–180)
TIBC: 272 ug/dL (ref 250–425)

## 2024-06-26 NOTE — Telephone Encounter (Signed)
 Noted.  Lets have him repeat that as a lab only visit today if possible either here or [place], as I have seen some increase in hypokalemia on patient's recently, then normalizes on repeat testing.  Lets make sure this test was accurate.

## 2024-06-27 ENCOUNTER — Ambulatory Visit (INDEPENDENT_AMBULATORY_CARE_PROVIDER_SITE_OTHER): Payer: Self-pay

## 2024-06-27 DIAGNOSIS — I639 Cerebral infarction, unspecified: Secondary | ICD-10-CM | POA: Diagnosis not present

## 2024-06-27 LAB — CUP PACEART REMOTE DEVICE CHECK
Date Time Interrogation Session: 20250709234244
Implantable Pulse Generator Implant Date: 20240730

## 2024-06-29 ENCOUNTER — Ambulatory Visit: Payer: Self-pay | Admitting: Cardiology

## 2024-07-08 ENCOUNTER — Encounter: Payer: Self-pay | Admitting: Family Medicine

## 2024-07-08 ENCOUNTER — Other Ambulatory Visit: Payer: Self-pay

## 2024-07-08 ENCOUNTER — Other Ambulatory Visit (HOSPITAL_COMMUNITY): Payer: Self-pay

## 2024-07-08 ENCOUNTER — Ambulatory Visit (INDEPENDENT_AMBULATORY_CARE_PROVIDER_SITE_OTHER): Admitting: Family Medicine

## 2024-07-08 VITALS — BP 138/78 | HR 70 | Temp 98.5°F | Ht 72.0 in | Wt 233.0 lb

## 2024-07-08 DIAGNOSIS — Z95818 Presence of other cardiac implants and grafts: Secondary | ICD-10-CM

## 2024-07-08 DIAGNOSIS — E876 Hypokalemia: Secondary | ICD-10-CM

## 2024-07-08 DIAGNOSIS — E1165 Type 2 diabetes mellitus with hyperglycemia: Secondary | ICD-10-CM | POA: Diagnosis not present

## 2024-07-08 DIAGNOSIS — Z7985 Long-term (current) use of injectable non-insulin antidiabetic drugs: Secondary | ICD-10-CM

## 2024-07-08 DIAGNOSIS — Z7984 Long term (current) use of oral hypoglycemic drugs: Secondary | ICD-10-CM

## 2024-07-08 LAB — BASIC METABOLIC PANEL WITH GFR
BUN: 22 mg/dL (ref 6–23)
CO2: 30 meq/L (ref 19–32)
Calcium: 9.4 mg/dL (ref 8.4–10.5)
Chloride: 97 meq/L (ref 96–112)
Creatinine, Ser: 1.43 mg/dL (ref 0.40–1.50)
GFR: 53.89 mL/min — ABNORMAL LOW (ref >60.00)
Glucose, Bld: 162 mg/dL — ABNORMAL HIGH (ref 70–99)
Potassium: 3.2 meq/L — ABNORMAL LOW (ref 3.5–5.1)
Sodium: 136 meq/L (ref 135–145)

## 2024-07-08 MED ORDER — FREESTYLE LIBRE 3 SENSOR MISC
1.0000 | 2 refills | Status: AC
Start: 2024-07-08 — End: ?
  Filled 2024-07-08: qty 2, 28d supply, fill #0

## 2024-07-08 NOTE — Patient Instructions (Signed)
 Increase lantus  by 2 units for dose of 8 units per day. Stay on 3 mg trulicity  and same doses of other meds. Freestyle libre may be $75 per month out of pocket if not covered by insurance.  Recheck in 2 weeks.   Call eye doctor for appointment to discuss vision concerns, but as diabetes control improves vision may improve, but may need glasses for now.   Return to the clinic or go to the nearest emergency room if any of your symptoms worsen or new symptoms occur.

## 2024-07-08 NOTE — Progress Notes (Signed)
 Subjective:  Patient ID: Dylan Abbott, male    DOB: 1966/04/02  Age: 58 y.o. MRN: [id number]  CC:  Chief Complaint  Patient presents with   Follow-up    2wks med check and labs; No concerns    HPI Dylan Abbott presents for   Diabetes: Complicated by hyperglycemia, CVD with prior suspected TIA.  Has also been followed by clinical pharmacist.  Treated with Lantus , Trulicity , metformin .  Lantus  has been decreased to 12 units from 16 units due to some lows prior, continued on metformin  extended release 850 mg twice daily, glipizide  10 mg twice per day, and Trulicity  1.5 mg weekly.  He is on statin with atorvastatin  40 mg daily, ezetimibe  10 mg daily for hyperlipidemia, losartan  and nifedipine  for hypertension.  Needs more CGMs.   Possible intermittent lows discussed last visit, for in the past 1 week on his CGM.  112 in office on that visit.  7-day trend to 74% within 70-180, 16% 181-250 at that time.  Still some difficulty vision. No recent changes. No darkening. Last optho eval in 10/2023. Had glasses Rx - did not get - ? Question of insurance coverage.   Due to persistent lows I decreased his Lantus  to 60 units and increased his Trulicity  to 3 mg weekly. Has been using above meds. No missed doses.   CGM data: 171 currently.  7 day range: 26% >250,  29% 181-250 45% 701-180 One low past week. Not feeling lows.   Lab Results  Component Value Date   HGBA1C 11.4 (H) 05/20/2024   HGBA1C 8.6 (H) 02/08/2024   HGBA1C 11.3 (H) 11/06/2023   Lab Results  Component Value Date   MICROALBUR 1.6 06/24/2024   LDLCALC 25 02/08/2024   CREATININE 1.06 06/26/2024    Hypokalemia On 30 mill equivalents twice daily potassium as of his July 7 visit.  Denied new fatigue at that time, tired after working at night Works night shift.  Physical work. Recommended increase to 40 mill equivalents twice daily due to low potassium on repeat testing. Taking 40meq BID, no palpitations, no new  myalgias.   Lab Results  Component Value Date   NA 140 06/26/2024   K 3.1 (L) 06/26/2024   CL 100 06/26/2024   CO2 31 06/26/2024   Additional concern: Loop recorder in place - pinching sensation. Would like ot discuss hacving that removed.  EP - Dr. Cindie. Interrogation review noted 7/12 - no arrhythmias.    History Patient Active Problem List   Diagnosis Date Noted   NSTEMI (non-ST elevated myocardial infarction) (HCC) 07/16/2023   Impingement syndrome of left shoulder 07/30/2020   COVID-19 virus infection 10/31/2019   Abnormal liver function 10/31/2019   Hypokalemia 10/31/2019   Hyponatremia 10/31/2019   Diabetes mellitus (HCC) 11/13/2017   Hyperlipidemia 11/13/2017   Hypertensive disorder 11/13/2017   Past Medical History:  Diagnosis Date   Diabetes mellitus without complication (HCC)    Hyperlipidemia    Hypertension    Past Surgical History:  Procedure Laterality Date   LOOP RECORDER INSERTION N/A 07/18/2023   Procedure: LOOP RECORDER INSERTION;  Surgeon: Lesia Ozell Barter, PA-C;  Location: [facility];  Service: Cardiovascular;  Laterality: N/A;   No Known Allergies Prior to Admission medications   Medication Sig Start Date End Date Taking? Authorizing Provider  aspirin  EC 81 MG tablet Take 1 tablet (81 mg total) by mouth daily. 12/07/23  Yes Levora Reyes SAUNDERS, MD  atorvastatin  (LIPITOR) 40 MG tablet Take 1 tablet (40  mg total) by mouth at bedtime. 06/12/24  Yes Levora Reyes SAUNDERS, MD  Dulaglutide  (TRULICITY ) 3 MG/0.5ML SOAJ Inject 3 mg as directed once a week. 06/24/24  Yes Levora Reyes SAUNDERS, MD  ezetimibe  (ZETIA ) 10 MG tablet Take 1 tablet (10 mg total) by mouth daily. 05/17/24  Yes Levora Reyes SAUNDERS, MD  glipiZIDE  (GLUCOTROL ) 10 MG tablet Take 1 tablet (10 mg total) by mouth 2 (two) times daily before a meal. 02/01/24  Yes Levora Reyes SAUNDERS, MD  insulin  glargine (LANTUS ) 100 UNIT/ML Solostar Pen Inject 12 Units into the skin daily. 09/13/23  Yes Levora Reyes SAUNDERS, MD  Insulin  Pen Needle (UNIFINE PENTIPS) 32G X 4 MM MISC Use daily as directed 08/03/23  Yes Levora Reyes SAUNDERS, MD  losartan  (COZAAR ) 100 MG tablet Take 1 tablet (100 mg total) by mouth daily. 06/12/24  Yes Levora Reyes SAUNDERS, MD  metFORMIN  (GLUCOPHAGE ) 850 MG tablet Take 1 tablet (850 mg total) by mouth 2 (two) times daily with a meal. 02/01/24  Yes Levora Reyes SAUNDERS, MD  NIFEdipine  (PROCARDIA  XL/NIFEDICAL-XL) 90 MG 24 hr tablet Take 1 tablet (90 mg total) by mouth daily. 05/17/24  Yes Levora Reyes SAUNDERS, MD  potassium chloride  (KLOR-CON  M) 10 MEQ tablet Take 2 tablets (20 mEq total) by mouth 2 (two) times daily. 03/29/24  Yes Levora Reyes SAUNDERS, MD  fluticasone  (FLONASE ) 50 MCG/ACT nasal spray Place 1-2 sprays into both nostrils daily. Patient not taking: Reported on 07/08/2024 03/01/24   Levora Reyes SAUNDERS, MD   Social History   Socioeconomic History   Marital status: Married    Spouse name: Not on file   Number of children: 2   Years of education: Not on file   Highest education level: Not on file  Occupational History   Occupation: environmental  Tobacco Use   Smoking status: Never   Smokeless tobacco: Never  Substance and Sexual Activity   Alcohol use: Never    Alcohol/week: 0.0 standard drinks of alcohol   Drug use: Never   Sexual activity: Yes  Other Topics Concern   Not on file  Social History Narrative   Not on file   Social Drivers of Health   Financial Resource Strain: Low Risk  (05/20/2024)   Overall Financial Resource Strain (CARDIA)    Difficulty of Paying Living Expenses: Not hard at all  Food Insecurity: No Food Insecurity (07/17/2023)   Hunger Vital Sign    Worried About Running Out of Food in the Last Year: Never true    Ran Out of Food in the Last Year: Never true  Transportation Needs: No Transportation Needs (07/17/2023)   PRAPARE - Administrator, Civil Service (Medical): No    Lack of Transportation (Non-Medical): No  Physical Activity:  Inactive (05/20/2024)   Exercise Vital Sign    Days of Exercise per Week: 0 days    Minutes of Exercise per Session: 0 min  Stress: No Stress Concern Present (05/20/2024)   [employer] of Occupational Health - Occupational Stress Questionnaire    Feeling of Stress : Not at all  Social Connections: Not on file  Intimate Partner Violence: Not At Risk (07/17/2023)   Humiliation, Afraid, Rape, and Kick questionnaire    Fear of Current or Ex-Partner: No    Emotionally Abused: No    Physically Abused: No    Sexually Abused: No    Review of Systems  Constitutional:  Negative for fatigue and unexpected weight change.  Eyes:  Positive for visual  disturbance.  Respiratory:  Negative for cough, chest tightness and shortness of breath.   Cardiovascular:  Negative for chest pain, palpitations and leg swelling.  Gastrointestinal:  Negative for abdominal pain and blood in stool.  Neurological:  Negative for dizziness, light-headedness and headaches.   Objective:   Vitals:   07/08/24 0859  BP: 138/78  Pulse: 70  Temp: 98.5 F (36.9 C)  SpO2: 99%  Weight: 233 lb (105.7 kg)  Height: 6' (1.829 m)     Physical Exam Vitals reviewed.  Constitutional:      Appearance: He is well-developed.  HENT:     Head: Normocephalic and atraumatic.  Neck:     Vascular: No carotid bruit or JVD.  Cardiovascular:     Rate and Rhythm: Normal rate and regular rhythm.     Heart sounds: Normal heart sounds. No murmur heard. Pulmonary:     Effort: Pulmonary effort is normal.     Breath sounds: Normal breath sounds. No rales.  Musculoskeletal:     Right lower leg: No edema.     Left lower leg: No edema.  Skin:    General: Skin is warm and dry.  Neurological:     Mental Status: He is alert and oriented to person, place, and time.  Psychiatric:        Mood and Affect: Mood normal.        Assessment & Plan:  Dylan Abbott is a 58 y.o. male . Hypokalemia - Plan: Basic metabolic panel with  GFR  - Now on 40 mill equivalents twice daily, check updated labs and adjust dose accordingly.  Type 2 diabetes mellitus with hyperglycemia, without long-term current use of insulin  (HCC) - Plan: Basic metabolic panel with GFR, Continuous Glucose Sensor (FREESTYLE LIBRE 3 SENSOR) MISC  - Less lows on higher dose Trulicity , lower dose Lantus  but overall readings have increased.  Will increase Lantus  slightly by 2 units for total dose of 8 units/day, continue Trulicity  3 mg weekly, no other med changes.  Prescription for freestyle libre, unfortunately has not been covered by insurance, Dexcom is but does not work with his phone.  Co-pay card given and advised possible out-of-pocket cost for freestyle libre which he may be able to do.  New prescription given, 2-week follow-up.  Implantable loop recorder present - Plan: Ambulatory referral to Cardiac Electrophysiology  - Reports some discomfort, pinching with monitor would like to discuss having that removed.  Will refer to electrophysiology to discuss further.  Meds ordered this encounter  Medications   Continuous Glucose Sensor (FREESTYLE LIBRE 3 SENSOR) MISC    Sig: 1 Application by Does not apply route every 14 (fourteen) days. Place 1 sensor on the skin every 14 days. Use to check glucose continuously    Dispense:  2 each    Refill:  2    Out of pocket option if needed.   Patient Instructions  Increase lantus  by 2 units for dose of 8 units per day. Stay on 3 mg trulicity  and same doses of other meds. Freestyle libre may be $75 per month out of pocket if not covered by insurance.  Recheck in 2 weeks.   Call eye doctor for appointment to discuss vision concerns, but as diabetes control improves vision may improve, but may need glasses for now.   Return to the clinic or go to the nearest emergency room if any of your symptoms worsen or new symptoms occur.     Signed,   Reyes Pines, MD [place]  [facility], [facility] 07/08/24 10:04 AM

## 2024-07-09 ENCOUNTER — Ambulatory Visit: Payer: Self-pay | Admitting: Family Medicine

## 2024-07-09 ENCOUNTER — Other Ambulatory Visit: Payer: Self-pay

## 2024-07-09 DIAGNOSIS — I10 Essential (primary) hypertension: Secondary | ICD-10-CM

## 2024-07-09 DIAGNOSIS — E1165 Type 2 diabetes mellitus with hyperglycemia: Secondary | ICD-10-CM

## 2024-07-09 DIAGNOSIS — E876 Hypokalemia: Secondary | ICD-10-CM

## 2024-07-19 ENCOUNTER — Other Ambulatory Visit: Payer: Self-pay | Admitting: Family Medicine

## 2024-07-19 ENCOUNTER — Other Ambulatory Visit: Payer: Self-pay

## 2024-07-19 ENCOUNTER — Other Ambulatory Visit (HOSPITAL_COMMUNITY): Payer: Self-pay

## 2024-07-19 DIAGNOSIS — E876 Hypokalemia: Secondary | ICD-10-CM

## 2024-07-19 MED ORDER — POTASSIUM CHLORIDE CRYS ER 10 MEQ PO TBCR
20.0000 meq | EXTENDED_RELEASE_TABLET | Freq: Two times a day (BID) | ORAL | 1 refills | Status: DC
  Filled 2024-07-19: qty 120, 30d supply, fill #0

## 2024-07-19 MED ORDER — ASPIRIN 81 MG PO TBEC
81.0000 mg | DELAYED_RELEASE_TABLET | Freq: Every day | ORAL | 0 refills | Status: DC
  Filled 2024-07-19 – 2024-08-13 (×2): qty 120, 120d supply, fill #0

## 2024-07-19 NOTE — Progress Notes (Signed)
 07/19/2024 Name: Dylan Abbott MRN: [id number] DOB: 1966-09-08  Chief Complaint  Patient presents with   Diabetes   Medication Management    Frazier Balfour is a 58 y.o. year old male who presented for a phone visit with the Clinical Pharmacist today    They were referred to the pharmacist by their PCP for assistance in managing diabetes, medication access, and complex medication management.    Subjective:  Medication Access/Adherence  Patient has run out of [place] 3 plus sensors - last used 07/14/2024.   We have tried to get prior authorization for the [facility] but was initial prior authorization and appeal were both denied.  (number [id number] for [employer] / [employer] [facility] services)  His plan covers DexCom G6 but his phone is not compatible with their app. Also not compatible with G7 app.   Current Pharmacy:  [name] - [facility] [address] [place] [address] Phone: [phone] Fax: [phone]   Patient reports affordability concerns with their medications: Yes  - only when he has not met deductible.   Patient reports access/transportation concerns to their pharmacy: No  Patient reports adherence concerns with their medications:  No  - has had ran out of Trulicity  1.5mg  and has not started 3mg  dose that was [facility] to [facility] 06/24/2024   Diabetes:  Current medications:  Lantus  12 units daily (takes around 6pm when he leaves for work);  metformin  850mg  twice a day and glipizide  10mg  daily. ALso supposed to be on Trulicity  3mg  but he has still been taking 1.5mg  and ran out of that dose 2 weeks ago.  Exercise - none currently, has an active job.   CGM Documentation: 07/01/2024 to 07/14/2024 Days Worn: 14 (recommend 14 days) % Time CGM is active: 98% (goal >=70%) Average Glucose: 191 mg/dL (previously was 832) Glucose Management Indicator: 7.9% (previously was 7.3%) Glucose Variability: 37.0% (goal <36%) Time in Range:  - Time  above range >250: 18% (typical goal: <5%)(previously was 14%) - Time above range 181-250: 28% (typical goal <20%)(previously was 23%) - Time in range 70-180:  54%(typical goal >=70%)(previously was 59%) - Time below range 54-69: 0% (typical goal <4%)(previously was 4%) - Time below range: 0% (typical goal <1%)(previously was 0%)        Hyperlipidemia/ASCVD Risk Reduction  Current lipid lowering medications:  atorvastatin  40mg  once a day and ezetimibe  10mg  daily  Antiplatelet regimen: aspirin  81mg  daily  Hypertension:  Current therapy: nifedipine  ER 90mg  daily and losartan  100mg  daily   Does not check blood pressure at home.   BP Readings from Last 3 Encounters:  07/08/24 138/78  06/24/24 132/78  05/30/24 136/80   Patient is due for refills on aspirin  81mg , potassium supplement and Trulicity  - should be on 3mg  dose.   Objective:  Lab Results  Component Value Date   HGBA1C 11.4 (H) 05/20/2024    Lab Results  Component Value Date   CREATININE 1.43 07/08/2024   BUN 22 07/08/2024   NA 136 07/08/2024   K 3.2 (L) 07/08/2024   CL 97 07/08/2024   CO2 30 07/08/2024    Lab Results  Component Value Date   CHOL 86 02/08/2024   HDL 47.90 02/08/2024   LDLCALC 25 02/08/2024   TRIG 66.0 02/08/2024   CHOLHDL 2 02/08/2024    Medications Reviewed Today     Reviewed by Carla Milling, RPH-CPP (Pharmacist) on 07/19/24 at 1611  Med List Status: <None>   Medication Order Taking? Sig Documenting  Provider Last Dose Status Informant  aspirin  EC 81 MG tablet [id number]  Take 1 tablet (81 mg total) by mouth daily.  Patient not taking: Reported on 07/19/2024   Levora Reyes SAUNDERS, MD  Active   atorvastatin  (LIPITOR) 40 MG tablet [id number] Yes Take 1 tablet (40 mg total) by mouth at bedtime. Levora Reyes SAUNDERS, MD  Active   Continuous Glucose Sensor (FREESTYLE LIBRE 3 SENSOR) [place] [id number] Yes Place 1 sensor on the skin every 14 days. Use to check glucose continuously Levora Reyes SAUNDERS, MD   Active   Dulaglutide  (TRULICITY ) 3 MG/0.5ML EMMANUEL [id number]  Inject 3 mg as directed once a week.  Patient not taking: Reported on 07/19/2024   Levora Reyes SAUNDERS, MD  Active   ezetimibe  (ZETIA ) 10 MG tablet [id number] Yes Take 1 tablet (10 mg total) by mouth daily. Levora Reyes SAUNDERS, MD  Active   fluticasone  (FLONASE ) 50 MCG/ACT nasal spray [id number]  Place 1-2 sprays into both nostrils daily.  Patient not taking: Reported on 07/08/2024   Levora Reyes SAUNDERS, MD  Active   glipiZIDE  (GLUCOTROL ) 10 MG tablet [id number] Yes Take 1 tablet (10 mg total) by mouth 2 (two) times daily before a meal. Levora Reyes SAUNDERS, MD  Active   insulin  glargine (LANTUS ) 100 UNIT/ML Solostar Pen [id number] Yes Inject 12 Units into the skin daily. Levora Reyes SAUNDERS, MD  Active   Insulin  Pen Needle (UNIFINE PENTIPS) 32G X 4 MM MISC [id number]  Use daily as directed Levora Reyes SAUNDERS, MD  Active   losartan  (COZAAR ) 100 MG tablet [id number] Yes Take 1 tablet (100 mg total) by mouth daily. Levora Reyes SAUNDERS, MD  Active   metFORMIN  (GLUCOPHAGE ) 850 MG tablet [id number] Yes Take 1 tablet (850 mg total) by mouth 2 (two) times daily with a meal. Levora Reyes SAUNDERS, MD  Active   NIFEdipine  (PROCARDIA  XL/NIFEDICAL-XL) 90 MG 24 hr tablet [id number] Yes Take 1 tablet (90 mg total) by mouth daily. Levora Reyes SAUNDERS, MD  Active   potassium chloride  (KLOR-CON  M) 10 MEQ tablet [id number] Yes Take 2 tablets (20 mEq total) by mouth 2 (two) times daily. Levora Reyes SAUNDERS, MD  Active              Assessment/Plan:  Medication Management:  - reviewed each medication with patient. Needed the following medications refilled - Trulicity , aspirin , potassium - requested from his pharmacy. Rx's will be shipped to patient.  Diabetes:Currently uncontrolled   - Continue Lantus  12 units daily. - Start Trulicity  to 3mg  weekly dose  - Continue glipizide  10mg  and metformin  850mg  - Continue to use Libre 3 + sensors. He will look into getting a new phone so  that he can use the DexCom G6 or G7 app since that is the Continuous Glucose Monitor system that his insurance prefers.   Hyperlipidemia/ASCVD Risk Reduction - Continue atorvastatin  40mg  daily  - Continue ezetimibe  10mg  daily.    Hypertension- blood pressure at goal today:  - Continue losartan  and nifedipine   Follow Up Plan: 2 to 3 weeks Appt with PCP is scheduled for 07/24/2024  Madelin Ray, PharmD Clinical Pharmacist [facility]  Population Health [phone]

## 2024-07-22 ENCOUNTER — Other Ambulatory Visit (HOSPITAL_COMMUNITY): Payer: Self-pay

## 2024-07-24 ENCOUNTER — Encounter: Payer: Self-pay | Admitting: Family Medicine

## 2024-07-24 ENCOUNTER — Ambulatory Visit: Admitting: Family Medicine

## 2024-07-24 VITALS — BP 132/70 | HR 80 | Temp 97.9°F | Ht 72.0 in | Wt 229.8 lb

## 2024-07-24 DIAGNOSIS — H538 Other visual disturbances: Secondary | ICD-10-CM

## 2024-07-24 DIAGNOSIS — E876 Hypokalemia: Secondary | ICD-10-CM | POA: Diagnosis not present

## 2024-07-24 DIAGNOSIS — Z794 Long term (current) use of insulin: Secondary | ICD-10-CM | POA: Diagnosis not present

## 2024-07-24 DIAGNOSIS — E1165 Type 2 diabetes mellitus with hyperglycemia: Secondary | ICD-10-CM

## 2024-07-24 NOTE — Patient Instructions (Signed)
 Thank you for coming in today.  Depending on your lab results today we may adjust your potassium to a higher dose.  I should have those results back tomorrow and then I can send in a new prescription.  Since the blurry vision has not changed recently, I think it would be worth trying the prescription for glasses that was provided by ophthalmology but I understand the prescription through their office was very costly.  I would recommend trying to get the printed prescription for your glasses from your ophthalmologist and check around other locations for possibly less expensive glasses including stores like [address], [employer], [employer], [employer], or [employer].   Take care!

## 2024-07-24 NOTE — Progress Notes (Signed)
 Subjective:  Patient ID: Dylan Abbott, male    DOB: 02/21/1966  Age: 58 y.o. MRN: [id number]  CC:  Chief Complaint  Patient presents with   Medical Management of Chronic Issues    Pt is doing well,    Blurred Vision    Notes blurring of vision intermittently, denied any head or eye injury to either side. Vision exam performed and is abnormal, pt is established with Dr Octavia has been up to date on annual exams with Dr Octavia      HPI Dylan Abbott presents for   Diabetes: Complicated by hyperglycemia, CVD with prior suspected TIA.  Followed by clinical pharmacist treated with Lantus , Trulicity , metformin  with adjustment of the Lantus  dosing, due to previous lows.  Has continued on metformin  extended release 850 mg twice daily as well as glipizide  10 mg twice daily, and Trulicity .  Lantus  had previously been decreased to 6 units with increase of Trulicity  to 3 mg weekly prior to his July 21 visit.  He is on statin with Lipitor 40 mg daily, ezetimibe  10 mg daily for hyperlipidemia, ARB with losartan  and nifedipine  for hypertension. Less lows on higher dose Trulicity  at his last visit, but unfortunately some of his readings were increasing so we returned to 8 units/day of Lantus .  Since last visit - 8 units lantus  per day, trulicity  3mg  weekly - dose yesterday - usually on Sunday -  2 days late. Has not been able to check blood sugars recently - out of CGM,  but has not had feelings of low blood sugars. CGM sample given today for monitoring, placed Marks in office.   Blurry vision has been discussed previously, followed by ophthalmology and denied any acute changes or darkening of visual fields.  When discussed at his July 21 visit he had received a glasses prescription but was not able to get that prescription?  Question of insurance coverage - too expensive, but that was at the ophthalmology office - $600-800.  No change in blurry vision from prior visits - no darkening.         Lab Results  Component Value Date   HGBA1C 11.4 (H) 05/20/2024   HGBA1C 8.6 (H) 02/08/2024   HGBA1C 11.3 (H) 11/06/2023   Lab Results  Component Value Date   MICROALBUR 1.6 06/24/2024   LDLCALC 25 02/08/2024   CREATININE 1.43 07/08/2024   Hypokalemia Still low at his July 21 visit at 3.2.  Advised to take 1 additional dose in the morning of potassium each day with new dosing of 60 mill equivalents in the morning, 40 mg at night.  On discussion today - he is only taking 2 in morning and 2 at night. Thought he would run out. Has meds in office - taking potassium 10meq - 2 in am and 2 at night (20meq BID) No CP/palpitations.  Repeat labs planned today.        History Patient Active Problem List   Diagnosis Date Noted   NSTEMI (non-ST elevated myocardial infarction) (HCC) 07/16/2023   Impingement syndrome of left shoulder 07/30/2020   COVID-19 virus infection 10/31/2019   Abnormal liver function 10/31/2019   Hypokalemia 10/31/2019   Hyponatremia 10/31/2019   Diabetes mellitus (HCC) 11/13/2017   Hyperlipidemia 11/13/2017   Hypertensive disorder 11/13/2017   Past Medical History:  Diagnosis Date   Diabetes mellitus without complication (HCC)    Hyperlipidemia    Hypertension    Past Surgical History:  Procedure Laterality Date   LOOP RECORDER INSERTION N/A  07/18/2023   Procedure: LOOP RECORDER INSERTION;  Surgeon: Lesia Ozell Barter, PA-C;  Location: [facility] INVASIVE CV LAB;  Service: Cardiovascular;  Laterality: N/A;   No Known Allergies Prior to Admission medications   Medication Sig Start Date End Date Taking? Authorizing Provider  aspirin  EC 81 MG tablet Take 1 tablet (81 mg total) by mouth daily. 07/19/24   Levora Reyes SAUNDERS, MD  atorvastatin  (LIPITOR) 40 MG tablet Take 1 tablet (40 mg total) by mouth at bedtime. 06/12/24   Levora Reyes SAUNDERS, MD  Continuous Glucose Sensor (FREESTYLE LIBRE 3 SENSOR) MISC Place 1 sensor on the skin every 14 days. Use to  check glucose continuously 07/08/24   Levora Reyes SAUNDERS, MD  Dulaglutide  (TRULICITY ) 3 MG/0.5ML SOAJ Inject 3 mg as directed once a week. Patient not taking: Reported on 07/19/2024 06/24/24   Levora Reyes SAUNDERS, MD  ezetimibe  (ZETIA ) 10 MG tablet Take 1 tablet (10 mg total) by mouth daily. 05/17/24   Levora Reyes SAUNDERS, MD  fluticasone  (FLONASE ) 50 MCG/ACT nasal spray Place 1-2 sprays into both nostrils daily. Patient not taking: Reported on 07/08/2024 03/01/24   Levora Reyes SAUNDERS, MD  glipiZIDE  (GLUCOTROL ) 10 MG tablet Take 1 tablet (10 mg total) by mouth 2 (two) times daily before a meal. 02/01/24   Levora Reyes SAUNDERS, MD  insulin  glargine (LANTUS ) 100 UNIT/ML Solostar Pen Inject 12 Units into the skin daily. 09/13/23   Levora Reyes SAUNDERS, MD  Insulin  Pen Needle (UNIFINE PENTIPS) 32G X 4 MM MISC Use daily as directed 08/03/23   Levora Reyes SAUNDERS, MD  losartan  (COZAAR ) 100 MG tablet Take 1 tablet (100 mg total) by mouth daily. 06/12/24   Levora Reyes SAUNDERS, MD  metFORMIN  (GLUCOPHAGE ) 850 MG tablet Take 1 tablet (850 mg total) by mouth 2 (two) times daily with a meal. 02/01/24   Levora Reyes SAUNDERS, MD  NIFEdipine  (PROCARDIA  XL/NIFEDICAL-XL) 90 MG 24 hr tablet Take 1 tablet (90 mg total) by mouth daily. 05/17/24   Levora Reyes SAUNDERS, MD  potassium chloride  (KLOR-CON  M) 10 MEQ tablet Take 2 tablets (20 mEq total) by mouth 2 (two) times daily. 07/19/24   Levora Reyes SAUNDERS, MD   Social History   Socioeconomic History   Marital status: Married    Spouse name: Not on file   Number of children: 2   Years of education: Not on file   Highest education level: Not on file  Occupational History   Occupation: environmental  Tobacco Use   Smoking status: Never   Smokeless tobacco: Never  Substance and Sexual Activity   Alcohol use: Never    Alcohol/week: 0.0 standard drinks of alcohol   Drug use: Never   Sexual activity: Yes  Other Topics Concern   Not on file  Social History Narrative   Not on file   Social Drivers  of Health   Financial Resource Strain: Low Risk  (05/20/2024)   Overall Financial Resource Strain (CARDIA)    Difficulty of Paying Living Expenses: Not hard at all  Food Insecurity: No Food Insecurity (07/17/2023)   Hunger Vital Sign    Worried About Running Out of Food in the Last Year: Never true    Ran Out of Food in the Last Year: Never true  Transportation Needs: No Transportation Needs (07/17/2023)   PRAPARE - Administrator, Civil Service (Medical): No    Lack of Transportation (Non-Medical): No  Physical Activity: Inactive (05/20/2024)   Exercise Vital Sign    Days of Exercise  per Week: 0 days    Minutes of Exercise per Session: 0 min  Stress: No Stress Concern Present (05/20/2024)   [employer] of Occupational Health - Occupational Stress Questionnaire    Feeling of Stress : Not at all  Social Connections: Not on file  Intimate Partner Violence: Not At Risk (07/17/2023)   Humiliation, Afraid, Rape, and Kick questionnaire    Fear of Current or Ex-Partner: No    Emotionally Abused: No    Physically Abused: No    Sexually Abused: No    Review of Systems  Constitutional:  Negative for fatigue and unexpected weight change.  Eyes:  Negative for visual disturbance.  Respiratory:  Negative for cough, chest tightness and shortness of breath.   Cardiovascular:  Negative for chest pain, palpitations and leg swelling.  Gastrointestinal:  Negative for abdominal pain and blood in stool.  Neurological:  Negative for dizziness, light-headedness and headaches.     Objective:   Vitals:   07/24/24 1552  BP: 132/70  Pulse: 80  Temp: 97.9 F (36.6 C)  TempSrc: Temporal  SpO2: 99%  Weight: 229 lb 12.8 oz (104.2 kg)  Height: 6' (1.829 m)     Physical Exam Vitals reviewed.  Constitutional:      Appearance: He is well-developed.  HENT:     Head: Normocephalic and atraumatic.  Neck:     Vascular: No carotid bruit or JVD.  Cardiovascular:     Rate and Rhythm:  Normal rate and regular rhythm.     Heart sounds: Normal heart sounds. No murmur heard. Pulmonary:     Effort: Pulmonary effort is normal.     Breath sounds: Normal breath sounds. No rales.  Musculoskeletal:     Right lower leg: No edema.     Left lower leg: No edema.  Skin:    General: Skin is warm and dry.  Neurological:     Mental Status: He is alert and oriented to person, place, and time.  Psychiatric:        Mood and Affect: Mood normal.     Vision Screening   Right eye Left eye Both eyes  Without correction 20/40 20/50 20/30   With correction        Assessment & Plan:  Dylan Abbott is a 58 y.o. male . Hypokalemia - Plan: Basic metabolic panel with GFR  - Verified home doses in office with his prescription today and he has only been taking a total of 20 mill equivalents twice per day.  Some confusion regarding previous dosing recommendations.  Will recheck a BMP today  Type 2 diabetes mellitus with hyperglycemia, without long-term current use of insulin  (HCC) - Plan: Basic metabolic panel with GFR  - No recent home readings, new CGM placed today with additional sample.  Denies any hypoglycemia symptoms.  Check BMP including glucose level today, then monitor home CGM over the next few weeks.  Appointment with pharmacist on August 21.  No changes in current Lantus , Trulicity  dose for now.  Blurred vision  - Likely in part due to his uncontrolled diabetes, but also had prescription for corrective lenses that was cost prohibitive at ophthalmology office.  Recommend he obtain the printed prescription for those glasses and check with other locations such as [facility], [facility], [facility] to see if lower cost options available.  No orders of the defined types were placed in this encounter.  Patient Instructions  Thank you for coming in today.  Depending on your lab results today we  may adjust your potassium to a higher dose.  I should have those results back tomorrow and  then I can send in a new prescription.  Since the blurry vision has not changed recently, I think it would be worth trying the prescription for glasses that was provided by ophthalmology but I understand the prescription through their office was very costly.  I would recommend trying to get the printed prescription for your glasses from your ophthalmologist and check around other locations for possibly less expensive glasses including stores like [address], [employer], [employer], [facility], or [employer].   Take care!        Signed,   Reyes Pines, MD [facility], [facility] 07/24/24 4:57 PM

## 2024-07-25 ENCOUNTER — Other Ambulatory Visit (HOSPITAL_COMMUNITY): Payer: Self-pay

## 2024-07-25 ENCOUNTER — Ambulatory Visit: Payer: Self-pay | Admitting: Family Medicine

## 2024-07-25 ENCOUNTER — Other Ambulatory Visit (HOSPITAL_BASED_OUTPATIENT_CLINIC_OR_DEPARTMENT_OTHER): Payer: Self-pay

## 2024-07-25 DIAGNOSIS — E876 Hypokalemia: Secondary | ICD-10-CM

## 2024-07-25 LAB — BASIC METABOLIC PANEL WITH GFR
BUN: 20 mg/dL (ref 6–23)
CO2: 26 meq/L (ref 19–32)
Calcium: 9.2 mg/dL (ref 8.4–10.5)
Chloride: 100 meq/L (ref 96–112)
Creatinine, Ser: 1.14 mg/dL (ref 0.40–1.50)
GFR: 70.71 mL/min (ref >60.00)
Glucose, Bld: 154 mg/dL — ABNORMAL HIGH (ref 70–99)
Potassium: 3.2 meq/L — ABNORMAL LOW (ref 3.5–5.1)
Sodium: 139 meq/L (ref 135–145)

## 2024-07-25 MED ORDER — POTASSIUM CHLORIDE CRYS ER 10 MEQ PO TBCR
30.0000 meq | EXTENDED_RELEASE_TABLET | Freq: Two times a day (BID) | ORAL | 1 refills | Status: DC
  Filled 2024-07-25 – 2024-08-20 (×2): qty 180, 30d supply, fill #0

## 2024-07-29 ENCOUNTER — Ambulatory Visit (INDEPENDENT_AMBULATORY_CARE_PROVIDER_SITE_OTHER): Payer: Self-pay

## 2024-07-29 DIAGNOSIS — I639 Cerebral infarction, unspecified: Secondary | ICD-10-CM

## 2024-07-29 LAB — CUP PACEART REMOTE DEVICE CHECK
Date Time Interrogation Session: 20250809232722
Implantable Pulse Generator Implant Date: 20240730

## 2024-07-30 ENCOUNTER — Ambulatory Visit: Payer: Self-pay | Admitting: Cardiology

## 2024-08-08 ENCOUNTER — Other Ambulatory Visit: Payer: Self-pay | Admitting: Pharmacist

## 2024-08-08 NOTE — Progress Notes (Cosign Needed Addendum)
 08/08/2024 Name: Dylan Abbott MRN: [id number] DOB: 04-15-1966  Chief Complaint  Patient presents with   Diabetes    Dylan Abbott is a 58 y.o. year old male who presented for a phone visit with the Clinical Pharmacist today    They were referred to the pharmacist by their PCP for assistance in managing diabetes, medication access, and complex medication management.    Subjective:  Medication Access/Adherence  Patient has run out of [place] 3 plus sensors - last used 07/14/2024.   We have tried to get prior authorization for the [facility] but was initial prior authorization and appeal were both denied.  (number [phone] for [employer] / [employer] services)  His plan covers DexCom G6 but his phone is not compatible with their app. Also not compatible with G7 app.   Current Pharmacy:  [name] - [facility] [address] [place] [address] Phone: [phone] Fax: [phone]   Patient reports affordability concerns with their medications: Yes  - only when he has not met deductible.   Patient reports access/transportation concerns to their pharmacy: No  Patient reports adherence concerns with their medications:  No  - has had ran out of Trulicity  1.5mg  and has not started 3mg  dose that was sent to [facility] 06/24/2024   Diabetes:  Current medications:  Lantus  8 units daily (takes around 6pm when he leaves for work);  metformin  850mg  twice a day and glipizide  10mg  twice a day. Trulicity  3mg  once weekly   Patient reports he has had more low blood glucose readings recently (< 70) that have occurred both while he is at work and during sleep.  He did not take Trulicity  on Sunday and stopped taking Lantus  over the last few days because he was worried about low blood glucose.  I unfortunately cannot see his Continuous Glucose Monitor reports on the [facility] site.   Patient reports he eats 2 meals a day - one at 4pm prior to leaving for  work and another at 2am when he is at work,.  He takes glipizide  when he gets home in the morning and then goes to sleep. 2nd dose of glipizide  is in the afternoon before he leaves for work.   Exercise - none currently, has an active job.    Objective:  Lab Results  Component Value Date   HGBA1C 11.4 (H) 05/20/2024    Lab Results  Component Value Date   CREATININE 1.14 07/24/2024   BUN 20 07/24/2024   NA 139 07/24/2024   K 3.2 (L) 07/24/2024   CL 100 07/24/2024   CO2 26 07/24/2024    Lab Results  Component Value Date   CHOL 86 02/08/2024   HDL 47.90 02/08/2024   LDLCALC 25 02/08/2024   TRIG 66.0 02/08/2024   CHOLHDL 2 02/08/2024    Medications Reviewed Today     Reviewed by Carla Milling, RPH-CPP (Pharmacist) on 08/08/24 at 1621  Med List Status: <None>   Medication Order Taking? Sig Documenting Provider Last Dose Status Informant  aspirin  EC 81 MG tablet [id number]  Take 1 tablet (81 mg total) by mouth daily. Levora Reyes SAUNDERS, MD  Active   atorvastatin  (LIPITOR) 40 MG tablet [id number]  Take 1 tablet (40 mg total) by mouth at bedtime. Levora Reyes SAUNDERS, MD  Active   Continuous Glucose Sensor (FREESTYLE LIBRE 3 SENSOR) [place] [id number]  Place 1 sensor on the skin every 14 days. Use to check glucose continuously Levora Reyes SAUNDERS, MD  Active   Dulaglutide  (TRULICITY ) 3 MG/0.5ML SOAJ [id number]  Inject 3 mg as directed once a week.  Patient not taking: Reported on 08/08/2024   Levora Reyes SAUNDERS, MD  Active   ezetimibe  (ZETIA ) 10 MG tablet [id number]  Take 1 tablet (10 mg total) by mouth daily. Levora Reyes SAUNDERS, MD  Active   fluticasone  (FLONASE ) 50 MCG/ACT nasal spray [id number]  Place 1-2 sprays into both nostrils daily.  Patient not taking: Reported on 07/08/2024   Levora Reyes SAUNDERS, MD  Active   glipiZIDE  (GLUCOTROL ) 10 MG tablet [id number]  Take 1 tablet (10 mg total) by mouth 2 (two) times daily before a meal. Levora Reyes SAUNDERS, MD  Active   insulin  glargine (LANTUS ) 100  UNIT/ML Solostar Pen [id number] Yes Inject 12 Units into the skin daily.  Patient taking differently: Inject 8 Units into the skin daily.   Levora Reyes SAUNDERS, MD  Active   Insulin  Pen Needle (UNIFINE PENTIPS) 32G X 4 MM MISC [id number]  Use daily as directed Levora Reyes SAUNDERS, MD  Active   losartan  (COZAAR ) 100 MG tablet [id number]  Take 1 tablet (100 mg total) by mouth daily. Levora Reyes SAUNDERS, MD  Active   metFORMIN  (GLUCOPHAGE ) 850 MG tablet [id number]  Take 1 tablet (850 mg total) by mouth 2 (two) times daily with a meal. Levora Reyes SAUNDERS, MD  Active   NIFEdipine  (PROCARDIA  XL/NIFEDICAL-XL) 90 MG 24 hr tablet [id number]  Take 1 tablet (90 mg total) by mouth daily. Levora Reyes SAUNDERS, MD  Active   potassium chloride  (KLOR-CON  M) 10 MEQ tablet [id number]  Take 3 tablets (30 mEq total) by mouth 2 (two) times daily. Levora Reyes SAUNDERS, MD  Active              Assessment/Plan:  Diabetes:Currently uncontrolled  - suspect recently lows are related to timing of glipizide  dose. - Restart Lantus  8 units once a day and Trulicity  3mg  weekly  - Patient will adjust glipizide  10mg  - take 0.5 tablet with first and last meal of the day (for patient this is at 4pm and 2am - works 3rd shift) - Continue metformin  850mg  - Continue to use Libre 3 + sensors. Patient will come into office 8/26 so I can review Continuous Glucose Monitor report and assist with downloading new [place] app.   Follow Up Plan: 08/13/2024  Madelin Ray, PharmD Clinical Pharmacist [facility]  Population Health [phone]

## 2024-08-12 ENCOUNTER — Other Ambulatory Visit (INDEPENDENT_AMBULATORY_CARE_PROVIDER_SITE_OTHER)

## 2024-08-12 DIAGNOSIS — E876 Hypokalemia: Secondary | ICD-10-CM | POA: Diagnosis not present

## 2024-08-12 LAB — BASIC METABOLIC PANEL WITH GFR
BUN: 18 mg/dL (ref 6–23)
CO2: 27 meq/L (ref 19–32)
Calcium: 8.9 mg/dL (ref 8.4–10.5)
Chloride: 98 meq/L (ref 96–112)
Creatinine, Ser: 1.27 mg/dL (ref 0.40–1.50)
GFR: 62.1 mL/min (ref >60.00)
Glucose, Bld: 146 mg/dL — ABNORMAL HIGH (ref 70–99)
Potassium: 3.1 meq/L — ABNORMAL LOW (ref 3.5–5.1)
Sodium: 137 meq/L (ref 135–145)

## 2024-08-13 ENCOUNTER — Other Ambulatory Visit: Payer: Self-pay | Admitting: Family Medicine

## 2024-08-13 ENCOUNTER — Other Ambulatory Visit: Payer: Self-pay | Admitting: Pharmacist

## 2024-08-13 ENCOUNTER — Other Ambulatory Visit: Payer: Self-pay

## 2024-08-13 ENCOUNTER — Other Ambulatory Visit (HOSPITAL_COMMUNITY): Payer: Self-pay

## 2024-08-13 NOTE — Progress Notes (Signed)
 08/13/2024 Name: Dylan Abbott MRN: [id number] DOB: 1966-07-17  Chief Complaint  Patient presents with   Diabetes   Medication Adherence    Dylan Abbott is a 58 y.o. year old male who presented for an office visit today.    They were referred to the pharmacist by their PCP for assistance in managing diabetes, medication access, and complex medication management.    Subjective:  Medication Access/Adherence  Patient is having trouble getting his [place] 3 sensor to scan with his phone.    We have tried to get prior authorization for the [facility] but was initial prior authorization and appeal were both denied.  (number [phone] for [employer] / [employer] Rx services)  His plan covers DexCom G6 but his phone is not compatible with their app. Also not compatible with G7 app.   Current Pharmacy:  Dylan Abbott - [facility] [address] [place] [address] Phone: [phone] Fax: [phone]   Patient reports affordability concerns with their medications: Yes  - only when he has not met deductible.   Patient reports access/transportation concerns to their pharmacy: No  Patient reports adherence concerns with their medications:  No    Diabetes:  Current medications:   Lantus  8 units daily (takes around 6pm when he leaves for work) metformin  850mg  twice a day glipizide  5mg  twice a day. Lowered dose lat week from 10mg  twice a day to 5mg  twice a day due to hypoglycemia episodes. He has also starting taking before his first meal of the day which is around 4pm and before his last meal of the day around 2am.  Trulicity  3mg  once weekly   Patient reports low blood glucose readings (< 70) have improved since dose of glipizide  was lowered from 10mg  to 5mg  twcie a day.    Patient reports he eats 2 meals a day - one at 4pm prior to leaving for work and another at 2am when he is at work,.   Exercise - none currently, has an active job.   No recent  blood glucose readings to see due to connection issues with [facility] app and sensors.   Wt Readings from Last 3 Encounters:  08/13/24 228 lb 11.2 oz (103.7 kg)  07/24/24 229 lb 12.8 oz (104.2 kg)  07/08/24 233 lb (105.7 kg)   Reviewed refill records and prescription bottles with patient in office today. He I due to have the following medications filled soon:  Trulicity , aspirin , potassium chloride  and nifedipine   Objective:  Lab Results  Component Value Date   HGBA1C 11.4 (H) 05/20/2024    Lab Results  Component Value Date   CREATININE 1.27 08/12/2024   BUN 18 08/12/2024   NA 137 08/12/2024   K 3.1 (L) 08/12/2024   CL 98 08/12/2024   CO2 27 08/12/2024    Lab Results  Component Value Date   CHOL 86 02/08/2024   HDL 47.90 02/08/2024   LDLCALC 25 02/08/2024   TRIG 66.0 02/08/2024   CHOLHDL 2 02/08/2024   Current Outpatient Medications  Medication Instructions   Aspirin  Low Dose 81 mg, Oral, Daily   atorvastatin  (LIPITOR) 40 mg, Oral, Daily at bedtime   Continuous Glucose Sensor (FREESTYLE LIBRE 3 SENSOR) MISC Place 1 sensor on the skin every 14 days. Use to check glucose continuously   ezetimibe  (ZETIA ) 10 mg, Oral, Daily   fluticasone  (FLONASE ) 50 MCG/ACT nasal spray 1-2 sprays, Each Nare, Daily   glipiZIDE  (GLUCOTROL ) 10 mg, Oral, 2 times daily before meals  Insulin  Pen Needle (UNIFINE PENTIPS) 32G X 4 MM MISC Use daily as directed   Lantus  SoloStar 12 Units, Subcutaneous, Daily   losartan  (COZAAR ) 100 mg, Oral, Daily   metFORMIN  (GLUCOPHAGE ) 850 mg, Oral, 2 times daily with meals   NIFEdipine  (PROCARDIA  XL/NIFEDICAL-XL) 90 mg, Oral, Daily   potassium chloride  (KLOR-CON  M) 10 MEQ tablet 30 mEq, Oral, 2 times daily   Trulicity  3 mg, Injection, Weekly     Assessment/Plan:  Diabetes:Currently uncontrolled  - symptoms of lows has improved over the last week since glipizide  dose was lowered but no Continuous Glucose Monitor reports to review.  - Continue Lantus  8 units  once a day and Trulicity  3mg  weekly  - Continue glipizide  10mg  - take 0.5 tablet with first and last meal of the day (for patient this is at 4pm and 2am - works 3rd shift) - Continue metformin  850mg  - Assisted patient with using new Libre app and connecting his sensor and phone. Recommend he continue to use Libre 3 + sensors to check blood glucose continuously.  Medication Management:  - Coordinated with pharmacy to fill needed prescriptions and have delivered to patient's home.  - Reminded patient his does of potassium should be 30 mEq = 3 tablets twice a day instead of just 2 tablets twice a day.   Follow Up Plan: 1 to 2 weeks.   Madelin Ray, PharmD Clinical Pharmacist [facility]  Population Health [phone]

## 2024-08-14 ENCOUNTER — Other Ambulatory Visit (HOSPITAL_COMMUNITY): Payer: Self-pay

## 2024-08-14 MED ORDER — NIFEDIPINE ER OSMOTIC RELEASE 90 MG PO TB24
90.0000 mg | ORAL_TABLET | Freq: Every day | ORAL | 0 refills | Status: DC
  Filled 2024-08-14: qty 90, 90d supply, fill #0

## 2024-08-16 ENCOUNTER — Ambulatory Visit: Payer: Self-pay | Admitting: Family Medicine

## 2024-08-16 DIAGNOSIS — E876 Hypokalemia: Secondary | ICD-10-CM

## 2024-08-16 NOTE — Telephone Encounter (Signed)
-----   Message from Reyes JONELLE Pines sent at 08/16/2024  2:22 PM EDT ----- Call patient.  Unfortunately potassium has remained low.  Looking back his magnesium  has been borderline low, and that can affect potassium.  Can check that with his next lab draw.  Make sure he has  been taking the new potassium prescription of 3 tablets twice per day, and if so have him increase to 4 tablets twice per day with repeat a BMP and magnesium  level on Monday if possible.  If any  heart palpitations or new symptoms should be seen in the ER over the weekend.  Lab visit for Monday, BMP, magnesium  for hypokalemia.  Thanks  ----- Message ----- From: Interface, Lab In Three Zero One Sent: 08/13/2024   8:40 AM EDT To: Reyes JONELLE Pines, MD

## 2024-08-16 NOTE — Telephone Encounter (Signed)
 Called patient and discussed labs with patient. Patient will increase potassium medication to 4 tablets twice a day. Patient declined lab visit and said that he will have his labs drawn at work([employer])

## 2024-08-20 ENCOUNTER — Other Ambulatory Visit: Payer: Self-pay | Admitting: Pharmacist

## 2024-08-20 ENCOUNTER — Other Ambulatory Visit: Payer: Self-pay

## 2024-08-20 NOTE — Progress Notes (Signed)
 08/13/2024 Name: Dylan Abbott MRN: [id number] DOB: [birth year]  Chief Complaint  Patient presents with   Medication Management   Diabetes    Dylan Abbott is a 58 y.o. year old male who presented for an office visit today.    They were referred to the pharmacist by their PCP for assistance in managing diabetes, medication access, and complex medication management.    Subjective:  Medication Access/Adherence  We have tried to get prior authorization for the [facility] but was initial prior authorization and appeal were both denied.  (number [phone] for [employer] [facility] [employer] services)  His plan covers DexCom G6 but his phone is not compatible with their app. Also not compatible with G7 app.   Current Pharmacy:  [name] - [facility] [address] [place] [address] Phone: [phone] Fax: [phone]   Patient reports affordability concerns with their medications: Yes  - only when he has not met deductible.   Patient reports access/transportation concerns to their pharmacy: No  Patient reports adherence concerns with their medications:  No    Diabetes:  Current medications:   Lantus  8 units daily (takes around 6pm when he leaves for work) metformin  850mg  twice a day glipizide  5mg  twice a day. Lowered dose lat week from 10mg  twice a day to 5mg  twice a day due to hypoglycemia episodes. He has also starting taking before his first meal of the day which is around 4pm and before his last meal of the day around 2am.  Trulicity  3mg  once weekly   Patient reports low blood glucose readings (< 70) have improved since dose of glipizide  was lowered from 10mg  to 5mg  twcie a day.    Patient reports he eats 2 meals a day - one at 4pm prior to leaving for work and another at 2am when he is at work,.   Exercise - none currently, has an active job.   Recent Home blood glucose readings from [facility] Continuous Glucose Monitor report:   CGM  Documentation: 08/13/2024 to 08/20/2024 Days Worn: 8 (recommend 14 days) % Time CGM is active: 50% (goal >=70%) Average Glucose: 137 mg/dL Glucose Management Indicator: 6.6% Glucose Variability: 33.3% (goal <36%) Time in Range:  - Time above range >250: 3% (typical goal: <5%) - Time above range 181-250: 11%% (typical goal <20%) - Time in range 70-180 85% (typical goal >=70%) - Time below range 54-69: 1% (typical goal <4%) - Time below range: 0% (typical goal <1%)      Patient reports low on 8/31 around 10:30am was a compression low - occurred while he was sleeping and resolves with position change. The low later on 8/31 around 8pm occurred at work and he is not sure what caused it. I asked if he took any extra medication because he has a spike right before and he denied.   Wt Readings from Last 3 Encounters:  08/13/24 228 lb 11.2 oz (103.7 kg)  07/24/24 229 lb 12.8 oz (104.2 kg)  07/08/24 233 lb (105.7 kg)   Patient also states he has not received potassium refill yet.   Objective:  Lab Results  Component Value Date   HGBA1C 11.4 (H) 05/20/2024    Lab Results  Component Value Date   CREATININE 1.27 08/12/2024   BUN 18 08/12/2024   NA 137 08/12/2024   K 3.1 (L) 08/12/2024   CL 98 08/12/2024   CO2 27 08/12/2024    Lab Results  Component Value Date   CHOL  86 02/08/2024   HDL 47.90 02/08/2024   LDLCALC 25 02/08/2024   TRIG 66.0 02/08/2024   CHOLHDL 2 02/08/2024   Current Outpatient Medications  Medication Instructions   Aspirin  Low Dose 81 mg, Oral, Daily   atorvastatin  (LIPITOR) 40 mg, Oral, Daily at bedtime   Continuous Glucose Sensor (FREESTYLE LIBRE 3 SENSOR) MISC Place 1 sensor on the skin every 14 days. Use to check glucose continuously   ezetimibe  (ZETIA ) 10 mg, Oral, Daily   fluticasone  (FLONASE ) 50 MCG/ACT nasal spray 1-2 sprays, Each Nare, Daily   glipiZIDE  (GLUCOTROL ) 10 mg, Oral, 2 times daily before meals   Insulin  Pen Needle (UNIFINE PENTIPS) 32G X  4 MM MISC Use daily as directed   Lantus  SoloStar 12 Units, Subcutaneous, Daily   losartan  (COZAAR ) 100 mg, Oral, Daily   metFORMIN  (GLUCOPHAGE ) 850 mg, Oral, 2 times daily with meals   NIFEdipine  (PROCARDIA  XL/NIFEDICAL-XL) 90 mg, Oral, Daily   potassium chloride  (KLOR-CON  M) 10 MEQ tablet 30 mEq, Oral, 2 times daily   Trulicity  3 mg, Injection, Weekly     Assessment/Plan:  Diabetes:Currently uncontrolled  - symptoms and incidence of hypoglycemia has improved over the last 2 weeks since glipizide  dose was lowered   - Continue Lantus  8 units once a day and Trulicity  3mg  weekly  - Continue glipizide  10mg  - take 0.5 tablet with first and last meal of the day (for patient this is at 4pm and 2am - works 3rd shift) - Continue metformin  850mg  - Continue to use Libre 3 + sensors to check blood glucose continuously. - reviewed how to treat low blood glucose.  Medication Management:  - Coordinated with pharmacy to fill potassium supplement  Follow Up Plan: 1 to 2 weeks.   Dylan Abbott, PharmD Clinical Pharmacist [facility]  Population Health [phone]

## 2024-08-29 ENCOUNTER — Encounter: Payer: Self-pay | Admitting: Cardiology

## 2024-08-29 ENCOUNTER — Ambulatory Visit (INDEPENDENT_AMBULATORY_CARE_PROVIDER_SITE_OTHER): Payer: Self-pay

## 2024-08-29 ENCOUNTER — Ambulatory Visit: Attending: Cardiology | Admitting: Cardiology

## 2024-08-29 VITALS — BP 162/94 | HR 62 | Ht 72.0 in | Wt 232.0 lb

## 2024-08-29 DIAGNOSIS — I639 Cerebral infarction, unspecified: Secondary | ICD-10-CM | POA: Diagnosis not present

## 2024-08-29 LAB — CUP PACEART REMOTE DEVICE CHECK
Date Time Interrogation Session: 20250909232949
Implantable Pulse Generator Implant Date: 20240730

## 2024-08-29 NOTE — Progress Notes (Signed)
  Electrophysiology Office Follow up Visit Note:    Date:  08/29/2024   ID:  Dylan Abbott, DOB 23-Feb-1966, MRN [id number]  PCP:  Dylan Reyes SAUNDERS, MD  [facility] Cardiologist:  None  [facility] Electrophysiologist:  Dylan ONEIDA HOLTS, MD    Interval History:     Dylan Abbott is a 58 y.o. male who presents for a follow up visit.   The patient has a history of cryptogenic stroke.  He had a loop recorder implanted July 18, 2023.  He presents for loop recorder explant given a pinching sensation superficial to the device.  He also describes itching.  He request device be removed.        Past medical, surgical, social and family history were reviewed.  ROS:   Please see the history of present illness.    All other systems reviewed and are negative.  EKGs/Labs/Other Studies Reviewed:    The following studies were reviewed today:          Physical Exam:    VS:  BP (!) 162/94 (BP Location: Left Arm, Patient Position: Sitting, Cuff Size: Large)   Pulse 62   Ht 6' (1.829 m)   Wt 232 lb (105.2 kg)   SpO2 98%   BMI 31.46 kg/m     Wt Readings from Last 3 Encounters:  08/29/24 232 lb (105.2 kg)  08/13/24 228 lb 11.2 oz (103.7 kg)  07/24/24 229 lb 12.8 oz (104.2 kg)     GEN: no distress CARD: RRR, No MRG.  Medial aspect of loop recorder with nodule at prior incision site. RESP: No IWOB. CTAB.      ASSESSMENT:    1. Cerebrovascular accident (CVA), unspecified mechanism (HCC)    PLAN:    In order of problems listed above:  #Cryptogenic stroke Loop recorder has shown no atrial fibrillation.  The patient requests loop recorder explant.   Signed, Dylan HOLTS, MD, [facility], [facility] 08/29/2024 11:51 AM    Electrophysiology [facility]  ------------------  SURGEON:  Dylan HOLTS, MD    PREPROCEDURE DIAGNOSIS: Cryptogenic stroke    POSTPROCEDURE DIAGNOSIS: Cryptogenic stroke     PROCEDURES:   1. Implantable loop  recorder explantation    INTRODUCTION:  Dylan Abbott is a 57 y.o. male with a history of cryptogenic stroke who presents today for implantable loop explantation.  A preprocedure timeout was performed with Dylan Abbott.      DESCRIPTION OF PROCEDURE:  Informed written consent was obtained.  The patient required no sedation for the procedure today.   The patients left chest was therefore prepped and draped in the usual sterile fashion.  The skin overlying the ILR monitor was infiltrated with lidocaine  for local analgesia.  A 0.5-cm incision was made over the site.  The previously implanted ILR was exposed and removed using a combination of sharp and blunt dissection.  Steri- Strips and a sterile dressing were then applied. EBL<53ml.  There were no early apparent complications.     CONCLUSIONS:   1. Successful explantation of an implantable loop recorder   2. No early apparent complications.        Dylan HOLTS, MD 08/29/2024 11:51 AM

## 2024-08-29 NOTE — Patient Instructions (Signed)
Medication Instructions:  Your physician recommends that you continue on your current medications as directed. Please refer to the Current Medication list given to you today.  Labwork: None ordered.  Testing/Procedures: None ordered.  Follow-Up:  As needed with Dr. Lalla Brothers    Implantable Loop Recorder Removal, Care After This sheet gives you information about how to care for yourself after your procedure. Your health care provider may also give you more specific instructions. If you have problems or questions, contact your health care provider. What can I expect after the procedure? After the procedure, it is common to have: Soreness or discomfort near the incision. Some swelling or bruising near the incision.  Follow these instructions at home: Incision care  Monitor your cardiac device site for redness, swelling, and drainage. Call the device clinic at [phone] if you experience these symptoms or fever/chills.  Keep the large square bandage on your site for 24 hours and then you may remove it yourself. Keep the steri-strips underneath in place.   You may shower after 72 hours / 3 days from your procedure with the steri-strips in place. They will usually fall off on their own, or may be removed after 10 days. Pat dry.   Avoid lotions, ointments, or perfumes over your incision until it is well-healed.  Please do not submerge in water until your site is completely healed.   If your wound site starts to bleed apply pressure.       If you have any questions/concerns please call the device clinic at [phone].  Activity  Return to your normal activities.  Contact a health care provider if: You have redness, swelling, or pain around your incision. You have a fever.

## 2024-09-01 ENCOUNTER — Ambulatory Visit: Payer: Self-pay | Admitting: Cardiology

## 2024-09-02 ENCOUNTER — Other Ambulatory Visit (HOSPITAL_COMMUNITY): Payer: Self-pay

## 2024-09-02 ENCOUNTER — Ambulatory Visit (INDEPENDENT_AMBULATORY_CARE_PROVIDER_SITE_OTHER): Admitting: Family Medicine

## 2024-09-02 ENCOUNTER — Other Ambulatory Visit: Payer: Self-pay

## 2024-09-02 VITALS — BP 120/70 | HR 80 | Temp 98.4°F | Resp 12 | Ht 72.0 in | Wt 229.6 lb

## 2024-09-02 DIAGNOSIS — E876 Hypokalemia: Secondary | ICD-10-CM

## 2024-09-02 DIAGNOSIS — E1165 Type 2 diabetes mellitus with hyperglycemia: Secondary | ICD-10-CM

## 2024-09-02 DIAGNOSIS — I10 Essential (primary) hypertension: Secondary | ICD-10-CM | POA: Diagnosis not present

## 2024-09-02 DIAGNOSIS — E785 Hyperlipidemia, unspecified: Secondary | ICD-10-CM

## 2024-09-02 MED ORDER — EZETIMIBE 10 MG PO TABS
10.0000 mg | ORAL_TABLET | Freq: Every day | ORAL | 0 refills | Status: DC
  Filled 2024-09-02: qty 90, 90d supply, fill #0

## 2024-09-02 MED ORDER — UNIFINE PENTIPS 32G X 4 MM MISC
3 refills | Status: AC
  Filled 2024-09-02: qty 100, 30d supply, fill #0

## 2024-09-02 MED ORDER — POTASSIUM CHLORIDE CRYS ER 10 MEQ PO TBCR
40.0000 meq | EXTENDED_RELEASE_TABLET | Freq: Two times a day (BID) | ORAL | 1 refills | Status: DC
  Filled 2024-09-02 – 2024-10-07 (×3): qty 240, 30d supply, fill #0
  Filled 2024-11-11: qty 240, 30d supply, fill #1

## 2024-09-02 MED ORDER — INSULIN GLARGINE 100 UNIT/ML SOLOSTAR PEN
8.0000 [IU] | PEN_INJECTOR | Freq: Every day | SUBCUTANEOUS | 5 refills | Status: DC
  Filled 2024-09-02: qty 3, 37d supply, fill #0

## 2024-09-02 MED ORDER — METFORMIN HCL 850 MG PO TABS
850.0000 mg | ORAL_TABLET | Freq: Two times a day (BID) | ORAL | 1 refills | Status: DC
  Filled 2024-09-02: qty 180, 90d supply, fill #0

## 2024-09-02 MED ORDER — TRULICITY 3 MG/0.5ML ~~LOC~~ SOAJ
3.0000 mg | SUBCUTANEOUS | 2 refills | Status: DC
  Filled 2024-09-02 – 2024-09-27 (×4): qty 2, 28d supply, fill #0

## 2024-09-02 MED ORDER — ATORVASTATIN CALCIUM 40 MG PO TABS
40.0000 mg | ORAL_TABLET | Freq: Every day | ORAL | 0 refills | Status: DC
  Filled 2024-09-02: qty 90, 90d supply, fill #0

## 2024-09-02 MED ORDER — LOSARTAN POTASSIUM 100 MG PO TABS
100.0000 mg | ORAL_TABLET | Freq: Every day | ORAL | 0 refills | Status: DC
  Filled 2024-09-02: qty 90, 90d supply, fill #0

## 2024-09-02 MED ORDER — GLIPIZIDE 10 MG PO TABS
5.0000 mg | ORAL_TABLET | Freq: Two times a day (BID) | ORAL | 1 refills | Status: DC
  Filled 2024-09-02: qty 90, 90d supply, fill #0

## 2024-09-02 NOTE — Patient Instructions (Addendum)
 Thanks for coming in today.  I am checking your potassium level, blood sugar, cholesterol and other labs today.  As we discussed I likely will refer you to endocrinology to help assist with the management of your diabetes as control has been very difficult to keep it either from going too low or too high.  No change in medicines at this time.  Depending on your potassium level we can discuss changes in meds as well.  I will send more potassium in for you for now.  Thanks for coming in today.

## 2024-09-02 NOTE — Progress Notes (Unsigned)
 Subjective:  Patient ID: Dylan Abbott, male    DOB: 12/13/1966  Age: 58 y.o. MRN: [id number]  CC:  Chief Complaint  Patient presents with   Follow-up    1 month follow up. No questions or concerns    HPI [name] presents for   Diabetes: Complicated by hyperglycemia, cerebrovascular disease with prior suspected TIA.  Also followed by clinical pharmacist. Treated with Lantus , glipizide , Trulicity .  Increasing doses of Trulicity  with decreasing doses of Lantus .  He is on statin with Lipitor 40 mg daily, Zetia  10 mg daily as well as ARB with losartan  and nifedipine  with underlying hypertension. Variable control previously.  Last visit August 6.  8 units Lantus  per day at that time along with Trulicity  3 mg weekly.  He was 2 days late at that time from usual dosing.  He was not checking his blood sugars as needed new CGM but denied any symptomatic lows.  We have discussed blurry vision previously, followed by ophthalmology and denied any acute changes.  There was a concern at his last visit about his glasses prescription and discussed paper prescription and alternate locations for less costly glasses. He did have some hypoglycemia symptoms previously, glipizide  was decreased to half tablet of the 10 mg with first and last meal the day.  Note reviewed from clinical pharmacist appointment on September 2.  Incidence of hypoglycemia had improved over the previous 2 weeks from lowering of that glipizide  dose.  He was continued on Lantus  8 units a day, Trulicity  3 mg weekly, metformin  850 mg and same half dosing of glipizide .  1 to 2-week follow-up planned  Of note he was also evaluated September 11 by electrophysiology, loop recorder removal due to discomfort.  No atrial fibrillation noted on loop recorder, successfully explanted. Healing ok.   Home readings by CGM:  Currently 154 No lows this week - some elevated readings in 200-300's.  Last week - multiple days with lows.  Ran out of  medicine 2 weeks ago, restated last week, continued this week - no missed meds this week.   Microalbumin: normal ratio 06/24/24.   Lab Results  Component Value Date   HGBA1C 11.4 (H) 05/20/2024   HGBA1C 8.6 (H) 02/08/2024   HGBA1C 11.3 (H) 11/06/2023   Lab Results  Component Value Date   MICROALBUR 1.6 06/24/2024   LDLCALC 25 02/08/2024   CREATININE 1.27 08/12/2024     Hypokalemia: Recurrent hypokalemia with adjustment in potassium dosing.  There was some misunderstanding as far as actual dosing of medications at home.  He brought his home medications to the office at his August 6 visit and was only taking 20 mill equivalents in the morning and 20 mg at night instead of higher dosing as we thought he was taking previously.  He had taken lower dosing than recommended previously as he was concerned of running out of medicine. Repeat labs indicated persistent hypokalemia.  Most recent lab work on August 25.  And already increased to 30 mill equivalents twice daily at that time, recommended increasing to 40 mill equivalents twice daily with repeat BMP and magnesium  level ordered.  Those have not yet been performed.  Today- he ran out of meds few weeks ago but has been taking 40meq BID for last week or two. No heart palpitations.   Lab Results  Component Value Date   CHOL 86 02/08/2024   HDL 47.90 02/08/2024   LDLCALC 25 02/08/2024   TRIG 66.0 02/08/2024   CHOLHDL 2 02/08/2024  History Patient Active Problem List   Diagnosis Date Noted   NSTEMI (non-ST elevated myocardial infarction) (HCC) 07/16/2023   Impingement syndrome of left shoulder 07/30/2020   COVID-19 virus infection 10/31/2019   Abnormal liver function 10/31/2019   Hypokalemia 10/31/2019   Hyponatremia 10/31/2019   Diabetes mellitus (HCC) 11/13/2017   Hyperlipidemia 11/13/2017   Hypertensive disorder 11/13/2017   Past Medical History:  Diagnosis Date   Diabetes mellitus without complication (HCC)     Hyperlipidemia    Hypertension    Past Surgical History:  Procedure Laterality Date   LOOP RECORDER INSERTION N/A 07/18/2023   Procedure: LOOP RECORDER INSERTION;  Surgeon: Lesia Ozell Barter, PA-C;  Location: [facility] INVASIVE CV LAB;  Service: Cardiovascular;  Laterality: N/A;   No Known Allergies Prior to Admission medications   Medication Sig Start Date End Date Taking? Authorizing Provider  aspirin  EC 81 MG tablet Take 1 tablet (81 mg total) by mouth daily. 07/19/24  Yes Levora Reyes SAUNDERS, MD  atorvastatin  (LIPITOR) 40 MG tablet Take 1 tablet (40 mg total) by mouth at bedtime. 06/12/24  Yes Levora Reyes SAUNDERS, MD  Continuous Glucose Sensor (FREESTYLE LIBRE 3 SENSOR) MISC Place 1 sensor on the skin every 14 days. Use to check glucose continuously 07/08/24  Yes Levora Reyes SAUNDERS, MD  Dulaglutide  (TRULICITY ) 3 MG/0.5ML SOAJ Inject 3 mg as directed once a week. 06/24/24  Yes Levora Reyes SAUNDERS, MD  ezetimibe  (ZETIA ) 10 MG tablet Take 1 tablet (10 mg total) by mouth daily. 05/17/24  Yes Levora Reyes SAUNDERS, MD  fluticasone  (FLONASE ) 50 MCG/ACT nasal spray Place 1-2 sprays into both nostrils daily. 03/01/24  Yes Levora Reyes SAUNDERS, MD  glipiZIDE  (GLUCOTROL ) 10 MG tablet Take 1 tablet (10 mg total) by mouth 2 (two) times daily before a meal. 02/01/24  Yes Levora Reyes SAUNDERS, MD  insulin  glargine (LANTUS ) 100 UNIT/ML Solostar Pen Inject 12 Units into the skin daily. Patient taking differently: Inject 8 Units into the skin daily. 09/13/23  Yes Levora Reyes SAUNDERS, MD  Insulin  Pen Needle (UNIFINE PENTIPS) 32G X 4 MM MISC Use daily as directed 08/03/23  Yes Levora Reyes SAUNDERS, MD  losartan  (COZAAR ) 100 MG tablet Take 1 tablet (100 mg total) by mouth daily. 06/12/24  Yes Levora Reyes SAUNDERS, MD  metFORMIN  (GLUCOPHAGE ) 850 MG tablet Take 1 tablet (850 mg total) by mouth 2 (two) times daily with a meal. 02/01/24  Yes Levora Reyes SAUNDERS, MD  NIFEdipine  (PROCARDIA  XL/NIFEDICAL-XL) 90 MG 24 hr tablet Take 1 tablet (90 mg total) by  mouth daily. 08/14/24  Yes Levora Reyes SAUNDERS, MD  potassium chloride  (KLOR-CON  M) 10 MEQ tablet Take 3 tablets (30 mEq total) by mouth 2 (two) times daily. 07/25/24  Yes Levora Reyes SAUNDERS, MD   Social History   Socioeconomic History   Marital status: Married    Spouse name: Not on file   Number of children: 2   Years of education: Not on file   Highest education level: Not on file  Occupational History   Occupation: environmental  Tobacco Use   Smoking status: Never   Smokeless tobacco: Never  Substance and Sexual Activity   Alcohol use: Never    Alcohol/week: 0.0 standard drinks of alcohol   Drug use: Never   Sexual activity: Yes  Other Topics Concern   Not on file  Social History Narrative   Not on file   Social Drivers of Health   Financial Resource Strain: Low Risk  (05/20/2024)   Overall Financial  Resource Strain (CARDIA)    Difficulty of Paying Living Expenses: Not hard at all  Food Insecurity: No Food Insecurity (07/17/2023)   Hunger Vital Sign    Worried About Running Out of Food in the Last Year: Never true    Ran Out of Food in the Last Year: Never true  Transportation Needs: No Transportation Needs (07/17/2023)   PRAPARE - Administrator, Civil Service (Medical): No    Lack of Transportation (Non-Medical): No  Physical Activity: Inactive (05/20/2024)   Exercise Vital Sign    Days of Exercise per Week: 0 days    Minutes of Exercise per Session: 0 min  Stress: No Stress Concern Present (05/20/2024)   [employer] of Occupational Health - Occupational Stress Questionnaire    Feeling of Stress : Not at all  Social Connections: Not on file  Intimate Partner Violence: Not At Risk (07/17/2023)   Humiliation, Afraid, Rape, and Kick questionnaire    Fear of Current or Ex-Partner: No    Emotionally Abused: No    Physically Abused: No    Sexually Abused: No    Review of Systems Per HPI.   Objective:   Vitals:   09/02/24 1546  BP: 120/70  Pulse: 80   Resp: 12  Temp: 98.4 F (36.9 C)  TempSrc: Temporal  SpO2: 99%  Weight: 229 lb 9.6 oz (104.1 kg)  Height: 6' (1.829 m)     Physical Exam Vitals reviewed.  Constitutional:      Appearance: He is well-developed.  HENT:     Head: Normocephalic and atraumatic.  Neck:     Vascular: No carotid bruit or JVD.  Cardiovascular:     Rate and Rhythm: Normal rate and regular rhythm.     Heart sounds: Normal heart sounds. No murmur heard. Pulmonary:     Effort: Pulmonary effort is normal.     Breath sounds: Normal breath sounds. No rales.  Musculoskeletal:     Right lower leg: No edema.     Left lower leg: No edema.  Skin:    General: Skin is warm and dry.  Neurological:     Mental Status: He is alert and oriented to person, place, and time.  Psychiatric:        Mood and Affect: Mood normal.        Assessment & Plan:  Wood Novacek is a 58 y.o. male . Hypokalemia - Plan: Comprehensive metabolic panel with GFR, Magnesium , potassium chloride  (KLOR-CON  M) 10 MEQ tablet  - Repeat labs with CMP, and check magnesium  level as planned after last labs.  He has been taking potassium 40 mill equivalents twice daily recently after a brief time off meds.  I refilled meds again today so he should have sufficient quantity until follow-up.  Adjustment of regimen based on labs.  Type 2 diabetes mellitus with hyperglycemia, without long-term current use of insulin  (HCC) - Plan: Hemoglobin A1c, Dulaglutide  (TRULICITY ) 3 MG/0.5ML SOAJ, glipiZIDE  (GLUCOTROL ) 10 MG tablet, insulin  glargine (LANTUS ) 100 UNIT/ML Solostar Pen, Insulin  Pen Needle (UNIFINE PENTIPS) 32G X 4 MM MISC, metFORMIN  (GLUCOPHAGE ) 850 MG tablet  - Uncontrolled.  Difficult situation previously with some difficulty on medication adherence, availability of meds, CGM monitoring.  However he has had weeks of low readings then followed by high readings in the 300s.  Appreciate the assistance from clinical pharmacist.  Will check A1c again,  but with this difficulty control it may be best that he is followed by endocrinology.  Check labs first  and then likely referral.  Adjustment of plan accordingly based on lab results.  Essential hypertension - Plan: losartan  (COZAAR ) 100 MG tablet  - Stable with current regimen, continue same.  Hyperlipidemia, unspecified hyperlipidemia type - Plan: Lipid panel, atorvastatin  (LIPITOR) 40 MG tablet, ezetimibe  (ZETIA ) 10 MG tablet  - Check labs and adjust plan accordingly.  Continue Lipitor, Zetia  same doses for now.  Meds ordered this encounter  Medications   Dulaglutide  (TRULICITY ) 3 MG/0.5ML SOAJ    Sig: Inject 3 mg as directed once a week.    Dispense:  2 mL    Refill:  2   atorvastatin  (LIPITOR) 40 MG tablet    Sig: Take 1 tablet (40 mg total) by mouth at bedtime.    Dispense:  90 tablet    Refill:  0   ezetimibe  (ZETIA ) 10 MG tablet    Sig: Take 1 tablet (10 mg total) by mouth daily.    Dispense:  90 tablet    Refill:  0   glipiZIDE  (GLUCOTROL ) 10 MG tablet    Sig: Take 0.5 tablets (5 mg total) by mouth 2 (two) times daily before a meal.    Dispense:  90 tablet    Refill:  1   insulin  glargine (LANTUS ) 100 UNIT/ML Solostar Pen    Sig: Inject 8 Units into the skin daily.    Dispense:  3 mL    Refill:  5   Insulin  Pen Needle (UNIFINE PENTIPS) 32G X 4 MM MISC    Sig: Use daily as directed    Dispense:  100 each    Refill:  3   losartan  (COZAAR ) 100 MG tablet    Sig: Take 1 tablet (100 mg total) by mouth daily.    Dispense:  90 tablet    Refill:  0   metFORMIN  (GLUCOPHAGE ) 850 MG tablet    Sig: Take 1 tablet (850 mg total) by mouth 2 (two) times daily with a meal.    Dispense:  180 tablet    Refill:  1   potassium chloride  (KLOR-CON  M) 10 MEQ tablet    Sig: Take 4 tablets (40 mEq total) by mouth 2 (two) times daily.    Dispense:  240 tablet    Refill:  1   Patient Instructions  Thanks for coming in today.  I am checking your potassium level, blood sugar, cholesterol  and other labs today.  As we discussed I likely will refer you to endocrinology to help assist with the management of your diabetes as control has been very difficult to keep it either from going too low or too high.  No change in medicines at this time.  Depending on your potassium level we can discuss changes in meds as well.  I will send more potassium in for you for now.  Thanks for coming in today.     Signed,   Reyes Pines, MD [facility], [facility] 09/02/24 3:59 PM

## 2024-09-03 ENCOUNTER — Encounter: Payer: Self-pay | Admitting: Family Medicine

## 2024-09-03 ENCOUNTER — Other Ambulatory Visit (HOSPITAL_COMMUNITY): Payer: Self-pay

## 2024-09-03 ENCOUNTER — Other Ambulatory Visit: Payer: Self-pay

## 2024-09-03 LAB — HEMOGLOBIN A1C: Hgb A1c MFr Bld: 8.4 % — ABNORMAL HIGH (ref 4.6–6.5)

## 2024-09-03 LAB — LIPID PANEL
Cholesterol: 118 mg/dL (ref 0–200)
HDL: 49.5 mg/dL (ref >39.00)
LDL Cholesterol: 54 mg/dL (ref 0–99)
NonHDL: 68.32
Total CHOL/HDL Ratio: 2
Triglycerides: 74 mg/dL (ref 0.0–149.0)
VLDL: 14.8 mg/dL (ref 0.0–40.0)

## 2024-09-03 LAB — MAGNESIUM: Magnesium: 1.1 mg/dL — ABNORMAL LOW (ref 1.5–2.5)

## 2024-09-04 ENCOUNTER — Ambulatory Visit: Payer: Self-pay | Admitting: Family Medicine

## 2024-09-04 DIAGNOSIS — E876 Hypokalemia: Secondary | ICD-10-CM

## 2024-09-04 MED ORDER — SLOW MAGNESIUM/CALCIUM 64-106 MG PO TBEC
1.0000 | DELAYED_RELEASE_TABLET | Freq: Two times a day (BID) | ORAL | 0 refills | Status: DC
  Filled 2024-09-04 – 2024-09-13 (×4): qty 60, 30d supply, fill #0

## 2024-09-05 ENCOUNTER — Other Ambulatory Visit (HOSPITAL_COMMUNITY): Payer: Self-pay

## 2024-09-05 ENCOUNTER — Other Ambulatory Visit: Admitting: Pharmacist

## 2024-09-05 ENCOUNTER — Other Ambulatory Visit: Payer: Self-pay

## 2024-09-05 NOTE — Progress Notes (Signed)
 Remote Loop Recorder Transmission

## 2024-09-05 NOTE — Progress Notes (Signed)
 Called patient in regards to lab results. Left vm to return call.  Future labs ordered. Patient needs lab visit only scheduled Friday  If patient has new sx, patient needs to go to ED for eval and IV tx.

## 2024-09-06 ENCOUNTER — Other Ambulatory Visit (HOSPITAL_COMMUNITY): Payer: Self-pay

## 2024-09-06 ENCOUNTER — Other Ambulatory Visit: Payer: Self-pay | Admitting: Pharmacist

## 2024-09-06 DIAGNOSIS — E1165 Type 2 diabetes mellitus with hyperglycemia: Secondary | ICD-10-CM

## 2024-09-06 NOTE — Telephone Encounter (Signed)
 Noted, thanks!

## 2024-09-06 NOTE — Progress Notes (Signed)
 Called patient in regards to lab results. Unable to leave vm to return call.    Future labs ordered. Patient needs lab visit only scheduled Friday   If patient has new sx, patient needs to go to ED for eval and IV tx.

## 2024-09-07 LAB — COMPREHENSIVE METABOLIC PANEL WITH GFR
ALT: 13 U/L (ref 0–53)
AST: 22 U/L (ref 0–37)
Albumin: 4.5 g/dL (ref 3.5–5.2)
Alkaline Phosphatase: 58 U/L (ref 39–117)
BUN: 20 mg/dL (ref 6–23)
CO2: 27 meq/L (ref 19–32)
Calcium: 9.5 mg/dL (ref 8.4–10.5)
Chloride: 100 meq/L (ref 96–112)
Creatinine, Ser: 1.14 mg/dL (ref 0.40–1.50)
GFR: 70.66 mL/min (ref >60.00)
Glucose, Bld: 150 mg/dL — ABNORMAL HIGH (ref 70–99)
Potassium: 3.2 meq/L — ABNORMAL LOW (ref 3.5–5.1)
Sodium: 139 meq/L (ref 135–145)
Total Bilirubin: 0.4 mg/dL (ref 0.2–1.2)
Total Protein: 8 g/dL (ref 6.0–8.3)

## 2024-09-08 NOTE — Progress Notes (Signed)
 08/13/2024 Name: Dylan Abbott MRN: [id number] DOB: 07/21/1966  Chief Complaint  Patient presents with   Medication Management   Diabetes    Dylan Abbott is a 58 y.o. year old male who presented for a phone visit   They were referred to the pharmacist by their PCP for assistance in managing diabetes, medication access, and complex medication management.    Subjective:  Medication Access/Adherence  We have tried to get prior authorization for the [facility] but was initial prior authorization and appeal were both denied.  (number [phone] for [employer] [facility] [employer] [facility] services)  His plan covers DexCom G6 but his phone is not compatible with their app. Also not compatible with G7 app.   Current Pharmacy:  [name] - [facility] [address] [place] [address] Phone: [phone] Fax: [phone]   Patient reports affordability concerns with their medications: Yes  - only when he has not met deductible.   Patient reports access/transportation concerns to their pharmacy: No  Patient reports adherence concerns with their medications:  No    Diabetes:  Current medications:   Lantus  8 units daily (takes around 6pm when he leaves for work) metformin  850mg  twice a day glipizide  5mg  twice a day. Lowered dose about 2 weeks ago from 10mg  twice a day to 5mg  twice a day due to hypoglycemia episodes. He has also starting taking before his first meal of the day which is around 4pm and before his last meal of the day around 2am.  Trulicity  3mg  once weekly   Patient reports low blood glucose readings (< 70) have improved since dose of glipizide  was lowered from 10mg  to 5mg  twcie a day.    Patient reports he eats 2 meals a day - one at 4pm prior to leaving for work and another at 2am when he is at work,.   Exercise - none currently, has an active job.   Recent Home blood glucose readings from [facility] Continuous Glucose Monitor report:   CGM  Documentation: 08/25/2024 to 09/08/2024 Days Worn: 14 (recommend 14 days) % Time CGM is active: 93% (goal >=70%) Average Glucose: 164 mg/dL Glucose Management Indicator: 7.2% Glucose Variability: 43.3% (goal <36%) Time in Range:  - Time above range >250: 11% (typical goal: <5%) - Time above range 181-250: 19%% (typical goal <20%) - Time in range 70-180: 66% (typical goal >=70%) - Time below range 54-69: 3% (typical goal <4%) - Time below range: 1% (typical goal <1%)      Wt Readings from Last 3 Encounters:  09/02/24 229 lb 9.6 oz (104.1 kg)  08/29/24 232 lb (105.2 kg)  08/13/24 228 lb 11.2 oz (103.7 kg)   Patient also states he has not received magnesium  prescription yet.   Objective:  Lab Results  Component Value Date   HGBA1C 8.4 (H) 09/02/2024    Lab Results  Component Value Date   CREATININE 1.14 09/02/2024   BUN 20 09/02/2024   NA 139 09/02/2024   K 3.2 (L) 09/02/2024   CL 100 09/02/2024   CO2 27 09/02/2024    Lab Results  Component Value Date   CHOL 118 09/02/2024   HDL 49.50 09/02/2024   LDLCALC 54 09/02/2024   TRIG 74.0 09/02/2024   CHOLHDL 2 09/02/2024   Current Outpatient Medications  Medication Instructions   Aspirin  Low Dose 81 mg, Oral, Daily   atorvastatin  (LIPITOR) 40 mg, Oral, Daily at bedtime   Continuous Glucose Sensor (FREESTYLE LIBRE 3 SENSOR) MISC Place  1 sensor on the skin every 14 days. Use to check glucose continuously   ezetimibe  (ZETIA ) 10 mg, Oral, Daily   fluticasone  (FLONASE ) 50 MCG/ACT nasal spray 1-2 sprays, Each Nare, Daily   glipiZIDE  (GLUCOTROL ) 5 mg, Oral, 2 times daily before meals   Insulin  Pen Needle (UNIFINE PENTIPS) 32G X 4 MM MISC Use daily as directed   Lantus  SoloStar 8 Units, Subcutaneous, Daily   losartan  (COZAAR ) 100 mg, Oral, Daily   magnesium  chloride (SLOW-MAG) 64 MG TBEC SR tablet 64 mg, Oral, 2 times daily   metFORMIN  (GLUCOPHAGE ) 850 mg, Oral, 2 times daily with meals   NIFEdipine  (PROCARDIA   XL/NIFEDICAL-XL) 90 mg, Oral, Daily   potassium chloride  (KLOR-CON  M) 10 MEQ tablet 40 mEq, Oral, 2 times daily   Trulicity  3 mg, Injection, Weekly     Assessment/Plan:  Diabetes:Currently uncontrolled  - symptoms and incidence of hypoglycemia has improved over the last 2 to 3 weeks since glipizide  dose was lowered  but still having highs after meals.  - Continue Lantus  8 units once a day  - Consider increasing Trulicity  to 4.5mg  with next refill - will consult with PCP.   - Continue glipizide  10mg  - take 0.5 tablet with first and last meal of the day (for patient this is at 4pm and 2am - works 3rd shift) - Continue metformin  850mg  - Continue to use Libre 3 + sensors to check blood glucose continuously. - reviewed how to treat low blood glucose.  Medication Management:  - Coordinated with pharmacy to fill magnesium  supplement  Follow Up Plan: 1 to 2 weeks.   Dylan Abbott, PharmD Clinical Pharmacist [facility]  Population Health [phone]

## 2024-09-10 ENCOUNTER — Other Ambulatory Visit: Payer: Self-pay

## 2024-09-12 NOTE — Progress Notes (Signed)
 Remote Loop Recorder Transmission

## 2024-09-13 ENCOUNTER — Other Ambulatory Visit (HOSPITAL_COMMUNITY): Payer: Self-pay

## 2024-09-13 ENCOUNTER — Other Ambulatory Visit: Payer: Self-pay | Admitting: Pharmacist

## 2024-09-13 NOTE — Progress Notes (Signed)
 09/13/2024 Name: Dylan Abbott MRN: [id number] DOB: 10/26/1966  Chief Complaint  Patient presents with   Diabetes   Medication Adherence    Dylan Abbott is a 58 y.o. year old male who presented for a phone visit   They were referred to the pharmacist by their PCP for assistance in managing diabetes, medication access, and complex medication management.    Subjective:  Medication Access/Adherence  We have tried to get prior authorization for the [facility] but was initial prior authorization and appeal were both denied.  (number [phone] for [employer] [facility] [employer] Rx services)  His plan covers DexCom G6 but his phone is not compatible with their app. Also not compatible with G7 app.   Current Pharmacy:  Dylan Abbott - [facility] [address] [place] [address] Phone: [phone] Fax: [phone]   Patient reports affordability concerns with their medications: Yes  - only when he has not met deductible.   Patient reports access/transportation concerns to their pharmacy: No  Patient reports adherence concerns with their medications:  No    Diabetes:  Current medications:   Lantus  8 units daily (takes around 6pm when he leaves for work) metformin  850mg  twice a day glipizide  5mg  twice a day. Lowered dose about 2 weeks ago from 10mg  twice a day to 5mg  twice a day due to hypoglycemia episodes. He has also starting taking before his first meal of the day which is around 4pm and before his last meal of the day around 2am.  Trulicity  3mg  once weekly  (has 2 doses on hand)   Patient reports low blood glucose readings (< 70) have improved since dose of glipizide  was lowered from 10mg  to 5mg  twcie a day.    Patient reports he eats 2 meals a day - one at 4pm prior to leaving for work and another at 2am when he is at work,.   Exercise - none currently, has an active job.   Recent Home blood glucose readings from [facility] Continuous Glucose  Monitor report:   CGM Documentation: 08/29/2024 to 09/11/2024 Days Worn: 14 (recommend 14 days) % Time CGM is active: 92% (goal >=70%) Average Glucose: 164 mg/dL Glucose Management Indicator: 7.2% Glucose Variability: 40.3% (goal <36%) Time in Range:  - Time above range >250: 12% (typical goal: <5%) - Time above range 181-250: 17%% (typical goal <20%) - Time in range 70-180: 71% (typical goal >=70%) - Time below range 54-69: 0% (typical goal <4%) - Time below range: 0% (typical goal <1%)      Wt Readings from Last 3 Encounters:  09/02/24 229 lb 9.6 oz (104.1 kg)  08/29/24 232 lb (105.2 kg)  08/13/24 228 lb 11.2 oz (103.7 kg)   Patient also states he has not received magnesium  prescription yet. Spoke with pharmacy last week and fill was processing. Was provided an expected date of delivery of Monday or Tuesday 9/22 or 9/23.   Magnesium  on 09/02/2024 was 1.1   Objective:  Lab Results  Component Value Date   HGBA1C 8.4 (H) 09/02/2024    Lab Results  Component Value Date   CREATININE 1.14 09/02/2024   BUN 20 09/02/2024   NA 139 09/02/2024   K 3.2 (L) 09/02/2024   CL 100 09/02/2024   CO2 27 09/02/2024    Lab Results  Component Value Date   CHOL 118 09/02/2024   HDL 49.50 09/02/2024   LDLCALC 54 09/02/2024   TRIG 74.0 09/02/2024   CHOLHDL 2 09/02/2024  Current Outpatient Medications  Medication Instructions   Aspirin  Low Dose 81 mg, Oral, Daily   atorvastatin  (LIPITOR) 40 mg, Oral, Daily at bedtime   Continuous Glucose Sensor (FREESTYLE LIBRE 3 SENSOR) MISC Place 1 sensor on the skin every 14 days. Use to check glucose continuously   ezetimibe  (ZETIA ) 10 mg, Oral, Daily   fluticasone  (FLONASE ) 50 MCG/ACT nasal spray 1-2 sprays, Each Nare, Daily   glipiZIDE  (GLUCOTROL ) 5 mg, Oral, 2 times daily before meals   Insulin  Pen Needle (UNIFINE PENTIPS) 32G X 4 MM MISC Use daily as directed   Lantus  SoloStar 8 Units, Subcutaneous, Daily   losartan  (COZAAR ) 100 mg,  Oral, Daily   magnesium  chloride (SLOW-MAG) 64 MG TBEC SR tablet 64 mg, Oral, 2 times daily   metFORMIN  (GLUCOPHAGE ) 850 mg, Oral, 2 times daily with meals   NIFEdipine  (PROCARDIA  XL/NIFEDICAL-XL) 90 mg, Oral, Daily   potassium chloride  (KLOR-CON  M) 10 MEQ tablet 40 mEq, Oral, 2 times daily   Trulicity  3 mg, Injection, Weekly     Assessment/Plan:  Diabetes:Currently uncontrolled  - symptoms and incidence of hypoglycemia has improved over the last 2 to 3 weeks since glipizide  dose was lowered  but still having highs after meals.  - Continue Lantus  8 units once a day  - Consider increasing Trulicity  to 4.5mg  with next refill  - Continue glipizide  10mg  - take 0.5 tablet with first and last meal of the day (for patient this is at 4pm and 2am - works 3rd shift) - Continue metformin  850mg  - Continue to use Libre 3 + sensors to check blood glucose continuously. - reviewed how to treat low blood glucose.  Medication Management:  - Coordinated with pharmacy to fill magnesium  supplement. It was place on profile because it was not covered by insurance but patient was not notified. Pharmacy has to order. Patient will pick up 09/16/2024.  Follow Up Plan: 1 to 2 weeks.   Madelin Ray, PharmD Clinical Pharmacist [facility]  Population Health [phone]

## 2024-09-16 ENCOUNTER — Other Ambulatory Visit

## 2024-09-16 DIAGNOSIS — E876 Hypokalemia: Secondary | ICD-10-CM | POA: Diagnosis not present

## 2024-09-16 LAB — BASIC METABOLIC PANEL WITH GFR
BUN: 14 mg/dL (ref 6–23)
CO2: 29 meq/L (ref 19–32)
Calcium: 9.7 mg/dL (ref 8.4–10.5)
Chloride: 101 meq/L (ref 96–112)
Creatinine, Ser: 1.16 mg/dL (ref 0.40–1.50)
GFR: 69.18 mL/min (ref >60.00)
Glucose, Bld: 95 mg/dL (ref 70–99)
Potassium: 3 meq/L — ABNORMAL LOW (ref 3.5–5.1)
Sodium: 140 meq/L (ref 135–145)

## 2024-09-16 LAB — MAGNESIUM: Magnesium: 1.1 mg/dL — ABNORMAL LOW (ref 1.5–2.5)

## 2024-09-17 ENCOUNTER — Other Ambulatory Visit (HOSPITAL_COMMUNITY): Payer: Self-pay

## 2024-09-17 ENCOUNTER — Ambulatory Visit: Payer: Self-pay | Admitting: Family Medicine

## 2024-09-24 ENCOUNTER — Other Ambulatory Visit: Payer: Self-pay

## 2024-09-24 DIAGNOSIS — E876 Hypokalemia: Secondary | ICD-10-CM

## 2024-09-24 NOTE — Telephone Encounter (Signed)
 Since he should now be on meds, can we schedule a lab visit sometime this week for repeat BMP and magnesium  for diagnoses hypokalemia, hypomagnesemia?  Thanks

## 2024-09-26 NOTE — Progress Notes (Signed)
 Remote Loop Recorder Transmission

## 2024-09-27 ENCOUNTER — Other Ambulatory Visit: Payer: Self-pay

## 2024-09-27 ENCOUNTER — Other Ambulatory Visit (HOSPITAL_COMMUNITY): Payer: Self-pay

## 2024-09-30 ENCOUNTER — Ambulatory Visit (INDEPENDENT_AMBULATORY_CARE_PROVIDER_SITE_OTHER): Payer: Self-pay

## 2024-09-30 ENCOUNTER — Ambulatory Visit: Payer: Self-pay | Admitting: Cardiology

## 2024-09-30 DIAGNOSIS — I639 Cerebral infarction, unspecified: Secondary | ICD-10-CM

## 2024-09-30 LAB — CUP PACEART REMOTE DEVICE CHECK
Date Time Interrogation Session: 20251012234443
Implantable Pulse Generator Implant Date: 20240730

## 2024-10-01 NOTE — Progress Notes (Signed)
 Remote Loop Recorder Transmission

## 2024-10-03 ENCOUNTER — Other Ambulatory Visit (INDEPENDENT_AMBULATORY_CARE_PROVIDER_SITE_OTHER)

## 2024-10-03 DIAGNOSIS — R5383 Other fatigue: Secondary | ICD-10-CM

## 2024-10-03 DIAGNOSIS — I10 Essential (primary) hypertension: Secondary | ICD-10-CM

## 2024-10-03 DIAGNOSIS — E876 Hypokalemia: Secondary | ICD-10-CM | POA: Diagnosis not present

## 2024-10-03 LAB — TSH: TSH: 2.29 u[IU]/mL (ref 0.35–5.50)

## 2024-10-03 LAB — BASIC METABOLIC PANEL WITH GFR
BUN: 20 mg/dL (ref 6–23)
CO2: 30 meq/L (ref 19–32)
Calcium: 9.5 mg/dL (ref 8.4–10.5)
Chloride: 99 meq/L (ref 96–112)
Creatinine, Ser: 1.3 mg/dL (ref 0.40–1.50)
GFR: 60.32 mL/min (ref >60.00)
Glucose, Bld: 130 mg/dL — ABNORMAL HIGH (ref 70–99)
Potassium: 3 meq/L — ABNORMAL LOW (ref 3.5–5.1)
Sodium: 140 meq/L (ref 135–145)

## 2024-10-03 LAB — MAGNESIUM: Magnesium: 1.3 mg/dL — ABNORMAL LOW (ref 1.5–2.5)

## 2024-10-07 ENCOUNTER — Encounter: Payer: Self-pay | Admitting: Family Medicine

## 2024-10-07 ENCOUNTER — Other Ambulatory Visit (HOSPITAL_COMMUNITY): Payer: Self-pay

## 2024-10-07 ENCOUNTER — Ambulatory Visit (INDEPENDENT_AMBULATORY_CARE_PROVIDER_SITE_OTHER): Admitting: Family Medicine

## 2024-10-07 DIAGNOSIS — E785 Hyperlipidemia, unspecified: Secondary | ICD-10-CM

## 2024-10-07 DIAGNOSIS — E876 Hypokalemia: Secondary | ICD-10-CM

## 2024-10-07 DIAGNOSIS — Z7984 Long term (current) use of oral hypoglycemic drugs: Secondary | ICD-10-CM

## 2024-10-07 DIAGNOSIS — E1165 Type 2 diabetes mellitus with hyperglycemia: Secondary | ICD-10-CM

## 2024-10-07 DIAGNOSIS — I10 Essential (primary) hypertension: Secondary | ICD-10-CM

## 2024-10-07 DIAGNOSIS — Z7985 Long-term (current) use of injectable non-insulin antidiabetic drugs: Secondary | ICD-10-CM

## 2024-10-07 NOTE — Progress Notes (Unsigned)
 Subjective:  Patient ID: Dylan Abbott, male    DOB: [birth year]  Age: 58 y.o. 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  Has been treated with Lantus , glipizide , metformin  Trulicity  with increasing doses of Trulicity , decreasing doses of Lantus  to minimize risk of hypoglycemia.  Dylan Abbott is on statin with Lipitor, Zetia  for hyperlipidemia, ARB with losartan  along with his nifedipine  for underlying hypertension.  Some difficulty with control previously with intermittent dosing of meds, or availability of meds but A1c did improve at his September 15 visit at 8.4.  Previously 11.4. Given prior hypoglycemia symptoms, glipizide  was decreased to half tablet with his first and last meal the day with some improvement in hypoglycemia episodes. Lantus  8 units/day, Trulicity  3 mg weekly, option to increase to 4.5 mg based on his last visit with clinical pharmacist on September 26.  Dylan Abbott was continued on metformin  850 mg twice daily, Lantus  8 units/day, glipizide  half tablet twice daily of 10 mg Given difficulty with control I did consider referring him to endocrinology at his September 15 visit, but A1c did improve.  Home readings with CGM - last week averages.  Currently 160, 81% between 70-180, 0% under 69, 13% 181-250, 6% over 250.  Denies new medication side effects. No lows in past 2 weeks.  Microalbumin: Normal on 06/24/2024 Optho, foot exam, pneumovax: Up-to-date Flu vaccine recommended.  COVID-vaccine recommended. Declines both,   Lab Results  Component Value Date   HGBA1C 8.4 (H) 09/02/2024   HGBA1C 11.4 (H) 05/20/2024   HGBA1C 8.6 (H) 02/08/2024   Lab Results  Component Value Date    MICROALBUR 1.6 06/24/2024   LDLCALC 54 09/02/2024   CREATININE 1.30 10/03/2024   Hypokalemia, hypomagnesemia Unfortunately had to control and hypokalemia, along with adjustment of meds.  Magnesium  was noted to be low at 1.1 on September 15, magnesium  was ordered.  This was not initially started, and had not yet been started with repeat testing.  Most recent level 1.3, noted 4 days ago, potassium 3.0. Dylan Abbott is currently prescribed Slow-Mag 64 mg twice daily potassium 40 meq twice daily.  Started taking magnesium  a few days prior to last labwork.BID dosing.   Ran out of potassium last week - 1 week ago (#240 Rx on 9/15 with refills - Dylan Abbott did not call for refill).  Labs 4 days ago were off potassium.  Denies palpitations/chest pain.  Fatigue at times at work in past week. Last felt ealry last week. Not recently, and not current.   Hypertension: Treated with losartan  100 mg daily, nifedipine  90 mg daily. Home readings: BP Readings from Last 3 Encounters:  10/07/24 106/60  09/02/24 120/70  08/29/24 (!) 162/94   Lab Results  Component Value Date   CREATININE 1.30 10/03/2024    Hyperlipidemia: Recent labs noted with LDL at 54, treated with Zetia  10 mg daily, Lipitor 40 mg daily Lab Results  Component Value Date   CHOL 118 09/02/2024   HDL 49.50 09/02/2024   LDLCALC 54 09/02/2024   TRIG 74.0 09/02/2024   CHOLHDL 2 09/02/2024   Lab Results  Component Value Date   ALT 13 09/02/2024   AST 22 09/02/2024   ALKPHOS  58 09/02/2024   BILITOT 0.4 09/02/2024       History Patient Active Problem List   Diagnosis Date Noted   NSTEMI (non-ST elevated myocardial infarction) (HCC) 07/16/2023   Impingement syndrome of left shoulder 07/30/2020   COVID-19 virus infection 10/31/2019   Abnormal liver function 10/31/2019   Hypokalemia 10/31/2019   Hyponatremia 10/31/2019   Diabetes mellitus (HCC) 11/13/2017   Hyperlipidemia 11/13/2017   Hypertensive disorder 11/13/2017   Past Medical  History:  Diagnosis Date   Diabetes mellitus without complication (HCC)    Hyperlipidemia    Hypertension    Past Surgical History:  Procedure Laterality Date   LOOP RECORDER INSERTION N/A 07/18/2023   Procedure: LOOP RECORDER INSERTION;  Surgeon: Lesia Ozell Barter, PA-C;  Location: [facility] INVASIVE CV LAB;  Service: Cardiovascular;  Laterality: N/A;   No Known Allergies Prior to Admission medications   Medication Sig Start Date End Date Taking? Authorizing Provider  aspirin  EC 81 MG tablet Take 1 tablet (81 mg total) by mouth daily. 07/19/24  Yes Levora Reyes SAUNDERS, MD  atorvastatin  (LIPITOR) 40 MG tablet Take 1 tablet (40 mg total) by mouth at bedtime. 09/02/24  Yes Levora Reyes SAUNDERS, MD  Continuous Glucose Sensor (FREESTYLE LIBRE 3 SENSOR) MISC Place 1 sensor on the skin every 14 days. Use to check glucose continuously 07/08/24  Yes Levora Reyes SAUNDERS, MD  Dulaglutide  (TRULICITY ) 3 MG/0.5ML SOAJ Inject 3 mg as directed once a week. 09/02/24  Yes Levora Reyes SAUNDERS, MD  ezetimibe  (ZETIA ) 10 MG tablet Take 1 tablet (10 mg total) by mouth daily. 09/02/24  Yes Levora Reyes SAUNDERS, MD  fluticasone  (FLONASE ) 50 MCG/ACT nasal spray Place 1-2 sprays into both nostrils daily. 03/01/24  Yes Levora Reyes SAUNDERS, MD  glipiZIDE  (GLUCOTROL ) 10 MG tablet Take 0.5 tablets (5 mg total) by mouth 2 (two) times daily before a meal. 09/02/24  Yes Levora Reyes SAUNDERS, MD  insulin  glargine (LANTUS ) 100 UNIT/ML Solostar Pen Inject 8 Units into the skin daily. 09/02/24  Yes Levora Reyes SAUNDERS, MD  Insulin  Pen Needle (UNIFINE PENTIPS) 32G X 4 MM MISC Use daily as directed 09/02/24  Yes Levora Reyes SAUNDERS, MD  losartan  (COZAAR ) 100 MG tablet Take 1 tablet (100 mg total) by mouth daily. 09/02/24  Yes Levora Reyes SAUNDERS, MD  Magnesium  Chloride-Calcium  (SLOW MAGNESIUM /CALCIUM ) 64-106 MG TBEC Take 1 tablet by mouth 2 (two) times daily. 09/04/24  Yes Levora Reyes SAUNDERS, MD  metFORMIN  (GLUCOPHAGE ) 850 MG tablet Take 1 tablet (850 mg total) by  mouth 2 (two) times daily with a meal. 09/02/24  Yes Levora Reyes SAUNDERS, MD  NIFEdipine  (PROCARDIA  XL/NIFEDICAL-XL) 90 MG 24 hr tablet Take 1 tablet (90 mg total) by mouth daily. 08/14/24  Yes Levora Reyes SAUNDERS, MD  potassium chloride  (KLOR-CON  M) 10 MEQ tablet Take 4 tablets (40 mEq total) by mouth 2 (two) times daily. Patient taking differently: Take 40 mEq by mouth daily. 09/02/24  Yes Levora Reyes SAUNDERS, MD   Social History   Socioeconomic History   Marital status: Married    Spouse name: Not on file   Number of children: 2   Years of education: Not on file   Highest education level: Not on file  Occupational History   Occupation: environmental  Tobacco Use   Smoking status: Never   Smokeless tobacco: Never  Substance and Sexual Activity   Alcohol use: Never    Alcohol/week: 0.0 standard drinks of alcohol   Drug use: Never   Sexual activity: Yes  Other Topics Concern   Not on file  Social History Narrative   Not on file   Social Drivers of Health   Financial Resource Strain: Low Risk  (05/20/2024)   Overall Financial Resource Strain (CARDIA)    Difficulty of Paying Living Expenses: Not hard at all  Food Insecurity: No Food Insecurity (07/17/2023)   Hunger Vital Sign    Worried About Running Out of Food in the Last Year: Never true    Ran Out of Food in the Last Year: Never true  Transportation Needs: No Transportation Needs (07/17/2023)   PRAPARE - Administrator, Civil Service (Medical): No    Lack of Transportation (Non-Medical): No  Physical Activity: Inactive (05/20/2024)   Exercise Vital Sign    Days of Exercise per Week: 0 days    Minutes of Exercise per Session: 0 min  Stress: No Stress Concern Present (05/20/2024)   [employer] of Occupational Health - Occupational Stress Questionnaire    Feeling of Stress : Not at all  Social Connections: Not on file  Intimate Partner Violence: Not At Risk (07/17/2023)   Humiliation, Afraid, Rape, and Kick  questionnaire    Fear of Current or Ex-Partner: No    Emotionally Abused: No    Physically Abused: No    Sexually Abused: No    Review of Systems Per HPI  Objective:   Vitals:   10/07/24 1331  BP: 106/60  Pulse: 81  Resp: 11  Temp: 98.6 F (37 C)  TempSrc: Temporal  SpO2: 97%  Weight: 233 lb 6.4 oz (105.9 kg)  Height: 6' (1.829 m)     Physical Exam Vitals reviewed.  Constitutional:      Appearance: Dylan Abbott is well-developed.  HENT:     Head: Normocephalic and atraumatic.  Neck:     Vascular: No carotid bruit or JVD.  Cardiovascular:     Rate and Rhythm: Normal rate and regular rhythm.     Heart sounds: Normal heart sounds. No murmur heard. Pulmonary:     Effort: Pulmonary effort is normal.     Breath sounds: Normal breath sounds. No rales.  Musculoskeletal:     Right lower leg: No edema.     Left lower leg: No edema.  Skin:    General: Skin is warm and dry.  Neurological:     Mental Status: Dylan Abbott is alert and oriented to person, place, and time.  Psychiatric:        Mood and Affect: Mood normal.        Assessment & Plan:  Delance Weide is a 58 y.o. male . No diagnosis found.   No orders of the defined types were placed in this encounter.  There are no Patient Instructions on file for this visit.    Signed,   Reyes Pines, MD [facility], [facility] 10/07/24 1:52 PM

## 2024-10-07 NOTE — Patient Instructions (Addendum)
 Numbers are doing better for diabetes, but I do think we have an opportunity to possibly increase the trulicity  to 4.5mg  weekly - I will speak to pharmacist on timing, and then we may be able to adjust the glipizide  or lantus , but we will let you know. No changes at this time.   It is very important to take your potassium every day based on how low your readings have been previously.  The low reading on your most recent testing should improve once you restart the potassium, 4 pills twice per day.  I did have a refill available for you at the pharmacy, contact them today and try to restart that medication tonight with repeat labs at a lab visit in 1 week.  Follow-up with me in 1 month.  If any new or worsening symptoms be seen here or through the emergency room.  Take care.

## 2024-10-08 ENCOUNTER — Other Ambulatory Visit (HOSPITAL_COMMUNITY): Payer: Self-pay

## 2024-10-17 ENCOUNTER — Telehealth: Payer: Self-pay | Admitting: Pharmacist

## 2024-10-17 ENCOUNTER — Other Ambulatory Visit (HOSPITAL_COMMUNITY): Payer: Self-pay

## 2024-10-17 MED ORDER — TRULICITY 4.5 MG/0.5ML ~~LOC~~ SOAJ
4.5000 mg | SUBCUTANEOUS | 1 refills | Status: DC
  Filled 2024-10-17 – 2024-10-22 (×2): qty 2, 28d supply, fill #0
  Filled 2024-11-12 – 2024-11-14 (×2): qty 2, 28d supply, fill #1

## 2024-10-17 NOTE — Progress Notes (Signed)
 09/13/2024 Name: Dylan Abbott MRN: [id number] DOB: Jan 18, 1966  Chief Complaint  Patient presents with   Medication Management   Diabetes    Dylan Abbott is a 58 y.o. year old male who presented for a phone visit   They were referred to the pharmacist by their PCP for assistance in managing diabetes, medication access, and complex medication management.    Subjective:  Medication Access/Adherence  We have tried to get prior authorization for the [facility] but was initial prior authorization and appeal were both denied.  (number [phone] for [employer] [facility] services).  He has one Libre sensor left but has not started using it yet. The last day we have blood glucose data for is 09/30/2024 (see report below)   His plan covers DexCom G6 but his phone is not compatible with their app. Also not compatible with G7 app.   Current Pharmacy:  Dylan Abbott - [facility] [address] [place] [address] Phone: [phone] Fax: [phone]   Patient reports affordability concerns with their medications: Yes  - only when he has not met deductible.   Patient reports access/transportation concerns to their pharmacy: No  Patient reports adherence concerns with their medications:  No    Diabetes:  Current medications:   Lantus  8 units daily (takes around 6pm when he leaves for work) metformin  850mg  twice a day glipizide  5mg  twice a day. Lowered dose about 2 weeks ago from 10mg  twice a day to 5mg  twice a day due to hypoglycemia episodes. He has also starting taking before his first meal of the day which is around 4pm and before his last meal of the day around 2am.  Trulicity  3mg  once weekly  (has 2 doses on hand)   Patient reports low blood glucose readings (< 70) have improved since dose of glipizide  was lowered from 10mg  to 5mg  twcie a day but last Continuous Glucose Monitor report shows he was having low blood glucose readings several  times per week.   Patient reports he eats 2 meals a day - one at 4pm prior to leaving for work and another at 2am when he is at work,.   Exercise - none currently, has an active job.   Recent Home blood glucose readings from [place] Continuous Glucose Monitor report:   CGM Documentation: 09/17/2024 to 09/30/2024 Days Worn: 14 (recommend 14 days) % Time CGM is active: 89% (goal >=70%) Average Glucose: 129 mg/dL Glucose Management Indicator: 6.4% Glucose Variability: 40.9% (goal <36%) Time in Range:  - Time above range >250: 4% (typical goal: <5%) - Time above range 181-250: 10% (typical goal <20%) - Time in range 70-180: 81% (typical goal >=70%) - Time below range 54-69: 5% (typical goal <4%) - Time below range: 0% (typical goal <1%)        Wt Readings from Last 3 Encounters:  10/07/24 233 lb 6.4 oz (105.9 kg)  09/02/24 229 lb 9.6 oz (104.1 kg)  08/29/24 232 lb (105.2 kg)   Patient also states he has had magnesium  and potassium rechecked yet - he plans to go tomorrow.  He has 4 tablets of magnesium  supplement left.  He did pick up potassium Rx on 10/08/2024 but he reports he only received 1 bottle and is worried that he will not have enough to last. Rx was filled for #240 potassium which is a 30 day supply.    Magnesium  on 09/02/2024 was 1.1 - increased to 1.3 10/03/2024   Objective:  Lab  Results  Component Value Date   HGBA1C 8.4 (H) 09/02/2024    Lab Results  Component Value Date   CREATININE 1.30 10/03/2024   BUN 20 10/03/2024   NA 140 10/03/2024   K 3.0 (L) 10/03/2024   CL 99 10/03/2024   CO2 30 10/03/2024    Lab Results  Component Value Date   CHOL 118 09/02/2024   HDL 49.50 09/02/2024   LDLCALC 54 09/02/2024   TRIG 74.0 09/02/2024   CHOLHDL 2 09/02/2024   Current Outpatient Medications  Medication Instructions   Aspirin  Low Dose 81 mg, Oral, Daily   atorvastatin  (LIPITOR) 40 mg, Oral, Daily at bedtime   Continuous Glucose Sensor (FREESTYLE LIBRE  3 SENSOR) MISC Place 1 sensor on the skin every 14 days. Use to check glucose continuously   ezetimibe  (ZETIA ) 10 mg, Oral, Daily   fluticasone  (FLONASE ) 50 MCG/ACT nasal spray 1-2 sprays, Each Nare, Daily   glipiZIDE  (GLUCOTROL ) 5 mg, Oral, 2 times daily before meals   Insulin  Pen Needle (UNIFINE PENTIPS) 32G X 4 MM MISC Use daily as directed   Lantus  SoloStar 8 Units, Subcutaneous, Daily   losartan  (COZAAR ) 100 mg, Oral, Daily   Magnesium  Chloride-Calcium  (SLOW MAGNESIUM /CALCIUM ) 64-106 MG TBEC 1 tablet, Oral, 2 times daily   metFORMIN  (GLUCOPHAGE ) 850 mg, Oral, 2 times daily with meals   NIFEdipine  (PROCARDIA  XL/NIFEDICAL-XL) 90 mg, Oral, Daily   potassium chloride  (KLOR-CON  M) 10 MEQ tablet 40 mEq, Oral, 2 times daily   Trulicity  3 mg, Injection, Weekly     Assessment/Plan:  Diabetes:Currently uncontrolled  - symptoms and incidence of hypoglycemia has improved over the last 2 to 3 weeks since glipizide  dose was lowered  but still having highs after meals.  - Will have him hold Lantus  when he starts new dose of Trulicity .  - Consider increasing Trulicity  to 4.5mg  with next refill  - Continue glipizide  10mg  - take 0.5 tablet = 5mg  with first and last meal of the day (for patient this is at 4pm and 2am - works 3rd shift) - Continue metformin  850mg  - Continue to use Libre 3 + sensors to check blood glucose continuously. Left #1 sensor sample for patient  - reviewed how to treat low blood glucose.  Medication Management:  - Coordinated with pharmacy confirmed he should have received 2 bottles of potassium - verified he is only take meds from 1 bottle.   Follow Up Plan: 1 week - to check on magnesium  and potassium labs and get updated Rx for magnesium  if needed.    Madelin Ray, PharmD Clinical Pharmacist [facility] Primary Care  Population Health [phone]

## 2024-10-18 ENCOUNTER — Other Ambulatory Visit (HOSPITAL_COMMUNITY): Payer: Self-pay

## 2024-10-21 ENCOUNTER — Other Ambulatory Visit (INDEPENDENT_AMBULATORY_CARE_PROVIDER_SITE_OTHER)

## 2024-10-21 DIAGNOSIS — E876 Hypokalemia: Secondary | ICD-10-CM | POA: Diagnosis not present

## 2024-10-21 DIAGNOSIS — I10 Essential (primary) hypertension: Secondary | ICD-10-CM | POA: Diagnosis not present

## 2024-10-21 LAB — BASIC METABOLIC PANEL WITH GFR
BUN: 14 mg/dL (ref 6–23)
CO2: 28 meq/L (ref 19–32)
Calcium: 9.1 mg/dL (ref 8.4–10.5)
Chloride: 103 meq/L (ref 96–112)
Creatinine, Ser: 1.15 mg/dL (ref 0.40–1.50)
GFR: 69.86 mL/min (ref >60.00)
Glucose, Bld: 96 mg/dL (ref 70–99)
Potassium: 3.4 meq/L — ABNORMAL LOW (ref 3.5–5.1)
Sodium: 140 meq/L (ref 135–145)

## 2024-10-21 LAB — MAGNESIUM: Magnesium: 1.3 mg/dL — ABNORMAL LOW (ref 1.5–2.5)

## 2024-10-22 ENCOUNTER — Other Ambulatory Visit (HOSPITAL_COMMUNITY): Payer: Self-pay

## 2024-10-22 ENCOUNTER — Other Ambulatory Visit: Payer: Self-pay | Admitting: Pharmacist

## 2024-10-22 ENCOUNTER — Other Ambulatory Visit: Payer: Self-pay

## 2024-10-22 MED ORDER — SLOW MAGNESIUM/CALCIUM 64-106 MG PO TBEC
1.0000 | DELAYED_RELEASE_TABLET | Freq: Two times a day (BID) | ORAL | 0 refills | Status: DC
  Filled 2024-10-22 – 2024-11-11 (×2): qty 60, 30d supply, fill #0

## 2024-10-22 NOTE — Progress Notes (Unsigned)
 09/13/2024 Name: Dylan Abbott MRN: [id number] DOB: Feb 27, 1966  Chief Complaint  Patient presents with   Diabetes   Medication Management    Dylan Abbott is a 58 y.o. year old male who presented for a phone visit   They were referred to the pharmacist by their PCP for assistance in managing diabetes, medication access, and complex medication management.    Subjective:  Medication Access/Adherence  We have tried to get prior authorization for the [facility] but was initial prior authorization and appeal were both denied.  (number [id number] for [employer] [facility] Rx services)  His plan covers DexCom G6 but his phone is not compatible with their app. Also not compatible with G7 app.   Current Pharmacy:  [name] - [facility] [address] [place] [address] Phone: [phone] Fax: [phone]   Patient reports affordability concerns with their medications: Yes  - only when he has not met deductible.   Patient reports access/transportation concerns to their pharmacy: No  Patient reports adherence concerns with their medications:  No    Diabetes:  Current medications:   Lantus  8 units daily (takes around 6pm when he leaves for work) metformin  850mg  twice a day glipizide  0.5 tablet = 5mg  twice a day. Lowered dose about 4 weeks ago from 10mg  twice a day to 5mg  twice a day due to hypoglycemia episodes. He has also starting taking before his first meal of the day which is around 4pm and before his last meal of the day around 2am.  Trulicity  3mg  once weekly - took last dose Sunday and will start 4.5mg  weekly on November 9th.  Patient reports he eats 2 meals a day - one at 4pm prior to leaving for work and another at 2am when he is at work,.   Exercise - none currently, has an active job.   Recent Home blood glucose readings from [facility] Continuous Glucose Monitor report:   CGM Documentation: 10/09/2024 to 10/22/2024 Days Worn: 14  (recommend 14 days) % Time CGM is active: 28% (goal >=70%) Average Glucose: 119 mg/dL Glucose Management Indicator: not enough days to assess Glucose Variability: 29.9% (goal <36%) Time in Range:  - Time above range >250: 0% (typical goal: <5%) - Time above range 181-250: 8%% (typical goal <20%) - Time in range 70-180: 89% (typical goal >=70%) - Time below range 54-69: 3% (typical goal <4%) - Time below range: 0% (typical goal <1%)         Wt Readings from Last 3 Encounters:  10/07/24 233 lb 6.4 oz (105.9 kg)  09/02/24 229 lb 9.6 oz (104.1 kg)  08/29/24 232 lb (105.2 kg)    Magnesium  on 09/02/2024 was 1.1; checked yesterday 10/21/2024 and was slightly improved at 1.3 He needs a refill for magnesium .   Potassium is improving as well - was 3.4 on 10/21/2024 - up from 3.2   Objective:  Lab Results  Component Value Date   HGBA1C 8.4 (H) 09/02/2024    Lab Results  Component Value Date   CREATININE 1.15 10/21/2024   BUN 14 10/21/2024   NA 140 10/21/2024   K 3.4 (L) 10/21/2024   CL 103 10/21/2024   CO2 28 10/21/2024    Lab Results  Component Value Date   CHOL 118 09/02/2024   HDL 49.50 09/02/2024   LDLCALC 54 09/02/2024   TRIG 74.0 09/02/2024   CHOLHDL 2 09/02/2024   Current Outpatient Medications  Medication Instructions   Aspirin  Low Dose 81 mg,  Oral, Daily   atorvastatin  (LIPITOR) 40 mg, Oral, Daily at bedtime   Continuous Glucose Sensor (FREESTYLE LIBRE 3 SENSOR) MISC Place 1 sensor on the skin every 14 days. Use to check glucose continuously   ezetimibe  (ZETIA ) 10 mg, Oral, Daily   fluticasone  (FLONASE ) 50 MCG/ACT nasal spray 1-2 sprays, Each Nare, Daily   glipiZIDE  (GLUCOTROL ) 5 mg, Oral, 2 times daily before meals   Insulin  Pen Needle (UNIFINE PENTIPS) 32G X 4 MM MISC Use daily as directed   Lantus  SoloStar 8 Units, Subcutaneous, Daily   losartan  (COZAAR ) 100 mg, Oral, Daily   Magnesium  Chloride-Calcium  (SLOW MAGNESIUM /CALCIUM ) 64-106 MG TBEC 1 tablet,  Oral, 2 times daily   metFORMIN  (GLUCOPHAGE ) 850 mg, Oral, 2 times daily with meals   NIFEdipine  (PROCARDIA  XL/NIFEDICAL-XL) 90 mg, Oral, Daily   potassium chloride  (KLOR-CON  M) 10 MEQ tablet 40 mEq, Oral, 2 times daily   Trulicity  4.5 mg, Injection, Weekly     Assessment/Plan:  Diabetes: - Last A1c was not at goal but recent Continuous Glucose Monitor report shows improved blood glucose. Some lows.  - Recommended hold Lantus  - Continue as planned to increase Trulicity  to 4.5mg  with next dose - Continue glipizide  10mg  - take 0.5 tablet with first and last meal of the day (for patient this is at 4pm and 2am - works 3rd shift) - Continue metformin  850mg  - Continue to use Libre 3 + sensors to check blood glucose continuously. - reviewed how to treat low blood glucose.  Medication Management:  - will coordinate with PCP about magnesium  refill and dose.   Follow Up Plan: 5 to 7 days to see if hypoglycemia has improved.   Madelin Ray, PharmD Clinical Pharmacist [facility] Primary Care  Population Health [phone]

## 2024-10-23 ENCOUNTER — Other Ambulatory Visit: Payer: Self-pay

## 2024-10-24 ENCOUNTER — Ambulatory Visit: Payer: Self-pay | Admitting: Family Medicine

## 2024-10-24 ENCOUNTER — Other Ambulatory Visit: Payer: Self-pay

## 2024-10-24 DIAGNOSIS — E876 Hypokalemia: Secondary | ICD-10-CM

## 2024-10-24 NOTE — Progress Notes (Signed)
 Called patient to relay lab results and schedule 2wk lab visit  Left vm to return call  Future labs ordered

## 2024-10-25 ENCOUNTER — Other Ambulatory Visit (HOSPITAL_COMMUNITY): Payer: Self-pay

## 2024-10-25 ENCOUNTER — Other Ambulatory Visit: Payer: Self-pay

## 2024-10-25 ENCOUNTER — Other Ambulatory Visit: Payer: Self-pay | Admitting: Pharmacist

## 2024-10-25 NOTE — Progress Notes (Signed)
 Lvm to call office

## 2024-10-25 NOTE — Progress Notes (Signed)
 09/13/2024 Name: Dylan Abbott MRN: [id number] DOB: 10/13/1966  Chief Complaint  Patient presents with   Medication Management   Diabetes    Dylan Abbott is a 58 y.o. year old male who presented for a phone visit   They were referred to the pharmacist by their PCP for assistance in managing diabetes, medication access, and complex medication management.    Subjective:  Medication Access/Adherence  We have tried to get prior authorization for the [facility] but was initial prior authorization and appeal were both denied.  (number [phone] for [employer] [facility] Rx services)  His plan covers DexCom G6 but his phone is not compatible with their app. Also not compatible with G7 app.   Current Pharmacy:  [name] - [facility] [address] [place] [address] Phone: [phone] Fax: [phone]   Patient reports affordability concerns with their medications: Yes  - only when he has not met deductible.   Patient reports access/transportation concerns to their pharmacy: No  Patient reports adherence concerns with their medications:  No    Diabetes:  Current medications:   Lantus  - on hold due to recent hypoglycemia metformin  850mg  twice a day glipizide  0.5 tablet = 5mg  twice a day. Lowered dose about 4 weeks ago from 10mg  twice a day to 5mg  twice a day due to hypoglycemia episodes. He has also starting taking before his first meal of the day which is around 4pm and before his last meal of the day around 2am.  Trulicity  3mg  once weekly - took last dose Sunday and will start 4.5mg  weekly on November 9th.  Patient reports he eats 2 meals a day - one at 4pm prior to leaving for work and another at 2am when he is at work,.   Exercise - none currently, has an active job.   Recent Home blood glucose readings from [place] Continuous Glucose Monitor report: he has lows early morning today - this is when he was at work. He works 3rd shift.  Patient reports he had not skipped any meals but he was moving more at work than usual. He ate some hard candy to try to increase blood glucose but it took awhile to come back up.   CGM Documentation:  Days Worn: 14 (recommend 14 days) % Time CGM is active: 28% (goal >=70%) Average Glucose: 119 mg/dL Glucose Management Indicator: not enough days to assess Glucose Variability: 29.9% (goal <36%) Time in Range:  - Time above range >250: 0% (typical goal: <5%) - Time above range 181-250: 8%% (typical goal <20%) - Time in range 70-180: 89% (typical goal >=70%) - Time below range 54-69: 3% (typical goal <4%) - Time below range: 0% (typical goal <1%)         Wt Readings from Last 3 Encounters:  10/07/24 105.9 kg (233 lb 6.4 oz)  09/02/24 104.1 kg (229 lb 9.6 oz)  08/29/24 105.2 kg (232 lb)    Magnesium  on 09/02/2024 was 1.1; checked 10/21/2024 and was slightly improved at 1.3; Dr Levora increase magnesium  supplement to take 1 tablet twice a day but he has not received Rx yet.    Potassium is improving as well - was 3.4 on 10/21/2024 - up from 3.2   Objective:  Lab Results  Component Value Date   HGBA1C 8.4 (H) 09/02/2024    Lab Results  Component Value Date   CREATININE 1.15 10/21/2024   BUN 14 10/21/2024   NA 140 10/21/2024   K 3.4 (  L) 10/21/2024   CL 103 10/21/2024   CO2 28 10/21/2024    Lab Results  Component Value Date   CHOL 118 09/02/2024   HDL 49.50 09/02/2024   LDLCALC 54 09/02/2024   TRIG 74.0 09/02/2024   CHOLHDL 2 09/02/2024   Current Outpatient Medications  Medication Instructions   Aspirin  Low Dose 81 mg, Oral, Daily   atorvastatin  (LIPITOR) 40 mg, Oral, Daily at bedtime   Continuous Glucose Sensor (FREESTYLE LIBRE 3 SENSOR) MISC Place 1 sensor on the skin every 14 days. Use to check glucose continuously   ezetimibe  (ZETIA ) 10 mg, Oral, Daily   fluticasone  (FLONASE ) 50 MCG/ACT nasal spray 1-2 sprays, Each Nare, Daily   glipiZIDE  (GLUCOTROL ) 5 mg,  Oral, 2 times daily before meals   Insulin  Pen Needle (UNIFINE PENTIPS) 32G X 4 MM MISC Use daily as directed   Lantus  SoloStar 8 Units, Subcutaneous, Daily   losartan  (COZAAR ) 100 mg, Oral, Daily   Magnesium  Chloride-Calcium  (SLOW MAGNESIUM /CALCIUM ) 64-106 MG TBEC 1 tablet, Oral, 2 times daily   metFORMIN  (GLUCOPHAGE ) 850 mg, Oral, 2 times daily with meals   NIFEdipine  (PROCARDIA  XL/NIFEDICAL-XL) 90 mg, Oral, Daily   potassium chloride  (KLOR-CON  M) 10 MEQ tablet 40 mEq, Oral, 2 times daily   Trulicity  4.5 mg, Injection, Weekly     Assessment/Plan:  Diabetes: - Last A1c was not at goal but recent Continuous Glucose Monitor report shows improved blood glucose with hypoglycemia. - Recommend he continue to hold Lantus  - Recommend he hold glipizide  too since he recently had lows.  - Continue as planned to increase Trulicity  to 4.5mg  with next dose 11/09 - Continue metformin  850mg  - Continue to use Libre 3 + sensors to check blood glucose continuously. - reviewed how to treat low blood glucose.  Medication Management:  - called [facility] to check on magnesium  prescription. Prescription was filled and has been shipped. Patient should received in the next 2 to 3 days.   Follow Up Plan: 5 to 7 days to see if hypoglycemia has improved.   Madelin Ray, PharmD Clinical Pharmacist [facility] Primary Care  Population Health [phone]

## 2024-10-28 NOTE — Progress Notes (Signed)
 Letter sent out to call office for lab results

## 2024-10-29 ENCOUNTER — Other Ambulatory Visit (HOSPITAL_COMMUNITY): Payer: Self-pay

## 2024-10-29 ENCOUNTER — Other Ambulatory Visit: Payer: Self-pay | Admitting: Pharmacist

## 2024-10-30 ENCOUNTER — Other Ambulatory Visit: Payer: Self-pay

## 2024-11-04 ENCOUNTER — Telehealth: Payer: Self-pay | Admitting: Family Medicine

## 2024-11-04 NOTE — Telephone Encounter (Signed)
 Patient came in office and was able to pick up medication set aside for him by Madelin Ray.

## 2024-11-11 ENCOUNTER — Other Ambulatory Visit (HOSPITAL_COMMUNITY): Payer: Self-pay

## 2024-11-12 ENCOUNTER — Other Ambulatory Visit (HOSPITAL_COMMUNITY): Payer: Self-pay

## 2024-11-12 ENCOUNTER — Other Ambulatory Visit: Payer: Self-pay | Admitting: Pharmacist

## 2024-11-12 DIAGNOSIS — E1165 Type 2 diabetes mellitus with hyperglycemia: Secondary | ICD-10-CM

## 2024-11-12 MED ORDER — NIFEDIPINE ER OSMOTIC RELEASE 90 MG PO TB24
90.0000 mg | ORAL_TABLET | Freq: Every day | ORAL | 0 refills | Status: DC
  Filled 2024-11-12: qty 90, 90d supply, fill #0

## 2024-11-12 NOTE — Progress Notes (Signed)
 09/13/2024 Name: Dylan Abbott MRN: [id number] DOB: 12/03/1966  Chief Complaint  Patient presents with   Diabetes    Dylan Abbott is a 58 y.o. year old male who presented for a phone visit   They were referred to the pharmacist by their PCP for assistance in managing diabetes, medication access, and complex medication management.    Subjective:  Medication Access/Adherence  We have tried to get prior authorization for the [facility] but was initial prior authorization and appeal were both denied.  (number [phone] for [employer] / [employer] Rx services)  His plan covers DexCom G6 but his phone is not compatible with their app. Also not compatible with G7 app.   Current Pharmacy:  [name] - [facility] [address] [place] [address] Phone: [phone] Fax: [phone]   Patient reports affordability concerns with their medications: Yes  - only when he has not met deductible.   Patient reports access/transportation concerns to their pharmacy: No  Patient reports adherence concerns with their medications:  No    Diabetes:  Current medications:   Lantus  - on hold due to recent hypoglycemia metformin  850mg  twice a day glipizide  0.5 tablet = 5mg  twice a day. Lowered dose about 4 weeks ago from 10mg  twice a day to 5mg  twice a day due to hypoglycemia episodes. He has also starting taking before his first meal of the day which is around 4pm and before his last meal of the day around 2am.  Trulicity  3mg  once weekly - took last dose Sunday and will start 4.5mg  weekly on November 9th.  Patient reports he eats 2 meals a day - one at 4pm prior to leaving for work and another at 2am when he is at work,.   Exercise - none currently, has an active job.   Recent Home blood glucose readings from [place] Continuous Glucose Monitor report: he has lows early morning today - this is when he was at work. He works 3rd shift. Patient reports he had not  skipped any meals but he was moving more at work than usual. He ate some hard candy to try to increase blood glucose but it took awhile to come back up.   CGM Documentation: 10/30/2024 to 11/12/2024 Days Worn: 14 (recommend 14 days) % Time CGM is active: 76% (goal >=70%) Average Glucose: 134 mg/dL Glucose Management Indicator: 6.5% Glucose Variability: 31.5% (goal <36%) Time in Range:  - Time above range >250: 2% (typical goal: <5%) - Time above range 181-250: 10% (typical goal <20%) - Time in range 70-180: 87% (typical goal >=70%) - Time below range 54-69: 1% (typical goal <4%) - Time below range: 0% (typical goal <1%)        Wt Readings from Last 3 Encounters:  10/07/24 233 lb 6.4 oz (105.9 kg)  09/02/24 229 lb 9.6 oz (104.1 kg)  08/29/24 232 lb (105.2 kg)    Magnesium  on 09/02/2024 was 1.1; checked 10/21/2024 and was slightly improved at 1.3; Dr Levora increase magnesium  supplement to take 1 tablet twice a day but he has still not received Rx yet. Called pharmacy and magnesium  was filled earlier today and has been shipped to patient. He should receive tomorrow.   Potassium is improving as well - was 3.4 on 10/21/2024 - up from 3.2   Objective:  Lab Results  Component Value Date   HGBA1C 8.4 (H) 09/02/2024    Lab Results  Component Value Date   CREATININE 1.15 10/21/2024   BUN  14 10/21/2024   NA 140 10/21/2024   K 3.4 (L) 10/21/2024   CL 103 10/21/2024   CO2 28 10/21/2024    Lab Results  Component Value Date   CHOL 118 09/02/2024   HDL 49.50 09/02/2024   LDLCALC 54 09/02/2024   TRIG 74.0 09/02/2024   CHOLHDL 2 09/02/2024   Current Outpatient Medications  Medication Instructions   Aspirin  Low Dose 81 mg, Oral, Daily   atorvastatin  (LIPITOR) 40 mg, Oral, Daily at bedtime   Continuous Glucose Sensor (FREESTYLE LIBRE 3 SENSOR) MISC Place 1 sensor on the skin every 14 days. Use to check glucose continuously   ezetimibe  (ZETIA ) 10 mg, Oral, Daily    fluticasone  (FLONASE ) 50 MCG/ACT nasal spray 1-2 sprays, Each Nare, Daily   glipiZIDE  (GLUCOTROL ) 5 mg, Oral, 2 times daily before meals   Insulin  Pen Needle (UNIFINE PENTIPS) 32G X 4 MM MISC Use daily as directed   Lantus  SoloStar 8 Units, Subcutaneous, Daily   losartan  (COZAAR ) 100 mg, Oral, Daily   Magnesium  Chloride-Calcium  (SLOW MAGNESIUM /CALCIUM ) 64-106 MG TBEC 1 tablet, Oral, 2 times daily   metFORMIN  (GLUCOPHAGE ) 850 mg, Oral, 2 times daily with meals   NIFEdipine  (PROCARDIA  XL/NIFEDICAL-XL) 90 mg, Oral, Daily   potassium chloride  (KLOR-CON  M) 10 MEQ tablet 40 mEq, Oral, 2 times daily   Trulicity  4.5 mg, Injection, Weekly     Assessment/Plan:  Diabetes: - Last A1c was not at goal but recent Continuous Glucose Monitor report shows improved blood glucose with occasional lows - Recommend he continue to hold Lantus  - Recommend he hold glipizide  too since he recently had lows.  - Continue Trulicity  to 4.5mg  weekly - Continue metformin  850mg  twice a day - Continue to use Libre 3 + sensors to check blood glucose continuously - left patient 2 samples of Libre 3 plus sensors. - reviewed how to treat low blood glucose.  Medication Management:  - called [facility] to check on magnesium  and potassium prescription. Prescription was filled and has been shipped. Patient should receive tomorrow.  - Requested updated Rx for nifedipine  from PCP - Coordinated with pharmacy for Trulicity  4.5mg  refill and delivery - has 1 refill remaining. Reminded patient he has appointment with PCP 11/25/2024  Follow Up Plan: 2 to 3 weeks   Madelin Ray, PharmD Clinical Pharmacist [facility] Primary Care  Population Health [phone]

## 2024-11-13 ENCOUNTER — Other Ambulatory Visit: Payer: Self-pay

## 2024-11-25 ENCOUNTER — Encounter: Payer: Self-pay | Admitting: Family Medicine

## 2024-11-25 ENCOUNTER — Other Ambulatory Visit (HOSPITAL_COMMUNITY): Payer: Self-pay

## 2024-11-25 ENCOUNTER — Other Ambulatory Visit: Payer: Self-pay

## 2024-11-25 ENCOUNTER — Ambulatory Visit: Admitting: Family Medicine

## 2024-11-25 DIAGNOSIS — E1165 Type 2 diabetes mellitus with hyperglycemia: Secondary | ICD-10-CM

## 2024-11-25 DIAGNOSIS — E785 Hyperlipidemia, unspecified: Secondary | ICD-10-CM

## 2024-11-25 DIAGNOSIS — I10 Essential (primary) hypertension: Secondary | ICD-10-CM

## 2024-11-25 DIAGNOSIS — E876 Hypokalemia: Secondary | ICD-10-CM

## 2024-11-25 MED ORDER — ATORVASTATIN CALCIUM 40 MG PO TABS
40.0000 mg | ORAL_TABLET | Freq: Every day | ORAL | 1 refills | Status: AC
  Filled 2024-11-25: qty 90, 90d supply, fill #0

## 2024-11-25 MED ORDER — TRULICITY 4.5 MG/0.5ML ~~LOC~~ SOAJ
4.5000 mg | SUBCUTANEOUS | 3 refills | Status: DC
  Filled 2024-11-25: qty 2, 28d supply, fill #0

## 2024-11-25 MED ORDER — POTASSIUM CHLORIDE CRYS ER 10 MEQ PO TBCR
40.0000 meq | EXTENDED_RELEASE_TABLET | Freq: Two times a day (BID) | ORAL | 1 refills | Status: AC
  Filled 2024-11-25 – 2024-12-20 (×2): qty 240, 30d supply, fill #0

## 2024-11-25 MED ORDER — SLOW MAGNESIUM/CALCIUM 64-106 MG PO TBEC
1.0000 | DELAYED_RELEASE_TABLET | Freq: Two times a day (BID) | ORAL | 3 refills | Status: AC
  Filled 2024-11-25 – 2024-12-20 (×2): qty 60, 30d supply, fill #0

## 2024-11-25 MED ORDER — METFORMIN HCL 850 MG PO TABS
850.0000 mg | ORAL_TABLET | Freq: Two times a day (BID) | ORAL | 1 refills | Status: AC
  Filled 2024-11-25: qty 180, 90d supply, fill #0

## 2024-11-25 MED ORDER — LOSARTAN POTASSIUM 100 MG PO TABS
100.0000 mg | ORAL_TABLET | Freq: Every day | ORAL | 1 refills | Status: AC
  Filled 2024-11-25: qty 90, 90d supply, fill #0

## 2024-11-25 MED ORDER — EZETIMIBE 10 MG PO TABS
10.0000 mg | ORAL_TABLET | Freq: Every day | ORAL | 1 refills | Status: AC
  Filled 2024-11-25: qty 90, 90d supply, fill #0

## 2024-11-25 MED ORDER — NIFEDIPINE ER OSMOTIC RELEASE 90 MG PO TB24
90.0000 mg | ORAL_TABLET | Freq: Every day | ORAL | 1 refills | Status: AC
  Filled 2024-11-25: qty 90, 90d supply, fill #0

## 2024-11-25 NOTE — Progress Notes (Signed)
 Subjective:  Patient ID: Dylan Abbott, male    DOB: 10-Nov-1966  Age: 58 y.o. MRN: [id number]  CC:  Chief Complaint  Patient presents with   Follow-up    1 month follow up hypomagnesemia.     HPI Ndrew Creason presents for   Hypomagnesemia with hypokalemia See prior visits.  Adjustment of medication doses and some difficulty obtaining medicines at times.  Appreciate assistance from clinical pharmacist.  Medications were shipped November 25. At his last visit, had been off potassium for 1 week, had refills. 40meq BID.  Taking magnesium  past week and a half - BID Postassium 40meq BID - consistent past week and a half.   Diabetes: Complicated by hyperglycemia, cvd,. On statin and ARB.  Visit with pharmacist noted 11/25.  Prior Lantus , glipizide  have been on hold due to lows.  Was continued on Trulicity  4.5 mg weekly, metformin  850 mg twice daily, CGM for monitoring.  87% time in range 70-180.  1% 54-69 2% greater than 250  Lab Results  Component Value Date   HGBA1C 8.4 (H) 09/02/2024   HGBA1C 11.4 (H) 05/20/2024   HGBA1C 8.6 (H) 02/08/2024   Lab Results  Component Value Date   MICROALBUR 1.6 06/24/2024   LDLCALC 54 09/02/2024   CREATININE 1.15 10/21/2024   Flu vaccine, covid booster, shingles vaccines declined.    History Patient Active Problem List   Diagnosis Date Noted   NSTEMI (non-ST elevated myocardial infarction) (HCC) 07/16/2023   Impingement syndrome of left shoulder 07/30/2020   COVID-19 virus infection 10/31/2019   Abnormal liver function 10/31/2019   Hypokalemia 10/31/2019   Hyponatremia 10/31/2019   Diabetes mellitus (HCC) 11/13/2017   Hyperlipidemia 11/13/2017   Hypertensive disorder 11/13/2017   Past Medical History:  Diagnosis Date   Diabetes mellitus without complication (HCC)    Hyperlipidemia    Hypertension    Past Surgical History:  Procedure Laterality Date   LOOP RECORDER INSERTION N/A 07/18/2023   Procedure: LOOP RECORDER  INSERTION;  Surgeon: Lesia Ozell Barter, PA-C;  Location: [facility];  Service: Cardiovascular;  Laterality: N/A;   No Known Allergies Prior to Admission medications   Medication Sig Start Date End Date Taking? Authorizing Provider  aspirin  EC 81 MG tablet Take 1 tablet (81 mg total) by mouth daily. 07/19/24  Yes Levora Reyes SAUNDERS, MD  atorvastatin  (LIPITOR) 40 MG tablet Take 1 tablet (40 mg total) by mouth at bedtime. 09/02/24  Yes Levora Reyes SAUNDERS, MD  Continuous Glucose Sensor (FREESTYLE LIBRE 3 SENSOR) MISC Place 1 sensor on the skin every 14 days. Use to check glucose continuously 07/08/24  Yes Levora Reyes SAUNDERS, MD  Dulaglutide  (TRULICITY ) 4.5 MG/0.5ML SOAJ Inject 4.5 mg as directed once a week. 10/17/24  Yes Levora Reyes SAUNDERS, MD  ezetimibe  (ZETIA ) 10 MG tablet Take 1 tablet (10 mg total) by mouth daily. 09/02/24  Yes Levora Reyes SAUNDERS, MD  fluticasone  (FLONASE ) 50 MCG/ACT nasal spray Place 1-2 sprays into both nostrils daily. 03/01/24  Yes Levora Reyes SAUNDERS, MD  glipiZIDE  (GLUCOTROL ) 10 MG tablet Take 0.5 tablets (5 mg total) by mouth 2 (two) times daily before a meal. 09/02/24  Yes Levora Reyes SAUNDERS, MD  insulin  glargine (LANTUS ) 100 UNIT/ML Solostar Pen Inject 8 Units into the skin daily. 09/02/24  Yes Levora Reyes SAUNDERS, MD  Insulin  Pen Needle (UNIFINE PENTIPS) 32G X 4 MM MISC Use daily as directed 09/02/24  Yes Levora Reyes SAUNDERS, MD  losartan  (COZAAR ) 100 MG tablet Take 1 tablet (100  mg total) by mouth daily. 09/02/24  Yes Levora Reyes SAUNDERS, MD  Magnesium  Chloride-Calcium  (SLOW MAGNESIUM /CALCIUM ) 64-106 MG TBEC Take 1 tablet by mouth 2 (two) times daily. 10/22/24  Yes Levora Reyes SAUNDERS, MD  metFORMIN  (GLUCOPHAGE ) 850 MG tablet Take 1 tablet (850 mg total) by mouth 2 (two) times daily with a meal. 09/02/24  Yes Levora Reyes SAUNDERS, MD  NIFEdipine  (PROCARDIA  XL/NIFEDICAL-XL) 90 MG 24 hr tablet Take 1 tablet (90 mg total) by mouth daily. 11/12/24  Yes Levora Reyes SAUNDERS, MD  potassium  chloride (KLOR-CON  M) 10 MEQ tablet Take 4 tablets (40 mEq total) by mouth 2 (two) times daily. 09/02/24  Yes Levora Reyes SAUNDERS, MD   Social History   Socioeconomic History   Marital status: Married    Spouse name: Not on file   Number of children: 2   Years of education: Not on file   Highest education level: Not on file  Occupational History   Occupation: environmental  Tobacco Use   Smoking status: Never   Smokeless tobacco: Never  Substance and Sexual Activity   Alcohol use: Never    Alcohol/week: 0.0 standard drinks of alcohol   Drug use: Never   Sexual activity: Yes  Other Topics Concern   Not on file  Social History Narrative   Not on file   Social Drivers of Health   Financial Resource Strain: Low Risk  (05/20/2024)   Overall Financial Resource Strain (CARDIA)    Difficulty of Paying Living Expenses: Not hard at all  Food Insecurity: No Food Insecurity (07/17/2023)   Hunger Vital Sign    Worried About Running Out of Food in the Last Year: Never true    Ran Out of Food in the Last Year: Never true  Transportation Needs: No Transportation Needs (07/17/2023)   PRAPARE - Administrator, Civil Service (Medical): No    Lack of Transportation (Non-Medical): No  Physical Activity: Inactive (05/20/2024)   Exercise Vital Sign    Days of Exercise per Week: 0 days    Minutes of Exercise per Session: 0 min  Stress: No Stress Concern Present (05/20/2024)   [employer] of Occupational Health - Occupational Stress Questionnaire    Feeling of Stress : Not at all  Social Connections: Not on file  Intimate Partner Violence: Not At Risk (07/17/2023)   Humiliation, Afraid, Rape, and Kick questionnaire    Fear of Current or Ex-Partner: No    Emotionally Abused: No    Physically Abused: No    Sexually Abused: No    Review of Systems  Constitutional:  Negative for fatigue and unexpected weight change.  Respiratory:  Negative for cough, chest tightness and shortness of  breath.   Cardiovascular:  Negative for chest pain, palpitations and leg swelling.  Gastrointestinal:  Negative for abdominal pain and blood in stool.  Neurological:  Negative for dizziness, light-headedness and headaches.     Objective:   Vitals:   11/25/24 1349  BP: 112/72  Pulse: 83  Resp: 18  Temp: 98.7 F (37.1 C)  TempSrc: Temporal  SpO2: 99%  Weight: 225 lb 3.2 oz (102.2 kg)  Height: 6' (1.829 m)     Physical Exam Vitals reviewed.  Constitutional:      Appearance: He is well-developed.  HENT:     Head: Normocephalic and atraumatic.  Neck:     Vascular: No carotid bruit or JVD.  Cardiovascular:     Rate and Rhythm: Normal rate and regular rhythm.  Heart sounds: Normal heart sounds. No murmur heard. Pulmonary:     Effort: Pulmonary effort is normal.     Breath sounds: Normal breath sounds. No rales.  Musculoskeletal:     Right lower leg: No edema.     Left lower leg: No edema.  Skin:    General: Skin is warm and dry.  Neurological:     Mental Status: He is alert and oriented to person, place, and time.  Psychiatric:        Mood and Affect: Mood normal.        Assessment & Plan:  Tomio Kirk is a 58 y.o. male . Hyperlipidemia, unspecified hyperlipidemia type - Plan: atorvastatin  (LIPITOR) 40 MG tablet, ezetimibe  (ZETIA ) 10 MG tablet  - Tolerating current meds, plan for full labs at his January appointment.  Refills ordered for now.  Essential hypertension - Plan: losartan  (COZAAR ) 100 MG tablet  - Stable with current regimen, continue same.  Refills ordered, full labs in January with diabetes recheck.  Type 2 diabetes mellitus with hyperglycemia, without long-term current use of insulin  (HCC) - Plan: metFORMIN  (GLUCOPHAGE ) 850 MG tablet  - Recent control noted, improved, continue to hold off on Lantus  or glipizide .  Continue Trulicity , metformin .  A1c at January appointment.  Appreciate assistance from pharmacist.  Hypokalemia - Plan:  potassium chloride  (KLOR-CON  M) 10 MEQ tablet Hypomagnesemia - Plan: Magnesium  Chloride-Calcium  (SLOW MAGNESIUM /CALCIUM ) 64-106 MG TBEC  - Reports consistent dosing of potassium and magnesium  past week and a half.  Check labs and adjust regimen accordingly.  Verified that he has refills of meds.  Meds ordered this encounter  Medications   atorvastatin  (LIPITOR) 40 MG tablet    Sig: Take 1 tablet (40 mg total) by mouth at bedtime.    Dispense:  90 tablet    Refill:  1   ezetimibe  (ZETIA ) 10 MG tablet    Sig: Take 1 tablet (10 mg total) by mouth daily.    Dispense:  90 tablet    Refill:  1   Dulaglutide  (TRULICITY ) 4.5 MG/0.5ML SOAJ    Sig: Inject 4.5 mg as directed once a week.    Dispense:  2 mL    Refill:  3   losartan  (COZAAR ) 100 MG tablet    Sig: Take 1 tablet (100 mg total) by mouth daily.    Dispense:  90 tablet    Refill:  1   metFORMIN  (GLUCOPHAGE ) 850 MG tablet    Sig: Take 1 tablet (850 mg total) by mouth 2 (two) times daily with a meal.    Dispense:  180 tablet    Refill:  1   NIFEdipine  (PROCARDIA  XL/NIFEDICAL-XL) 90 MG 24 hr tablet    Sig: Take 1 tablet (90 mg total) by mouth daily.    Dispense:  90 tablet    Refill:  1   potassium chloride  (KLOR-CON  M) 10 MEQ tablet    Sig: Take 4 tablets (40 mEq total) by mouth 2 (two) times daily.    Dispense:  240 tablet    Refill:  1   Magnesium  Chloride-Calcium  (SLOW MAGNESIUM /CALCIUM ) 64-106 MG TBEC    Sig: Take 1 tablet by mouth 2 (two) times daily.    Dispense:  60 tablet    Refill:  3   Patient Instructions  No change in medications at this time.  I will check your magnesium  and potassium levels today.  We will recheck the diabetes test in January at your appointment.  If any new concerns prior  to that time please let me know.  Keep follow-up with clinical pharmacist as planned.  Take care!    Signed,   Reyes Pines, MD [facility], [facility] 11/25/24 2:17 PM

## 2024-11-25 NOTE — Patient Instructions (Signed)
 No change in medications at this time.  I will check your magnesium  and potassium levels today.  We will recheck the diabetes test in January at your appointment.  If any new concerns prior to that time please let me know.  Keep follow-up with clinical pharmacist as planned.  Take care!

## 2024-11-26 ENCOUNTER — Other Ambulatory Visit (HOSPITAL_COMMUNITY): Payer: Self-pay

## 2024-11-26 ENCOUNTER — Other Ambulatory Visit: Payer: Self-pay

## 2024-11-28 ENCOUNTER — Other Ambulatory Visit (INDEPENDENT_AMBULATORY_CARE_PROVIDER_SITE_OTHER): Payer: Self-pay | Admitting: Pharmacist

## 2024-11-28 DIAGNOSIS — E119 Type 2 diabetes mellitus without complications: Secondary | ICD-10-CM

## 2024-11-28 NOTE — Progress Notes (Signed)
 09/13/2024 Name: Dylan Abbott MRN: [id number] DOB: Dec 07, 1966  Chief Complaint  Patient presents with   Diabetes   Medication Management    Dylan Abbott is a 59 y.o. year old male who presented for a phone visit   They were referred to the pharmacist by their PCP for assistance in managing diabetes, medication access, and complex medication management.    Subjective:  Medication Access/Adherence  We have tried to get prior authorization for the [facility] but was initial prior authorization and appeal were both denied.  (number [id number] for [employer] / [employer] Rx services)  His plan covers DexCom G6 but his phone is not compatible with their app. Also not compatible with G7 app.   Current Pharmacy:  [name] - [facility] [address] [place] [address] Phone: [phone] Fax: [phone]   Patient reports affordability concerns with their medications: Yes  - only when he has not met deductible.   Patient reports access/transportation concerns to their pharmacy: No  Patient reports adherence concerns with their medications:  No    Diabetes:  Current medications:   metformin  850mg  twice a day Trulicity  4.5mg  weekly - started this does November 9th, 2025  Over the last 2 months we have been able to increase Trulicity  dose and stop Lantus  and glipizide .   Patient reports he eats 2 meals a day - one at 4pm prior to leaving for work and another at 2am when he is at work,.   Exercise - none currently, has an active job.   CGM Documentation: 11/15/2024 to 11/28/2024 Days Worn: 14 (recommend 14 days) % Time CGM is active: 90% (goal >=70%) Average Glucose: 120 mg/dL Glucose Management Indicator: 6.2% Glucose Variability: 31.2% (goal <36%) Time in Range:  - Time above range >250: 1% (typical goal: <5%) - Time above range 181-250: 6% (typical goal <20%) - Time in range 70-180: 90% (typical goal >=70%) - Time below range  54-69: 3% (typical goal <4%) - Time below range: 0% (typical goal <1%)        Wt Readings from Last 3 Encounters:  11/25/24 225 lb 3.2 oz (102.2 kg)  10/07/24 233 lb 6.4 oz (105.9 kg)  09/02/24 229 lb 9.6 oz (104.1 kg)    Magnesium  on 09/02/2024 was 1.1; checked 10/21/2024 and was slightly improved at 1.3; Dylan Abbott increase magnesium  supplement to take 1 tablet twice a day. Patient has received Rx for magnesium  and has been taking fo rthe last 2 weeks.    Potassium is improving as well - was 3.4 on 10/21/2024 - up from 3.2  Patient was seen by Dylan Abbott 11/25/2024 and it looked like labs were ordered and drawn but not resulted yet. Per patient today he did not have labs drawn at the 12/8 appointment which might be why there are not results.    Objective:  Lab Results  Component Value Date   HGBA1C 8.4 (H) 09/02/2024    Lab Results  Component Value Date   CREATININE 1.15 10/21/2024   BUN 14 10/21/2024   NA 140 10/21/2024   K 3.4 (L) 10/21/2024   CL 103 10/21/2024   CO2 28 10/21/2024    Lab Results  Component Value Date   CHOL 118 09/02/2024   HDL 49.50 09/02/2024   LDLCALC 54 09/02/2024   TRIG 74.0 09/02/2024   CHOLHDL 2 09/02/2024   Current Outpatient Medications  Medication Instructions   Aspirin  Low Dose 81 mg, Oral, Daily   atorvastatin  (LIPITOR)  40 mg, Oral, Daily at bedtime   Continuous Glucose Sensor (FREESTYLE LIBRE 3 SENSOR) MISC Place 1 sensor on the skin every 14 days. Use to check glucose continuously   ezetimibe  (ZETIA ) 10 mg, Oral, Daily   fluticasone  (FLONASE ) 50 MCG/ACT nasal spray 1-2 sprays, Each Nare, Daily   Insulin  Pen Needle (UNIFINE PENTIPS) 32G X 4 MM MISC Use daily as directed   losartan  (COZAAR ) 100 mg, Oral, Daily   Magnesium  Chloride-Calcium  (SLOW MAGNESIUM /CALCIUM ) 64-106 MG TBEC 1 tablet, Oral, 2 times daily   metFORMIN  (GLUCOPHAGE ) 850 mg, Oral, 2 times daily with meals   NIFEdipine  (PROCARDIA  XL/NIFEDICAL-XL) 90 mg, Oral, Daily    potassium chloride  (KLOR-CON  M) 10 MEQ tablet 40 mEq, Oral, 2 times daily   Trulicity  4.5 mg, Injection, Weekly     Assessment/Plan:  Diabetes: - Last A1c was not at goal but recent Continuous Glucose Monitor report shows GMI of 6.2%  - Continue Trulicity  to 4.5mg  weekly - Continue metformin  850mg  twice a day - Continue to use Libre 3 + sensors to check blood glucose continuously  - reviewed how to treat low blood glucose.  - Will check with PCP about plans to recheck BMET and magnesium  and also when to recheck A1c (could check as early as 12/02/2024)  Dylan Abbott, PharmD Clinical Pharmacist [facility]  Population Health [phone]

## 2024-12-04 ENCOUNTER — Telehealth: Payer: Self-pay

## 2024-12-04 DIAGNOSIS — E876 Hypokalemia: Secondary | ICD-10-CM

## 2024-12-04 NOTE — Telephone Encounter (Signed)
 Called patient and he would like to go to [facility] to get his labs drawn as a walk in. It is closer to his house and more convenient for him.   Future labs ordered

## 2024-12-04 NOTE — Addendum Note (Signed)
 Addended by: HONOR BERN A on: 12/04/2024 11:22 AM   Modules accepted: Orders

## 2024-12-04 NOTE — Telephone Encounter (Signed)
-----   Message from [facility] D sent at 12/03/2024  8:39 AM EST ----- Regarding: RE: labs 2nd attempt in trying to reach patient. Left vm to return call. Patient will need to schedule a lab only visit to get labs. Sending as an update ----- Message ----- From: Honor Alfredo LABOR, CMA Sent: 12/02/2024   8:19 AM EST To: Reyes JONELLE Pines, MD; [facility] Subject: labs                                           We tried to reach patient several times to get his labs drawn from last visit. See result notes. I will call patient later today and schedule a lab visit ----- Message ----- From: Pines Reyes JONELLE, MD Sent: 11/29/2024   3:10 PM EST To: [facility]  Please check status of labs on 12/8 - appears they were collected, but not resulted.  Thanks.  If he did not have lab work drawn, please reorder BMP, magnesium  as a future order and have him collect that either at our office or [facility].  Thank you. ----- Message ----- From: Carla Milling, RPH-CPP Sent: 11/28/2024   2:46 PM EST To: Reyes JONELLE Pines, MD  Mr. Darko said he did not have any labs drawn 12/8 but there is a pending BMET and magnesium . Did you want him to have these rechecked?

## 2024-12-05 ENCOUNTER — Other Ambulatory Visit

## 2024-12-05 DIAGNOSIS — E876 Hypokalemia: Secondary | ICD-10-CM

## 2024-12-05 LAB — BASIC METABOLIC PANEL WITH GFR
BUN: 14 mg/dL (ref 6–23)
CO2: 26 meq/L (ref 19–32)
Calcium: 9.6 mg/dL (ref 8.4–10.5)
Chloride: 99 meq/L (ref 96–112)
Creatinine, Ser: 1.15 mg/dL (ref 0.40–1.50)
GFR: 69.8 mL/min (ref >60.00)
Glucose, Bld: 152 mg/dL — ABNORMAL HIGH (ref 70–99)
Potassium: 3 meq/L — ABNORMAL LOW (ref 3.5–5.1)
Sodium: 139 meq/L (ref 135–145)

## 2024-12-05 LAB — MAGNESIUM: Magnesium: 1.4 mg/dL — ABNORMAL LOW (ref 1.5–2.5)

## 2024-12-06 ENCOUNTER — Other Ambulatory Visit (HOSPITAL_COMMUNITY): Payer: Self-pay

## 2024-12-06 ENCOUNTER — Other Ambulatory Visit: Payer: Self-pay

## 2024-12-06 ENCOUNTER — Telehealth: Payer: Self-pay | Admitting: Pharmacist

## 2024-12-06 MED ORDER — TRULICITY 4.5 MG/0.5ML ~~LOC~~ SOAJ
4.5000 mg | SUBCUTANEOUS | 0 refills | Status: AC
  Filled 2024-12-06: qty 6, 84d supply, fill #0

## 2024-12-06 NOTE — Telephone Encounter (Signed)
 Ordered

## 2024-12-06 NOTE — Telephone Encounter (Signed)
 Patient is requesting refill for 84 days for Trulicity . Last filled 11/18/2024 for 28 days - he has 1 dose remaining.  Pended Rx for Trulicity  4.5mg  for 84 DS.

## 2024-12-09 ENCOUNTER — Ambulatory Visit: Payer: Self-pay | Admitting: Family Medicine

## 2024-12-09 ENCOUNTER — Ambulatory Visit: Admitting: Pharmacist

## 2024-12-09 DIAGNOSIS — E876 Hypokalemia: Secondary | ICD-10-CM

## 2024-12-09 DIAGNOSIS — E1165 Type 2 diabetes mellitus with hyperglycemia: Secondary | ICD-10-CM | POA: Diagnosis not present

## 2024-12-10 ENCOUNTER — Other Ambulatory Visit: Payer: Self-pay | Admitting: Pharmacist

## 2024-12-11 NOTE — Telephone Encounter (Signed)
 Called patient. Will increase to 60meq potassium BID for now, repeat labs next week. Pended.

## 2024-12-15 NOTE — Progress Notes (Signed)
 "  12/09/2024 Name: Dylan Abbott MRN: [id number] DOB: Apr 07, 1966  Chief Complaint  Patient presents with   Diabetes    Srihan Brutus is a 58 y.o. year old male who presented for an office visit   They were referred to the pharmacist by their PCP for assistance in managing diabetes, medication access, and complex medication management.    Subjective:  Patient requested assistance with [facility] application on his phone and Continuous Glucose Monitor system.   Medication Access/Adherence  We have tried to get prior authorization for the [facility] but was initial prior authorization and appeal were both denied.  (number [id number] for [employer] / [employer] Rx services)  His plan covers DexCom G6 but his phone is not compatible with their app. Also not compatible with G7 app.   Current Pharmacy:  [name] - [facility] [address] [place] [address] Phone: [phone] Fax: [phone]   Patient reports affordability concerns with their medications: Yes  - only when he has not met deductible.   Patient reports access/transportation concerns to their pharmacy: No  Patient reports adherence concerns with their medications:  No    Diabetes:  Current medications:   metformin  850mg  twice a day Trulicity  4.5mg  weekly - started this does October 27, 2024  Over the last 2 months we have been able to increase Trulicity  dose and stop Lantus  and glipizide .   Patient reports he eats 2 meals a day - one at 4pm prior to leaving for work and another at 2am when he is at work,.   Exercise - none currently, has an active job.   Wt Readings from Last 3 Encounters:  11/25/24 102.2 kg (225 lb 3.2 oz)  10/07/24 105.9 kg (233 lb 6.4 oz)  09/02/24 104.1 kg (229 lb 9.6 oz)     Objective:  Lab Results  Component Value Date   HGBA1C 8.4 (H) 09/02/2024    Lab Results  Component Value Date   CREATININE 1.15 12/05/2024   BUN 14 12/05/2024   NA 139  12/05/2024   K 3.0 (L) 12/05/2024   CL 99 12/05/2024   CO2 26 12/05/2024    Lab Results  Component Value Date   CHOL 118 09/02/2024   HDL 49.50 09/02/2024   LDLCALC 54 09/02/2024   TRIG 74.0 09/02/2024   CHOLHDL 2 09/02/2024   Current Outpatient Medications  Medication Instructions   Aspirin  Low Dose 81 mg, Oral, Daily   atorvastatin  (LIPITOR) 40 mg, Oral, Daily at bedtime   Continuous Glucose Sensor (FREESTYLE LIBRE 3 SENSOR) MISC Place 1 sensor on the skin every 14 days. Use to check glucose continuously   ezetimibe  (ZETIA ) 10 mg, Oral, Daily   fluticasone  (FLONASE ) 50 MCG/ACT nasal spray 1-2 sprays, Each Nare, Daily   Insulin  Pen Needle (UNIFINE PENTIPS) 32G X 4 MM MISC Use daily as directed   losartan  (COZAAR ) 100 mg, Oral, Daily   Magnesium  Chloride-Calcium  (SLOW MAGNESIUM /CALCIUM ) 64-106 MG TBEC 1 tablet, Oral, 2 times daily   metFORMIN  (GLUCOPHAGE ) 850 mg, Oral, 2 times daily with meals   NIFEdipine  (PROCARDIA  XL/NIFEDICAL-XL) 90 mg, Oral, Daily   potassium chloride  (KLOR-CON  M) 10 MEQ tablet 40 mEq, Oral, 2 times daily   Trulicity  4.5 mg, Injection, Weekly     Assessment/Plan:  Diabetes: - Last A1c was not at goal but recent Continuous Glucose Monitor report shows GMI of 6.2%  - Continue Trulicity  to 4.5mg  weekly - Continue metformin  850mg  twice a day - Continue to use  Libre 3 + sensors to check blood glucose continuously  - Assisted with downloading and setting up new [employer] app on his phone. Placed Libre 3 plus sensor and started with new app. Provided #2 Libre 3 plus sensor samples.   Coordinated with pharmacy to fill Trulicity  for 84 DS Follow up in 2 to 4 weeks.   Madelin Ray, PharmD Clinical Pharmacist [facility]  Population Health [phone]    "

## 2024-12-20 ENCOUNTER — Other Ambulatory Visit: Payer: Self-pay | Admitting: Family Medicine

## 2024-12-20 ENCOUNTER — Other Ambulatory Visit: Payer: Self-pay

## 2024-12-20 ENCOUNTER — Other Ambulatory Visit (HOSPITAL_COMMUNITY): Payer: Self-pay

## 2024-12-20 DIAGNOSIS — I10 Essential (primary) hypertension: Secondary | ICD-10-CM

## 2024-12-20 DIAGNOSIS — E1165 Type 2 diabetes mellitus with hyperglycemia: Secondary | ICD-10-CM

## 2024-12-23 ENCOUNTER — Other Ambulatory Visit (HOSPITAL_COMMUNITY): Payer: Self-pay

## 2024-12-23 ENCOUNTER — Other Ambulatory Visit: Payer: Self-pay

## 2024-12-23 MED ORDER — ASPIRIN 81 MG PO TBEC
81.0000 mg | DELAYED_RELEASE_TABLET | Freq: Every day | ORAL | 0 refills | Status: AC
  Filled 2024-12-23: qty 120, 120d supply, fill #0

## 2024-12-31 ENCOUNTER — Other Ambulatory Visit (INDEPENDENT_AMBULATORY_CARE_PROVIDER_SITE_OTHER): Payer: Self-pay | Admitting: Pharmacist

## 2024-12-31 DIAGNOSIS — Z794 Long term (current) use of insulin: Secondary | ICD-10-CM

## 2024-12-31 DIAGNOSIS — E1165 Type 2 diabetes mellitus with hyperglycemia: Secondary | ICD-10-CM

## 2024-12-31 NOTE — Progress Notes (Signed)
 "  12/09/2024 Name: Dylan Abbott MRN: [id number] DOB: 03-30-1966  Chief Complaint  Patient presents with   Diabetes    Dylan Abbott is a 59 y.o. year old male who presented for an office visit   They were referred to the pharmacist by their PCP for assistance in managing diabetes, medication access, and complex medication management.    Subjective:  Medication Access/Adherence  We have tried to get prior authorization for the [facility] but was initial prior authorization and appeal were both denied.  (number [phone] for [employer] [facility] [employer] [facility] services) Patient has a new insurance plan for 2026 but does not currently have his new card.   His previous plan covered DexCom G6 but his phone is not compatible with their app. Also not compatible with G7 app.   Current Pharmacy:  [name] - [facility] [address] [place] [address] Phone: [phone] Fax: [phone]   Patient reports affordability concerns with their medications: Yes  - only when he has not met deductible.   Patient reports access/transportation concerns to their pharmacy: No  Patient reports adherence concerns with their medications:  No    Diabetes:  Current medications:   metformin  850mg  twice a day Trulicity  4.5mg  weekly - started this does October 27, 2024  Over the last 2 months we have been able to increase Trulicity  dose and stop Lantus  and glipizide .   Patient reports he eats 2 meals a day - one at 4pm prior to leaving for work and another at 2am when he is at work,.   Exercise - none currently, has an active job.   Wt Readings from Last 3 Encounters:  11/25/24 225 lb 3.2 oz (102.2 kg)  10/07/24 233 lb 6.4 oz (105.9 kg)  09/02/24 229 lb 9.6 oz (104.1 kg)     Date of Download: 12/17/2024 thru 12/30/2024 % Time CGM is active: 42% Average Glucose: 183 mg/dL Glucose Management Indicator: 7.7%  Glucose Variability: 183 (goal <36%) Time in Goal:   - Time in range 70-180: 56%% - Time above range: 44%% - Time below range: 0%  Observed patterns: Patient is no longer having hypoglycemic episodes but blood glucose has increased with evening meal more over the last week.   He reports his sensor came off after only 6 or 7 days this last time.    Macrovascular and Microvascular Risk Reduction:  Statin? yes (atorvastatin  ); ACEi/ARB? yes (losartan ) Last urinary albumin/creatinine ratio:  Lab Results  Component Value Date   MICRALBCREAT 23.9 06/24/2024   MICRALBCREAT 15 04/24/2020   MICRALBCREAT 29.7 08/21/2018   Last eye exam:  Lab Results  Component Value Date   HMDIABEYEEXA No Retinopathy 02/22/2024   Last foot exam: 11/25/2024 Tobacco Use:  Tobacco Use: Low Risk (11/25/2024)   Patient History    Smoking Tobacco Use: Never    Smokeless Tobacco Use: Never    Passive Exposure: Not on file    Reviewed med list and last filled dates - patient has all meds on his med list on hand at home.   Objective:  Lab Results  Component Value Date   HGBA1C 8.4 (H) 09/02/2024    Lab Results  Component Value Date   CREATININE 1.15 12/05/2024   BUN 14 12/05/2024   NA 139 12/05/2024   K 3.0 (L) 12/05/2024   CL 99 12/05/2024   CO2 26 12/05/2024    Lab Results  Component Value Date   CHOL 118 09/02/2024   HDL  49.50 09/02/2024   LDLCALC 54 09/02/2024   TRIG 74.0 09/02/2024   CHOLHDL 2 09/02/2024   Current Outpatient Medications  Medication Instructions   Aspirin  Low Dose 81 mg, Oral, Daily   atorvastatin  (LIPITOR) 40 mg, Oral, Daily at bedtime   Continuous Glucose Sensor (FREESTYLE LIBRE 3 SENSOR) MISC Place 1 sensor on the skin every 14 days. Use to check glucose continuously   ezetimibe  (ZETIA ) 10 mg, Oral, Daily   fluticasone  (FLONASE ) 50 MCG/ACT nasal spray 1-2 sprays, Each Nare, Daily   Insulin  Pen Needle (UNIFINE PENTIPS) 32G X 4 MM MISC Use daily as directed   losartan  (COZAAR ) 100 mg, Oral, Daily   Magnesium   Chloride-Calcium  (SLOW MAGNESIUM /CALCIUM ) 64-106 MG TBEC 1 tablet, Oral, 2 times daily   metFORMIN  (GLUCOPHAGE ) 850 mg, Oral, 2 times daily with meals   NIFEdipine  (PROCARDIA  XL/NIFEDICAL-XL) 90 mg, Oral, Daily   potassium chloride  (KLOR-CON  M) 10 MEQ tablet 40 mEq, Oral, 2 times daily   Trulicity  4.5 mg, Injection, Weekly     Assessment/Plan:  Diabetes: - Last A1c was not at goal but recent Continuous Glucose Monitor report shows GMI of 6.2%  - Continue Trulicity  to 4.5mg  weekly - Continue metformin  850mg  twice a day - Continue to use Libre 3 + sensors to check blood glucose continuously. Provided #3 samples of LIbre 3 plus sensors. Patient will bring in his new insurance card when he received it.   Follow up in 4 weeks.   Dylan Abbott, PharmD Clinical Pharmacist [facility]  Population Health [phone]    "

## 2025-01-09 ENCOUNTER — Ambulatory Visit: Admitting: Family Medicine

## 2025-01-20 ENCOUNTER — Ambulatory Visit: Admitting: Family Medicine

## 2025-01-30 ENCOUNTER — Other Ambulatory Visit: Admitting: Pharmacist

## 2025-02-03 ENCOUNTER — Ambulatory Visit: Admitting: Family Medicine
